# Patient Record
Sex: Female | Born: 1960
Health system: Southern US, Community
[De-identification: ages and names within clinical notes are randomized; demographics above are authoritative.]

## PROBLEM LIST (undated history)

## (undated) DIAGNOSIS — L8990 Pressure ulcer of unspecified site, unspecified stage: Secondary | ICD-10-CM

## (undated) DIAGNOSIS — E782 Mixed hyperlipidemia: Secondary | ICD-10-CM

## (undated) DIAGNOSIS — E1142 Type 2 diabetes mellitus with diabetic polyneuropathy: Secondary | ICD-10-CM

## (undated) DIAGNOSIS — G35 Multiple sclerosis: Secondary | ICD-10-CM

## (undated) DIAGNOSIS — E119 Type 2 diabetes mellitus without complications: Secondary | ICD-10-CM

## (undated) DIAGNOSIS — D649 Anemia, unspecified: Secondary | ICD-10-CM

## (undated) DIAGNOSIS — C541 Malignant neoplasm of endometrium: Secondary | ICD-10-CM

## (undated) DIAGNOSIS — K219 Gastro-esophageal reflux disease without esophagitis: Secondary | ICD-10-CM

## (undated) DIAGNOSIS — G35D Multiple sclerosis, unspecified: Secondary | ICD-10-CM

## (undated) DIAGNOSIS — R12 Heartburn: Secondary | ICD-10-CM

## (undated) DIAGNOSIS — I10 Essential (primary) hypertension: Secondary | ICD-10-CM

## (undated) DIAGNOSIS — D219 Benign neoplasm of connective and other soft tissue, unspecified: Secondary | ICD-10-CM

## (undated) DIAGNOSIS — M1990 Unspecified osteoarthritis, unspecified site: Secondary | ICD-10-CM

## (undated) HISTORY — DX: Multiple sclerosis: G35

## (undated) HISTORY — PX: TUBAL LIGATION: SHX77

## (undated) HISTORY — PX: TONSILLECTOMY: SUR1361

## (undated) HISTORY — DX: Type 2 diabetes mellitus without complications: E11.9

## (undated) HISTORY — DX: Essential (primary) hypertension: I10

## (undated) HISTORY — DX: Pressure ulcer of unspecified site, unspecified stage: L89.90

## (undated) HISTORY — DX: Unspecified osteoarthritis, unspecified site: M19.90

## (undated) HISTORY — DX: Multiple sclerosis, unspecified: G35.D

## (undated) HISTORY — PX: COLONOSCOPY: SHX174

---

## 2007-12-07 HISTORY — PX: SUPRAPUBIC CATHETER INSERTION: SUR719

## 2014-10-25 DIAGNOSIS — E119 Type 2 diabetes mellitus without complications: Secondary | ICD-10-CM | POA: Diagnosis not present

## 2014-10-25 DIAGNOSIS — L89322 Pressure ulcer of left buttock, stage 2: Secondary | ICD-10-CM | POA: Diagnosis not present

## 2014-10-25 DIAGNOSIS — I10 Essential (primary) hypertension: Secondary | ICD-10-CM | POA: Diagnosis not present

## 2014-10-25 DIAGNOSIS — Z435 Encounter for attention to cystostomy: Secondary | ICD-10-CM | POA: Diagnosis not present

## 2014-10-25 DIAGNOSIS — G35 Multiple sclerosis: Secondary | ICD-10-CM | POA: Diagnosis not present

## 2014-11-21 DIAGNOSIS — G35 Multiple sclerosis: Secondary | ICD-10-CM | POA: Diagnosis not present

## 2014-11-21 DIAGNOSIS — E119 Type 2 diabetes mellitus without complications: Secondary | ICD-10-CM | POA: Diagnosis not present

## 2014-11-21 DIAGNOSIS — Z435 Encounter for attention to cystostomy: Secondary | ICD-10-CM | POA: Diagnosis not present

## 2014-11-21 DIAGNOSIS — I10 Essential (primary) hypertension: Secondary | ICD-10-CM | POA: Diagnosis not present

## 2014-11-21 DIAGNOSIS — L89322 Pressure ulcer of left buttock, stage 2: Secondary | ICD-10-CM | POA: Diagnosis not present

## 2014-12-19 DIAGNOSIS — I10 Essential (primary) hypertension: Secondary | ICD-10-CM | POA: Diagnosis not present

## 2014-12-19 DIAGNOSIS — E119 Type 2 diabetes mellitus without complications: Secondary | ICD-10-CM | POA: Diagnosis not present

## 2014-12-19 DIAGNOSIS — L89322 Pressure ulcer of left buttock, stage 2: Secondary | ICD-10-CM | POA: Diagnosis not present

## 2014-12-19 DIAGNOSIS — G35 Multiple sclerosis: Secondary | ICD-10-CM | POA: Diagnosis not present

## 2014-12-19 DIAGNOSIS — Z435 Encounter for attention to cystostomy: Secondary | ICD-10-CM | POA: Diagnosis not present

## 2014-12-24 DIAGNOSIS — Z435 Encounter for attention to cystostomy: Secondary | ICD-10-CM | POA: Diagnosis not present

## 2014-12-24 DIAGNOSIS — L89322 Pressure ulcer of left buttock, stage 2: Secondary | ICD-10-CM | POA: Diagnosis not present

## 2014-12-24 DIAGNOSIS — Z993 Dependence on wheelchair: Secondary | ICD-10-CM | POA: Diagnosis not present

## 2014-12-24 DIAGNOSIS — Z6841 Body Mass Index (BMI) 40.0 and over, adult: Secondary | ICD-10-CM | POA: Diagnosis not present

## 2014-12-24 DIAGNOSIS — G35 Multiple sclerosis: Secondary | ICD-10-CM | POA: Diagnosis not present

## 2015-01-01 DIAGNOSIS — Z79899 Other long term (current) drug therapy: Secondary | ICD-10-CM | POA: Diagnosis not present

## 2015-01-01 DIAGNOSIS — Z5181 Encounter for therapeutic drug level monitoring: Secondary | ICD-10-CM | POA: Diagnosis not present

## 2015-01-01 DIAGNOSIS — G894 Chronic pain syndrome: Secondary | ICD-10-CM | POA: Diagnosis not present

## 2015-01-01 DIAGNOSIS — M4716 Other spondylosis with myelopathy, lumbar region: Secondary | ICD-10-CM | POA: Diagnosis not present

## 2015-01-08 DIAGNOSIS — K5900 Constipation, unspecified: Secondary | ICD-10-CM | POA: Diagnosis not present

## 2015-01-08 DIAGNOSIS — K219 Gastro-esophageal reflux disease without esophagitis: Secondary | ICD-10-CM | POA: Diagnosis not present

## 2015-01-08 DIAGNOSIS — K649 Unspecified hemorrhoids: Secondary | ICD-10-CM | POA: Diagnosis not present

## 2015-01-10 DIAGNOSIS — G35 Multiple sclerosis: Secondary | ICD-10-CM | POA: Diagnosis not present

## 2015-01-10 DIAGNOSIS — Z435 Encounter for attention to cystostomy: Secondary | ICD-10-CM | POA: Diagnosis not present

## 2015-01-10 DIAGNOSIS — E119 Type 2 diabetes mellitus without complications: Secondary | ICD-10-CM | POA: Diagnosis not present

## 2015-01-10 DIAGNOSIS — L89322 Pressure ulcer of left buttock, stage 2: Secondary | ICD-10-CM | POA: Diagnosis not present

## 2015-01-10 DIAGNOSIS — Z993 Dependence on wheelchair: Secondary | ICD-10-CM | POA: Diagnosis not present

## 2015-01-10 DIAGNOSIS — Z6841 Body Mass Index (BMI) 40.0 and over, adult: Secondary | ICD-10-CM | POA: Diagnosis not present

## 2015-01-13 DIAGNOSIS — E119 Type 2 diabetes mellitus without complications: Secondary | ICD-10-CM | POA: Diagnosis not present

## 2015-01-13 DIAGNOSIS — E785 Hyperlipidemia, unspecified: Secondary | ICD-10-CM | POA: Diagnosis not present

## 2015-01-13 DIAGNOSIS — I10 Essential (primary) hypertension: Secondary | ICD-10-CM | POA: Diagnosis not present

## 2015-01-16 DIAGNOSIS — Z6841 Body Mass Index (BMI) 40.0 and over, adult: Secondary | ICD-10-CM | POA: Diagnosis not present

## 2015-01-16 DIAGNOSIS — Z993 Dependence on wheelchair: Secondary | ICD-10-CM | POA: Diagnosis not present

## 2015-01-16 DIAGNOSIS — Z435 Encounter for attention to cystostomy: Secondary | ICD-10-CM | POA: Diagnosis not present

## 2015-01-16 DIAGNOSIS — G35 Multiple sclerosis: Secondary | ICD-10-CM | POA: Diagnosis not present

## 2015-01-16 DIAGNOSIS — L89322 Pressure ulcer of left buttock, stage 2: Secondary | ICD-10-CM | POA: Diagnosis not present

## 2015-01-27 DIAGNOSIS — E1129 Type 2 diabetes mellitus with other diabetic kidney complication: Secondary | ICD-10-CM | POA: Diagnosis not present

## 2015-01-27 DIAGNOSIS — E559 Vitamin D deficiency, unspecified: Secondary | ICD-10-CM | POA: Diagnosis not present

## 2015-01-27 DIAGNOSIS — I10 Essential (primary) hypertension: Secondary | ICD-10-CM | POA: Diagnosis not present

## 2015-01-27 DIAGNOSIS — E785 Hyperlipidemia, unspecified: Secondary | ICD-10-CM | POA: Diagnosis not present

## 2015-01-29 DIAGNOSIS — Z8669 Personal history of other diseases of the nervous system and sense organs: Secondary | ICD-10-CM | POA: Diagnosis not present

## 2015-01-29 DIAGNOSIS — I10 Essential (primary) hypertension: Secondary | ICD-10-CM | POA: Diagnosis not present

## 2015-01-29 DIAGNOSIS — E119 Type 2 diabetes mellitus without complications: Secondary | ICD-10-CM | POA: Diagnosis not present

## 2015-01-29 DIAGNOSIS — E876 Hypokalemia: Secondary | ICD-10-CM | POA: Diagnosis not present

## 2015-02-13 DIAGNOSIS — L89322 Pressure ulcer of left buttock, stage 2: Secondary | ICD-10-CM | POA: Diagnosis not present

## 2015-02-13 DIAGNOSIS — Z435 Encounter for attention to cystostomy: Secondary | ICD-10-CM | POA: Diagnosis not present

## 2015-02-13 DIAGNOSIS — Z6841 Body Mass Index (BMI) 40.0 and over, adult: Secondary | ICD-10-CM | POA: Diagnosis not present

## 2015-02-13 DIAGNOSIS — G35 Multiple sclerosis: Secondary | ICD-10-CM | POA: Diagnosis not present

## 2015-02-13 DIAGNOSIS — Z993 Dependence on wheelchair: Secondary | ICD-10-CM | POA: Diagnosis not present

## 2015-02-21 DIAGNOSIS — M791 Myalgia: Secondary | ICD-10-CM | POA: Diagnosis not present

## 2015-02-21 DIAGNOSIS — Z79899 Other long term (current) drug therapy: Secondary | ICD-10-CM | POA: Diagnosis not present

## 2015-02-21 DIAGNOSIS — G894 Chronic pain syndrome: Secondary | ICD-10-CM | POA: Diagnosis not present

## 2015-02-21 DIAGNOSIS — M4716 Other spondylosis with myelopathy, lumbar region: Secondary | ICD-10-CM | POA: Diagnosis not present

## 2015-02-22 DIAGNOSIS — Z993 Dependence on wheelchair: Secondary | ICD-10-CM | POA: Diagnosis not present

## 2015-02-22 DIAGNOSIS — Z6841 Body Mass Index (BMI) 40.0 and over, adult: Secondary | ICD-10-CM | POA: Diagnosis not present

## 2015-02-22 DIAGNOSIS — Z435 Encounter for attention to cystostomy: Secondary | ICD-10-CM | POA: Diagnosis not present

## 2015-02-22 DIAGNOSIS — G35 Multiple sclerosis: Secondary | ICD-10-CM | POA: Diagnosis not present

## 2015-02-22 DIAGNOSIS — L89322 Pressure ulcer of left buttock, stage 2: Secondary | ICD-10-CM | POA: Diagnosis not present

## 2015-02-26 DIAGNOSIS — E876 Hypokalemia: Secondary | ICD-10-CM | POA: Diagnosis not present

## 2015-03-17 DIAGNOSIS — Z993 Dependence on wheelchair: Secondary | ICD-10-CM | POA: Diagnosis not present

## 2015-03-17 DIAGNOSIS — G35 Multiple sclerosis: Secondary | ICD-10-CM | POA: Diagnosis not present

## 2015-03-17 DIAGNOSIS — L89322 Pressure ulcer of left buttock, stage 2: Secondary | ICD-10-CM | POA: Diagnosis not present

## 2015-03-17 DIAGNOSIS — Z435 Encounter for attention to cystostomy: Secondary | ICD-10-CM | POA: Diagnosis not present

## 2015-03-17 DIAGNOSIS — Z6841 Body Mass Index (BMI) 40.0 and over, adult: Secondary | ICD-10-CM | POA: Diagnosis not present

## 2015-03-19 DIAGNOSIS — Z79899 Other long term (current) drug therapy: Secondary | ICD-10-CM | POA: Diagnosis not present

## 2015-03-19 DIAGNOSIS — M791 Myalgia: Secondary | ICD-10-CM | POA: Diagnosis not present

## 2015-03-19 DIAGNOSIS — G894 Chronic pain syndrome: Secondary | ICD-10-CM | POA: Diagnosis not present

## 2015-03-19 DIAGNOSIS — M4716 Other spondylosis with myelopathy, lumbar region: Secondary | ICD-10-CM | POA: Diagnosis not present

## 2015-04-02 DIAGNOSIS — N319 Neuromuscular dysfunction of bladder, unspecified: Secondary | ICD-10-CM | POA: Diagnosis not present

## 2015-04-02 DIAGNOSIS — N3000 Acute cystitis without hematuria: Secondary | ICD-10-CM | POA: Diagnosis not present

## 2015-04-21 DIAGNOSIS — Z993 Dependence on wheelchair: Secondary | ICD-10-CM | POA: Diagnosis not present

## 2015-04-21 DIAGNOSIS — Z6841 Body Mass Index (BMI) 40.0 and over, adult: Secondary | ICD-10-CM | POA: Diagnosis not present

## 2015-04-21 DIAGNOSIS — Z435 Encounter for attention to cystostomy: Secondary | ICD-10-CM | POA: Diagnosis not present

## 2015-04-21 DIAGNOSIS — G35 Multiple sclerosis: Secondary | ICD-10-CM | POA: Diagnosis not present

## 2015-04-21 DIAGNOSIS — L89322 Pressure ulcer of left buttock, stage 2: Secondary | ICD-10-CM | POA: Diagnosis not present

## 2015-04-23 DIAGNOSIS — G35 Multiple sclerosis: Secondary | ICD-10-CM | POA: Diagnosis not present

## 2015-04-23 DIAGNOSIS — Z435 Encounter for attention to cystostomy: Secondary | ICD-10-CM | POA: Diagnosis not present

## 2015-04-23 DIAGNOSIS — Z6841 Body Mass Index (BMI) 40.0 and over, adult: Secondary | ICD-10-CM | POA: Diagnosis not present

## 2015-04-23 DIAGNOSIS — I10 Essential (primary) hypertension: Secondary | ICD-10-CM | POA: Diagnosis not present

## 2015-04-23 DIAGNOSIS — Z993 Dependence on wheelchair: Secondary | ICD-10-CM | POA: Diagnosis not present

## 2015-05-14 DIAGNOSIS — M791 Myalgia: Secondary | ICD-10-CM | POA: Diagnosis not present

## 2015-05-14 DIAGNOSIS — Z79899 Other long term (current) drug therapy: Secondary | ICD-10-CM | POA: Diagnosis not present

## 2015-05-14 DIAGNOSIS — G894 Chronic pain syndrome: Secondary | ICD-10-CM | POA: Diagnosis not present

## 2015-05-14 DIAGNOSIS — M4716 Other spondylosis with myelopathy, lumbar region: Secondary | ICD-10-CM | POA: Diagnosis not present

## 2015-05-16 DIAGNOSIS — R262 Difficulty in walking, not elsewhere classified: Secondary | ICD-10-CM | POA: Diagnosis not present

## 2015-05-16 DIAGNOSIS — I10 Essential (primary) hypertension: Secondary | ICD-10-CM | POA: Diagnosis not present

## 2015-05-16 DIAGNOSIS — E119 Type 2 diabetes mellitus without complications: Secondary | ICD-10-CM | POA: Diagnosis not present

## 2015-05-16 DIAGNOSIS — G35 Multiple sclerosis: Secondary | ICD-10-CM | POA: Diagnosis not present

## 2015-05-22 DIAGNOSIS — G35 Multiple sclerosis: Secondary | ICD-10-CM | POA: Diagnosis not present

## 2015-05-22 DIAGNOSIS — I10 Essential (primary) hypertension: Secondary | ICD-10-CM | POA: Diagnosis not present

## 2015-05-22 DIAGNOSIS — Z6841 Body Mass Index (BMI) 40.0 and over, adult: Secondary | ICD-10-CM | POA: Diagnosis not present

## 2015-05-22 DIAGNOSIS — Z993 Dependence on wheelchair: Secondary | ICD-10-CM | POA: Diagnosis not present

## 2015-05-22 DIAGNOSIS — Z435 Encounter for attention to cystostomy: Secondary | ICD-10-CM | POA: Diagnosis not present

## 2015-06-06 DIAGNOSIS — E119 Type 2 diabetes mellitus without complications: Secondary | ICD-10-CM | POA: Diagnosis not present

## 2015-06-06 DIAGNOSIS — I10 Essential (primary) hypertension: Secondary | ICD-10-CM | POA: Diagnosis not present

## 2015-06-06 DIAGNOSIS — E780 Pure hypercholesterolemia: Secondary | ICD-10-CM | POA: Diagnosis not present

## 2015-06-11 DIAGNOSIS — E780 Pure hypercholesterolemia: Secondary | ICD-10-CM | POA: Diagnosis not present

## 2015-06-11 DIAGNOSIS — I10 Essential (primary) hypertension: Secondary | ICD-10-CM | POA: Diagnosis not present

## 2015-06-11 DIAGNOSIS — E119 Type 2 diabetes mellitus without complications: Secondary | ICD-10-CM | POA: Diagnosis not present

## 2015-06-19 DIAGNOSIS — Z6841 Body Mass Index (BMI) 40.0 and over, adult: Secondary | ICD-10-CM | POA: Diagnosis not present

## 2015-06-19 DIAGNOSIS — Z993 Dependence on wheelchair: Secondary | ICD-10-CM | POA: Diagnosis not present

## 2015-06-19 DIAGNOSIS — G35 Multiple sclerosis: Secondary | ICD-10-CM | POA: Diagnosis not present

## 2015-06-19 DIAGNOSIS — I10 Essential (primary) hypertension: Secondary | ICD-10-CM | POA: Diagnosis not present

## 2015-06-19 DIAGNOSIS — Z435 Encounter for attention to cystostomy: Secondary | ICD-10-CM | POA: Diagnosis not present

## 2015-06-22 DIAGNOSIS — I10 Essential (primary) hypertension: Secondary | ICD-10-CM | POA: Diagnosis not present

## 2015-06-22 DIAGNOSIS — Z993 Dependence on wheelchair: Secondary | ICD-10-CM | POA: Diagnosis not present

## 2015-06-22 DIAGNOSIS — Z6841 Body Mass Index (BMI) 40.0 and over, adult: Secondary | ICD-10-CM | POA: Diagnosis not present

## 2015-06-22 DIAGNOSIS — Z435 Encounter for attention to cystostomy: Secondary | ICD-10-CM | POA: Diagnosis not present

## 2015-06-22 DIAGNOSIS — G35 Multiple sclerosis: Secondary | ICD-10-CM | POA: Diagnosis not present

## 2015-07-09 DIAGNOSIS — O2243 Hemorrhoids in pregnancy, third trimester: Secondary | ICD-10-CM | POA: Diagnosis not present

## 2015-07-09 DIAGNOSIS — K6289 Other specified diseases of anus and rectum: Secondary | ICD-10-CM | POA: Diagnosis not present

## 2015-07-09 DIAGNOSIS — K5900 Constipation, unspecified: Secondary | ICD-10-CM | POA: Diagnosis not present

## 2015-07-17 DIAGNOSIS — Z6841 Body Mass Index (BMI) 40.0 and over, adult: Secondary | ICD-10-CM | POA: Diagnosis not present

## 2015-07-17 DIAGNOSIS — I10 Essential (primary) hypertension: Secondary | ICD-10-CM | POA: Diagnosis not present

## 2015-07-17 DIAGNOSIS — Z435 Encounter for attention to cystostomy: Secondary | ICD-10-CM | POA: Diagnosis not present

## 2015-07-17 DIAGNOSIS — Z993 Dependence on wheelchair: Secondary | ICD-10-CM | POA: Diagnosis not present

## 2015-07-17 DIAGNOSIS — G35 Multiple sclerosis: Secondary | ICD-10-CM | POA: Diagnosis not present

## 2015-07-23 DIAGNOSIS — G894 Chronic pain syndrome: Secondary | ICD-10-CM | POA: Diagnosis not present

## 2015-07-23 DIAGNOSIS — Z79899 Other long term (current) drug therapy: Secondary | ICD-10-CM | POA: Diagnosis not present

## 2015-07-23 DIAGNOSIS — E119 Type 2 diabetes mellitus without complications: Secondary | ICD-10-CM | POA: Diagnosis not present

## 2015-07-23 DIAGNOSIS — E1140 Type 2 diabetes mellitus with diabetic neuropathy, unspecified: Secondary | ICD-10-CM | POA: Diagnosis not present

## 2015-07-23 DIAGNOSIS — M791 Myalgia: Secondary | ICD-10-CM | POA: Diagnosis not present

## 2015-07-23 DIAGNOSIS — M4716 Other spondylosis with myelopathy, lumbar region: Secondary | ICD-10-CM | POA: Diagnosis not present

## 2015-07-25 DIAGNOSIS — D508 Other iron deficiency anemias: Secondary | ICD-10-CM | POA: Diagnosis not present

## 2015-07-25 DIAGNOSIS — R262 Difficulty in walking, not elsewhere classified: Secondary | ICD-10-CM | POA: Diagnosis not present

## 2015-07-25 DIAGNOSIS — G35 Multiple sclerosis: Secondary | ICD-10-CM | POA: Diagnosis not present

## 2015-07-25 DIAGNOSIS — I10 Essential (primary) hypertension: Secondary | ICD-10-CM | POA: Diagnosis not present

## 2015-07-25 DIAGNOSIS — R5383 Other fatigue: Secondary | ICD-10-CM | POA: Diagnosis not present

## 2015-07-25 DIAGNOSIS — K589 Irritable bowel syndrome without diarrhea: Secondary | ICD-10-CM | POA: Diagnosis not present

## 2015-07-25 DIAGNOSIS — E119 Type 2 diabetes mellitus without complications: Secondary | ICD-10-CM | POA: Diagnosis not present

## 2015-07-30 DIAGNOSIS — M62838 Other muscle spasm: Secondary | ICD-10-CM | POA: Diagnosis not present

## 2015-07-30 DIAGNOSIS — M5137 Other intervertebral disc degeneration, lumbosacral region: Secondary | ICD-10-CM | POA: Diagnosis not present

## 2015-07-30 DIAGNOSIS — E785 Hyperlipidemia, unspecified: Secondary | ICD-10-CM | POA: Diagnosis not present

## 2015-07-30 DIAGNOSIS — I10 Essential (primary) hypertension: Secondary | ICD-10-CM | POA: Diagnosis not present

## 2015-07-30 DIAGNOSIS — G8389 Other specified paralytic syndromes: Secondary | ICD-10-CM | POA: Diagnosis not present

## 2015-07-30 DIAGNOSIS — E539 Vitamin B deficiency, unspecified: Secondary | ICD-10-CM | POA: Diagnosis not present

## 2015-07-30 DIAGNOSIS — E1140 Type 2 diabetes mellitus with diabetic neuropathy, unspecified: Secondary | ICD-10-CM | POA: Diagnosis not present

## 2015-07-30 DIAGNOSIS — E559 Vitamin D deficiency, unspecified: Secondary | ICD-10-CM | POA: Diagnosis not present

## 2015-08-04 DIAGNOSIS — M216X1 Other acquired deformities of right foot: Secondary | ICD-10-CM | POA: Diagnosis not present

## 2015-08-04 DIAGNOSIS — E1151 Type 2 diabetes mellitus with diabetic peripheral angiopathy without gangrene: Secondary | ICD-10-CM | POA: Diagnosis not present

## 2015-08-04 DIAGNOSIS — M216X2 Other acquired deformities of left foot: Secondary | ICD-10-CM | POA: Diagnosis not present

## 2015-08-13 DIAGNOSIS — L290 Pruritus ani: Secondary | ICD-10-CM | POA: Diagnosis not present

## 2015-08-19 DIAGNOSIS — Z993 Dependence on wheelchair: Secondary | ICD-10-CM | POA: Diagnosis not present

## 2015-08-19 DIAGNOSIS — Z6841 Body Mass Index (BMI) 40.0 and over, adult: Secondary | ICD-10-CM | POA: Diagnosis not present

## 2015-08-19 DIAGNOSIS — Z435 Encounter for attention to cystostomy: Secondary | ICD-10-CM | POA: Diagnosis not present

## 2015-08-19 DIAGNOSIS — G35 Multiple sclerosis: Secondary | ICD-10-CM | POA: Diagnosis not present

## 2015-08-19 DIAGNOSIS — I10 Essential (primary) hypertension: Secondary | ICD-10-CM | POA: Diagnosis not present

## 2015-08-21 DIAGNOSIS — Z435 Encounter for attention to cystostomy: Secondary | ICD-10-CM | POA: Diagnosis not present

## 2015-08-21 DIAGNOSIS — Z6841 Body Mass Index (BMI) 40.0 and over, adult: Secondary | ICD-10-CM | POA: Diagnosis not present

## 2015-08-21 DIAGNOSIS — E119 Type 2 diabetes mellitus without complications: Secondary | ICD-10-CM | POA: Diagnosis not present

## 2015-08-21 DIAGNOSIS — L89322 Pressure ulcer of left buttock, stage 2: Secondary | ICD-10-CM | POA: Diagnosis not present

## 2015-08-21 DIAGNOSIS — G35 Multiple sclerosis: Secondary | ICD-10-CM | POA: Diagnosis not present

## 2015-09-17 DIAGNOSIS — Z79899 Other long term (current) drug therapy: Secondary | ICD-10-CM | POA: Diagnosis not present

## 2015-09-17 DIAGNOSIS — M4716 Other spondylosis with myelopathy, lumbar region: Secondary | ICD-10-CM | POA: Diagnosis not present

## 2015-09-17 DIAGNOSIS — G894 Chronic pain syndrome: Secondary | ICD-10-CM | POA: Diagnosis not present

## 2015-09-17 DIAGNOSIS — M791 Myalgia: Secondary | ICD-10-CM | POA: Diagnosis not present

## 2015-09-18 DIAGNOSIS — Z6841 Body Mass Index (BMI) 40.0 and over, adult: Secondary | ICD-10-CM | POA: Diagnosis not present

## 2015-09-18 DIAGNOSIS — Z435 Encounter for attention to cystostomy: Secondary | ICD-10-CM | POA: Diagnosis not present

## 2015-09-18 DIAGNOSIS — G35 Multiple sclerosis: Secondary | ICD-10-CM | POA: Diagnosis not present

## 2015-09-18 DIAGNOSIS — E119 Type 2 diabetes mellitus without complications: Secondary | ICD-10-CM | POA: Diagnosis not present

## 2015-09-18 DIAGNOSIS — L89322 Pressure ulcer of left buttock, stage 2: Secondary | ICD-10-CM | POA: Diagnosis not present

## 2015-09-21 DIAGNOSIS — E119 Type 2 diabetes mellitus without complications: Secondary | ICD-10-CM | POA: Diagnosis not present

## 2015-09-21 DIAGNOSIS — G35 Multiple sclerosis: Secondary | ICD-10-CM | POA: Diagnosis not present

## 2015-09-21 DIAGNOSIS — Z6841 Body Mass Index (BMI) 40.0 and over, adult: Secondary | ICD-10-CM | POA: Diagnosis not present

## 2015-09-21 DIAGNOSIS — Z435 Encounter for attention to cystostomy: Secondary | ICD-10-CM | POA: Diagnosis not present

## 2015-09-21 DIAGNOSIS — N390 Urinary tract infection, site not specified: Secondary | ICD-10-CM | POA: Diagnosis not present

## 2015-09-21 DIAGNOSIS — L89322 Pressure ulcer of left buttock, stage 2: Secondary | ICD-10-CM | POA: Diagnosis not present

## 2015-10-14 DIAGNOSIS — E119 Type 2 diabetes mellitus without complications: Secondary | ICD-10-CM | POA: Diagnosis not present

## 2015-10-14 DIAGNOSIS — H2513 Age-related nuclear cataract, bilateral: Secondary | ICD-10-CM | POA: Diagnosis not present

## 2015-10-16 DIAGNOSIS — E119 Type 2 diabetes mellitus without complications: Secondary | ICD-10-CM | POA: Diagnosis not present

## 2015-10-16 DIAGNOSIS — G35 Multiple sclerosis: Secondary | ICD-10-CM | POA: Diagnosis not present

## 2015-10-16 DIAGNOSIS — Z435 Encounter for attention to cystostomy: Secondary | ICD-10-CM | POA: Diagnosis not present

## 2015-10-16 DIAGNOSIS — L89322 Pressure ulcer of left buttock, stage 2: Secondary | ICD-10-CM | POA: Diagnosis not present

## 2015-10-16 DIAGNOSIS — Z6841 Body Mass Index (BMI) 40.0 and over, adult: Secondary | ICD-10-CM | POA: Diagnosis not present

## 2015-10-20 DIAGNOSIS — Z435 Encounter for attention to cystostomy: Secondary | ICD-10-CM | POA: Diagnosis not present

## 2015-10-20 DIAGNOSIS — E119 Type 2 diabetes mellitus without complications: Secondary | ICD-10-CM | POA: Diagnosis not present

## 2015-10-20 DIAGNOSIS — L89322 Pressure ulcer of left buttock, stage 2: Secondary | ICD-10-CM | POA: Diagnosis not present

## 2015-10-20 DIAGNOSIS — Z6841 Body Mass Index (BMI) 40.0 and over, adult: Secondary | ICD-10-CM | POA: Diagnosis not present

## 2015-10-20 DIAGNOSIS — I10 Essential (primary) hypertension: Secondary | ICD-10-CM | POA: Diagnosis not present

## 2015-10-22 DIAGNOSIS — Z1231 Encounter for screening mammogram for malignant neoplasm of breast: Secondary | ICD-10-CM | POA: Diagnosis not present

## 2015-10-31 DIAGNOSIS — Z79899 Other long term (current) drug therapy: Secondary | ICD-10-CM | POA: Diagnosis not present

## 2015-10-31 DIAGNOSIS — I10 Essential (primary) hypertension: Secondary | ICD-10-CM | POA: Diagnosis not present

## 2015-10-31 DIAGNOSIS — J00 Acute nasopharyngitis [common cold]: Secondary | ICD-10-CM | POA: Diagnosis not present

## 2015-10-31 DIAGNOSIS — R030 Elevated blood-pressure reading, without diagnosis of hypertension: Secondary | ICD-10-CM | POA: Diagnosis not present

## 2015-11-03 DIAGNOSIS — E1140 Type 2 diabetes mellitus with diabetic neuropathy, unspecified: Secondary | ICD-10-CM | POA: Diagnosis not present

## 2015-11-10 DIAGNOSIS — E785 Hyperlipidemia, unspecified: Secondary | ICD-10-CM | POA: Diagnosis not present

## 2015-11-10 DIAGNOSIS — E1140 Type 2 diabetes mellitus with diabetic neuropathy, unspecified: Secondary | ICD-10-CM | POA: Diagnosis not present

## 2015-11-10 DIAGNOSIS — I10 Essential (primary) hypertension: Secondary | ICD-10-CM | POA: Diagnosis not present

## 2015-11-10 DIAGNOSIS — E559 Vitamin D deficiency, unspecified: Secondary | ICD-10-CM | POA: Diagnosis not present

## 2015-11-14 DIAGNOSIS — Z79899 Other long term (current) drug therapy: Secondary | ICD-10-CM | POA: Diagnosis not present

## 2015-11-14 DIAGNOSIS — M791 Myalgia: Secondary | ICD-10-CM | POA: Diagnosis not present

## 2015-11-14 DIAGNOSIS — G894 Chronic pain syndrome: Secondary | ICD-10-CM | POA: Diagnosis not present

## 2015-11-14 DIAGNOSIS — M4716 Other spondylosis with myelopathy, lumbar region: Secondary | ICD-10-CM | POA: Diagnosis not present

## 2015-11-17 DIAGNOSIS — E119 Type 2 diabetes mellitus without complications: Secondary | ICD-10-CM | POA: Diagnosis not present

## 2015-11-17 DIAGNOSIS — L89322 Pressure ulcer of left buttock, stage 2: Secondary | ICD-10-CM | POA: Diagnosis not present

## 2015-11-17 DIAGNOSIS — Z6841 Body Mass Index (BMI) 40.0 and over, adult: Secondary | ICD-10-CM | POA: Diagnosis not present

## 2015-11-17 DIAGNOSIS — Z435 Encounter for attention to cystostomy: Secondary | ICD-10-CM | POA: Diagnosis not present

## 2015-11-17 DIAGNOSIS — I10 Essential (primary) hypertension: Secondary | ICD-10-CM | POA: Diagnosis not present

## 2015-11-18 DIAGNOSIS — G8220 Paraplegia, unspecified: Secondary | ICD-10-CM | POA: Diagnosis not present

## 2015-11-18 DIAGNOSIS — Z4689 Encounter for fitting and adjustment of other specified devices: Secondary | ICD-10-CM | POA: Diagnosis not present

## 2015-12-17 DIAGNOSIS — I739 Peripheral vascular disease, unspecified: Secondary | ICD-10-CM | POA: Diagnosis not present

## 2015-12-18 DIAGNOSIS — I10 Essential (primary) hypertension: Secondary | ICD-10-CM | POA: Diagnosis not present

## 2015-12-18 DIAGNOSIS — Z435 Encounter for attention to cystostomy: Secondary | ICD-10-CM | POA: Diagnosis not present

## 2015-12-18 DIAGNOSIS — E119 Type 2 diabetes mellitus without complications: Secondary | ICD-10-CM | POA: Diagnosis not present

## 2015-12-18 DIAGNOSIS — L89322 Pressure ulcer of left buttock, stage 2: Secondary | ICD-10-CM | POA: Diagnosis not present

## 2015-12-18 DIAGNOSIS — Z6841 Body Mass Index (BMI) 40.0 and over, adult: Secondary | ICD-10-CM | POA: Diagnosis not present

## 2015-12-19 DIAGNOSIS — Z6841 Body Mass Index (BMI) 40.0 and over, adult: Secondary | ICD-10-CM | POA: Diagnosis not present

## 2015-12-19 DIAGNOSIS — I10 Essential (primary) hypertension: Secondary | ICD-10-CM | POA: Diagnosis not present

## 2015-12-19 DIAGNOSIS — L89322 Pressure ulcer of left buttock, stage 2: Secondary | ICD-10-CM | POA: Diagnosis not present

## 2015-12-19 DIAGNOSIS — Z435 Encounter for attention to cystostomy: Secondary | ICD-10-CM | POA: Diagnosis not present

## 2015-12-19 DIAGNOSIS — E119 Type 2 diabetes mellitus without complications: Secondary | ICD-10-CM | POA: Diagnosis not present

## 2016-01-07 DIAGNOSIS — Z79899 Other long term (current) drug therapy: Secondary | ICD-10-CM | POA: Diagnosis not present

## 2016-01-07 DIAGNOSIS — G894 Chronic pain syndrome: Secondary | ICD-10-CM | POA: Diagnosis not present

## 2016-01-07 DIAGNOSIS — M4716 Other spondylosis with myelopathy, lumbar region: Secondary | ICD-10-CM | POA: Diagnosis not present

## 2016-01-07 DIAGNOSIS — M791 Myalgia: Secondary | ICD-10-CM | POA: Diagnosis not present

## 2016-01-14 DIAGNOSIS — K649 Unspecified hemorrhoids: Secondary | ICD-10-CM | POA: Diagnosis not present

## 2016-01-14 DIAGNOSIS — E1140 Type 2 diabetes mellitus with diabetic neuropathy, unspecified: Secondary | ICD-10-CM | POA: Diagnosis not present

## 2016-01-14 DIAGNOSIS — G35 Multiple sclerosis: Secondary | ICD-10-CM | POA: Diagnosis not present

## 2016-01-14 DIAGNOSIS — K5900 Constipation, unspecified: Secondary | ICD-10-CM | POA: Diagnosis not present

## 2016-01-20 DIAGNOSIS — R300 Dysuria: Secondary | ICD-10-CM | POA: Diagnosis not present

## 2016-01-20 DIAGNOSIS — Z6841 Body Mass Index (BMI) 40.0 and over, adult: Secondary | ICD-10-CM | POA: Diagnosis not present

## 2016-01-20 DIAGNOSIS — Z435 Encounter for attention to cystostomy: Secondary | ICD-10-CM | POA: Diagnosis not present

## 2016-01-20 DIAGNOSIS — N390 Urinary tract infection, site not specified: Secondary | ICD-10-CM | POA: Diagnosis not present

## 2016-01-20 DIAGNOSIS — E119 Type 2 diabetes mellitus without complications: Secondary | ICD-10-CM | POA: Diagnosis not present

## 2016-01-20 DIAGNOSIS — L89322 Pressure ulcer of left buttock, stage 2: Secondary | ICD-10-CM | POA: Diagnosis not present

## 2016-01-20 DIAGNOSIS — I10 Essential (primary) hypertension: Secondary | ICD-10-CM | POA: Diagnosis not present

## 2016-01-28 DIAGNOSIS — E1140 Type 2 diabetes mellitus with diabetic neuropathy, unspecified: Secondary | ICD-10-CM | POA: Diagnosis not present

## 2016-02-04 DIAGNOSIS — M62838 Other muscle spasm: Secondary | ICD-10-CM | POA: Diagnosis not present

## 2016-02-04 DIAGNOSIS — G8389 Other specified paralytic syndromes: Secondary | ICD-10-CM | POA: Diagnosis not present

## 2016-02-04 DIAGNOSIS — E1149 Type 2 diabetes mellitus with other diabetic neurological complication: Secondary | ICD-10-CM | POA: Diagnosis not present

## 2016-02-04 DIAGNOSIS — G35 Multiple sclerosis: Secondary | ICD-10-CM | POA: Diagnosis not present

## 2016-02-13 DIAGNOSIS — E119 Type 2 diabetes mellitus without complications: Secondary | ICD-10-CM | POA: Diagnosis not present

## 2016-02-13 DIAGNOSIS — L89322 Pressure ulcer of left buttock, stage 2: Secondary | ICD-10-CM | POA: Diagnosis not present

## 2016-02-13 DIAGNOSIS — I10 Essential (primary) hypertension: Secondary | ICD-10-CM | POA: Diagnosis not present

## 2016-02-13 DIAGNOSIS — Z435 Encounter for attention to cystostomy: Secondary | ICD-10-CM | POA: Diagnosis not present

## 2016-02-13 DIAGNOSIS — Z6841 Body Mass Index (BMI) 40.0 and over, adult: Secondary | ICD-10-CM | POA: Diagnosis not present

## 2016-02-16 DIAGNOSIS — E559 Vitamin D deficiency, unspecified: Secondary | ICD-10-CM | POA: Diagnosis not present

## 2016-02-16 DIAGNOSIS — E1140 Type 2 diabetes mellitus with diabetic neuropathy, unspecified: Secondary | ICD-10-CM | POA: Diagnosis not present

## 2016-02-16 DIAGNOSIS — E785 Hyperlipidemia, unspecified: Secondary | ICD-10-CM | POA: Diagnosis not present

## 2016-02-16 DIAGNOSIS — I10 Essential (primary) hypertension: Secondary | ICD-10-CM | POA: Diagnosis not present

## 2016-02-17 DIAGNOSIS — Z6841 Body Mass Index (BMI) 40.0 and over, adult: Secondary | ICD-10-CM | POA: Diagnosis not present

## 2016-02-17 DIAGNOSIS — I10 Essential (primary) hypertension: Secondary | ICD-10-CM | POA: Diagnosis not present

## 2016-02-17 DIAGNOSIS — Z435 Encounter for attention to cystostomy: Secondary | ICD-10-CM | POA: Diagnosis not present

## 2016-02-17 DIAGNOSIS — L89321 Pressure ulcer of left buttock, stage 1: Secondary | ICD-10-CM | POA: Diagnosis not present

## 2016-02-17 DIAGNOSIS — E119 Type 2 diabetes mellitus without complications: Secondary | ICD-10-CM | POA: Diagnosis not present

## 2016-03-03 DIAGNOSIS — I10 Essential (primary) hypertension: Secondary | ICD-10-CM | POA: Diagnosis not present

## 2016-03-03 DIAGNOSIS — G35 Multiple sclerosis: Secondary | ICD-10-CM | POA: Diagnosis not present

## 2016-03-03 DIAGNOSIS — E119 Type 2 diabetes mellitus without complications: Secondary | ICD-10-CM | POA: Diagnosis not present

## 2016-03-03 DIAGNOSIS — R300 Dysuria: Secondary | ICD-10-CM | POA: Diagnosis not present

## 2016-03-03 DIAGNOSIS — D649 Anemia, unspecified: Secondary | ICD-10-CM | POA: Diagnosis not present

## 2016-03-04 DIAGNOSIS — N390 Urinary tract infection, site not specified: Secondary | ICD-10-CM | POA: Diagnosis not present

## 2016-03-15 DIAGNOSIS — L89321 Pressure ulcer of left buttock, stage 1: Secondary | ICD-10-CM | POA: Diagnosis not present

## 2016-03-15 DIAGNOSIS — I10 Essential (primary) hypertension: Secondary | ICD-10-CM | POA: Diagnosis not present

## 2016-03-15 DIAGNOSIS — Z6841 Body Mass Index (BMI) 40.0 and over, adult: Secondary | ICD-10-CM | POA: Diagnosis not present

## 2016-03-15 DIAGNOSIS — G35 Multiple sclerosis: Secondary | ICD-10-CM | POA: Diagnosis not present

## 2016-03-15 DIAGNOSIS — Z435 Encounter for attention to cystostomy: Secondary | ICD-10-CM | POA: Diagnosis not present

## 2016-03-15 DIAGNOSIS — E119 Type 2 diabetes mellitus without complications: Secondary | ICD-10-CM | POA: Diagnosis not present

## 2016-03-18 DIAGNOSIS — I10 Essential (primary) hypertension: Secondary | ICD-10-CM | POA: Diagnosis not present

## 2016-03-18 DIAGNOSIS — E876 Hypokalemia: Secondary | ICD-10-CM | POA: Diagnosis not present

## 2016-03-18 DIAGNOSIS — E7800 Pure hypercholesterolemia, unspecified: Secondary | ICD-10-CM | POA: Diagnosis not present

## 2016-03-18 DIAGNOSIS — J302 Other seasonal allergic rhinitis: Secondary | ICD-10-CM | POA: Diagnosis not present

## 2016-03-18 DIAGNOSIS — G35 Multiple sclerosis: Secondary | ICD-10-CM | POA: Diagnosis not present

## 2016-03-18 DIAGNOSIS — E119 Type 2 diabetes mellitus without complications: Secondary | ICD-10-CM | POA: Diagnosis not present

## 2016-03-24 DIAGNOSIS — G894 Chronic pain syndrome: Secondary | ICD-10-CM | POA: Diagnosis not present

## 2016-03-24 DIAGNOSIS — M4716 Other spondylosis with myelopathy, lumbar region: Secondary | ICD-10-CM | POA: Diagnosis not present

## 2016-03-24 DIAGNOSIS — M791 Myalgia: Secondary | ICD-10-CM | POA: Diagnosis not present

## 2016-03-24 DIAGNOSIS — Z79899 Other long term (current) drug therapy: Secondary | ICD-10-CM | POA: Diagnosis not present

## 2016-03-31 DIAGNOSIS — I739 Peripheral vascular disease, unspecified: Secondary | ICD-10-CM | POA: Diagnosis not present

## 2016-04-16 DIAGNOSIS — E119 Type 2 diabetes mellitus without complications: Secondary | ICD-10-CM | POA: Diagnosis not present

## 2016-04-16 DIAGNOSIS — I10 Essential (primary) hypertension: Secondary | ICD-10-CM | POA: Diagnosis not present

## 2016-04-16 DIAGNOSIS — Z435 Encounter for attention to cystostomy: Secondary | ICD-10-CM | POA: Diagnosis not present

## 2016-04-16 DIAGNOSIS — Z6841 Body Mass Index (BMI) 40.0 and over, adult: Secondary | ICD-10-CM | POA: Diagnosis not present

## 2016-04-16 DIAGNOSIS — L89321 Pressure ulcer of left buttock, stage 1: Secondary | ICD-10-CM | POA: Diagnosis not present

## 2016-04-17 DIAGNOSIS — I10 Essential (primary) hypertension: Secondary | ICD-10-CM | POA: Diagnosis not present

## 2016-04-17 DIAGNOSIS — E119 Type 2 diabetes mellitus without complications: Secondary | ICD-10-CM | POA: Diagnosis not present

## 2016-04-17 DIAGNOSIS — Z435 Encounter for attention to cystostomy: Secondary | ICD-10-CM | POA: Diagnosis not present

## 2016-04-17 DIAGNOSIS — L89321 Pressure ulcer of left buttock, stage 1: Secondary | ICD-10-CM | POA: Diagnosis not present

## 2016-04-17 DIAGNOSIS — Z6841 Body Mass Index (BMI) 40.0 and over, adult: Secondary | ICD-10-CM | POA: Diagnosis not present

## 2016-04-20 DIAGNOSIS — N319 Neuromuscular dysfunction of bladder, unspecified: Secondary | ICD-10-CM | POA: Diagnosis not present

## 2016-04-20 DIAGNOSIS — N3941 Urge incontinence: Secondary | ICD-10-CM | POA: Diagnosis not present

## 2016-04-20 DIAGNOSIS — N390 Urinary tract infection, site not specified: Secondary | ICD-10-CM | POA: Diagnosis not present

## 2016-04-23 DIAGNOSIS — I10 Essential (primary) hypertension: Secondary | ICD-10-CM | POA: Diagnosis not present

## 2016-04-23 DIAGNOSIS — J3089 Other allergic rhinitis: Secondary | ICD-10-CM | POA: Diagnosis not present

## 2016-04-23 DIAGNOSIS — E876 Hypokalemia: Secondary | ICD-10-CM | POA: Diagnosis not present

## 2016-04-23 DIAGNOSIS — G35 Multiple sclerosis: Secondary | ICD-10-CM | POA: Diagnosis not present

## 2016-05-07 DIAGNOSIS — Z435 Encounter for attention to cystostomy: Secondary | ICD-10-CM | POA: Diagnosis not present

## 2016-05-07 DIAGNOSIS — E119 Type 2 diabetes mellitus without complications: Secondary | ICD-10-CM | POA: Diagnosis not present

## 2016-05-07 DIAGNOSIS — L89321 Pressure ulcer of left buttock, stage 1: Secondary | ICD-10-CM | POA: Diagnosis not present

## 2016-05-07 DIAGNOSIS — Z6841 Body Mass Index (BMI) 40.0 and over, adult: Secondary | ICD-10-CM | POA: Diagnosis not present

## 2016-05-07 DIAGNOSIS — I10 Essential (primary) hypertension: Secondary | ICD-10-CM | POA: Diagnosis not present

## 2016-05-18 DIAGNOSIS — E119 Type 2 diabetes mellitus without complications: Secondary | ICD-10-CM | POA: Diagnosis not present

## 2016-05-18 DIAGNOSIS — E1140 Type 2 diabetes mellitus with diabetic neuropathy, unspecified: Secondary | ICD-10-CM | POA: Diagnosis not present

## 2016-05-18 DIAGNOSIS — I10 Essential (primary) hypertension: Secondary | ICD-10-CM | POA: Diagnosis not present

## 2016-05-18 DIAGNOSIS — Z6841 Body Mass Index (BMI) 40.0 and over, adult: Secondary | ICD-10-CM | POA: Diagnosis not present

## 2016-05-18 DIAGNOSIS — Z435 Encounter for attention to cystostomy: Secondary | ICD-10-CM | POA: Diagnosis not present

## 2016-05-18 DIAGNOSIS — L89321 Pressure ulcer of left buttock, stage 1: Secondary | ICD-10-CM | POA: Diagnosis not present

## 2016-05-19 DIAGNOSIS — M791 Myalgia: Secondary | ICD-10-CM | POA: Diagnosis not present

## 2016-05-19 DIAGNOSIS — E1140 Type 2 diabetes mellitus with diabetic neuropathy, unspecified: Secondary | ICD-10-CM | POA: Diagnosis not present

## 2016-05-19 DIAGNOSIS — E559 Vitamin D deficiency, unspecified: Secondary | ICD-10-CM | POA: Diagnosis not present

## 2016-05-19 DIAGNOSIS — M4716 Other spondylosis with myelopathy, lumbar region: Secondary | ICD-10-CM | POA: Diagnosis not present

## 2016-05-19 DIAGNOSIS — G894 Chronic pain syndrome: Secondary | ICD-10-CM | POA: Diagnosis not present

## 2016-05-19 DIAGNOSIS — I10 Essential (primary) hypertension: Secondary | ICD-10-CM | POA: Diagnosis not present

## 2016-05-19 DIAGNOSIS — E785 Hyperlipidemia, unspecified: Secondary | ICD-10-CM | POA: Diagnosis not present

## 2016-05-19 DIAGNOSIS — Z79899 Other long term (current) drug therapy: Secondary | ICD-10-CM | POA: Diagnosis not present

## 2016-05-31 DIAGNOSIS — N390 Urinary tract infection, site not specified: Secondary | ICD-10-CM | POA: Diagnosis not present

## 2016-05-31 DIAGNOSIS — R300 Dysuria: Secondary | ICD-10-CM | POA: Diagnosis not present

## 2016-06-02 DIAGNOSIS — K649 Unspecified hemorrhoids: Secondary | ICD-10-CM | POA: Diagnosis not present

## 2016-06-02 DIAGNOSIS — K589 Irritable bowel syndrome without diarrhea: Secondary | ICD-10-CM | POA: Diagnosis not present

## 2016-06-02 DIAGNOSIS — K5900 Constipation, unspecified: Secondary | ICD-10-CM | POA: Diagnosis not present

## 2016-06-15 DIAGNOSIS — L89321 Pressure ulcer of left buttock, stage 1: Secondary | ICD-10-CM | POA: Diagnosis not present

## 2016-06-15 DIAGNOSIS — Z435 Encounter for attention to cystostomy: Secondary | ICD-10-CM | POA: Diagnosis not present

## 2016-06-15 DIAGNOSIS — I10 Essential (primary) hypertension: Secondary | ICD-10-CM | POA: Diagnosis not present

## 2016-06-15 DIAGNOSIS — Z6841 Body Mass Index (BMI) 40.0 and over, adult: Secondary | ICD-10-CM | POA: Diagnosis not present

## 2016-06-15 DIAGNOSIS — E119 Type 2 diabetes mellitus without complications: Secondary | ICD-10-CM | POA: Diagnosis not present

## 2016-06-16 DIAGNOSIS — Z6841 Body Mass Index (BMI) 40.0 and over, adult: Secondary | ICD-10-CM | POA: Diagnosis not present

## 2016-06-16 DIAGNOSIS — Z435 Encounter for attention to cystostomy: Secondary | ICD-10-CM | POA: Diagnosis not present

## 2016-06-16 DIAGNOSIS — E119 Type 2 diabetes mellitus without complications: Secondary | ICD-10-CM | POA: Diagnosis not present

## 2016-06-16 DIAGNOSIS — L89322 Pressure ulcer of left buttock, stage 2: Secondary | ICD-10-CM | POA: Diagnosis not present

## 2016-06-16 DIAGNOSIS — N898 Other specified noninflammatory disorders of vagina: Secondary | ICD-10-CM | POA: Diagnosis not present

## 2016-06-16 DIAGNOSIS — I10 Essential (primary) hypertension: Secondary | ICD-10-CM | POA: Diagnosis not present

## 2016-06-17 DIAGNOSIS — I10 Essential (primary) hypertension: Secondary | ICD-10-CM | POA: Diagnosis not present

## 2016-06-17 DIAGNOSIS — R300 Dysuria: Secondary | ICD-10-CM | POA: Diagnosis not present

## 2016-06-17 DIAGNOSIS — L89322 Pressure ulcer of left buttock, stage 2: Secondary | ICD-10-CM | POA: Diagnosis not present

## 2016-06-17 DIAGNOSIS — E119 Type 2 diabetes mellitus without complications: Secondary | ICD-10-CM | POA: Diagnosis not present

## 2016-06-17 DIAGNOSIS — N390 Urinary tract infection, site not specified: Secondary | ICD-10-CM | POA: Diagnosis not present

## 2016-06-17 DIAGNOSIS — Z435 Encounter for attention to cystostomy: Secondary | ICD-10-CM | POA: Diagnosis not present

## 2016-06-17 DIAGNOSIS — Z6841 Body Mass Index (BMI) 40.0 and over, adult: Secondary | ICD-10-CM | POA: Diagnosis not present

## 2016-07-05 DIAGNOSIS — R102 Pelvic and perineal pain: Secondary | ICD-10-CM | POA: Diagnosis not present

## 2016-07-05 DIAGNOSIS — N3941 Urge incontinence: Secondary | ICD-10-CM | POA: Diagnosis not present

## 2016-07-05 DIAGNOSIS — N319 Neuromuscular dysfunction of bladder, unspecified: Secondary | ICD-10-CM | POA: Diagnosis not present

## 2016-07-05 DIAGNOSIS — N390 Urinary tract infection, site not specified: Secondary | ICD-10-CM | POA: Diagnosis not present

## 2016-07-15 DIAGNOSIS — Z6841 Body Mass Index (BMI) 40.0 and over, adult: Secondary | ICD-10-CM | POA: Diagnosis not present

## 2016-07-15 DIAGNOSIS — I10 Essential (primary) hypertension: Secondary | ICD-10-CM | POA: Diagnosis not present

## 2016-07-15 DIAGNOSIS — L89322 Pressure ulcer of left buttock, stage 2: Secondary | ICD-10-CM | POA: Diagnosis not present

## 2016-07-15 DIAGNOSIS — E119 Type 2 diabetes mellitus without complications: Secondary | ICD-10-CM | POA: Diagnosis not present

## 2016-07-15 DIAGNOSIS — Z435 Encounter for attention to cystostomy: Secondary | ICD-10-CM | POA: Diagnosis not present

## 2016-07-16 DIAGNOSIS — M791 Myalgia: Secondary | ICD-10-CM | POA: Diagnosis not present

## 2016-07-16 DIAGNOSIS — G894 Chronic pain syndrome: Secondary | ICD-10-CM | POA: Diagnosis not present

## 2016-07-16 DIAGNOSIS — Z79899 Other long term (current) drug therapy: Secondary | ICD-10-CM | POA: Diagnosis not present

## 2016-07-16 DIAGNOSIS — M4716 Other spondylosis with myelopathy, lumbar region: Secondary | ICD-10-CM | POA: Diagnosis not present

## 2016-07-20 DIAGNOSIS — I10 Essential (primary) hypertension: Secondary | ICD-10-CM | POA: Diagnosis not present

## 2016-07-20 DIAGNOSIS — E7800 Pure hypercholesterolemia, unspecified: Secondary | ICD-10-CM | POA: Diagnosis not present

## 2016-07-20 DIAGNOSIS — Z435 Encounter for attention to cystostomy: Secondary | ICD-10-CM | POA: Diagnosis not present

## 2016-07-20 DIAGNOSIS — E119 Type 2 diabetes mellitus without complications: Secondary | ICD-10-CM | POA: Diagnosis not present

## 2016-07-20 DIAGNOSIS — L89322 Pressure ulcer of left buttock, stage 2: Secondary | ICD-10-CM | POA: Diagnosis not present

## 2016-07-20 DIAGNOSIS — Z6841 Body Mass Index (BMI) 40.0 and over, adult: Secondary | ICD-10-CM | POA: Diagnosis not present

## 2016-07-21 DIAGNOSIS — D259 Leiomyoma of uterus, unspecified: Secondary | ICD-10-CM | POA: Diagnosis not present

## 2016-07-21 DIAGNOSIS — N852 Hypertrophy of uterus: Secondary | ICD-10-CM | POA: Diagnosis not present

## 2016-07-21 DIAGNOSIS — N319 Neuromuscular dysfunction of bladder, unspecified: Secondary | ICD-10-CM | POA: Diagnosis not present

## 2016-07-21 DIAGNOSIS — N390 Urinary tract infection, site not specified: Secondary | ICD-10-CM | POA: Diagnosis not present

## 2016-07-21 DIAGNOSIS — R311 Benign essential microscopic hematuria: Secondary | ICD-10-CM | POA: Diagnosis not present

## 2016-07-26 DIAGNOSIS — E119 Type 2 diabetes mellitus without complications: Secondary | ICD-10-CM | POA: Diagnosis not present

## 2016-07-26 DIAGNOSIS — G35 Multiple sclerosis: Secondary | ICD-10-CM | POA: Diagnosis not present

## 2016-07-26 DIAGNOSIS — I10 Essential (primary) hypertension: Secondary | ICD-10-CM | POA: Diagnosis not present

## 2016-07-26 DIAGNOSIS — D649 Anemia, unspecified: Secondary | ICD-10-CM | POA: Diagnosis not present

## 2016-07-26 DIAGNOSIS — K219 Gastro-esophageal reflux disease without esophagitis: Secondary | ICD-10-CM | POA: Diagnosis not present

## 2016-07-30 DIAGNOSIS — R300 Dysuria: Secondary | ICD-10-CM | POA: Diagnosis not present

## 2016-07-30 DIAGNOSIS — Z435 Encounter for attention to cystostomy: Secondary | ICD-10-CM | POA: Diagnosis not present

## 2016-07-30 DIAGNOSIS — Z6841 Body Mass Index (BMI) 40.0 and over, adult: Secondary | ICD-10-CM | POA: Diagnosis not present

## 2016-07-30 DIAGNOSIS — L89322 Pressure ulcer of left buttock, stage 2: Secondary | ICD-10-CM | POA: Diagnosis not present

## 2016-07-30 DIAGNOSIS — E119 Type 2 diabetes mellitus without complications: Secondary | ICD-10-CM | POA: Diagnosis not present

## 2016-07-30 DIAGNOSIS — N390 Urinary tract infection, site not specified: Secondary | ICD-10-CM | POA: Diagnosis not present

## 2016-07-30 DIAGNOSIS — I10 Essential (primary) hypertension: Secondary | ICD-10-CM | POA: Diagnosis not present

## 2016-08-04 DIAGNOSIS — N319 Neuromuscular dysfunction of bladder, unspecified: Secondary | ICD-10-CM | POA: Diagnosis not present

## 2016-08-04 DIAGNOSIS — N390 Urinary tract infection, site not specified: Secondary | ICD-10-CM | POA: Diagnosis not present

## 2016-08-04 DIAGNOSIS — I739 Peripheral vascular disease, unspecified: Secondary | ICD-10-CM | POA: Diagnosis not present

## 2016-08-12 DIAGNOSIS — I10 Essential (primary) hypertension: Secondary | ICD-10-CM | POA: Diagnosis not present

## 2016-08-12 DIAGNOSIS — L89322 Pressure ulcer of left buttock, stage 2: Secondary | ICD-10-CM | POA: Diagnosis not present

## 2016-08-12 DIAGNOSIS — E119 Type 2 diabetes mellitus without complications: Secondary | ICD-10-CM | POA: Diagnosis not present

## 2016-08-12 DIAGNOSIS — Z6841 Body Mass Index (BMI) 40.0 and over, adult: Secondary | ICD-10-CM | POA: Diagnosis not present

## 2016-08-12 DIAGNOSIS — Z435 Encounter for attention to cystostomy: Secondary | ICD-10-CM | POA: Diagnosis not present

## 2016-08-15 DIAGNOSIS — E119 Type 2 diabetes mellitus without complications: Secondary | ICD-10-CM | POA: Diagnosis not present

## 2016-08-15 DIAGNOSIS — Z6841 Body Mass Index (BMI) 40.0 and over, adult: Secondary | ICD-10-CM | POA: Diagnosis not present

## 2016-08-15 DIAGNOSIS — Z435 Encounter for attention to cystostomy: Secondary | ICD-10-CM | POA: Diagnosis not present

## 2016-08-15 DIAGNOSIS — L89312 Pressure ulcer of right buttock, stage 2: Secondary | ICD-10-CM | POA: Diagnosis not present

## 2016-08-15 DIAGNOSIS — I10 Essential (primary) hypertension: Secondary | ICD-10-CM | POA: Diagnosis not present

## 2016-08-20 DIAGNOSIS — Z01419 Encounter for gynecological examination (general) (routine) without abnormal findings: Secondary | ICD-10-CM | POA: Diagnosis not present

## 2016-08-20 DIAGNOSIS — Z124 Encounter for screening for malignant neoplasm of cervix: Secondary | ICD-10-CM | POA: Diagnosis not present

## 2016-08-25 DIAGNOSIS — D509 Iron deficiency anemia, unspecified: Secondary | ICD-10-CM | POA: Diagnosis not present

## 2016-08-25 DIAGNOSIS — K5900 Constipation, unspecified: Secondary | ICD-10-CM | POA: Diagnosis not present

## 2016-08-25 DIAGNOSIS — R110 Nausea: Secondary | ICD-10-CM | POA: Diagnosis not present

## 2016-08-25 DIAGNOSIS — K219 Gastro-esophageal reflux disease without esophagitis: Secondary | ICD-10-CM | POA: Diagnosis not present

## 2016-08-27 DIAGNOSIS — E119 Type 2 diabetes mellitus without complications: Secondary | ICD-10-CM | POA: Diagnosis not present

## 2016-08-27 DIAGNOSIS — Z6841 Body Mass Index (BMI) 40.0 and over, adult: Secondary | ICD-10-CM | POA: Diagnosis not present

## 2016-08-27 DIAGNOSIS — I10 Essential (primary) hypertension: Secondary | ICD-10-CM | POA: Diagnosis not present

## 2016-08-27 DIAGNOSIS — Z435 Encounter for attention to cystostomy: Secondary | ICD-10-CM | POA: Diagnosis not present

## 2016-08-27 DIAGNOSIS — L89312 Pressure ulcer of right buttock, stage 2: Secondary | ICD-10-CM | POA: Diagnosis not present

## 2016-09-09 DIAGNOSIS — Z6841 Body Mass Index (BMI) 40.0 and over, adult: Secondary | ICD-10-CM | POA: Diagnosis not present

## 2016-09-09 DIAGNOSIS — L89312 Pressure ulcer of right buttock, stage 2: Secondary | ICD-10-CM | POA: Diagnosis not present

## 2016-09-09 DIAGNOSIS — E119 Type 2 diabetes mellitus without complications: Secondary | ICD-10-CM | POA: Diagnosis not present

## 2016-09-09 DIAGNOSIS — Z435 Encounter for attention to cystostomy: Secondary | ICD-10-CM | POA: Diagnosis not present

## 2016-09-09 DIAGNOSIS — I10 Essential (primary) hypertension: Secondary | ICD-10-CM | POA: Diagnosis not present

## 2016-09-15 DIAGNOSIS — M791 Myalgia: Secondary | ICD-10-CM | POA: Diagnosis not present

## 2016-09-15 DIAGNOSIS — G894 Chronic pain syndrome: Secondary | ICD-10-CM | POA: Diagnosis not present

## 2016-09-15 DIAGNOSIS — M4716 Other spondylosis with myelopathy, lumbar region: Secondary | ICD-10-CM | POA: Diagnosis not present

## 2016-09-15 DIAGNOSIS — Z79899 Other long term (current) drug therapy: Secondary | ICD-10-CM | POA: Diagnosis not present

## 2016-09-16 DIAGNOSIS — E1140 Type 2 diabetes mellitus with diabetic neuropathy, unspecified: Secondary | ICD-10-CM | POA: Diagnosis not present

## 2016-09-16 DIAGNOSIS — Z435 Encounter for attention to cystostomy: Secondary | ICD-10-CM | POA: Diagnosis not present

## 2016-09-16 DIAGNOSIS — I10 Essential (primary) hypertension: Secondary | ICD-10-CM | POA: Diagnosis not present

## 2016-09-16 DIAGNOSIS — Z6841 Body Mass Index (BMI) 40.0 and over, adult: Secondary | ICD-10-CM | POA: Diagnosis not present

## 2016-09-16 DIAGNOSIS — E119 Type 2 diabetes mellitus without complications: Secondary | ICD-10-CM | POA: Diagnosis not present

## 2016-09-16 DIAGNOSIS — L89312 Pressure ulcer of right buttock, stage 2: Secondary | ICD-10-CM | POA: Diagnosis not present

## 2016-09-22 DIAGNOSIS — E785 Hyperlipidemia, unspecified: Secondary | ICD-10-CM | POA: Diagnosis not present

## 2016-09-22 DIAGNOSIS — E559 Vitamin D deficiency, unspecified: Secondary | ICD-10-CM | POA: Diagnosis not present

## 2016-09-22 DIAGNOSIS — E1140 Type 2 diabetes mellitus with diabetic neuropathy, unspecified: Secondary | ICD-10-CM | POA: Diagnosis not present

## 2016-09-22 DIAGNOSIS — I10 Essential (primary) hypertension: Secondary | ICD-10-CM | POA: Diagnosis not present

## 2016-10-20 ENCOUNTER — Ambulatory Visit (INDEPENDENT_AMBULATORY_CARE_PROVIDER_SITE_OTHER): Payer: Medicare Other | Admitting: Family Medicine

## 2016-10-20 ENCOUNTER — Encounter: Payer: Self-pay | Admitting: Family Medicine

## 2016-10-20 VITALS — BP 126/82 | HR 95 | Temp 98.4°F | Resp 18

## 2016-10-20 DIAGNOSIS — Z9289 Personal history of other medical treatment: Secondary | ICD-10-CM | POA: Diagnosis not present

## 2016-10-20 DIAGNOSIS — Z978 Presence of other specified devices: Secondary | ICD-10-CM

## 2016-10-20 DIAGNOSIS — M150 Primary generalized (osteo)arthritis: Secondary | ICD-10-CM

## 2016-10-20 DIAGNOSIS — I10 Essential (primary) hypertension: Secondary | ICD-10-CM

## 2016-10-20 DIAGNOSIS — M159 Polyosteoarthritis, unspecified: Secondary | ICD-10-CM

## 2016-10-20 DIAGNOSIS — Z79891 Long term (current) use of opiate analgesic: Secondary | ICD-10-CM | POA: Diagnosis not present

## 2016-10-20 DIAGNOSIS — Z960 Presence of urogenital implants: Secondary | ICD-10-CM

## 2016-10-20 DIAGNOSIS — M8949 Other hypertrophic osteoarthropathy, multiple sites: Secondary | ICD-10-CM

## 2016-10-20 DIAGNOSIS — G35 Multiple sclerosis: Secondary | ICD-10-CM

## 2016-10-20 DIAGNOSIS — K5901 Slow transit constipation: Secondary | ICD-10-CM

## 2016-10-20 DIAGNOSIS — R8271 Bacteriuria: Secondary | ICD-10-CM

## 2016-10-20 DIAGNOSIS — E118 Type 2 diabetes mellitus with unspecified complications: Secondary | ICD-10-CM | POA: Diagnosis not present

## 2016-10-20 LAB — POCT URINALYSIS DIP (MANUAL ENTRY)
BILIRUBIN UA: NEGATIVE
GLUCOSE UA: NEGATIVE
Ketones, POC UA: NEGATIVE
Nitrite, UA: POSITIVE — AB
Protein Ur, POC: 30 — AB
SPEC GRAV UA: 1.015
Urobilinogen, UA: 0.2
pH, UA: 5.5

## 2016-10-20 MED ORDER — CIPROFLOXACIN HCL 500 MG PO TABS: 500.0000 mg | ORAL_TABLET | Freq: Two times a day (BID) | ORAL | 0 refills | Status: DC

## 2016-10-20 NOTE — Patient Instructions (Addendum)
IF you received an x-ray today, you will receive an invoice from [facility]. Please contact [facility] at [phone] with questions or concerns regarding your invoice.   IF you received labwork today, you will receive an invoice from [employer]. Please contact [facility] at [phone] with questions or concerns regarding your invoice.   Our billing staff will not be able to assist you with questions regarding bills from these companies.  You will be contacted with the lab results as soon as they are available. The fastest way to get your results is to activate your My Chart account. Instructions are located on the last page of this paperwork. If you have not heard from [place] regarding the results in 2 weeks, please contact this office.     Osteoarthritis Osteoarthritis is a type of arthritis that affects tissue that covers the ends of bones in joints (cartilage). Cartilage acts as a cushion between the bones and helps them move smoothly. Osteoarthritis results when cartilage in the joints gets worn down. Osteoarthritis is sometimes called "wear and tear" arthritis. Osteoarthritis is the most common form of arthritis. It often occurs in older people. It is a condition that gets worse over time (a progressive condition). Joints that are most often affected by this condition are in:  Fingers.  Toes.  Hips.  Knees.  Spine, including neck and lower back. What are the causes? This condition is caused by age-related wearing down of cartilage that covers the ends of bones. What increases the risk? The following factors may make you more likely to develop this condition:  Older age.  Being overweight or obese.  Overuse of joints, such as in athletes.  Past injury of a joint.  Past surgery on a joint.  Family history of osteoarthritis. What are the signs or symptoms? The main symptoms of this condition are pain, swelling, and  stiffness in the joint. The joint may lose its shape over time. Small pieces of bone or cartilage may break off and float inside of the joint, which may cause more pain and damage to the joint. Small deposits of bone (osteophytes) may grow on the edges of the joint. Other symptoms may include:  A grating or scraping feeling inside the joint when you move it.  Popping or creaking sounds when you move. Symptoms may affect one or more joints. Osteoarthritis in a major joint, such as your knee or hip, can make it painful to walk or exercise. If you have osteoarthritis in your hands, you might not be able to grip items, twist your hand, or control small movements of your hands and fingers (fine motor skills). How is this diagnosed? This condition may be diagnosed based on:  Your medical history.  A physical exam.  Your symptoms.  X-rays of the affected joint(s).  Blood tests to rule out other types of arthritis. How is this treated? There is no cure for this condition, but treatment can help to control pain and improve joint function. Treatment plans may include:  A prescribed exercise program that allows for rest and joint relief. You may work with a physical therapist.  A weight control plan.  Pain relief techniques, such as:  Applying heat and cold to the joint.  Electric pulses delivered to nerve endings under the skin (transcutaneous electrical nerve stimulation, or TENS).  Massage.  Certain nutritional supplements.  NSAIDs or prescription medicines to help relieve pain.  Medicine to help relieve pain and inflammation (corticosteroids). This  can be given by mouth (orally) or as an injection.  Assistive devices, such as a brace, wrap, splint, specialized glove, or cane.  Surgery, such as:  An osteotomy. This is done to reposition the bones and relieve pain or to remove loose pieces of bone and cartilage.  Joint replacement surgery. You may need this surgery if you have  very bad (advanced) osteoarthritis. Follow these instructions at home: Activity  Rest your affected joints as directed by your health care provider.  Do not drive or use heavy machinery while taking prescription pain medicine.  Exercise as directed. Your health care provider or physical therapist may recommend specific types of exercise, such as:  Strengthening exercises. These are done to strengthen the muscles that support joints that are affected by arthritis. They can be performed with weights or with exercise bands to add resistance.  Aerobic activities. These are exercises, such as brisk walking or water aerobics, that get your heart pumping.  Range-of-motion activities. These keep your joints easy to move.  Balance and agility exercises. Managing pain, stiffness, and swelling  If directed, apply heat to the affected area as often as told by your health care provider. Use the heat source that your health care provider recommends, such as a moist heat pack or a heating pad.  If you have a removable assistive device, remove it as told by your health care provider.  Place a towel between your skin and the heat source. If your health care provider tells you to keep the assistive device on while you apply heat, place a towel between the assistive device and the heat source.  Leave the heat on for 20-30 minutes.  Remove the heat if your skin turns bright red. This is especially important if you are unable to feel pain, heat, or cold. You may have a greater risk of getting burned.  If directed, put ice on the affected joint:  If you have a removable assistive device, remove it as told by your health care provider.  Put ice in a plastic bag.  Place a towel between your skin and the bag. If your health care provider tells you to keep the assistive device on during icing, place a towel between the assistive device and the bag.  Leave the ice on for 20 minutes, 2-3 times a  day. General instructions  Take over-the-counter and prescription medicines only as told by your health care provider.  Maintain a healthy weight. Follow instructions from your health care provider for weight control. These may include dietary restrictions.  Do not use any products that contain nicotine or tobacco, such as cigarettes and e-cigarettes. These can delay bone healing. If you need help quitting, ask your health care provider.  Use assistive devices as directed by your health care provider.  Keep all follow-up visits as told by your health care provider. This is important. Where to find more information:  [employer] of Arthritis and Musculoskeletal and Skin Diseases: www.niams.SouthExposed.es  [employer] on Aging: http://kim-miller.com/  American College of Rheumatology: www.rheumatology.org Contact a health care provider if:  Your skin turns red.  You develop a rash.  You have pain that gets worse.  You have a fever along with joint or muscle aches. Get help right away if:  You lose a lot of weight.  You suddenly lose your appetite.  You have night sweats. Summary  Osteoarthritis is a type of arthritis that affects tissue covering the ends of bones in joints (cartilage).  This  condition is caused by age-related wearing down of cartilage that covers the ends of bones.  The main symptom of this condition is pain, swelling, and stiffness in the joint.  There is no cure for this condition, but treatment can help to control pain and improve joint function. This information is not intended to replace advice given to you by your health care provider. Make sure you discuss any questions you have with your health care provider. Document Released: 11/22/2005 Document Revised: 07/26/2016 Document Reviewed: 07/26/2016 Elsevier Interactive Patient Education  2017 [employer].

## 2016-10-20 NOTE — Progress Notes (Signed)
Chief Complaint  Patient presents with  . CATH CHANGE    HAVENT CHNAGED SINCE OCTOBER    HPI   Pt is here to establish care She moved from [place], [place] She was being cared for by [facility]  She has a history of Osteoarthritis, Type 2 diabetes, essential hypertension, and Multiple Sclerosis diagnosed in 2006 and mobility.   Multiple Sclerosis MS diagnosed in 2006 she has a bladder catheter since 2007 due to mobility issues  She reports that she has been confined to a wheel chair since 2008. She takes an injection for her MS every other day  She was under care of Neurology. She reports that her last flare is in 2007. She reports that she has a catheter that 20/30.  Indwelling Catheter She reports that she had home health nurse would change her catheter every 3 weeks She reports that does have occasional UTIs Last change was October 8.  It was supposed to be changed a week ago.  Essential Hypertension For hypertension she takes HCTZ  She reports that her blood pressures have been well controlled on monotherapy She sticks to a DASH diet She denies chest pains palpitations or shortness of breath  Diabetes type 2 She has been diagnosed in 2007 She takes glipizide and metformin She denies current steroid use for her MS She reports that her blood glucose has been great Her fasting glucose today was 115 She denies recent hypoglycemic episodes and reports that maybe once a month Her last a1c was 6.1    Past Medical History:  Diagnosis Date  . Arthritis   . Diabetes mellitus without complication ([place])   . Hypertension   . MS (multiple sclerosis) (H[facility])     Current Outpatient Prescriptions  Medication Sig Dispense Refill  . hydrochlorothiazide (HYDRODIURIL) 25 MG tablet Take 25 mg by mouth daily.    . Interferon Beta-1b (BETASERON) 0.3 MG KIT injection Inject 0.3 mg into the skin every other day.     . ciprofloxacin (CIPRO) 500 MG tablet Take 1  tablet (500 mg total) by mouth 2 (two) times daily. 14 tablet 0   No current facility-administered medications for this visit.     Allergies: No Known Allergies  History reviewed. No pertinent surgical history.  Social History   Social History  . Marital status: Single    Spouse name: N/A  . Number of children: N/A  . Years of education: N/A   Social History Main Topics  . Smoking status: Never Smoker  . Smokeless tobacco: Never Used  . Alcohol use No  . Drug use: No  . Sexual activity: Not Asked   Other Topics Concern  . None   Social History Narrative  . None    ROS See hpi  Objective: Vitals:   10/20/16 1424  BP: 126/82  Pulse: 95  Resp: 18  Temp: 98.4 F (36.9 C)  TempSrc: Oral  SpO2: 97%   There is no height or weight on file to calculate BMI.  Physical Exam  Constitutional: She appears well-developed and well-nourished.  Morbidly obese  HENT:  Head: Normocephalic and atraumatic.  Nose: Nose normal.  Mouth/Throat: Oropharynx is clear and moist. No oropharyngeal exudate.  Eyes: Conjunctivae and EOM are normal. Left eye exhibits no discharge.  Neck: Normal range of motion. Neck supple.  Cardiovascular: Normal rate, regular rhythm and normal heart sounds.   No murmur heard. Pulmonary/Chest: Effort normal and breath sounds normal. No respiratory distress.  Abdominal: She exhibits no  distension. There is no tenderness.  Musculoskeletal:  Wheel chair bound  Neurological: She is alert.  Skin: Skin is warm. No rash noted.     Assessment and Plan Casia was seen today for cath change.  Diagnoses and all orders for this visit:  Multiple sclerosis [facility])- sent referral for continuity of care Patient will call with the name of the rest of her medications -     Ambulatory referral to Neurology -     Ambulatory referral to Urology -     Ambulatory referral to Gastroenterology  Morbid obesity [facility])- estimated based on patient appearance. Unable to  get height and weight.  Type 2 diabetes mellitus with complication, without long-term current use of insulin ([place])- since DM is well controlled and on oral meds will cancel Endocrinology referral unless patient has complication needed speciality evaluation -     Cancel: Ambulatory referral to Endocrinology  Essential hypertension- bp in good range, continue HCTZ  Primary osteoarthritis involving multiple joints  Chronic indwelling Foley catheter -     POCT urinalysis dipstick -     Ambulatory referral to Urology -     Ambulatory referral to [facility]  Chronically on opiate therapy- sent referral for continuity of care -     Ambulatory referral to Pain Clinic  Slow transit constipation- sent referral for continuity of care -     Ambulatory referral to Gastroenterology  Bacteriuria- discussed that indwelling catheterizes can be colonize so sent cipro empirically and will check culture -     Urine culture  Other orders -     ciprofloxacin (CIPRO) 500 MG tablet; Take 1 tablet (500 mg total) by mouth 2 (two) times daily.  A total of 45 minutes were spent face-to-face with the patient during this encounter and over half of that time was spent on counseling and coordination of care.    [place]

## 2016-10-21 ENCOUNTER — Telehealth: Payer: Self-pay

## 2016-10-21 NOTE — Telephone Encounter (Signed)
Melissa from [facility] care states that they cannot provide home health aid services to this patient due to an unsafe home situation. She would be happy to discuss with Dr Kaleen Mask. Her direct call back number is [phone].

## 2016-10-22 ENCOUNTER — Telehealth: Payer: Self-pay | Admitting: Family Medicine

## 2016-10-22 NOTE — Telephone Encounter (Signed)
I spoke with Rosalyns daughter and they were transferred up front for an appointment asap. She will bring her medications as well.

## 2016-10-22 NOTE — Telephone Encounter (Signed)
Spoke with Melissa from [facility] at [phone].  She reports that the patient is confined to a bed and cannot transfer to a chair and if there is an emergency she would not be able to answer the door. They cannot admit her to home health because it is not safe for her to be alone at home.   Attempted to reach the patient at 9:30am on 10/22/16 and there was no answer.  Will have staff call the patient to come in to clinic with all her medication and her supplies. If she continues to have this indwelling catheter that is not changed she may need to go to the ER to have this changed.   Please call the patient and let her know that I need to see her and her daughter in the office to discuss her referrals and the catheter.  She should bring all her home medications and her catheter.

## 2016-10-22 NOTE — Telephone Encounter (Signed)
Thank you :)

## 2016-10-23 ENCOUNTER — Ambulatory Visit (INDEPENDENT_AMBULATORY_CARE_PROVIDER_SITE_OTHER): Payer: Medicare Other | Admitting: Family Medicine

## 2016-10-23 VITALS — BP 130/80 | HR 92 | Temp 98.4°F | Resp 17

## 2016-10-23 DIAGNOSIS — Z9289 Personal history of other medical treatment: Secondary | ICD-10-CM | POA: Diagnosis not present

## 2016-10-23 DIAGNOSIS — Z466 Encounter for fitting and adjustment of urinary device: Secondary | ICD-10-CM | POA: Diagnosis not present

## 2016-10-23 DIAGNOSIS — I10 Essential (primary) hypertension: Secondary | ICD-10-CM

## 2016-10-23 DIAGNOSIS — G35 Multiple sclerosis: Secondary | ICD-10-CM

## 2016-10-23 DIAGNOSIS — R5381 Other malaise: Secondary | ICD-10-CM

## 2016-10-23 DIAGNOSIS — Z7189 Other specified counseling: Secondary | ICD-10-CM

## 2016-10-23 DIAGNOSIS — Z978 Presence of other specified devices: Secondary | ICD-10-CM

## 2016-10-23 DIAGNOSIS — Z960 Presence of urogenital implants: Principal | ICD-10-CM

## 2016-10-23 LAB — URINE CULTURE

## 2016-10-23 NOTE — Progress Notes (Signed)
Chief Complaint  Patient presents with  . follow up    catheter change     HPI This is a 55 y.o. F with MS and morbid obesity who is home bound and wheel chair bound.  Home health referral was placed and [facility] was not able to admit the patient to their service because of home safety. The patient is bed bound all day and cannot safely transfer to her motorized wheelchair. The patient lives with her daughter who has to work and is not able to be there in the day time.  Her last visit to rehab was 9 years ago She gets very little exercise and just lays in bed all day.  She is also here to get her indwelling catheter changed.  She brought her supplies to have her suprapubic bladder catheter changed. With her mobid obesity she cannot transfer to a bed but her chair reclines.  She has been on cipro for empiric treatment for a UTI since her last visit 10/20/16 Her cultures were negative  She reports that she has been taking her metformin '1000mg'$  daily and brought her medication in today.  Record request was sent to her last doctor. Her a1c was checked in late October 2017. She denies hyper or hypoglycemia.    Pt brought her Catheter supply for changing her suprapubic catheter and   Past Medical History:  Diagnosis Date  . Arthritis   . Diabetes mellitus without complication ([place])   . Hypertension   . MS (multiple sclerosis) ([place])     Current Outpatient Prescriptions  Medication Sig Dispense Refill  . atorvastatin (LIPITOR) 20 MG tablet Take 20 mg by mouth daily.    . baclofen (LIORESAL) 20 MG tablet Take 20 mg by mouth 4 (four) times daily.    . Cholecalciferol (D3-1000) 1000 units capsule Take 1,000 Units by mouth daily.    . ciprofloxacin (CIPRO) 500 MG tablet Take 1 tablet (500 mg total) by mouth 2 (two) times daily. 14 tablet 0  . cyanocobalamin 500 MCG tablet Take 500 mcg by mouth daily.    . furosemide (LASIX) 20 MG tablet Take 20 mg by mouth daily.    Marland Kitchen glipiZIDE  (GLUCOTROL) 5 MG tablet Take 5 mg by mouth daily before lunch.    . hydrochlorothiazide (HYDRODIURIL) 25 MG tablet Take 25 mg by mouth daily.    . Interferon Beta-1b (BETASERON) 0.3 MG KIT injection Inject 0.3 mg into the skin every other day.     . metFORMIN (GLUCOPHAGE) 1000 MG tablet Take 1,000 mg by mouth 2 (two) times daily with a meal.    . oxyCODONE (OXYCONTIN) 20 mg 12 hr tablet Take 20 mg by mouth 3 (three) times daily as needed.    . potassium chloride (K-DUR,KLOR-CON) 10 MEQ tablet Take 30 mEq by mouth 2 (two) times daily.    . FORTAMET 1000 MG 24 hr tablet Take 1,000 mg by mouth 2 (two) times daily.  0  . oxyCODONE-acetaminophen (PERCOCET) 10-325 MG tablet Take 1 tablet by mouth every 8 (eight) hours as needed. for pain  0   No current facility-administered medications for this visit.     Allergies: No Known Allergies  No past surgical history on file.  Social History   Social History  . Marital status: Single    Spouse name: N/A  . Number of children: N/A  . Years of education: N/A   Social History Main Topics  . Smoking status: Never Smoker  . Smokeless tobacco:  Never Used  . Alcohol use No  . Drug use: No  . Sexual activity: Not Asked   Other Topics Concern  . None   Social History Narrative  . None    ROS  Objective: Vitals:   10/23/16 1222  BP: 130/80  Pulse: 92  Resp: 17  Temp: 98.4 F (36.9 C)  TempSrc: Oral  SpO2: 98%    Physical Exam  Constitutional: She is oriented to person, place, and time. She appears well-developed and well-nourished.  Morbidly obese  HENT:  Head: Normocephalic and atraumatic.  Cardiovascular: Normal rate, regular rhythm and normal heart sounds.   No murmur heard. Pulmonary/Chest: Effort normal and breath sounds normal. No respiratory distress. She has no wheezes.  Abdominal: Soft.  Lower abdomen inspected and cleaned with betadine.  The previous foley was removed and a clean foley inserted using sterile  procedure.   Neurological: She is alert and oriented to person, place, and time.  Skin: Skin is warm. Capillary refill takes less than 2 seconds. No rash noted. No erythema.  Psychiatric: She has a normal mood and affect. Her behavior is normal. Judgment and thought content normal.    Assessment and Plan Davi was seen today for follow up.  Diagnoses and all orders for this visit:  Chronic indwelling Foley catheter Catheter (urine) change required Foley catheter changed Complicated change due to patient's body habitus (morbid obesity) and limitations on mobility (changed while reclining in a wheel chair)    Essential hypertension- bp in good range, cpm  Physical deconditioning Morbid obesity ([place]) Encounter for home safety review for injury prevention- discussed options for placement Family to call insurance to discuss benefits Will see if [facility] will accept a social work consultation to help with outpatient benefits Would benefit from subacute rehab   Multiple sclerosis [facility])- referral for neurology placed       Serayah Yazdani A Nolon Rod

## 2016-10-23 NOTE — Patient Instructions (Addendum)
  Please call your insurance and ask them how your benefits are for -  Subacute rehab for mobility issues and decondition If there is a F.L.2 FORM please make an appointment and I can complete this  Home health has social work services and I will try to order a Education officer, museum to help.   IF you received an x-ray today, you will receive an invoice from [facility]. Please contact [facility] at [phone] with questions or concerns regarding your invoice.   IF you received labwork today, you will receive an invoice from [employer]. Please contact [facility] at [phone] with questions or concerns regarding your invoice.   Our billing staff will not be able to assist you with questions regarding bills from these companies.  You will be contacted with the lab results as soon as they are available. The fastest way to get your results is to activate your My Chart account. Instructions are located on the last page of this paperwork. If you have not heard from [place] regarding the results in 2 weeks, please contact this office.

## 2016-10-25 ENCOUNTER — Ambulatory Visit: Payer: Medicare Other

## 2016-11-17 ENCOUNTER — Ambulatory Visit (INDEPENDENT_AMBULATORY_CARE_PROVIDER_SITE_OTHER): Payer: Medicare Other | Admitting: Family Medicine

## 2016-11-17 ENCOUNTER — Encounter: Payer: Self-pay | Admitting: Family Medicine

## 2016-11-17 VITALS — BP 124/88 | HR 99 | Temp 98.6°F | Resp 18

## 2016-11-17 DIAGNOSIS — Z9289 Personal history of other medical treatment: Secondary | ICD-10-CM | POA: Diagnosis not present

## 2016-11-17 DIAGNOSIS — Z466 Encounter for fitting and adjustment of urinary device: Secondary | ICD-10-CM | POA: Diagnosis not present

## 2016-11-17 DIAGNOSIS — R5381 Other malaise: Secondary | ICD-10-CM | POA: Diagnosis not present

## 2016-11-17 DIAGNOSIS — I10 Essential (primary) hypertension: Secondary | ICD-10-CM | POA: Diagnosis not present

## 2016-11-17 DIAGNOSIS — Z993 Dependence on wheelchair: Secondary | ICD-10-CM

## 2016-11-17 DIAGNOSIS — Z79891 Long term (current) use of opiate analgesic: Secondary | ICD-10-CM | POA: Insufficient documentation

## 2016-11-17 DIAGNOSIS — Z960 Presence of urogenital implants: Secondary | ICD-10-CM

## 2016-11-17 DIAGNOSIS — K5901 Slow transit constipation: Secondary | ICD-10-CM | POA: Insufficient documentation

## 2016-11-17 DIAGNOSIS — Z978 Presence of other specified devices: Secondary | ICD-10-CM | POA: Insufficient documentation

## 2016-11-17 LAB — POCT URINALYSIS DIP (MANUAL ENTRY)
BILIRUBIN UA: NEGATIVE
Bilirubin, UA: NEGATIVE
Glucose, UA: NEGATIVE
Nitrite, UA: POSITIVE — AB
PH UA: 5.5
PROTEIN UA: NEGATIVE
SPEC GRAV UA: 1.01
UROBILINOGEN UA: 0.2

## 2016-11-17 MED ORDER — DISPOSABLE GLOVES MISC: 11 refills | Status: DC

## 2016-11-17 MED ORDER — FOLEY CATHETER 2-WAY MISC: 1 refills | Status: DC

## 2016-11-17 MED ORDER — DOVER UNIVERSAL FOLEY TRAY KIT: PACK | 1 refills | Status: DC

## 2016-11-17 MED ORDER — GRX HYDROGEL GAUZE 4X4 EX PADS: MEDICATED_PAD | CUTANEOUS | 11 refills | Status: DC

## 2016-11-17 MED ORDER — DUODERM HYDROACTIVE EX PSTE: PASTE | CUTANEOUS | 11 refills | Status: DC

## 2016-11-17 MED ORDER — SYRINGE (DISPOSABLE) 30 ML MISC: 0 refills | Status: DC

## 2016-11-17 MED ORDER — NEW IMAGE SKIN/FLANGE/TAPE MISC: 1.0000 "application " | Freq: Every day | 11 refills | Status: DC | PRN

## 2016-11-17 NOTE — Progress Notes (Signed)
Chief Complaint  Patient presents with  . catheter    pt would like to have a catheter placed    HPI  In-dwelling Suprapubic Catheter Pt is here for her catheter change She reports that her urine looks clear She has her supplies and [facility] home health has started coming to the house but do not change catheters She states that she would like to check to see if there is any uti.   Essential Hypertension She reports that her blood pressures are good at home. She denies chest pains, palpitations or shortness of breath BP Readings from Last 3 Encounters:  11/18/16 106/60  11/17/16 124/88  10/23/16 130/80   She states that she will need refills soon and plans to come in for a blood pressure office visit.  She is compliant with her medications and denies side effects  Morbid obesity Pt is not weight regularly but home health was able to help weigh pt at home. She stays in bed most of the day and has noticed that her limbs are weaker She is completely dependent on her wheel chair Wt Readings from Last 3 Encounters:  11/18/16 220 lb (99.8 kg)   Multiple Sclerosis She reports that she will be getting established with Neurology Her MS seems stable She wanted to establish care  Past Medical History:  Diagnosis Date  . Arthritis   . Decubitus ulcers   . Diabetes mellitus without complication ([place])   . Hypertension   . MS (multiple sclerosis) ([place])     Current Outpatient Prescriptions  Medication Sig Dispense Refill  . atorvastatin (LIPITOR) 20 MG tablet Take 20 mg by mouth daily.    . baclofen (LIORESAL) 20 MG tablet Take 20 mg by mouth 4 (four) times daily.    . Cholecalciferol (D3-1000) 1000 units capsule Take 1,000 Units by mouth daily.    . cyanocobalamin 500 MCG tablet Take 500 mcg by mouth daily.    . FORTAMET 1000 MG 24 hr tablet Take 1,000 mg by mouth 2 (two) times daily.  0  . furosemide (LASIX) 20 MG tablet Take 20 mg by mouth daily.    Marland Kitchen glipiZIDE (GLUCOTROL) 5 MG  tablet Take 5 mg by mouth daily before lunch.    . hydrochlorothiazide (HYDRODIURIL) 25 MG tablet Take 25 mg by mouth daily.    . Interferon Beta-1b (BETASERON) 0.3 MG KIT injection Inject 0.3 mg into the skin every other day.     . metFORMIN (GLUCOPHAGE) 1000 MG tablet Take 1,000 mg by mouth 2 (two) times daily with a meal.    . oxyCODONE (OXYCONTIN) 20 mg 12 hr tablet Take 20 mg by mouth 3 (three) times daily as needed.    Marland Kitchen oxyCODONE-acetaminophen (PERCOCET) 10-325 MG tablet Take 1 tablet by mouth every 8 (eight) hours as needed. for pain  0  . potassium chloride (K-DUR,KLOR-CON) 10 MEQ tablet Take 30 mEq by mouth 2 (two) times daily.    . Catheters (DOVER UNIVERSAL FOLEY TRAY) KIT Use foley tray kit to change catheter every 3 weeks or as needed 15 each 1  . Catheters (FOLEY CATHETER 2-WAY) MISC Use to aspirate urine suprapubic 15 each 1  . Disposable Gloves MISC Use disposable gloves to change catheter as needed 200 each 11  . Hydroactive Dressings (DUODERM HYDROACTIVE) PSTE Apply to pressure wounds as needed 60 g 11  . lamoTRIgine (LAMICTAL) 100 MG tablet Take 1/2 pill po x 5 days, then 1/2 pill po bid x 5 days, then 1/2 pill  po tid x 5 days then 1 pill po bid 60 tablet 11  . Ostomy Supplies (NEW IMAGE SKIN/FLANGE/TAPE) MISC 1 application by Does not apply route daily as needed. 30 each 11  . Syringe, Disposable, (B-D SYRINGE LUER-LOK 30CC) 30 ML MISC Use to change catheter 15 each 0  . Wound Dressings (GRX HYDROGEL GAUZE 4X4) PADS Use gauze to clean wound daily as needed 200 each 11   No current facility-administered medications for this visit.     Allergies: No Known Allergies  Past Surgical History:  Procedure Laterality Date  . SUPRAPUBIC CATHETER INSERTION  2009    Social History   Social History  . Marital status: Single    Spouse name: N/A  . Number of children: N/A  . Years of education: N/A   Social History Main Topics  . Smoking status: Never Smoker  . Smokeless  tobacco: Never Used  . Alcohol use No  . Drug use: No  . Sexual activity: Not Asked   Other Topics Concern  . None   Social History Narrative  . None    Review of Systems  Constitutional: Negative for chills, fever and weight loss.  Respiratory: Negative for cough, shortness of breath and wheezing.   Cardiovascular: Negative for chest pain, palpitations and orthopnea.  Gastrointestinal: Negative for abdominal pain, nausea and vomiting.  Skin: Negative for itching and rash.    Objective: Vitals:   11/17/16 1544  BP: 124/88  Pulse: 99  Resp: 18  Temp: 98.6 F (37 C)  TempSrc: Oral  SpO2: 99%    Physical Exam  Constitutional: She is oriented to person, place, and time.  alert and oriented female, morbidly obese, wheelchair bound   HENT:  Head: Normocephalic and atraumatic.  Eyes: Conjunctivae and EOM are normal.  Neck: Normal range of motion.  Cardiovascular: Normal rate, regular rhythm and normal heart sounds.   No murmur heard. Pulmonary/Chest: Effort normal and breath sounds normal. No respiratory distress. She has no wheezes.  Abdominal: Soft. Bowel sounds are normal.  Indwell catheter Procedure Note Pt gave verbal consent. Area cleaned and prepped. Sample of urine sent for analysis.  Removed existing catheter and put new catheter.  Neurological: She is alert and oriented to person, place, and time.  Skin: Skin is warm. No erythema.    General:   Assessment and Plan Kathlyne was seen today for catheter.  Diagnoses and all orders for this visit:  Catheter (urine) change required -     Urine Microscopic  Chronic indwelling Foley catheter- pt with morbid obesity who has MS and requires indwelling catheter Changed catheter in office today -     POCT urinalysis dipstick -     Cancel: POCT Microscopic Urinalysis ([facility])  Essential hypertension- blood pressure well controlled Continue current meds Next visit will check lytes and renal function  Physical  deconditioning Morbid obesity ([place]) Wheelchair bound -   Changed catheter Discussed ways to improve strength Other orders -     Disposable Gloves MISC; Use disposable gloves to change catheter as needed -     Wound Dressings (GRX HYDROGEL GAUZE 4X4) PADS; Use gauze to clean wound daily as needed -     Ostomy Supplies (NEW IMAGE SKIN/FLANGE/TAPE) MISC; 1 application by Does not apply route daily as needed. -     Hydroactive Dressings (DUODERM HYDROACTIVE) PSTE; Apply to pressure wounds as needed -     Catheters (FOLEY CATHETER 2-WAY) MISC; Use to aspirate urine suprapubic -  Catheters (DOVER UNIVERSAL FOLEY TRAY) KIT; Use foley tray kit to change catheter every 3 weeks or as needed -     Syringe, Disposable, (B-D SYRINGE LUER-LOK 30CC) 30 ML MISC; Use to change catheter  A total of 40 minutes were spent face-to-face with the patient during this encounter and over half of that time was spent on counseling and coordination of care.    [name]

## 2016-11-17 NOTE — Patient Instructions (Signed)
     IF you received an x-ray today, you will receive an invoice from [facility]. Please contact [facility] at [phone] with questions or concerns regarding your invoice.   IF you received labwork today, you will receive an invoice from [facility]. Please contact [facility] at [phone] with questions or concerns regarding your invoice.   Our billing staff will not be able to assist you with questions regarding bills from these companies.  You will be contacted with the lab results as soon as they are available. The fastest way to get your results is to activate your My Chart account. Instructions are located on the last page of this paperwork. If you have not heard from us regarding the results in 2 weeks, please contact this office.      

## 2016-11-18 ENCOUNTER — Ambulatory Visit (INDEPENDENT_AMBULATORY_CARE_PROVIDER_SITE_OTHER): Payer: Medicare Other | Admitting: Neurology

## 2016-11-18 ENCOUNTER — Encounter: Payer: Self-pay | Admitting: Neurology

## 2016-11-18 VITALS — BP 106/60 | HR 92 | Resp 20 | Ht 67.0 in | Wt 220.0 lb

## 2016-11-18 DIAGNOSIS — Z9289 Personal history of other medical treatment: Secondary | ICD-10-CM

## 2016-11-18 DIAGNOSIS — R208 Other disturbances of skin sensation: Secondary | ICD-10-CM

## 2016-11-18 DIAGNOSIS — G35 Multiple sclerosis: Secondary | ICD-10-CM

## 2016-11-18 DIAGNOSIS — E1142 Type 2 diabetes mellitus with diabetic polyneuropathy: Secondary | ICD-10-CM | POA: Insufficient documentation

## 2016-11-18 DIAGNOSIS — Z978 Presence of other specified devices: Secondary | ICD-10-CM

## 2016-11-18 DIAGNOSIS — Z960 Presence of urogenital implants: Secondary | ICD-10-CM

## 2016-11-18 DIAGNOSIS — G8220 Paraplegia, unspecified: Secondary | ICD-10-CM | POA: Diagnosis not present

## 2016-11-18 DIAGNOSIS — E1042 Type 1 diabetes mellitus with diabetic polyneuropathy: Secondary | ICD-10-CM | POA: Diagnosis not present

## 2016-11-18 DIAGNOSIS — R29898 Other symptoms and signs involving the musculoskeletal system: Secondary | ICD-10-CM | POA: Insufficient documentation

## 2016-11-18 LAB — URINALYSIS, MICROSCOPIC ONLY: CASTS: NONE SEEN /LPF

## 2016-11-18 MED ORDER — LAMOTRIGINE 100 MG PO TABS: ORAL_TABLET | ORAL | 11 refills | Status: DC

## 2016-11-18 NOTE — Progress Notes (Signed)
[facility]  PATIENT: [name] DOB: July 31, 1961  REFERRING DOCTOR OR PCP:  Delia Chimes SOURCE: patient, records from Dr. Nolon Rod  _________________________________   HISTORICAL  CHIEF COMPLAINT:  Chief Complaint  Patient presents with  . Multiple Sclerosis    [name] is here to transfer care of MS to Dr. Felecia Shelling.  She recently relocated to [place] from [place], [place].    Dx. in 2006.  Sts. presenting  sx. were right sided weakness, tingling in both  hands.  She was living in [place], [place] at the time.  Sts. she initially seen by Dr. Myrlene Broker, and was started on Betaseron, which she remains on and tolerates well. Sts. she no longer walks at all, is in a power w/c today. Sts. she has decubitus ulcers on her buttocks, is in the process of establishing with  . Gait Disturbance    a wound care physician. Sts. she has burning, aching pain in feet.  She has a suprapubic catheter//fim    HISTORY OF PRESENT ILLNESS:  I had the pleasure of seeing your patient, [name], at [facility] for neurologic consultation regarding her multiple sclerosis.  She is a 55 year old woman who was diagnosed with MS in 2006 after she presented with tingling in her hands and right greater than left leg weakness.    She saw Dr. Luberta Robertson in [facility]. She was placed on Copaxone. Unfortunately,  she had a major exacerbation later that year and lost most of the use of her legs.  She was then placed on Betaseron and remains on Betaseron. She does not think she has had any exacerbations while she is on it. She tolerates it well and does not have any skin reactions.    Since 2007, she has been predominantly wheelchair bound strength has progressively worsened and she went from using a walker for short distances to being completely wheelchair bound.  She moved to [place] 2 months ago.   Her last MRI was greater than 10 years ago.    Gait/strength/sensation: She is  currently in a wheelchair. She is unable to use a walker. She is able to help slightly with transfers. Earlier this year, she was able to pivot and help more with transfers.   The right leg is weaker than the left.   She does not note any weakness in the arms. She has spasticity in her legs but this does not bother her too much.  She takes baclofen 20 mg 4 times a day. Spasms are most likely to occur when she is laying down but they do not prevent her from falling asleep.     She has a burning aching pain in her feet.   Of note, she also has diabetes.  Bladder/bowel: She has urinary retention and has had a suprapubic catheter since 2009. She denies any significant problems with her bowels.  Vision: She has some difficulty with the left eye and sees an ophthalmologist on a regular basis. However, this is not felt to be related to multiple sclerosis.   Fatigue/sleep: She does not note much fatigue. She sleeps well at night.  Mood/cognition: She denies any depression or anxiety she does not have any problems with memory or other cognitive skills.  REVIEW OF SYSTEMS: Constitutional: No fevers, chills, sweats, or change in appetite Eyes: Reduced left vision.  No eye pain Ear, nose and throat: No hearing loss, ear pain, nasal congestion, sore throat Cardiovascular: No chest pain, palpitations Respiratory: No shortness of breath  at rest or with exertion.   No wheezes GastrointestinaI: No nausea, vomiting, diarrhea, abdominal pain, fecal incontinence Genitourinary: She has a suprapubic catheter.. Musculoskeletal: No neck pain, back pain Integumentary: No rash, pruritus, skin lesions Neurological: as above Psychiatric: Denies depression at this time.  No anxiety Endocrine: has NIDDM Hematologic/Lymphatic: No anemia, purpura, petechiae. Allergic/Immunologic: No itchy/runny eyes, nasal congestion, recent allergic reactions, rashes  ALLERGIES: No Known Allergies  HOME MEDICATIONS:  Current  Outpatient Prescriptions:  .  atorvastatin (LIPITOR) 20 MG tablet, Take 20 mg by mouth daily., Disp: , Rfl:  .  baclofen (LIORESAL) 20 MG tablet, Take 20 mg by mouth 4 (four) times daily., Disp: , Rfl:  .  Catheters (DOVER UNIVERSAL FOLEY TRAY) KIT, Use foley tray kit to change catheter every 3 weeks or as needed, Disp: 15 each, Rfl: 1 .  Catheters (FOLEY CATHETER 2-WAY) MISC, Use to aspirate urine suprapubic, Disp: 15 each, Rfl: 1 .  Cholecalciferol (D3-1000) 1000 units capsule, Take 1,000 Units by mouth daily., Disp: , Rfl:  .  cyanocobalamin 500 MCG tablet, Take 500 mcg by mouth daily., Disp: , Rfl:  .  Disposable Gloves MISC, Use disposable gloves to change catheter as needed, Disp: 200 each, Rfl: 11 .  FORTAMET 1000 MG 24 hr tablet, Take 1,000 mg by mouth 2 (two) times daily., Disp: , Rfl: 0 .  furosemide (LASIX) 20 MG tablet, Take 20 mg by mouth daily., Disp: , Rfl:  .  glipiZIDE (GLUCOTROL) 5 MG tablet, Take 5 mg by mouth daily before lunch., Disp: , Rfl:  .  Hydroactive Dressings (DUODERM HYDROACTIVE) PSTE, Apply to pressure wounds as needed, Disp: 60 g, Rfl: 11 .  hydrochlorothiazide (HYDRODIURIL) 25 MG tablet, Take 25 mg by mouth daily., Disp: , Rfl:  .  Interferon Beta-1b (BETASERON) 0.3 MG KIT injection, Inject 0.3 mg into the skin every other day. , Disp: , Rfl:  .  metFORMIN (GLUCOPHAGE) 1000 MG tablet, Take 1,000 mg by mouth 2 (two) times daily with a meal., Disp: , Rfl:  .  Ostomy Supplies (NEW IMAGE SKIN/FLANGE/TAPE) MISC, 1 application by Does not apply route daily as needed., Disp: 30 each, Rfl: 11 .  oxyCODONE (OXYCONTIN) 20 mg 12 hr tablet, Take 20 mg by mouth 3 (three) times daily as needed., Disp: , Rfl:  .  oxyCODONE-acetaminophen (PERCOCET) 10-325 MG tablet, Take 1 tablet by mouth every 8 (eight) hours as needed. for pain, Disp: , Rfl: 0 .  potassium chloride (K-DUR,KLOR-CON) 10 MEQ tablet, Take 30 mEq by mouth 2 (two) times daily., Disp: , Rfl:  .  Syringe, Disposable,  (B-D SYRINGE LUER-LOK 30CC) 30 ML MISC, Use to change catheter, Disp: 15 each, Rfl: 0 .  Wound Dressings (GRX HYDROGEL GAUZE 4X4) PADS, Use gauze to clean wound daily as needed, Disp: 200 each, Rfl: 11 .  lamoTRIgine (LAMICTAL) 100 MG tablet, Take 1/2 pill po x 5 days, then 1/2 pill po bid x 5 days, then 1/2 pill po tid x 5 days then 1 pill po bid, Disp: 60 tablet, Rfl: 11  PAST MEDICAL HISTORY: Past Medical History:  Diagnosis Date  . Arthritis   . Decubitus ulcers   . Diabetes mellitus without complication ([place])   . Hypertension   . MS (multiple sclerosis) ([place])     PAST SURGICAL HISTORY: Past Surgical History:  Procedure Laterality Date  . SUPRAPUBIC CATHETER INSERTION  2009    FAMILY HISTORY: Family History  Problem Relation Age of Onset  . Cancer Mother   .  Hypertension Mother   . Heart disease Father   . Diabetes Father   . Hypertension Sister   . Hypertension Brother     SOCIAL HISTORY:  Social History   Social History  . Marital status: Single    Spouse name: N/A  . Number of children: N/A  . Years of education: N/A   Occupational History  . Not on file.   Social History Main Topics  . Smoking status: Never Smoker  . Smokeless tobacco: Never Used  . Alcohol use No  . Drug use: No  . Sexual activity: Not on file   Other Topics Concern  . Not on file   Social History Narrative  . No narrative on file     PHYSICAL EXAM  Vitals:   11/18/16 1315  BP: 106/60  Pulse: 92  Resp: 20  Weight: 220 lb (99.8 kg)  Height: _0  (1.702 m)    Body mass index is 34.46 kg/m.   General: The patient is well-developed and well-nourished and in no acute distress in a wheelchair  Neck: The neck is supple, no carotid bruits are noted.  The neck is nontender.  Cardiovascular: The heart has a regular rate and rhythm with a normal S1 and S2. There were no murmurs, gallops or rubs. Lungs are clear to auscultation.  Skin: Arms/legs without rash.   She has  mild pedal edema.  Musculoskeletal:  Back is nontender  Neurologic Exam  Mental status: The patient is alert and oriented x 3 at the time of the examination. The patient has apparent normal recent and remote memory, with an apparently normal attention span and concentration ability.   Speech is normal.  Cranial nerves: Extraocular movements are full. Pupils are equal, round, and reactive to light and accomodation.  Visual fields are full.  Facial symmetry is present. There is good facial sensation to soft touch bilaterally.Facial strength is normal.  Trapezius and sternocleidomastoid strength is normal. No dysarthria is noted.  The tongue is midline, and the patient has symmetric elevation of the soft palate. No obvious hearing deficits are noted.  Motor:  Muscle bulk is normal.   Tone is increase din both legs, right > left. Strength is  5 / 5 in the arms but only 0-1/5 in right leg and 1-30mnus/5 in the left leg  Sensory: Sensory testing is intact to pinprick, soft touch and vibration sensation in both arms. He has mildly reduced vibration sensation at the ankles and severe loss of vibration sensation in the toes. She has reduced sensation to touch and temperature in the toes and distal feet..  Coordination: Cerebellar testing reveals good finger-nose-finger .  RAM in hands is normal.   Gait and station: She is wheelchair bound and cannot stand   Reflexes: Deep tendon reflexes are symmetric and normal in arms, increased at knees with spread and 1-2 at the nkles.   No clonus..   Plantar responses are extensor on the right.    DIAGNOSTIC DATA (LABS, IMAGING, TESTING) - I reviewed patient records, labs, notes, testing and imaging myself where available.      ASSESSMENT AND PLAN  Multiple sclerosis ([place]  Diplegia of both lower extremities (HCC)  Diabetic polyneuropathy associated with type 1 diabetes mellitus (HCC)  Dysesthesia  Chronic indwelling Foley catheter   In summary,  Mrs. GKilmartinis a 55year old woman with MS who has diplegia with severe leg weakness and some spasticity. Additionally, she has painful dysesthesias in her feet. The weakness is most  likely due to an MS plaque in the thoracic spine as her arms are strong.   Because her painful burning dysesthesia is just in the feet, this could be due either the MS or to polyneuropathy. Diabetic polyneuropathy is probably more likely as there is not significant numbness above the ankles. Regardless, we may be able to help the pain. She did not get a benefit from gabapentin in the past so I will have her start lamotrigine and titrated up to 100 mg by mouth twice a day. This dose can be adjusted up further based on her response.     She will continue on the Betaseron for her MS and appeared to have had any recent exacerbations. She would prefer not to have an MRI of the brain and spine at this time I would consider one in the brain and spine if she has more significant changes in strength to determine if she is having subclinical progression that will make [place] want to change disease modifying therapies and also to rule out other sources of myelopathy.  She will return to see me in 6 months but call sooner if she notes any new or worsening neurologic symptoms.  Thank you for asking me to see Mrs. Zigmund Daniel for neurologic consultation. Please let me know if I can be of further assistance with her or other patients in the future.   Raynie Steinhaus A. Felecia Shelling, MD, PhD 56/94/3700, 5:25 PM Certified in Neurology, Clinical Neurophysiology, Sleep Medicine, Pain Medicine and Neuroimaging  [facility] [address] [place], [place] [address] [phone]

## 2016-11-18 NOTE — Patient Instructions (Signed)
The pharmacy has the prescription for lamotrigine 100 mg tablets. For 5 days, just take one half pill a day. For the next 5 days, take one half pill twice a day. For the next 5 days, take one half pill 3 times a day Then start taking one pill twice a day from this point on.    In the future, we may increase the dose further.  If you get a rash, need to stop the medication and not take it again. 

## 2016-11-22 ENCOUNTER — Telehealth: Payer: Self-pay | Admitting: Family Medicine

## 2016-11-22 MED ORDER — CIPROFLOXACIN HCL 500 MG PO TABS: 500.0000 mg | ORAL_TABLET | Freq: Two times a day (BID) | ORAL | 0 refills | Status: AC

## 2016-11-22 NOTE — Telephone Encounter (Signed)
Discussed UTI.  Started cipro for indwelling catheter UTI Hold glipizide while taking antibiotic.

## 2016-11-23 ENCOUNTER — Telehealth: Payer: Self-pay

## 2016-11-23 DIAGNOSIS — Z993 Dependence on wheelchair: Secondary | ICD-10-CM

## 2016-11-23 DIAGNOSIS — Z978 Presence of other specified devices: Secondary | ICD-10-CM

## 2016-11-23 DIAGNOSIS — Z960 Presence of urogenital implants: Secondary | ICD-10-CM

## 2016-11-23 DIAGNOSIS — G35 Multiple sclerosis: Secondary | ICD-10-CM

## 2016-11-23 NOTE — Telephone Encounter (Signed)
PATIENT SAW DR. Nolon Rod ON Wednesday AND DR. Nolon Rod TOLD HER TO CALL IF SHE COULD HELP HER WITH GETTING SOME MEDICAL SUPPLIES. SHE WOULD LIKE TO SPEAK WITH DR. Nolon Rod PLEASE. [facility]ST PHONE [phone] (CELL)  [place]

## 2016-11-24 DIAGNOSIS — E782 Mixed hyperlipidemia: Secondary | ICD-10-CM | POA: Insufficient documentation

## 2016-11-24 DIAGNOSIS — E1142 Type 2 diabetes mellitus with diabetic polyneuropathy: Secondary | ICD-10-CM | POA: Diagnosis not present

## 2016-11-24 DIAGNOSIS — E119 Type 2 diabetes mellitus without complications: Secondary | ICD-10-CM | POA: Insufficient documentation

## 2016-11-25 NOTE — Telephone Encounter (Signed)
[facility]eds [facility]  Wound care supplies "dr stalling knows what I need"

## 2016-11-26 ENCOUNTER — Telehealth: Payer: Self-pay

## 2016-11-26 MED ORDER — SYRINGE (DISPOSABLE) 30 ML MISC: 0 refills | Status: DC

## 2016-11-26 MED ORDER — DISPOSABLE GLOVES MISC: 11 refills | Status: DC

## 2016-11-26 MED ORDER — DUODERM HYDROACTIVE EX PSTE: PASTE | CUTANEOUS | 11 refills | Status: DC

## 2016-11-26 MED ORDER — DOVER UNIVERSAL FOLEY TRAY KIT: PACK | 1 refills | Status: DC

## 2016-11-26 MED ORDER — FOLEY CATHETER 2-WAY MISC: 1 refills | Status: DC

## 2016-11-26 MED ORDER — GRX HYDROGEL GAUZE 4X4 EX PADS: MEDICATED_PAD | CUTANEOUS | 11 refills | Status: DC

## 2016-11-26 MED ORDER — NEW IMAGE SKIN/FLANGE/TAPE MISC: 1.0000 "application " | Freq: Every day | 11 refills | Status: DC | PRN

## 2016-11-26 NOTE — Telephone Encounter (Signed)
I have placed all the medical supply orders in the chart as a fax order.  I have also placed the referral for home health.  If you could let [facility] know about the medical supplies that are ordered.  They may have a fax number that we can send it to.    Please let the patient know that I have put in the home health order and medical supplies

## 2016-11-26 NOTE — Telephone Encounter (Signed)
[name] with [facility] needs more information regarding her new home situation  [facility] number [phone]

## 2016-11-26 NOTE — Telephone Encounter (Signed)
Referral being send to [facility] this morning.

## 2016-11-26 NOTE — Telephone Encounter (Signed)
Melissa has called back to let Dr. Nolon Rod know pt can not be admitted to [facility] due to a unsafe home situation. Melissa's contact [phone]

## 2016-12-01 ENCOUNTER — Telehealth: Payer: Self-pay

## 2016-12-01 NOTE — Telephone Encounter (Signed)
Pt is callling back to inform dr Gwyneth Revels that the number she wanted earlier today was Cloyd Stagers at [phone]

## 2016-12-01 NOTE — Telephone Encounter (Signed)
[facility] called to let Nolon Rod know that they cannot order wound care and catheter care supplies for this patient unless she has [facility] health insurance as well.  Any further questions their phone number is [phone]

## 2016-12-01 NOTE — Telephone Encounter (Signed)
Spoke with patient at 8:27am regarding her current home health situation. [facility] is currently going to the house but needs medical supplies.  In order to get medical supplies they are in need of foley catheter medical supplies.   Contacted Melissa of [facility] at 12/01/16 8:30am and left voicemail stating that the patient already has home health but needed medical supplies and her social worker suggested [facility].

## 2016-12-02 NOTE — Telephone Encounter (Signed)
Left voicemail at 6:47pm on 12/02/16 notifying patient that [facility] will not supply medical supplies since she is not enrolled as a patient.  Pt should contact her insurance and discuss which medical supply company accepts her insurance. She should discuss this with her Education officer, museum.

## 2016-12-02 NOTE — Telephone Encounter (Signed)
See note

## 2016-12-07 ENCOUNTER — Telehealth: Payer: Self-pay

## 2016-12-10 ENCOUNTER — Telehealth: Payer: Self-pay

## 2016-12-10 DIAGNOSIS — Z978 Presence of other specified devices: Secondary | ICD-10-CM

## 2016-12-10 DIAGNOSIS — G35 Multiple sclerosis: Secondary | ICD-10-CM

## 2016-12-10 DIAGNOSIS — L89159 Pressure ulcer of sacral region, unspecified stage: Secondary | ICD-10-CM

## 2016-12-10 DIAGNOSIS — Z960 Presence of urogenital implants: Secondary | ICD-10-CM

## 2016-12-10 NOTE — Telephone Encounter (Signed)
Called [name] and Dr. Nolon Rod spoke with her.

## 2016-12-10 NOTE — Telephone Encounter (Signed)
Claiborne Billings gilmore is calling to talk to dr Nolon Rod about this patient      Best number [phone]

## 2016-12-10 NOTE — Telephone Encounter (Signed)
Spoke to [employer] who states that the pt is enrolled in CAP program and gets an aid at home.  She has an aid and is not at home alone.  She has decubitus ulcers and needs wound care.  She also needs medical supplies.   New order placed.

## 2016-12-14 ENCOUNTER — Telehealth: Payer: Self-pay

## 2016-12-14 NOTE — Telephone Encounter (Signed)
fyi

## 2016-12-14 NOTE — Telephone Encounter (Signed)
PATIENT STATES SHE WANTS DR. Nolon Rod TO KNOW THAT SHE HAS AN APPOINTMENT TO SEE HER ON Wednesday (12-15-16) AT 1:20 pm AND THAT SHE WILL NEED HER CATHETER SUPPLIES. BEST PHONE [phone] (CELL)  [place]

## 2016-12-15 ENCOUNTER — Encounter: Payer: Self-pay | Admitting: Family Medicine

## 2016-12-15 ENCOUNTER — Ambulatory Visit (INDEPENDENT_AMBULATORY_CARE_PROVIDER_SITE_OTHER): Payer: Medicare Other | Admitting: Family Medicine

## 2016-12-15 VITALS — BP 125/72 | HR 86 | Temp 98.5°F | Resp 16 | Ht 67.0 in

## 2016-12-15 DIAGNOSIS — I10 Essential (primary) hypertension: Secondary | ICD-10-CM | POA: Diagnosis not present

## 2016-12-15 DIAGNOSIS — Z9289 Personal history of other medical treatment: Secondary | ICD-10-CM

## 2016-12-15 DIAGNOSIS — R8271 Bacteriuria: Secondary | ICD-10-CM

## 2016-12-15 DIAGNOSIS — Z960 Presence of urogenital implants: Secondary | ICD-10-CM

## 2016-12-15 DIAGNOSIS — E1142 Type 2 diabetes mellitus with diabetic polyneuropathy: Secondary | ICD-10-CM | POA: Diagnosis not present

## 2016-12-15 DIAGNOSIS — Z978 Presence of other specified devices: Secondary | ICD-10-CM

## 2016-12-15 DIAGNOSIS — L89159 Pressure ulcer of sacral region, unspecified stage: Secondary | ICD-10-CM | POA: Diagnosis not present

## 2016-12-15 DIAGNOSIS — R5381 Other malaise: Secondary | ICD-10-CM

## 2016-12-15 DIAGNOSIS — E785 Hyperlipidemia, unspecified: Secondary | ICD-10-CM

## 2016-12-15 DIAGNOSIS — G35 Multiple sclerosis: Secondary | ICD-10-CM

## 2016-12-15 MED ORDER — NEW IMAGE SKIN/FLANGE/TAPE MISC: 1.0000 "application " | Freq: Every day | 11 refills | Status: DC | PRN

## 2016-12-15 MED ORDER — DUODERM CGF DRESSING EX MISC: 1.0000 | Freq: Every day | CUTANEOUS | 11 refills | Status: DC | PRN

## 2016-12-15 MED ORDER — DUODERM HYDROACTIVE EX PSTE: PASTE | CUTANEOUS | 11 refills | Status: DC

## 2016-12-15 MED ORDER — TENA FLEX 16 PLUS MISC: 11 refills | Status: DC

## 2016-12-15 MED ORDER — GRX HYDROGEL GAUZE 4X4 EX PADS: MEDICATED_PAD | CUTANEOUS | 11 refills | Status: DC

## 2016-12-15 MED ORDER — DISPOSABLE GLOVES MISC: 11 refills | Status: DC

## 2016-12-15 MED ORDER — SYRINGE (DISPOSABLE) 30 ML MISC: 0 refills | Status: DC

## 2016-12-15 MED ORDER — FOLEY CATHETER 2-WAY MISC: 1 refills | Status: DC

## 2016-12-15 MED ORDER — TENA FLEX 16 PLUS MISC
11 refills | Status: DC
Start: 2016-12-15 — End: 2017-01-05

## 2016-12-15 MED ORDER — DOVER UNIVERSAL FOLEY TRAY KIT: PACK | 1 refills | Status: DC

## 2016-12-15 NOTE — Telephone Encounter (Signed)
Left message letting the patient's sw know that a referral was [facility].  Unable to get her meds at [facility].

## 2016-12-15 NOTE — Progress Notes (Signed)
Chief Complaint  Patient presents with  . Follow-up    catheter change, labs    HPI   Chronic Indwelling Foley Catheter This is a complicated patient who is currently being set up with Home Health for medical supplies. Until that time she presents every 3-4 weeks for indwelling suprapubic foley catheter changes and screening for UTI.  Diabetes Mellitus: Patient presents for follow up of diabetes.   Patient also has MS and is mostly bedbound during the days.  She has an indwelling foley catheter. She denies nausea, vomiting, hypoglycemia, hyperglycemia. She has chronic tingling of her extremities.  Current meds include Metformin '1000mg'$  bid Glipizide '5mg'$  daily No results found for: HGBA1C Self-reported last a1c was 6.05 September 2016  Hyperlipidemia: Patient presents with hyperlipidemia.   Her last labs were done by her previous provider and she reports that they were in range.  chest pain, dyspnea, exertional chest pressure/discomfort, fatigue and lower extremity edema. There is a family history of hyperlipidemia. There is not a family history of early ischemia heart disease. She takes atorvastatin '20mg'$  daily.  Decubitus Ulcers She reports that she has had ulcers in the sacrum for 10 years.  She has never required surgical debridement in the past.  She has required outpatient wound management in the past.  She reports that the ulcers are currently cleaned by soap and water and that her home CNA said that the skin is still pink.  She is applying duoderm topically. She denies fevers or chills.   Hypertension: Patient here for follow-up of elevated blood pressure. She is not exercising and is adherent to low salt diet.  Blood pressure is well controlled at home. Cardiac symptoms none. Patient denies chest pain, claudication, dyspnea, exertional chest pressure/discomfort and fatigue.  Cardiovascular risk factors: diabetes mellitus, obesity (BMI >= 30 kg/m2) and sedentary lifestyle. Use of  agents associated with hypertension: none. History of target organ damage: none.  She is taking furosemide '20mg'$  daily with hctz '25mg'$  daily.    Past Medical History:  Diagnosis Date  . Arthritis   . Decubitus ulcers   . Diabetes mellitus without complication ([place])   . Hypertension   . MS (multiple sclerosis) ([place])     Current Outpatient Prescriptions  Medication Sig Dispense Refill  . atorvastatin (LIPITOR) 20 MG tablet Take 20 mg by mouth daily.    . baclofen (LIORESAL) 20 MG tablet Take 20 mg by mouth 4 (four) times daily.    . Catheters (DOVER UNIVERSAL FOLEY TRAY) KIT Use foley tray kit to change catheter every 3 weeks or as needed 15 each 1  . Catheters (FOLEY CATHETER 2-WAY) MISC Use to aspirate urine suprapubic 15 each 1  . Cholecalciferol (D3-1000) 1000 units capsule Take 1,000 Units by mouth daily.    . Control Gel Formula Dressing (DUODERM CGF DRESSING) MISC Apply 1 each topically daily as needed. 5 each 11  . cyanocobalamin 500 MCG tablet Take 500 mcg by mouth daily.    . Disposable Gloves MISC Use disposable gloves to change catheter as needed 200 each 11  . FORTAMET 1000 MG 24 hr tablet Take 1,000 mg by mouth 2 (two) times daily.  0  . furosemide (LASIX) 20 MG tablet Take 20 mg by mouth daily.    Marland Kitchen glipiZIDE (GLUCOTROL) 5 MG tablet Take 5 mg by mouth daily before lunch.    . Hydroactive Dressings (DUODERM HYDROACTIVE) PSTE Apply to pressure wounds as needed 60 g 11  . hydrochlorothiazide (HYDRODIURIL) 25 MG tablet Take  25 mg by mouth daily.    . Incontinence Supply Disposable (TENA FLEX 16 PLUS) MISC Use daily as needed 30 each 11  . Interferon Beta-1b (BETASERON) 0.3 MG KIT injection Inject 0.3 mg into the skin every other day.     . lamoTRIgine (LAMICTAL) 100 MG tablet Take 1/2 pill po x 5 days, then 1/2 pill po bid x 5 days, then 1/2 pill po tid x 5 days then 1 pill po bid 60 tablet 11  . metFORMIN (GLUCOPHAGE) 1000 MG tablet Take 1,000 mg by mouth 2 (two) times daily  with a meal.    . Ostomy Supplies (NEW IMAGE SKIN/FLANGE/TAPE) MISC 1 application by Does not apply route daily as needed. 30 each 11  . oxyCODONE (OXYCONTIN) 20 mg 12 hr tablet Take 20 mg by mouth 3 (three) times daily as needed.    Marland Kitchen oxyCODONE-acetaminophen (PERCOCET) 10-325 MG tablet Take 1 tablet by mouth every 8 (eight) hours as needed. for pain  0  . potassium chloride (K-DUR,KLOR-CON) 10 MEQ tablet Take 30 mEq by mouth 2 (two) times daily.    . Syringe, Disposable, (B-D SYRINGE LUER-LOK 30CC) 30 ML MISC Use to change catheter 15 each 0  . Wound Dressings (GRX HYDROGEL GAUZE 4X4) PADS Use gauze to clean wound daily as needed 200 each 11   No current facility-administered medications for this visit.     Allergies: No Known Allergies  Past Surgical History:  Procedure Laterality Date  . SUPRAPUBIC CATHETER INSERTION  2009    Social History   Social History  . Marital status: Single    Spouse name: N/A  . Number of children: N/A  . Years of education: N/A   Social History Main Topics  . Smoking status: Never Smoker  . Smokeless tobacco: Never Used  . Alcohol use No  . Drug use: No  . Sexual activity: Not Asked   Other Topics Concern  . None   Social History Narrative  . None    Review of Systems  Constitutional: Negative for chills and fever.  HENT: Negative for hearing loss and tinnitus.   Eyes: Negative for blurred vision and double vision.  Respiratory: Negative for cough, sputum production, shortness of breath and wheezing.   Cardiovascular: Negative for chest pain and palpitations.  Gastrointestinal: Negative for abdominal pain, nausea and vomiting.  Genitourinary: Negative for flank pain and hematuria.  Skin: Negative for itching and rash.  Neurological: Negative for dizziness and headaches.  Psychiatric/Behavioral: Negative for depression. The patient is not nervous/anxious.     Objective: Vitals:   12/15/16 1340  BP: 125/72  Pulse: 86  Resp: 16    Temp: 98.5 F (36.9 C)  TempSrc: Oral  SpO2: 97%  Height: '5\' 7"'$  (1.702 m)    Physical Exam  Constitutional: She is oriented to person, place, and time. She appears well-developed and well-nourished.  HENT:  Head: Normocephalic and atraumatic.  Right Ear: External ear normal.  Left Ear: External ear normal.  Eyes: Conjunctivae and EOM are normal.  Neck: Normal range of motion. Neck supple.  Cardiovascular: Normal rate, regular rhythm and normal heart sounds.   No murmur heard. Pulmonary/Chest: Effort normal and breath sounds normal. No respiratory distress. She has no wheezes.  Abdominal: Soft. Bowel sounds are normal.  Verbal consent given to change suprapubic foley catheter.  Suprapubic foley catheter removed after cleaning with betadine. 14 fr catheter placed. Urine flow noted immediately. Pt tolerated foley catheter change well.    Neurological: She  is oriented to person, place, and time.  Skin: Skin is warm. Capillary refill takes less than 2 seconds.  Psychiatric: She has a normal mood and affect. Her behavior is normal. Judgment and thought content normal.    Assessment and Plan [name] was seen today for follow-up.  Diagnoses and all orders for this visit:  Essential hypertension- bp well controlled on 2 meds -     Microalbumin, urine -     Lipid panel -     Comprehensive metabolic panel  Diabetic polyneuropathy associated with type 2 diabetes mellitus ([place])- fasting sugars in range at home Well controlled  -     Microalbumin, urine -     Lipid panel -     Comprehensive metabolic panel -     Hemoglobin A1c  Multiple sclerosis ([place])- continue Neuro recs -     CBC with Differential/Platelet -     Comprehensive metabolic panel  Chronic indwelling Foley catheter- catheter changed Pt tolerated well -     CBC with Differential/Platelet -     Comprehensive metabolic panel -     Urine culture  Physical deconditioning -     CBC with Differential/Platelet -      Comprehensive metabolic panel -     TSH  Dyslipidemia- will assess and refill med -     Lipid panel -     Comprehensive metabolic panel  Morbid obesity ([place])- unchanged Morbid obesity made it examination difficult as pt needs a hoist to get out of chair  Decubitus ulcer of sacral region, unspecified ulcer stage- advised pt to try to get supplies from retail pharmacy. Advised her to have her CNA take photo of her ulcers at home and provide at next visit If cannot get wound care at home will refer to wound care OP  Chronic bacteriuria- collected urine sample  -     Urine culture  Other orders -     Discontinue: Control Gel Formula Dressing (DUODERM CGF DRESSING) MISC; Apply 1 each topically daily as needed. -     Discontinue: Incontinence Supply Disposable (TENA FLEX 16 PLUS) MISC; Use daily as needed -     Discontinue: Control Gel Formula Dressing (DUODERM CGF DRESSING) MISC; Apply 1 each topically daily as needed. -     Wound Dressings (GRX HYDROGEL GAUZE 4X4) PADS; Use gauze to clean wound daily as needed -     Syringe, Disposable, (B-D SYRINGE LUER-LOK 30CC) 30 ML MISC; Use to change catheter -     Ostomy Supplies (NEW IMAGE SKIN/FLANGE/TAPE) MISC; 1 application by Does not apply route daily as needed. -     Discontinue: Incontinence Supply Disposable (TENA FLEX 16 PLUS) MISC; Use daily as needed -     Hydroactive Dressings (DUODERM HYDROACTIVE) PSTE; Apply to pressure wounds as needed -     Disposable Gloves MISC; Use disposable gloves to change catheter as needed -     Discontinue: Control Gel Formula Dressing (DUODERM CGF DRESSING) MISC; Apply 1 each topically daily as needed. -     Catheters (DOVER UNIVERSAL FOLEY TRAY) KIT; Use foley tray kit to change catheter every 3 weeks or as needed -     Catheters (FOLEY CATHETER 2-WAY) MISC; Use to aspirate urine suprapubic -     Incontinence Supply Disposable (TENA FLEX 16 PLUS) MISC; Use daily as needed -     Control Gel Formula Dressing  (DUODERM CGF DRESSING) MISC; Apply 1 each topically daily as needed.     [name]  A [name]

## 2016-12-15 NOTE — Telephone Encounter (Signed)
Pt would like Dr. Nolon Rod to know she can not get this filled at [facility]. She would like to know what she should do? Please advise at

## 2016-12-15 NOTE — Telephone Encounter (Signed)
Please let the patient know that I have contacted her social worker to see if she can help.  [facility] does not accept insurance so the only option now would be cash for the most needed items for her wounds.    Please check on her referral to [facility] before you call her please.  Thank you.

## 2016-12-16 DIAGNOSIS — E119 Type 2 diabetes mellitus without complications: Secondary | ICD-10-CM | POA: Diagnosis not present

## 2016-12-16 DIAGNOSIS — G35 Multiple sclerosis: Secondary | ICD-10-CM | POA: Diagnosis not present

## 2016-12-16 DIAGNOSIS — I10 Essential (primary) hypertension: Secondary | ICD-10-CM | POA: Diagnosis not present

## 2016-12-16 DIAGNOSIS — L89893 Pressure ulcer of other site, stage 3: Secondary | ICD-10-CM | POA: Diagnosis not present

## 2016-12-16 DIAGNOSIS — Z435 Encounter for attention to cystostomy: Secondary | ICD-10-CM | POA: Diagnosis not present

## 2016-12-16 DIAGNOSIS — M1990 Unspecified osteoarthritis, unspecified site: Secondary | ICD-10-CM | POA: Diagnosis not present

## 2016-12-16 LAB — COMPREHENSIVE METABOLIC PANEL
A/G RATIO: 1.4 (ref 1.2–2.2)
ALBUMIN: 4 g/dL (ref 3.5–5.5)
ALK PHOS: 60 IU/L (ref 39–117)
ALT: 12 IU/L (ref 0–32)
AST: 12 IU/L (ref 0–40)
BUN / CREAT RATIO: 16 (ref 9–23)
BUN: 13 mg/dL (ref 6–24)
Bilirubin Total: <0.2 mg/dL (ref 0.0–1.2)
CO2: 25 mmol/L (ref 18–29)
CREATININE: 0.83 mg/dL (ref 0.57–1.00)
Calcium: 9.1 mg/dL (ref 8.7–10.2)
Chloride: 95 mmol/L — ABNORMAL LOW (ref 96–106)
GFR calc Af Amer: 91 mL/min/{1.73_m2} (ref >59)
GFR calc non Af Amer: 79 mL/min/{1.73_m2} (ref >59)
GLOBULIN, TOTAL: 2.8 g/dL (ref 1.5–4.5)
Glucose: 122 mg/dL — ABNORMAL HIGH (ref 65–99)
Potassium: 4.2 mmol/L (ref 3.5–5.2)
SODIUM: 139 mmol/L (ref 134–144)
Total Protein: 6.8 g/dL (ref 6.0–8.5)

## 2016-12-16 LAB — LIPID PANEL
CHOLESTEROL TOTAL: 130 mg/dL (ref 100–199)
Chol/HDL Ratio: 2.4 ratio units (ref 0.0–4.4)
HDL: 54 mg/dL (ref >39)
LDL Calculated: 57 mg/dL (ref 0–99)
TRIGLYCERIDES: 95 mg/dL (ref 0–149)
VLDL CHOLESTEROL CAL: 19 mg/dL (ref 5–40)

## 2016-12-16 LAB — CBC WITH DIFFERENTIAL/PLATELET
BASOS ABS: 0 10*3/uL (ref 0.0–0.2)
Basos: 0 %
EOS (ABSOLUTE): 0.1 10*3/uL (ref 0.0–0.4)
Eos: 2 %
Hematocrit: 32.7 % — ABNORMAL LOW (ref 34.0–46.6)
Hemoglobin: 10.1 g/dL — ABNORMAL LOW (ref 11.1–15.9)
IMMATURE GRANS (ABS): 0 10*3/uL (ref 0.0–0.1)
IMMATURE GRANULOCYTES: 0 %
LYMPHS: 39 %
Lymphocytes Absolute: 2.1 10*3/uL (ref 0.7–3.1)
MCH: 25.1 pg — AB (ref 26.6–33.0)
MCHC: 30.9 g/dL — ABNORMAL LOW (ref 31.5–35.7)
MCV: 81 fL (ref 79–97)
Monocytes Absolute: 0.5 10*3/uL (ref 0.1–0.9)
Monocytes: 9 %
Neutrophils Absolute: 2.6 10*3/uL (ref 1.4–7.0)
Neutrophils: 50 %
PLATELETS: 436 10*3/uL — AB (ref 150–379)
RBC: 4.03 x10E6/uL (ref 3.77–5.28)
RDW: 15 % (ref 12.3–15.4)
WBC: 5.3 10*3/uL (ref 3.4–10.8)

## 2016-12-16 LAB — TSH: TSH: 2.64 u[IU]/mL (ref 0.450–4.500)

## 2016-12-16 LAB — MICROALBUMIN, URINE: MICROALBUM., U, RANDOM: 78.8 ug/mL

## 2016-12-16 LAB — HEMOGLOBIN A1C
Est. average glucose Bld gHb Est-mCnc: 137 mg/dL
Hgb A1c MFr Bld: 6.4 % — ABNORMAL HIGH (ref 4.8–5.6)

## 2016-12-16 NOTE — Telephone Encounter (Signed)
I called [facility] -- they have a nurse scheduled to go see the patient today for eval and wound care.

## 2016-12-16 NOTE — Telephone Encounter (Signed)
Nurse from [facility] called and said that they need an approval for a nurse to do home visits   need orders for a nurse to go to pt home 2 days a week for 1 week, 3 times a week for 8 weeks and 5 visits if needed they also need supplies for wound  hydrogel gauze, foam for dressing and a RX for a cushion for pt wheelchair  They also need approval for PT evaluation and approval for a social worker evaluation  Please call [facility] at [phone]

## 2016-12-17 ENCOUNTER — Encounter: Payer: Self-pay | Admitting: Family Medicine

## 2016-12-17 DIAGNOSIS — G35 Multiple sclerosis: Secondary | ICD-10-CM | POA: Diagnosis not present

## 2016-12-17 DIAGNOSIS — Z435 Encounter for attention to cystostomy: Secondary | ICD-10-CM | POA: Diagnosis not present

## 2016-12-17 DIAGNOSIS — L89893 Pressure ulcer of other site, stage 3: Secondary | ICD-10-CM | POA: Diagnosis not present

## 2016-12-17 DIAGNOSIS — E119 Type 2 diabetes mellitus without complications: Secondary | ICD-10-CM | POA: Diagnosis not present

## 2016-12-17 DIAGNOSIS — M1990 Unspecified osteoarthritis, unspecified site: Secondary | ICD-10-CM | POA: Diagnosis not present

## 2016-12-17 DIAGNOSIS — I10 Essential (primary) hypertension: Secondary | ICD-10-CM | POA: Diagnosis not present

## 2016-12-17 LAB — URINE CULTURE: ORGANISM ID, BACTERIA: NO GROWTH

## 2016-12-17 NOTE — Telephone Encounter (Signed)
Gave verbal order to [facility]- for wound care, Physical therapy, catheter change and dressings as well as supplies.

## 2016-12-19 DIAGNOSIS — Z435 Encounter for attention to cystostomy: Secondary | ICD-10-CM | POA: Diagnosis not present

## 2016-12-19 DIAGNOSIS — L89893 Pressure ulcer of other site, stage 3: Secondary | ICD-10-CM | POA: Diagnosis not present

## 2016-12-19 DIAGNOSIS — G35 Multiple sclerosis: Secondary | ICD-10-CM | POA: Diagnosis not present

## 2016-12-19 DIAGNOSIS — M1990 Unspecified osteoarthritis, unspecified site: Secondary | ICD-10-CM | POA: Diagnosis not present

## 2016-12-19 DIAGNOSIS — I10 Essential (primary) hypertension: Secondary | ICD-10-CM | POA: Diagnosis not present

## 2016-12-19 DIAGNOSIS — E119 Type 2 diabetes mellitus without complications: Secondary | ICD-10-CM | POA: Diagnosis not present

## 2016-12-20 DIAGNOSIS — M1990 Unspecified osteoarthritis, unspecified site: Secondary | ICD-10-CM | POA: Diagnosis not present

## 2016-12-20 DIAGNOSIS — Z435 Encounter for attention to cystostomy: Secondary | ICD-10-CM | POA: Diagnosis not present

## 2016-12-20 DIAGNOSIS — E119 Type 2 diabetes mellitus without complications: Secondary | ICD-10-CM | POA: Diagnosis not present

## 2016-12-20 DIAGNOSIS — G35 Multiple sclerosis: Secondary | ICD-10-CM | POA: Diagnosis not present

## 2016-12-20 DIAGNOSIS — I10 Essential (primary) hypertension: Secondary | ICD-10-CM | POA: Diagnosis not present

## 2016-12-20 DIAGNOSIS — L89893 Pressure ulcer of other site, stage 3: Secondary | ICD-10-CM | POA: Diagnosis not present

## 2016-12-21 DIAGNOSIS — I10 Essential (primary) hypertension: Secondary | ICD-10-CM | POA: Diagnosis not present

## 2016-12-21 DIAGNOSIS — E119 Type 2 diabetes mellitus without complications: Secondary | ICD-10-CM | POA: Diagnosis not present

## 2016-12-21 DIAGNOSIS — G35 Multiple sclerosis: Secondary | ICD-10-CM | POA: Diagnosis not present

## 2016-12-21 DIAGNOSIS — L89893 Pressure ulcer of other site, stage 3: Secondary | ICD-10-CM | POA: Diagnosis not present

## 2016-12-21 DIAGNOSIS — M1990 Unspecified osteoarthritis, unspecified site: Secondary | ICD-10-CM | POA: Diagnosis not present

## 2016-12-21 DIAGNOSIS — Z435 Encounter for attention to cystostomy: Secondary | ICD-10-CM | POA: Diagnosis not present

## 2016-12-23 ENCOUNTER — Telehealth: Payer: Self-pay | Admitting: Family Medicine

## 2016-12-23 DIAGNOSIS — Z435 Encounter for attention to cystostomy: Secondary | ICD-10-CM | POA: Diagnosis not present

## 2016-12-23 DIAGNOSIS — E119 Type 2 diabetes mellitus without complications: Secondary | ICD-10-CM | POA: Diagnosis not present

## 2016-12-23 DIAGNOSIS — I10 Essential (primary) hypertension: Secondary | ICD-10-CM | POA: Diagnosis not present

## 2016-12-23 DIAGNOSIS — M1990 Unspecified osteoarthritis, unspecified site: Secondary | ICD-10-CM | POA: Diagnosis not present

## 2016-12-23 DIAGNOSIS — G35 Multiple sclerosis: Secondary | ICD-10-CM | POA: Diagnosis not present

## 2016-12-23 DIAGNOSIS — E1140 Type 2 diabetes mellitus with diabetic neuropathy, unspecified: Secondary | ICD-10-CM

## 2016-12-23 DIAGNOSIS — L89893 Pressure ulcer of other site, stage 3: Secondary | ICD-10-CM | POA: Diagnosis not present

## 2016-12-23 NOTE — Telephone Encounter (Signed)
Spoke with Tiffany regarding suprapubic catheter.  Tiffany changed out catheter on 1/15 because patient was leaking around the 14 catheter that Stallings placed in office on 12/16/15; placed a 20/30 catheter with good return and clear.  Doing well on 1/16 and 1/17 and doing well. Today pt called concerned that suprapubic catheter in vagina; pt told office catheter not draining.  Per Tiffany, no supplies at the house; no one at home knows how to perform flushes.  Care orders state 14/10 was placed on 12/16/16 which was the first day of care.  RN presented to home today; replaced catheter today; replaced 20/30 catheter with another 20/30 catheter. Upon review of Dr. Nolon Rod notes on 10/20/16, pt reports chronic use of 20/30 catheter.  Stallings placed 14/10 catheter on 12/15/16.  Provided verbal order to support placement of 20/30 catheter last week and today.  To Dr. Nolon Rod for review and any necessary next steps/future actions.

## 2016-12-24 ENCOUNTER — Emergency Department (HOSPITAL_COMMUNITY): Payer: Medicare Other

## 2016-12-24 ENCOUNTER — Encounter (HOSPITAL_COMMUNITY): Payer: Self-pay | Admitting: Emergency Medicine

## 2016-12-24 ENCOUNTER — Emergency Department (HOSPITAL_COMMUNITY)
Admission: EM | Admit: 2016-12-24 | Discharge: 2016-12-24 | Disposition: A | Discharge status: Home / Self Care | Payer: Medicare Other | Attending: Emergency Medicine | Admitting: Emergency Medicine

## 2016-12-24 DIAGNOSIS — E119 Type 2 diabetes mellitus without complications: Secondary | ICD-10-CM | POA: Diagnosis not present

## 2016-12-24 DIAGNOSIS — D259 Leiomyoma of uterus, unspecified: Secondary | ICD-10-CM | POA: Diagnosis not present

## 2016-12-24 DIAGNOSIS — N390 Urinary tract infection, site not specified: Secondary | ICD-10-CM | POA: Diagnosis not present

## 2016-12-24 DIAGNOSIS — T83510A Infection and inflammatory reaction due to cystostomy catheter, initial encounter: Secondary | ICD-10-CM | POA: Diagnosis not present

## 2016-12-24 DIAGNOSIS — T83091A Other mechanical complication of indwelling urethral catheter, initial encounter: Secondary | ICD-10-CM | POA: Diagnosis not present

## 2016-12-24 DIAGNOSIS — R339 Retention of urine, unspecified: Secondary | ICD-10-CM | POA: Diagnosis present

## 2016-12-24 DIAGNOSIS — T83090A Other mechanical complication of cystostomy catheter, initial encounter: Secondary | ICD-10-CM | POA: Diagnosis not present

## 2016-12-24 DIAGNOSIS — I10 Essential (primary) hypertension: Secondary | ICD-10-CM | POA: Insufficient documentation

## 2016-12-24 DIAGNOSIS — Z7984 Long term (current) use of oral hypoglycemic drugs: Secondary | ICD-10-CM | POA: Insufficient documentation

## 2016-12-24 DIAGNOSIS — T83010A Breakdown (mechanical) of cystostomy catheter, initial encounter: Secondary | ICD-10-CM

## 2016-12-24 DIAGNOSIS — T849XXA Unspecified complication of internal orthopedic prosthetic device, implant and graft, initial encounter: Secondary | ICD-10-CM | POA: Diagnosis not present

## 2016-12-24 DIAGNOSIS — T83498A Other mechanical complication of other prosthetic devices, implants and grafts of genital tract, initial encounter: Secondary | ICD-10-CM | POA: Diagnosis not present

## 2016-12-24 DIAGNOSIS — Z452 Encounter for adjustment and management of vascular access device: Secondary | ICD-10-CM | POA: Diagnosis not present

## 2016-12-24 DIAGNOSIS — Y69 Unspecified misadventure during surgical and medical care: Secondary | ICD-10-CM | POA: Insufficient documentation

## 2016-12-24 DIAGNOSIS — R531 Weakness: Secondary | ICD-10-CM | POA: Diagnosis not present

## 2016-12-24 LAB — URINALYSIS, ROUTINE W REFLEX MICROSCOPIC
Bilirubin Urine: NEGATIVE
Glucose, UA: NEGATIVE mg/dL
Ketones, ur: NEGATIVE mg/dL
Nitrite: NEGATIVE
Protein, ur: 100 mg/dL — AB
Specific Gravity, Urine: 1.023 (ref 1.005–1.030)
pH: 5 (ref 5.0–8.0)

## 2016-12-24 MED ORDER — IOPAMIDOL (ISOVUE-300) INJECTION 61%
INTRAVENOUS | Status: AC
  Administered 2016-12-24: 45 mL
  Filled 2016-12-24: qty 30

## 2016-12-24 MED ORDER — CEPHALEXIN 500 MG PO CAPS: 500.0000 mg | ORAL_CAPSULE | Freq: Three times a day (TID) | ORAL | 0 refills | Status: DC

## 2016-12-24 MED ORDER — IOPAMIDOL (ISOVUE-300) INJECTION 61%
INTRAVENOUS | Status: AC
  Filled 2016-12-24: qty 30

## 2016-12-24 MED ORDER — CIPROFLOXACIN HCL 500 MG PO TABS
500.0000 mg | ORAL_TABLET | Freq: Once | ORAL | Status: AC
  Administered 2016-12-24: 500 mg via ORAL
  Filled 2016-12-24: qty 1

## 2016-12-24 MED ORDER — IOPAMIDOL (ISOVUE-300) INJECTION 61%
30.0000 mL | Freq: Once | INTRAVENOUS | Status: AC | PRN
  Administered 2016-12-24: 30 mL

## 2016-12-24 NOTE — ED Notes (Signed)
[facility] arrived for transport 

## 2016-12-24 NOTE — ED Notes (Signed)
Bed: [id number] Expected date:  Expected time:  Means of arrival:  Comments: Catheter issues

## 2016-12-24 NOTE — Telephone Encounter (Signed)
Spoke to the patient regarding foley catheter change.  The foley was too small (used what was available in the office while awaiting supplies from home health) and advanced into the urethra. It was successfully changed to 20/30 and she was give antibiotics.    Please call [facility] and leave a message for Lattie Haw to follow up on home health orders  Lattie Haw [phone]  Please let her know to verify that her catheter should be 20/30 catheter size.    Thank you.

## 2016-12-24 NOTE — ED Triage Notes (Addendum)
Pt comes in to ED w/ misplaced suprapubic catheter. Pt cath was placed tuesday by home health nurse and pt reports urine is leaking around the catheter. Pt hx of MS and type 2 diabetes. No other c/o. Denies symptoms of UTI.

## 2016-12-24 NOTE — ED Provider Notes (Addendum)
[name] Provider Note   CSN: [id number] Arrival date & time: 12/24/16  9629  By signing my name below, I, [name], attest that this documentation has been prepared under the direction and in the presence of Alexandria Porter, MD. Electronically signed, [name], ED Scribe. 12/24/16. 12:44 AM.  Time seen 12:42 AM   History   Chief Complaint Chief Complaint  Patient presents with  . Urinary Retention    Pt catheter misplaced.     HPI HPI Comments: [name] is a 56 y.o. female, with Hx of MS, Arthritis, DM, HTN, who presents to the Emergency Department for a problem with her suprapubic catheter that started on Jan 17. Pt had a suprapubic cath replaced on Jan 16 and was working fine and states that she started to notice leaking urine yesterday. Pt believes that they may have placed the catheter incorrectly. She thinks the catheter is in her vagina.  She is followed by Dr. Bridget Brooks for this. She is nonambulatory. Her daughters come and care for her every day. No abdominal pain.  The history is provided by the patient. No language interpreter was used.   PCP Alexandria Moron, MD   Past Medical History:  Diagnosis Date  . Arthritis   . Decubitus ulcers   . Diabetes mellitus without complication ([place])   . Hypertension   . MS (multiple sclerosis) [facility])     Patient Active Problem List   Diagnosis Date Noted  . Multiple sclerosis ([place]) 11/18/2016  . Leg weakness, bilateral 11/18/2016  . Dysesthesia 11/18/2016  . Diplegia of both lower extremities ([place]) 11/18/2016  . Diabetes, polyneuropathy ([place]) 11/18/2016  . Chronic indwelling Foley catheter 11/17/2016  . Essential hypertension 11/17/2016  . Slow transit constipation 11/17/2016  . Chronically on opiate therapy 11/17/2016  . Physical deconditioning 11/17/2016    Past Surgical History:  Procedure Laterality Date  . SUPRAPUBIC CATHETER INSERTION  2009    OB History    No data available      Home  Medications    Prior to Admission medications   Medication Sig Start Date End Date Taking? Authorizing Provider  atorvastatin (LIPITOR) 20 MG tablet Take 20 mg by mouth daily.   Yes Historical Provider, MD  baclofen (LIORESAL) 20 MG tablet Take 20 mg by mouth 4 (four) times daily.   Yes Historical Provider, MD  Cholecalciferol (D3-1000) 1000 units capsule Take 1,000 Units by mouth daily.   Yes Historical Provider, MD  cyanocobalamin 500 MCG tablet Take 500 mcg by mouth daily.   Yes Historical Provider, MD  FORTAMET 1000 MG 24 hr tablet Take 1,000 mg by mouth 2 (two) times daily. 10/13/16  Yes Historical Provider, MD  furosemide (LASIX) 20 MG tablet Take 20 mg by mouth daily.   Yes Historical Provider, MD  glipiZIDE (GLUCOTROL) 5 MG tablet Take 5 mg by mouth daily before lunch.   Yes Historical Provider, MD  hydrochlorothiazide (HYDRODIURIL) 25 MG tablet Take 25 mg by mouth daily.   Yes Historical Provider, MD  Interferon Beta-1b (BETASERON) 0.3 MG KIT injection Inject 0.3 mg into the skin every other day.    Yes Historical Provider, MD  metFORMIN (GLUCOPHAGE) 1000 MG tablet Take 1,000 mg by mouth 2 (two) times daily with a meal.   Yes Historical Provider, MD  oxyCODONE (OXYCONTIN) 20 mg 12 hr tablet Take 20 mg by mouth 3 (three) times daily as needed (pain).    Yes Historical Provider, MD  oxyCODONE-acetaminophen (PERCOCET) 10-325 MG tablet Take  1 tablet by mouth every 8 (eight) hours as needed for pain.  10/13/16  Yes Historical Provider, MD  potassium chloride (K-DUR,KLOR-CON) 10 MEQ tablet Take 30 mEq by mouth 2 (two) times daily.   Yes Historical Provider, MD  Catheters (DOVER UNIVERSAL FOLEY TRAY) KIT Use foley tray kit to change catheter every 3 weeks or as needed 12/15/16   Alexandria Moron, MD  Catheters (FOLEY CATHETER 2-WAY) MISC Use to aspirate urine suprapubic 12/15/16   Alexandria Moron, MD  cephALEXin (KEFLEX) 500 MG capsule Take 1 capsule (500 mg total) by mouth 3 (three) times daily.  12/24/16   Alexandria Porter, MD  Control Gel Formula Dressing (DUODERM CGF DRESSING) MISC Apply 1 each topically daily as needed. 12/15/16   Alexandria Moron, MD  Disposable Gloves MISC Use disposable gloves to change catheter as needed 12/15/16   Alexandria Moron, MD  Hydroactive Dressings (DUODERM HYDROACTIVE) PSTE Apply to pressure wounds as needed 12/15/16 12/15/17  Alexandria Moron, MD  Incontinence Supply Disposable (TENA FLEX 16 PLUS) MISC Use daily as needed 12/15/16   Alexandria Moron, MD  lamoTRIgine (LAMICTAL) 100 MG tablet Take 1/2 pill po x 5 days, then 1/2 pill po bid x 5 days, then 1/2 pill po tid x 5 days then 1 pill po bid Patient not taking: Reported on 12/24/2016 11/18/16   Alexandria Bottom, MD  Ostomy Supplies (NEW IMAGE SKIN/FLANGE/TAPE) MISC 1 application by Does not apply route daily as needed. 12/15/16   Alexandria Moron, MD  Syringe, Disposable, (B-D SYRINGE LUER-LOK 30CC) 30 ML MISC Use to change catheter 12/15/16   Alexandria Moron, MD  Wound Dressings (GRX HYDROGEL GAUZE 4X4) PADS Use gauze to clean wound daily as needed 12/15/16   Alexandria Moron, MD    Family History Family History  Problem Relation Age of Onset  . Cancer Mother   . Hypertension Mother   . Heart disease Father   . Diabetes Father   . Hypertension Sister   . Hypertension Brother     Social History Social History  Substance Use Topics  . Smoking status: Never Smoker  . Smokeless tobacco: Never Used  . Alcohol use No  lives at home Lives alone   Allergies   Patient has no known allergies.   Review of Systems Review of Systems  Gastrointestinal: Negative for abdominal pain.  Genitourinary: Negative for decreased urine volume and dysuria.  All other systems reviewed and are negative.    Physical Exam Updated Vital Signs BP 124/67 (BP Location: Right Arm)   Pulse 79   Resp 20   Ht '5\' 7"'$  (1.702 m)   Wt 220 lb (99.8 kg)   SpO2 95%   BMI 34.46 kg/m   Vital signs normal    Physical Exam    Constitutional: She is oriented to person, place, and time. She appears well-developed and well-nourished.  Non-toxic appearance. She does not appear ill. No distress.  HENT:  Head: Normocephalic and atraumatic.  Right Ear: External ear normal.  Left Ear: External ear normal.  Nose: Nose normal.  Mouth/Throat: Mucous membranes are normal.  Eyes: Conjunctivae and EOM are normal. Pupils are equal, round, and reactive to light.  Neck: Normal range of motion and full passive range of motion without pain. Neck supple.  Cardiovascular: Normal heart sounds.  Exam reveals no gallop and no friction rub.   No murmur heard. Pulmonary/Chest: Effort normal. No respiratory distress. She has no rhonchi. She exhibits no  crepitus.  Abdominal: Soft. Normal appearance. She exhibits no distension.  Genitourinary:  Genitourinary Comments: Suprapubic catheter in place.   Musculoskeletal: She exhibits no edema or tenderness.  Neurological: She is alert and oriented to person, place, and time. She has normal strength. No cranial nerve deficit.  Skin: Skin is warm, dry and intact. No rash noted. No erythema. No pallor.  Psychiatric: She has a normal mood and affect. Her speech is normal and behavior is normal. Her mood appears not anxious.  Nursing note and vitals reviewed.    ED Treatments / Results  Labs (all labs ordered are listed, but only abnormal results are displayed)  Results for orders placed or performed during the hospital encounter of 12/24/16  Urinalysis, Routine w reflex microscopic  Result Value Ref Range   Color, Urine YELLOW YELLOW   APPearance TURBID (A) CLEAR   Specific Gravity, Urine 1.023 1.005 - 1.030   pH 5.0 5.0 - 8.0   Glucose, UA NEGATIVE NEGATIVE mg/dL   Hgb urine dipstick MODERATE (A) NEGATIVE   Bilirubin Urine NEGATIVE NEGATIVE   Ketones, ur NEGATIVE NEGATIVE mg/dL   Protein, ur 320 (A) NEGATIVE mg/dL   Nitrite NEGATIVE NEGATIVE   Leukocytes, UA LARGE (A) NEGATIVE    RBC / HPF TOO NUMEROUS TO COUNT 0 - 5 RBC/hpf   WBC, UA TOO NUMEROUS TO COUNT 0 - 5 WBC/hpf   Bacteria, UA MANY (A) NONE SEEN   Squamous Epithelial / LPF TOO NUMEROUS TO COUNT (A) NONE SEEN   WBC Clumps PRESENT    Mucous PRESENT    Laboratory interpretation all normal except prob UTI    UA and U Culture obtained, urine grossly cloudy    EKG  EKG Interpretation None       Radiology Dg Pelvis 1-2 Views  Result Date: 12/24/2016 CLINICAL DATA:  Suprapubic catheter dysfunction. EXAM: PELVIS - 1-2 VIEW COMPARISON:  CT pelvis performed earlier this day post suprapubic catheter contrast installation. FINDINGS: Three AP views of the pelvis obtained. Technologist notes state the referring clinician repositioned of the catheter between these. Calcified fibroids are seen. Initial radiograph demonstrates suprapubic catheter tubing with contrast in the vagina and perineum. Radiograph labeled supine #2 demonstrates similar contrast in the vagina and perineum. Radiograph labeled supine #3 also demonstrates similar contrast in the vagina and perineum. Contrast does not opacify the bladder on any view. IMPRESSION: Contrast instilled through suprapubic tube opacifies the tubing, vagina and perineum. Tip of the catheter may be within the urethra with vaginal reflux versus fistula. Electronically Signed   By: Rubye Oaks M.D.   On: 12/24/2016 04:22   Ct Pelvis Wo Contrast  Result Date: 12/24/2016 CLINICAL DATA:  Urinary retention EXAM: CT PELVIS WITHOUT CONTRAST TECHNIQUE: Multidetector CT imaging of the pelvis was performed following the standard protocol without intravenous contrast. Technologist reports that 30 mL of Isovue-300 was injected into the suprapubic catheter to evaluate position COMPARISON:  None. FINDINGS: Urinary Tract: Suprapubic catheter is visualized. The urinary bladder is empty. The balloon is in the bladder trigone/upper urethra and the tip of the catheter is in the urethra. The  contrast set was injected through the catheter is extravasated outside the patient between the labial folds. Bowel:  No dilated bowel.  Appendix nor Vascular/Lymphatic: No aneurysmal dilatation. No enlarged pelvic nodes Reproductive: Enlarged uterus with multiple calcified masses/ fibroids. Other:  No free fluid Musculoskeletal: Degenerative changes. No acute or suspicious bone lesions IMPRESSION: 1. Suprapubic catheter balloon is in the bladder trigone/upper urethra. The  tip of the catheter is within the urethra, near the external orifice. The contrast that was injected is primarily external to the patient in the labial folds. 2. Enlarged fibroid uterus Electronically Signed   By: Donavan Foil M.D.   On: 12/24/2016 02:37    Procedures Procedures (including critical care time)  Medications Ordered in ED Medications  iopamidol (ISOVUE-300) 61 % injection (not administered)  iopamidol (ISOVUE-300) 61 % injection (not administered)  ciprofloxacin (CIPRO) tablet 500 mg (not administered)  iopamidol (ISOVUE-300) 61 % injection 30 mL (30 mLs Other Contrast Given 12/24/16 0206)  iopamidol (ISOVUE-300) 61 % injection (45 mLs  Contrast Given 12/24/16 0357)     Initial Impression / Assessment and Plan / ED Course  I have reviewed the triage vital signs and the nursing notes.  Pertinent labs & imaging results that were available during my care of the patient were reviewed by me and considered in my medical decision making (see chart for details).  DIAGNOSTIC STUDIES: Oxygen Saturation is 97% on RA, normal by my interpretation.  COORDINATION OF CARE: 12:47 AM-Discussed treatment plan with pt at bedside and pt agreed to plan. Will replace catheter.  01:30 AM I went to reposition her suprapubic catheter. I deflated the balloon and advanced it and she said it was in her vagina and when I looked at her groin I could see the catheter tip in her groin. At this point CT was done to try to decide if patient  has a fistula, if the catheter is tracking outside the bladder.   PT has a # 20 foley with a 30 cc balloon. We only have a 5 cc balloon. Pt states she has always had the larger balloon.    3:12 AM-I went to reposition her reposition suprapubic catheter again. The balloon was let down and actually was visible at the opening, it was advanced slightly and balloon filled again. Xray ordered with contrast to verify position.   3:51 AM- Readjusted her catheter in Radiology after contrast spilled into the vaginal opening again.   06:00 AM waiting for last xray to be read after last readjustment. Pt states her groin is now staying dry, has small amount of urine in tube.   07:05 AM Dr Junious Silk, Urology, states patient probably has a very small bladder which makes keeping the catheter in the bladder difficult. Recommends only using 10cc in the balloon and she can be seen in the office if she has further problems.   I removed 15 cc from the balloon with immediate release of cloudy urine. Pt's groin remains dry.   Pt had a urine culture done on Jan 12 that showed no growth. PT was given cipro in the ED however she was discharged on keflex due to possible hypoglycemia with her diabetes medication.   Final Clinical Impressions(s) / ED Diagnoses   Final diagnoses:  Suprapubic catheter dysfunction, initial encounter ([place])  Urinary tract infection associated with cystostomy catheter, initial encounter ([place])     New Prescriptions New Prescriptions   CEPHALEXIN (KEFLEX) 500 MG CAPSULE    Take 1 capsule (500 mg total) by mouth 3 (three) times daily.   Plan discharge  Alexandria Porter, MD, FACEP   I personally performed the services described in this documentation, which was scribed in my presence. The recorded information has been reviewed and considered.  Alexandria Porter, MD, Barbette Or, MD 12/24/16 [place], MD 12/24/16 [phone]

## 2016-12-24 NOTE — ED Notes (Signed)
[facility] contacted for transport back to home.

## 2016-12-24 NOTE — Discharge Instructions (Signed)
Take the antibiotics until gone. You will get called if your urine culture shows you have an infection that requires a different antibiotic. If you have further catheter problems, call [facility].  Return to the ED if you get a fever, vomiting, abdominal pain.

## 2016-12-25 DIAGNOSIS — E119 Type 2 diabetes mellitus without complications: Secondary | ICD-10-CM | POA: Diagnosis not present

## 2016-12-25 DIAGNOSIS — Z435 Encounter for attention to cystostomy: Secondary | ICD-10-CM | POA: Diagnosis not present

## 2016-12-25 DIAGNOSIS — M1990 Unspecified osteoarthritis, unspecified site: Secondary | ICD-10-CM | POA: Diagnosis not present

## 2016-12-25 DIAGNOSIS — L89893 Pressure ulcer of other site, stage 3: Secondary | ICD-10-CM | POA: Diagnosis not present

## 2016-12-25 DIAGNOSIS — G35 Multiple sclerosis: Secondary | ICD-10-CM | POA: Diagnosis not present

## 2016-12-25 DIAGNOSIS — I10 Essential (primary) hypertension: Secondary | ICD-10-CM | POA: Diagnosis not present

## 2016-12-25 LAB — URINE CULTURE

## 2016-12-27 ENCOUNTER — Telehealth: Payer: Self-pay

## 2016-12-27 NOTE — Telephone Encounter (Signed)
Patient is calling to request a refill of her percocet and oxycontin.  Patient normally sees [facility].  Please advise  [phone]

## 2016-12-27 NOTE — Telephone Encounter (Signed)
Lisa advised

## 2016-12-29 ENCOUNTER — Telehealth: Payer: Self-pay

## 2016-12-29 DIAGNOSIS — L89893 Pressure ulcer of other site, stage 3: Secondary | ICD-10-CM | POA: Diagnosis not present

## 2016-12-29 DIAGNOSIS — I10 Essential (primary) hypertension: Secondary | ICD-10-CM | POA: Diagnosis not present

## 2016-12-29 DIAGNOSIS — E119 Type 2 diabetes mellitus without complications: Secondary | ICD-10-CM | POA: Diagnosis not present

## 2016-12-29 DIAGNOSIS — M1990 Unspecified osteoarthritis, unspecified site: Secondary | ICD-10-CM | POA: Diagnosis not present

## 2016-12-29 DIAGNOSIS — Z435 Encounter for attention to cystostomy: Secondary | ICD-10-CM | POA: Diagnosis not present

## 2016-12-29 DIAGNOSIS — G35 Multiple sclerosis: Secondary | ICD-10-CM | POA: Diagnosis not present

## 2016-12-29 MED ORDER — OXYCODONE-ACETAMINOPHEN 10-325 MG PO TABS: 1.0000 | ORAL_TABLET | Freq: Three times a day (TID) | ORAL | 0 refills | Status: DC | PRN

## 2016-12-29 MED ORDER — OXYCODONE HCL ER 20 MG PO T12A: 20.0000 mg | EXTENDED_RELEASE_TABLET | Freq: Three times a day (TID) | ORAL | 0 refills | Status: DC | PRN

## 2016-12-29 MED ORDER — CIPROFLOXACIN HCL 500 MG PO TABS: 500.0000 mg | ORAL_TABLET | Freq: Two times a day (BID) | ORAL | 0 refills | Status: AC

## 2016-12-29 NOTE — Telephone Encounter (Signed)
Cherise from [facility] is calling to get verbal orders for PT for this patient. She can be reached at [phone] or [phone].

## 2016-12-29 NOTE — Telephone Encounter (Signed)
Sent in Ciprofloxacin 500mg  twice a day for 3 days. Let the patient know that she should stop the Glipizide while taking the antibiotic because the antibiotic and Glipizide can lead to dangerously low sugars.

## 2016-12-29 NOTE — Telephone Encounter (Signed)
[facility] reviewed and there is no aberrant patterns.  Prescriptions printed at 104  Please check on her Pain Medicine Referral to see what the status is?

## 2016-12-29 NOTE — Telephone Encounter (Signed)
Left voicemail for Cherise and left verbal orders.

## 2016-12-29 NOTE — Telephone Encounter (Signed)
Kellie the home care nurse given new prescription instructions and advised- not to take Glipizide and antibiotic for 3 days.  Verbalized understanding

## 2016-12-29 NOTE — Telephone Encounter (Signed)
Please advise 

## 2016-12-29 NOTE — Telephone Encounter (Signed)
[place] DR. STALLINGS. PATIENT WAS SEEN AT [facility] FOR PROBLEMS WITH HER CATHETER. THEY PRESCRIBED HER TO HAVE CEPHALEXIN 500 mg WHICH IS GIVING HER SIDE EFFECTS OF DIARRHEA AND UPSET STOMACH. SHE WOULD LIKE TO KNOW IF DR. STALLINGS WILL CHANGE IT TO CIPRO? BEST PHONE [phone] ([place]) PHARMACY CHOICE IS [place].  [place]

## 2016-12-30 DIAGNOSIS — L89893 Pressure ulcer of other site, stage 3: Secondary | ICD-10-CM | POA: Diagnosis not present

## 2016-12-30 DIAGNOSIS — G35 Multiple sclerosis: Secondary | ICD-10-CM | POA: Diagnosis not present

## 2016-12-30 DIAGNOSIS — M1990 Unspecified osteoarthritis, unspecified site: Secondary | ICD-10-CM | POA: Diagnosis not present

## 2016-12-30 DIAGNOSIS — Z435 Encounter for attention to cystostomy: Secondary | ICD-10-CM | POA: Diagnosis not present

## 2016-12-30 DIAGNOSIS — E119 Type 2 diabetes mellitus without complications: Secondary | ICD-10-CM | POA: Diagnosis not present

## 2016-12-30 DIAGNOSIS — I10 Essential (primary) hypertension: Secondary | ICD-10-CM | POA: Diagnosis not present

## 2016-12-31 DIAGNOSIS — I10 Essential (primary) hypertension: Secondary | ICD-10-CM | POA: Diagnosis not present

## 2016-12-31 DIAGNOSIS — L89893 Pressure ulcer of other site, stage 3: Secondary | ICD-10-CM | POA: Diagnosis not present

## 2016-12-31 DIAGNOSIS — E119 Type 2 diabetes mellitus without complications: Secondary | ICD-10-CM | POA: Diagnosis not present

## 2016-12-31 DIAGNOSIS — Z435 Encounter for attention to cystostomy: Secondary | ICD-10-CM | POA: Diagnosis not present

## 2016-12-31 DIAGNOSIS — G35 Multiple sclerosis: Secondary | ICD-10-CM | POA: Diagnosis not present

## 2016-12-31 DIAGNOSIS — M1990 Unspecified osteoarthritis, unspecified site: Secondary | ICD-10-CM | POA: Diagnosis not present

## 2017-01-04 ENCOUNTER — Telehealth: Payer: Self-pay | Admitting: Internal Medicine

## 2017-01-04 ENCOUNTER — Telehealth: Payer: Self-pay

## 2017-01-04 DIAGNOSIS — Z435 Encounter for attention to cystostomy: Secondary | ICD-10-CM | POA: Diagnosis not present

## 2017-01-04 DIAGNOSIS — L89159 Pressure ulcer of sacral region, unspecified stage: Secondary | ICD-10-CM

## 2017-01-04 DIAGNOSIS — I10 Essential (primary) hypertension: Secondary | ICD-10-CM | POA: Diagnosis not present

## 2017-01-04 DIAGNOSIS — E119 Type 2 diabetes mellitus without complications: Secondary | ICD-10-CM | POA: Diagnosis not present

## 2017-01-04 DIAGNOSIS — G35 Multiple sclerosis: Secondary | ICD-10-CM | POA: Diagnosis not present

## 2017-01-04 DIAGNOSIS — L89893 Pressure ulcer of other site, stage 3: Secondary | ICD-10-CM | POA: Diagnosis not present

## 2017-01-04 DIAGNOSIS — M1990 Unspecified osteoarthritis, unspecified site: Secondary | ICD-10-CM | POA: Diagnosis not present

## 2017-01-04 NOTE — Telephone Encounter (Signed)
Records will be placed on 10/21/16 AM DOD Dr.Perry's desk for review.

## 2017-01-04 NOTE — Telephone Encounter (Signed)
[facility] nurse is calling to ask if Nolon Rod could speak with patient during her appointment tomorrow about getting a boot for her ankle since she has restricted ankle flexion.  She states that this would make transferring the patient easier.  Please advise  [phone]

## 2017-01-05 ENCOUNTER — Ambulatory Visit (INDEPENDENT_AMBULATORY_CARE_PROVIDER_SITE_OTHER): Payer: Medicare Other | Admitting: Family Medicine

## 2017-01-05 ENCOUNTER — Encounter: Payer: Self-pay | Admitting: Family Medicine

## 2017-01-05 VITALS — BP 90/60 | HR 86 | Temp 98.3°F | Resp 16 | Ht 67.0 in

## 2017-01-05 DIAGNOSIS — L89159 Pressure ulcer of sacral region, unspecified stage: Secondary | ICD-10-CM

## 2017-01-05 DIAGNOSIS — I10 Essential (primary) hypertension: Secondary | ICD-10-CM | POA: Diagnosis not present

## 2017-01-05 DIAGNOSIS — Z960 Presence of urogenital implants: Secondary | ICD-10-CM | POA: Diagnosis not present

## 2017-01-05 DIAGNOSIS — R5381 Other malaise: Secondary | ICD-10-CM

## 2017-01-05 DIAGNOSIS — Z9289 Personal history of other medical treatment: Secondary | ICD-10-CM | POA: Diagnosis not present

## 2017-01-05 DIAGNOSIS — Z978 Presence of other specified devices: Secondary | ICD-10-CM

## 2017-01-05 MED ORDER — DUODERM CGF DRESSING EX MISC: 1.0000 | Freq: Every day | CUTANEOUS | 11 refills | Status: DC | PRN

## 2017-01-05 MED ORDER — NEW IMAGE SKIN/FLANGE/TAPE MISC: 1.0000 "application " | Freq: Every day | 11 refills | Status: DC | PRN

## 2017-01-05 MED ORDER — DISPOSABLE GLOVES MISC: 11 refills | Status: DC

## 2017-01-05 MED ORDER — GRX HYDROGEL GAUZE 4X4 EX PADS: MEDICATED_PAD | CUTANEOUS | 11 refills | Status: DC

## 2017-01-05 MED ORDER — TENA PROTECT UNDERWEAR PLS/XL MISC: 11 refills | Status: DC

## 2017-01-05 MED ORDER — DUODERM HYDROACTIVE EX PSTE: PASTE | CUTANEOUS | 11 refills | Status: AC

## 2017-01-05 MED ORDER — TENA FLEX 16 PLUS MISC: 11 refills | Status: DC

## 2017-01-05 MED ORDER — "PAPER TAPE 1""X10YD TAPE": MEDICATED_TAPE | 6 refills | Status: DC

## 2017-01-05 NOTE — Patient Instructions (Signed)
     IF you received an x-ray today, you will receive an invoice from [facility]. Please contact [facility] at [phone] with questions or concerns regarding your invoice.   IF you received labwork today, you will receive an invoice from [facility]. Please contact [facility] at [phone] with questions or concerns regarding your invoice.   Our billing staff will not be able to assist you with questions regarding bills from these companies.  You will be contacted with the lab results as soon as they are available. The fastest way to get your results is to activate your My Chart account. Instructions are located on the last page of this paperwork. If you have not heard from us regarding the results in 2 weeks, please contact this office.     

## 2017-01-05 NOTE — Progress Notes (Signed)
Chief Complaint  Patient presents with  . Follow-up    catheter change    HPI   Pt with poor ankle flexion per PT notes She has appt with Podiatry in one week The lack of ankle flexion makes transfering from chair to bed and back very difficult  Indwelling foley catheter Pt reports that she no longer feels confident about home health changing her cateher. She states that her urine has been cloudy so she wanted to check her urine today.  She denies fevers or chills  Hypertension Pt has not been check her bp daily She reports that her readings with home health have been low 100s She takes hctz daily She recevied a letter with results but did not understand what was meant by take hctz every other day She also has LE edema so the hctz helps that BP Readings from Last 3 Encounters:  01/05/17 90/60  12/24/16 130/78  12/15/16 125/72     Past Medical History:  Diagnosis Date  . Arthritis   . Decubitus ulcers   . Diabetes mellitus without complication ([place])   . Hypertension   . MS (multiple sclerosis) ([place])     Current Outpatient Prescriptions  Medication Sig Dispense Refill  . Adhesive Tape (PAPER TAPE 1"X10YD) TAPE Apply to dressings as needed 4 each 6  . atorvastatin (LIPITOR) 20 MG tablet Take 20 mg by mouth daily.    . baclofen (LIORESAL) 20 MG tablet Take 20 mg by mouth 4 (four) times daily.    . Catheters (DOVER UNIVERSAL FOLEY TRAY) KIT Use foley tray kit to change catheter every 3 weeks or as needed 15 each 1  . Catheters (FOLEY CATHETER 2-WAY) MISC Use to aspirate urine suprapubic 15 each 1  . cephALEXin (KEFLEX) 500 MG capsule Take 1 capsule (500 mg total) by mouth 3 (three) times daily. 30 capsule 0  . Cholecalciferol (D3-1000) 1000 units capsule Take 1,000 Units by mouth daily.    . Control Gel Formula Dressing (DUODERM CGF DRESSING) MISC Apply 1 each topically daily as needed. 5 each 11  . cyanocobalamin 500 MCG tablet Take 500 mcg by mouth daily.    .  Disposable Gloves MISC Use disposable gloves to change catheter as needed 200 each 11  . FORTAMET 1000 MG 24 hr tablet Take 1,000 mg by mouth 2 (two) times daily.  0  . furosemide (LASIX) 20 MG tablet Take 20 mg by mouth daily.    [place] Kitchen glipiZIDE (GLUCOTROL) 5 MG tablet Take 5 mg by mouth daily before lunch.    . Hydroactive Dressings (DUODERM HYDROACTIVE) PSTE Apply to pressure wounds as needed 60 g 11  . hydrochlorothiazide (HYDRODIURIL) 25 MG tablet Take 25 mg by mouth daily.    . Incontinence Supply Disposable (TENA FLEX 16 PLUS) MISC Use daily as needed 30 each 11  . Incontinence Supply Disposable (TENA PROTECT UNDERWEAR PLS/XLG) MISC Use for incontinence. 30 each 11  . Interferon Beta-1b (BETASERON) 0.3 MG KIT injection Inject 0.3 mg into the skin every other day.     . lamoTRIgine (LAMICTAL) 100 MG tablet Take 1/2 pill po x 5 days, then 1/2 pill po bid x 5 days, then 1/2 pill po tid x 5 days then 1 pill po bid (Patient not taking: Reported on 12/24/2016) 60 tablet 11  . metFORMIN (GLUCOPHAGE) 1000 MG tablet Take 1,000 mg by mouth 2 (two) times daily with a meal.    . Ostomy Supplies (NEW IMAGE SKIN/FLANGE/TAPE) MISC 1 application by Does not  apply route daily as needed. 30 each 11  . oxyCODONE (OXYCONTIN) 20 mg 12 hr tablet Take 1 tablet (20 mg total) by mouth 3 (three) times daily as needed (pain). 90 tablet 0  . oxyCODONE-acetaminophen (PERCOCET) 10-325 MG tablet Take 1 tablet by mouth every 8 (eight) hours as needed for pain. 90 tablet 0  . potassium chloride (K-DUR,KLOR-CON) 10 MEQ tablet Take 30 mEq by mouth 2 (two) times daily.    . Syringe, Disposable, (B-D SYRINGE LUER-LOK 30CC) 30 ML MISC Use to change catheter 15 each 0  . Wound Dressings (GRX HYDROGEL GAUZE 4X4) PADS Use gauze to clean wound daily as needed 200 each 11   No current facility-administered medications for this visit.     Allergies: No Known Allergies  Past Surgical History:  Procedure Laterality Date  . SUPRAPUBIC  CATHETER INSERTION  2009    Social History   Social History  . Marital status: Single    Spouse name: N/A  . Number of children: N/A  . Years of education: N/A   Social History Main Topics  . Smoking status: Never Smoker  . Smokeless tobacco: Never Used  . Alcohol use No  . Drug use: No  . Sexual activity: Not Asked   Other Topics Concern  . None   Social History Narrative  . None    Review of Systems  Constitutional: Negative for chills and fever.  Cardiovascular: Negative for chest pain and palpitations.  Gastrointestinal: Negative for abdominal pain, nausea and vomiting.  Genitourinary: Negative for flank pain and hematuria.  Neurological: Negative for dizziness and headaches.    Objective: Vitals:   01/05/17 1337  BP: 90/60  Pulse: 86  Resp: 16  Temp: 98.3 F (36.8 C)  TempSrc: Oral  SpO2: 99%  Height: _0  (1.702 m)    Physical Exam  Constitutional: She is oriented to person, place, and time. She appears well-developed and well-nourished.  Morbidly Obese  pt wheelchair bound  HENT:  Head: Normocephalic and atraumatic.  Eyes: Conjunctivae and EOM are normal.  Cardiovascular: Normal rate, regular rhythm and normal heart sounds.   No murmur heard. Pulmonary/Chest: Effort normal and breath sounds normal. No respiratory distress. She has no wheezes.  Abdominal: Soft. Bowel sounds are normal. There is no tenderness. There is no guarding.  Suprapubic catheter removed and replaced with a 52F catheter with good flow. Only 15cc of saline was used to inflate the bulb.  Neurological: She is alert and oriented to person, place, and time.  Psychiatric: She has a normal mood and affect. Her behavior is normal. Judgment and thought content normal.    Assessment and Plan [name] was seen today for follow-up.  Diagnoses and all orders for this visit:  Hypertension- advised pt to take HCTZ every other day   Presence of indwelling Foley catheter- urine cloudy,  sent for microscopic analysis -     Urine Microscopic  Physical deconditioning- discuss ankle boot with Podiatry  Decubitus ulcer of sacral region, unspecified ulcer stage- ordered dressing changes, pt getting wound care at home. Her social worker [name] will order supplies from the county for her  Other orders -     Control Gel Formula Dressing (DUODERM CGF DRESSING) [place]; Apply 1 each topically daily as needed. -     Hydroactive Dressings (DUODERM HYDROACTIVE) PSTE; Apply to pressure wounds as needed -     Incontinence Supply Disposable (TENA FLEX 16 PLUS) MISC; Use daily as needed -  Wound Dressings (GRX HYDROGEL GAUZE 4X4) PADS; Use gauze to clean wound daily as needed -     Ostomy Supplies (NEW IMAGE SKIN/FLANGE/TAPE) MISC; 1 application by Does not apply route daily as needed. -     Disposable Gloves MISC; Use disposable gloves to change catheter as needed -     Incontinence Supply Disposable (TENA PROTECT UNDERWEAR PLS/XLG) MISC; Use for incontinence. -     Adhesive Tape (PAPER TAPE 1"X10YD) TAPE; Apply to dressings as needed  A total of 30 minutes were spent face-to-face with the patient during this encounter and over half of that time was spent on counseling and coordination of care.    [place]

## 2017-01-06 ENCOUNTER — Other Ambulatory Visit: Payer: Self-pay | Admitting: Family Medicine

## 2017-01-06 LAB — URINALYSIS, MICROSCOPIC ONLY: Casts: NONE SEEN /lpf

## 2017-01-06 MED ORDER — FLUCONAZOLE 150 MG PO TABS: 150.0000 mg | ORAL_TABLET | Freq: Once | ORAL | 0 refills | Status: AC

## 2017-01-07 DIAGNOSIS — M1990 Unspecified osteoarthritis, unspecified site: Secondary | ICD-10-CM | POA: Diagnosis not present

## 2017-01-07 DIAGNOSIS — E119 Type 2 diabetes mellitus without complications: Secondary | ICD-10-CM | POA: Diagnosis not present

## 2017-01-07 DIAGNOSIS — L89893 Pressure ulcer of other site, stage 3: Secondary | ICD-10-CM | POA: Diagnosis not present

## 2017-01-07 DIAGNOSIS — Z435 Encounter for attention to cystostomy: Secondary | ICD-10-CM | POA: Diagnosis not present

## 2017-01-07 DIAGNOSIS — G35 Multiple sclerosis: Secondary | ICD-10-CM | POA: Diagnosis not present

## 2017-01-07 DIAGNOSIS — I10 Essential (primary) hypertension: Secondary | ICD-10-CM | POA: Diagnosis not present

## 2017-01-09 DIAGNOSIS — E119 Type 2 diabetes mellitus without complications: Secondary | ICD-10-CM | POA: Diagnosis not present

## 2017-01-09 DIAGNOSIS — G35 Multiple sclerosis: Secondary | ICD-10-CM | POA: Diagnosis not present

## 2017-01-09 DIAGNOSIS — L89893 Pressure ulcer of other site, stage 3: Secondary | ICD-10-CM | POA: Diagnosis not present

## 2017-01-09 DIAGNOSIS — M1990 Unspecified osteoarthritis, unspecified site: Secondary | ICD-10-CM | POA: Diagnosis not present

## 2017-01-09 DIAGNOSIS — Z435 Encounter for attention to cystostomy: Secondary | ICD-10-CM | POA: Diagnosis not present

## 2017-01-09 DIAGNOSIS — I10 Essential (primary) hypertension: Secondary | ICD-10-CM | POA: Diagnosis not present

## 2017-01-11 DIAGNOSIS — M1990 Unspecified osteoarthritis, unspecified site: Secondary | ICD-10-CM | POA: Diagnosis not present

## 2017-01-11 DIAGNOSIS — I10 Essential (primary) hypertension: Secondary | ICD-10-CM | POA: Diagnosis not present

## 2017-01-11 DIAGNOSIS — E119 Type 2 diabetes mellitus without complications: Secondary | ICD-10-CM | POA: Diagnosis not present

## 2017-01-11 DIAGNOSIS — L89893 Pressure ulcer of other site, stage 3: Secondary | ICD-10-CM | POA: Diagnosis not present

## 2017-01-11 DIAGNOSIS — G35 Multiple sclerosis: Secondary | ICD-10-CM | POA: Diagnosis not present

## 2017-01-11 DIAGNOSIS — Z435 Encounter for attention to cystostomy: Secondary | ICD-10-CM | POA: Diagnosis not present

## 2017-01-12 ENCOUNTER — Ambulatory Visit (INDEPENDENT_AMBULATORY_CARE_PROVIDER_SITE_OTHER): Payer: Medicare Other | Admitting: Podiatry

## 2017-01-12 ENCOUNTER — Encounter: Payer: Self-pay | Admitting: Podiatry

## 2017-01-12 DIAGNOSIS — L851 Acquired keratosis [keratoderma] palmaris et plantaris: Secondary | ICD-10-CM | POA: Diagnosis not present

## 2017-01-12 DIAGNOSIS — L84 Corns and callosities: Secondary | ICD-10-CM

## 2017-01-12 DIAGNOSIS — Z435 Encounter for attention to cystostomy: Secondary | ICD-10-CM | POA: Diagnosis not present

## 2017-01-12 DIAGNOSIS — G35 Multiple sclerosis: Secondary | ICD-10-CM | POA: Diagnosis not present

## 2017-01-12 DIAGNOSIS — M79672 Pain in left foot: Secondary | ICD-10-CM

## 2017-01-12 DIAGNOSIS — E119 Type 2 diabetes mellitus without complications: Secondary | ICD-10-CM | POA: Diagnosis not present

## 2017-01-12 DIAGNOSIS — M79671 Pain in right foot: Secondary | ICD-10-CM

## 2017-01-12 DIAGNOSIS — I10 Essential (primary) hypertension: Secondary | ICD-10-CM | POA: Diagnosis not present

## 2017-01-12 DIAGNOSIS — L89893 Pressure ulcer of other site, stage 3: Secondary | ICD-10-CM | POA: Diagnosis not present

## 2017-01-12 DIAGNOSIS — M1990 Unspecified osteoarthritis, unspecified site: Secondary | ICD-10-CM | POA: Diagnosis not present

## 2017-01-13 DIAGNOSIS — G35 Multiple sclerosis: Secondary | ICD-10-CM | POA: Diagnosis not present

## 2017-01-13 DIAGNOSIS — I10 Essential (primary) hypertension: Secondary | ICD-10-CM | POA: Diagnosis not present

## 2017-01-13 DIAGNOSIS — Z435 Encounter for attention to cystostomy: Secondary | ICD-10-CM | POA: Diagnosis not present

## 2017-01-13 DIAGNOSIS — L89893 Pressure ulcer of other site, stage 3: Secondary | ICD-10-CM | POA: Diagnosis not present

## 2017-01-13 DIAGNOSIS — E119 Type 2 diabetes mellitus without complications: Secondary | ICD-10-CM | POA: Diagnosis not present

## 2017-01-13 DIAGNOSIS — M1990 Unspecified osteoarthritis, unspecified site: Secondary | ICD-10-CM | POA: Diagnosis not present

## 2017-01-13 MED ORDER — NONFORMULARY OR COMPOUNDED ITEM
2 refills | Status: DC
Start: 2017-01-13 — End: 2017-11-23

## 2017-01-13 NOTE — Progress Notes (Signed)
other

## 2017-01-13 NOTE — Telephone Encounter (Deleted)
Dr Henrene Pastor notes he can not accomodate patient.  Will place on Dr Hilarie Fredrickson who is DOD today 01-13-17.

## 2017-01-14 DIAGNOSIS — M1990 Unspecified osteoarthritis, unspecified site: Secondary | ICD-10-CM | POA: Diagnosis not present

## 2017-01-14 DIAGNOSIS — M47816 Spondylosis without myelopathy or radiculopathy, lumbar region: Secondary | ICD-10-CM | POA: Diagnosis not present

## 2017-01-14 DIAGNOSIS — M5033 Other cervical disc degeneration, cervicothoracic region: Secondary | ICD-10-CM | POA: Diagnosis not present

## 2017-01-14 DIAGNOSIS — E119 Type 2 diabetes mellitus without complications: Secondary | ICD-10-CM | POA: Diagnosis not present

## 2017-01-14 DIAGNOSIS — G35 Multiple sclerosis: Secondary | ICD-10-CM | POA: Diagnosis not present

## 2017-01-14 DIAGNOSIS — G894 Chronic pain syndrome: Secondary | ICD-10-CM | POA: Diagnosis not present

## 2017-01-14 DIAGNOSIS — M5137 Other intervertebral disc degeneration, lumbosacral region: Secondary | ICD-10-CM | POA: Diagnosis not present

## 2017-01-14 DIAGNOSIS — M545 Low back pain: Secondary | ICD-10-CM | POA: Diagnosis not present

## 2017-01-14 DIAGNOSIS — L89893 Pressure ulcer of other site, stage 3: Secondary | ICD-10-CM | POA: Diagnosis not present

## 2017-01-14 DIAGNOSIS — I10 Essential (primary) hypertension: Secondary | ICD-10-CM | POA: Diagnosis not present

## 2017-01-14 DIAGNOSIS — Z435 Encounter for attention to cystostomy: Secondary | ICD-10-CM | POA: Diagnosis not present

## 2017-01-14 DIAGNOSIS — Z79891 Long term (current) use of opiate analgesic: Secondary | ICD-10-CM | POA: Diagnosis not present

## 2017-01-14 DIAGNOSIS — M542 Cervicalgia: Secondary | ICD-10-CM | POA: Diagnosis not present

## 2017-01-14 DIAGNOSIS — M5136 Other intervertebral disc degeneration, lumbar region: Secondary | ICD-10-CM | POA: Diagnosis not present

## 2017-01-16 DIAGNOSIS — Z435 Encounter for attention to cystostomy: Secondary | ICD-10-CM | POA: Diagnosis not present

## 2017-01-16 DIAGNOSIS — E119 Type 2 diabetes mellitus without complications: Secondary | ICD-10-CM | POA: Diagnosis not present

## 2017-01-16 DIAGNOSIS — I10 Essential (primary) hypertension: Secondary | ICD-10-CM | POA: Diagnosis not present

## 2017-01-16 DIAGNOSIS — L89893 Pressure ulcer of other site, stage 3: Secondary | ICD-10-CM | POA: Diagnosis not present

## 2017-01-16 DIAGNOSIS — G35 Multiple sclerosis: Secondary | ICD-10-CM | POA: Diagnosis not present

## 2017-01-16 DIAGNOSIS — M1990 Unspecified osteoarthritis, unspecified site: Secondary | ICD-10-CM | POA: Diagnosis not present

## 2017-01-19 DIAGNOSIS — Z435 Encounter for attention to cystostomy: Secondary | ICD-10-CM | POA: Diagnosis not present

## 2017-01-19 DIAGNOSIS — L89893 Pressure ulcer of other site, stage 3: Secondary | ICD-10-CM | POA: Diagnosis not present

## 2017-01-19 DIAGNOSIS — I10 Essential (primary) hypertension: Secondary | ICD-10-CM | POA: Diagnosis not present

## 2017-01-19 DIAGNOSIS — E119 Type 2 diabetes mellitus without complications: Secondary | ICD-10-CM | POA: Diagnosis not present

## 2017-01-19 DIAGNOSIS — G35 Multiple sclerosis: Secondary | ICD-10-CM | POA: Diagnosis not present

## 2017-01-19 DIAGNOSIS — M1990 Unspecified osteoarthritis, unspecified site: Secondary | ICD-10-CM | POA: Diagnosis not present

## 2017-01-19 NOTE — Telephone Encounter (Signed)
Please place order if appropriate or may I give verbal

## 2017-01-19 NOTE — Telephone Encounter (Signed)
Nurse from [facility] calling to ask Dr. Nolon Rod if pt can have a referral to the [facility]. She would also like an order put in for pt to have a daily change of dressing before she goes to wound center. Please advise at [phone]. Vida Roller has a confidential Vmbox.

## 2017-01-20 NOTE — Telephone Encounter (Signed)
Please give verbal order for dressing changes and to add on wound care I placed the referral for the wound center

## 2017-01-21 DIAGNOSIS — Z435 Encounter for attention to cystostomy: Secondary | ICD-10-CM | POA: Diagnosis not present

## 2017-01-21 DIAGNOSIS — E119 Type 2 diabetes mellitus without complications: Secondary | ICD-10-CM | POA: Diagnosis not present

## 2017-01-21 DIAGNOSIS — I10 Essential (primary) hypertension: Secondary | ICD-10-CM | POA: Diagnosis not present

## 2017-01-21 DIAGNOSIS — L89893 Pressure ulcer of other site, stage 3: Secondary | ICD-10-CM | POA: Diagnosis not present

## 2017-01-21 DIAGNOSIS — G35 Multiple sclerosis: Secondary | ICD-10-CM | POA: Diagnosis not present

## 2017-01-21 DIAGNOSIS — M1990 Unspecified osteoarthritis, unspecified site: Secondary | ICD-10-CM | POA: Diagnosis not present

## 2017-01-22 NOTE — Telephone Encounter (Signed)
[name] advised please f/u on referral

## 2017-01-25 DIAGNOSIS — L89893 Pressure ulcer of other site, stage 3: Secondary | ICD-10-CM | POA: Diagnosis not present

## 2017-01-25 DIAGNOSIS — M1990 Unspecified osteoarthritis, unspecified site: Secondary | ICD-10-CM | POA: Diagnosis not present

## 2017-01-25 DIAGNOSIS — E119 Type 2 diabetes mellitus without complications: Secondary | ICD-10-CM | POA: Diagnosis not present

## 2017-01-25 DIAGNOSIS — I10 Essential (primary) hypertension: Secondary | ICD-10-CM | POA: Diagnosis not present

## 2017-01-25 DIAGNOSIS — Z435 Encounter for attention to cystostomy: Secondary | ICD-10-CM | POA: Diagnosis not present

## 2017-01-25 DIAGNOSIS — G35 Multiple sclerosis: Secondary | ICD-10-CM | POA: Diagnosis not present

## 2017-01-25 NOTE — Telephone Encounter (Signed)
Referral was sent to [facility] 2/16 and is being reviewed for scheduling.

## 2017-01-26 ENCOUNTER — Encounter: Payer: Self-pay | Admitting: Family Medicine

## 2017-01-26 ENCOUNTER — Telehealth: Payer: Self-pay | Admitting: Family Medicine

## 2017-01-26 ENCOUNTER — Ambulatory Visit (INDEPENDENT_AMBULATORY_CARE_PROVIDER_SITE_OTHER): Payer: Medicare Other | Admitting: Family Medicine

## 2017-01-26 VITALS — BP 125/69 | HR 90 | Temp 98.8°F | Resp 18

## 2017-01-26 DIAGNOSIS — I10 Essential (primary) hypertension: Secondary | ICD-10-CM

## 2017-01-26 DIAGNOSIS — R300 Dysuria: Secondary | ICD-10-CM

## 2017-01-26 DIAGNOSIS — L89153 Pressure ulcer of sacral region, stage 3: Secondary | ICD-10-CM | POA: Diagnosis not present

## 2017-01-26 DIAGNOSIS — Z79891 Long term (current) use of opiate analgesic: Secondary | ICD-10-CM | POA: Diagnosis not present

## 2017-01-26 DIAGNOSIS — M79672 Pain in left foot: Secondary | ICD-10-CM | POA: Diagnosis not present

## 2017-01-26 DIAGNOSIS — M79605 Pain in left leg: Secondary | ICD-10-CM | POA: Diagnosis not present

## 2017-01-26 DIAGNOSIS — R5381 Other malaise: Secondary | ICD-10-CM

## 2017-01-26 DIAGNOSIS — Z9289 Personal history of other medical treatment: Secondary | ICD-10-CM

## 2017-01-26 DIAGNOSIS — Z960 Presence of urogenital implants: Secondary | ICD-10-CM

## 2017-01-26 DIAGNOSIS — G894 Chronic pain syndrome: Secondary | ICD-10-CM | POA: Diagnosis not present

## 2017-01-26 DIAGNOSIS — Z978 Presence of other specified devices: Secondary | ICD-10-CM

## 2017-01-26 DIAGNOSIS — M79671 Pain in right foot: Secondary | ICD-10-CM | POA: Diagnosis not present

## 2017-01-26 DIAGNOSIS — M79604 Pain in right leg: Secondary | ICD-10-CM | POA: Diagnosis not present

## 2017-01-26 LAB — POCT URINALYSIS DIP (MANUAL ENTRY)
Bilirubin, UA: NEGATIVE
GLUCOSE UA: NEGATIVE
Ketones, POC UA: NEGATIVE
NITRITE UA: NEGATIVE
PROTEIN UA: NEGATIVE
Spec Grav, UA: 1.01
UROBILINOGEN UA: 0.2
pH, UA: 5

## 2017-01-26 NOTE — Patient Instructions (Addendum)
IF you received an x-ray today, you will receive an invoice from [facility]. Please contact [facility] at [phone] with questions or concerns regarding your invoice.   IF you received labwork today, you will receive an invoice from [facility]. Please contact [facility] at [phone] with questions or concerns regarding your invoice.   Our billing staff will not be able to assist you with questions regarding bills from these companies.  You will be contacted with the lab results as soon as they are available. The fastest way to get your results is to activate your My Chart account. Instructions are located on the last page of this paperwork. If you have not heard from [place] regarding the results in 2 weeks, please contact this office.      Preventing Pressure Injuries Introduction WHAT IS A PRESSURE INJURY? A pressure injury, previously called a bedsore or a pressure ulcer, is an injury to the skin and underlying tissue caused by pressure. A pressure injury can happen when your skin presses against a surface, such as a mattress or wheelchair seat, for too long. The pressure on the blood vessels causes reduced blood flow to your skin. This can eventually cause the skin tissue to die and break down into a wound. Pressure injuries usually develop:  Over bony parts of the body, such as the tailbone, shoulders, elbows, hips, and heels.  Under medical devices, such as respiratory equipment, stockings, tubes, and splints. They can cause pain, muscle damage, and infection. HOW DO PRESSURE INJURIES HAPPEN? Pressure injuries are caused by a lack of blood supply to an area of skin. These injuries begin as a reddened area on the skin and can become an open sore. They can result from intense pressure over a short period of time or from less pressure over a long period of time. Pressure injuries can vary in severity. This condition is more likely to develop in people who:  Are in  the hospital or an extended care facility.  Are bedridden or in a wheelchair.  Have an injury or disease that keeps them from:  Moving normally.  Feeling pain or pressure.  Communicating if they feel pain or pressure.  Have a condition that:  Makes them sleepy or less alert.  Causes poor blood flow.  Need to wear a medical device.  Have poor control of their bladder or bowel functions (incontinence).  Have poor nutrition (malnutrition).  Have had this condition before.  Are of certain ethnicities. People of African American and Latino or Hispanic descent are at higher risk compared to other ethnic groups. HOW CAN I PREVENT PRESSURE INJURIES? Skin Care  Keep your skin clean and dry. Gently pat your skin dry.  Do not rub or massage boney areas of your skin.  Moisturize dry skin.  Use gentle cleansers and skin protectants routinely if you are incontinent.  Check your skin every day for any changes in color and for any new blisters or sores. Make sure to check under and around any medical devices and between skin folds. Have a caregiver do this for you if you are not able. Reducing and Redistributing Pressure  Do not lie or sit in one position for a long time. Move or change position every two hours, or as told by your health care provider.  Use pillows or cushions to redistribute pressure. Ask your health care provider to recommend cushions or pads for you.  Use medical devices that to not rub your skin. Tell your  health care provider if one of your medical devices is causing pain or irritation. Medicines  Take over-the-counter and prescription medicines only as told by your health care provider.  If you were prescribed an antibiotic medicine, take it or apply it as told by your health care provider. Do not stop taking or using the antibiotic even if your condition improves. General Instructions  Be as active as you can every day. Ask your health care provider to  suggest safe exercises or activities.  Work with your health care provider to manage any chronic health conditions.  Eat a healthy diet that includes lots of protein. Ask your health care provider for diet advice.  Drink enough fluid to keep your urine clear or pale yellow.  Do not abuse drugs or alcohol.  Do not smoke.  Keep all follow-up visits as told by your health care provider. This is important. WHAT STEPS WILL BE TAKEN TO PREVENT PRESSURE INJURIES IF I AM IN THE HOSPITAL? Your health care providers:  Will inspect your skin at least daily. Skin under or around medical devices should be checked at least twice a day while you are in the hospital.  May recommend that you use certain types of bedding to help prevent them. These may include a pad, mattress, or chair cushion that is filled with gel, air, water, or foam.  Will evaluate your nutrition and consult a diet specialist (dietician), if needed.  Will inspect and change any wound dressings regularly.  May help you move into different positions every few hours.  Will adjust any medical devices and braces as needed to limit pressure on your skin.  Will keep your skin clean and dry.  May use gentle cleansers and skin protectants, if you are incontinent.  Will moisturize any dry skin. Make sure that you let your health care provider know if you feel or see any changes in your skin. This information is not intended to replace advice given to you by your health care provider. Make sure you discuss any questions you have with your health care provider. Document Released: 12/30/2004 Document Revised: 04/29/2016 Document Reviewed: 08/28/2015  2017 Elsevier

## 2017-01-26 NOTE — Progress Notes (Signed)
Chief Complaint  Patient presents with  . Follow-up  . Catherter    HPI   Indwelling foley catheter Pt reports that her foley has been draining. She states that her urine has been cloudy so she wanted to check her urine today.  She denies fevers or chills She reports some burning at the urethra She denies any flank pain or back pain  Decubitus Ulcers Pt was referred to wound care for decubitus ulcers She has supplies and her home health has been helping with wound changes No purulent drainage She has an appointment at the wound clinic March 9.  She reports that her home nurse told her that her wound has gone from stage I to stage III.  Hypertension BP Readings from Last 3 Encounters:  01/26/17 125/69  01/05/17 90/60  12/24/16 130/78   Pt reports that she has been taking hctz every other day She reports that she has no side effects of her hctz She does not have increased lower extremity edema   Past Medical History:  Diagnosis Date  . Arthritis   . Decubitus ulcers   . Diabetes mellitus without complication ([place])   . Hypertension   . MS (multiple sclerosis) ([place])     Current Outpatient Prescriptions  Medication Sig Dispense Refill  . Adhesive Tape (PAPER TAPE 1"X10YD) TAPE Apply to dressings as needed 4 each 6  . atorvastatin (LIPITOR) 20 MG tablet Take 20 mg by mouth daily.    . baclofen (LIORESAL) 20 MG tablet Take 20 mg by mouth 4 (four) times daily.    . Catheters (DOVER UNIVERSAL FOLEY TRAY) KIT Use foley tray kit to change catheter every 3 weeks or as needed 15 each 1  . Catheters (FOLEY CATHETER 2-WAY) MISC Use to aspirate urine suprapubic 15 each 1  . cephALEXin (KEFLEX) 500 MG capsule Take 1 capsule (500 mg total) by mouth 3 (three) times daily. 30 capsule 0  . Cholecalciferol (D3-1000) 1000 units capsule Take 1,000 Units by mouth daily.    . Control Gel Formula Dressing (DUODERM CGF DRESSING) MISC Apply 1 each topically daily as needed. 5 each 11  .  cyanocobalamin 500 MCG tablet Take 500 mcg by mouth daily.    . Disposable Gloves MISC Use disposable gloves to change catheter as needed 200 each 11  . FORTAMET 1000 MG 24 hr tablet Take 1,000 mg by mouth 2 (two) times daily.  0  . furosemide (LASIX) 20 MG tablet Take 20 mg by mouth daily.    Marland Kitchen glipiZIDE (GLUCOTROL) 5 MG tablet Take 5 mg by mouth daily before lunch.    . Hydroactive Dressings (DUODERM HYDROACTIVE) PSTE Apply to pressure wounds as needed 60 g 11  . hydrochlorothiazide (HYDRODIURIL) 25 MG tablet Take 25 mg by mouth daily.    . Incontinence Supply Disposable (TENA FLEX 16 PLUS) MISC Use daily as needed 30 each 11  . Incontinence Supply Disposable (TENA PROTECT UNDERWEAR PLS/XLG) MISC Use for incontinence. 30 each 11  . Interferon Beta-1b (BETASERON) 0.3 MG KIT injection Inject 0.3 mg into the skin every other day.     . lamoTRIgine (LAMICTAL) 100 MG tablet Take 1/2 pill po x 5 days, then 1/2 pill po bid x 5 days, then 1/2 pill po tid x 5 days then 1 pill po bid 60 tablet 11  . metFORMIN (GLUCOPHAGE) 1000 MG tablet Take 1,000 mg by mouth 2 (two) times daily with a meal.    . NONFORMULARY OR COMPOUNDED ITEM [facility]:  Peripheral Neuropathy Cream - Bupivacaine 1%, Doxepin 3%, Gabapentin 6%, Pentoxifylline 3%, Topiramate 1%, apply 1-2 grams to affected area 3-4 times daily. 120 each 2  . Ostomy Supplies (NEW IMAGE SKIN/FLANGE/TAPE) MISC 1 application by Does not apply route daily as needed. 30 each 11  . oxyCODONE (OXYCONTIN) 20 mg 12 hr tablet Take 1 tablet (20 mg total) by mouth 3 (three) times daily as needed (pain). 90 tablet 0  . oxyCODONE-acetaminophen (PERCOCET) 10-325 MG tablet Take 1 tablet by mouth every 8 (eight) hours as needed for pain. 90 tablet 0  . potassium chloride (K-DUR,KLOR-CON) 10 MEQ tablet Take 30 mEq by mouth 2 (two) times daily.    . Syringe, Disposable, (B-D SYRINGE LUER-LOK 30CC) 30 ML MISC Use to change catheter 15 each 0  . Wound Dressings (GRX  HYDROGEL GAUZE 4X4) PADS Use gauze to clean wound daily as needed 200 each 11   No current facility-administered medications for this visit.     Allergies: No Known Allergies  Past Surgical History:  Procedure Laterality Date  . SUPRAPUBIC CATHETER INSERTION  2009    Social History   Social History  . Marital status: Single    Spouse name: N/A  . Number of children: N/A  . Years of education: N/A   Social History Main Topics  . Smoking status: Never Smoker  . Smokeless tobacco: Never Used  . Alcohol use No  . Drug use: No  . Sexual activity: Not Asked   Other Topics Concern  . None   Social History Narrative  . None    ROS  Objective: Vitals:   01/26/17 1346  BP: 125/69  Pulse: 90  Resp: 18  Temp: 98.8 F (37.1 C)  TempSrc: Oral  SpO2: 96%    Physical Exam  Constitutional: She is oriented to person, place, and time. She appears well-developed and well-nourished.  Morbidly obese  HENT:  Head: Normocephalic and atraumatic.  Eyes: Conjunctivae and EOM are normal.  Cardiovascular: Normal rate, regular rhythm and normal heart sounds.   No murmur heard. Pulmonary/Chest: Effort normal and breath sounds normal. No respiratory distress. She has no wheezes.  Abdominal: Soft. She exhibits no distension. There is no tenderness.  Suprapubic catheter change, tunnel clean, urine sent for culture  Neurological: She is alert and oriented to person, place, and time.  Psychiatric: She has a normal mood and affect. Her behavior is normal. Judgment and thought content normal.      14:23 3wk ago 26moago 274mogo   14:23   Color, UA yellow yellow    yellow    Clarity, UA clear clear    clear    Glucose, UA negative negative   NEGATIVER  negative    Bilirubin, UA negative negative    negative    Ketones, POC UA negative negative    negative    Spec Grav, UA  1.010    1.010    Blood, UA negative large   >30R    trace-intact     pH, UA  5.0    5.5    Protein Ur, POC  negative negative    negative    Urobilinogen, UA  0.2    0.2    Nitrite, UA Negative Negative    Positive     Leukocytes, UA Negative small (1+)         Assessment and Plan RoTaniquaas seen today for follow-up and catherter.  Diagnoses and all orders for this visit:  Decubitus  ulcer of sacral region, stage 3 ([place])- follow up with wound clinic  Physical deconditioning- continue with wound care and home health  Chronic indwelling Foley catheter- changed indwelling catheter today Pt tolerated well 10cc used to inflate bulb Urine was sent for lab AFTER catheter change took place  -     POCT urinalysis dipstick -     Urine Microscopic -     Urine culture  Dysuria- pt has burning at urethra thus will send for culture as pt is high risk for UTI -     Urine culture  Essential hypertension - bp improved with hctz every other day, no more hypotension    Danaysia Rader A Nolon Rod

## 2017-01-26 NOTE — Telephone Encounter (Signed)
Left message that UA did not show UTI Sent urine for culture.

## 2017-01-26 NOTE — Progress Notes (Signed)
Patient ID: [name], female   DOB: [birth year], 56 y.o.   MRN: [id number]   Subjective: Patient presents to the office today for chief complaint of painful callus lesions of the feet. Patient states that the pain is ongoing and is affecting their ability to ambulate without pain. Patient presents today for further treatment and evaluation.   Patient is wheelchair-bound secondary to multiple sclerosis.   Objective:  Physical Exam General: Alert and oriented x3 in no acute distress  Dermatology: Hyperkeratotic lesion present on the weightbearing surface of the left foot. Pain on palpation with a central nucleated core noted.  Skin is warm, dry and supple bilateral lower extremities. Negative for open lesions or macerations.  Vascular: Palpable pedal pulses bilaterally. No edema or erythema noted. Capillary refill within normal limits.  Neurological: Epicritic and protective threshold grossly intact bilaterally.   Musculoskeletal Exam: Pain on palpation at the keratotic lesion noted. Range of motion within normal limits bilateral. Muscle strength 5/5 in all groups bilateral.  Assessment: #1 diabetes mellitus with neuropathy #2symptomatic callus plantar aspect left foot  #3 multiple sclerosis   Plan of Care:  #1 Patient evaluated #2 Excisional debridement of  keratoic lesion using a chisel blade was performed without incident.  #3 Treated area(s) with Salinocaine and dressed with light dressing. #4 prescription for peripheral neuropathy pain cream dispensed through [place] #5 Patient is to return to the clinic PRN.   Edrick Kins, DPM [facility]  Dr. Edrick Kins, [address]                                        [place], [place] [address]                Office [phone]  Fax [phone]

## 2017-01-27 DIAGNOSIS — I10 Essential (primary) hypertension: Secondary | ICD-10-CM | POA: Diagnosis not present

## 2017-01-27 DIAGNOSIS — G35 Multiple sclerosis: Secondary | ICD-10-CM | POA: Diagnosis not present

## 2017-01-27 DIAGNOSIS — M1990 Unspecified osteoarthritis, unspecified site: Secondary | ICD-10-CM | POA: Diagnosis not present

## 2017-01-27 DIAGNOSIS — E119 Type 2 diabetes mellitus without complications: Secondary | ICD-10-CM | POA: Diagnosis not present

## 2017-01-27 DIAGNOSIS — Z435 Encounter for attention to cystostomy: Secondary | ICD-10-CM | POA: Diagnosis not present

## 2017-01-27 DIAGNOSIS — L89893 Pressure ulcer of other site, stage 3: Secondary | ICD-10-CM | POA: Diagnosis not present

## 2017-01-27 LAB — URINALYSIS, MICROSCOPIC ONLY: RBC, UA: >30 /hpf — AB (ref 0–?)

## 2017-01-28 ENCOUNTER — Telehealth: Payer: Self-pay

## 2017-01-28 DIAGNOSIS — E119 Type 2 diabetes mellitus without complications: Secondary | ICD-10-CM | POA: Diagnosis not present

## 2017-01-28 DIAGNOSIS — I10 Essential (primary) hypertension: Secondary | ICD-10-CM | POA: Diagnosis not present

## 2017-01-28 DIAGNOSIS — L89893 Pressure ulcer of other site, stage 3: Secondary | ICD-10-CM | POA: Diagnosis not present

## 2017-01-28 DIAGNOSIS — M1990 Unspecified osteoarthritis, unspecified site: Secondary | ICD-10-CM | POA: Diagnosis not present

## 2017-01-28 DIAGNOSIS — G35 Multiple sclerosis: Secondary | ICD-10-CM | POA: Diagnosis not present

## 2017-01-28 DIAGNOSIS — Z435 Encounter for attention to cystostomy: Secondary | ICD-10-CM | POA: Diagnosis not present

## 2017-01-28 LAB — URINE CULTURE

## 2017-01-28 MED ORDER — CIPROFLOXACIN HCL 500 MG PO TABS: 500.0000 mg | ORAL_TABLET | Freq: Two times a day (BID) | ORAL | 0 refills | Status: AC

## 2017-01-28 NOTE — Telephone Encounter (Signed)
Patient is asking Nolon Rod to call her back about her lab results.  Call back number [phone]

## 2017-01-28 NOTE — Telephone Encounter (Signed)
See abnormal results 

## 2017-01-28 NOTE — Telephone Encounter (Signed)
Spoke to [employer], patient's daughter, that pt has klebsiella uti. Sent in cipro bid for 7 days. If her urine remains cloudy after next week to come back. Susceptible to cipro Verbalized understanding

## 2017-01-30 DIAGNOSIS — M1990 Unspecified osteoarthritis, unspecified site: Secondary | ICD-10-CM | POA: Diagnosis not present

## 2017-01-30 DIAGNOSIS — E119 Type 2 diabetes mellitus without complications: Secondary | ICD-10-CM | POA: Diagnosis not present

## 2017-01-30 DIAGNOSIS — Z435 Encounter for attention to cystostomy: Secondary | ICD-10-CM | POA: Diagnosis not present

## 2017-01-30 DIAGNOSIS — L89893 Pressure ulcer of other site, stage 3: Secondary | ICD-10-CM | POA: Diagnosis not present

## 2017-01-30 DIAGNOSIS — I10 Essential (primary) hypertension: Secondary | ICD-10-CM | POA: Diagnosis not present

## 2017-01-30 DIAGNOSIS — G35 Multiple sclerosis: Secondary | ICD-10-CM | POA: Diagnosis not present

## 2017-02-02 DIAGNOSIS — Z435 Encounter for attention to cystostomy: Secondary | ICD-10-CM | POA: Diagnosis not present

## 2017-02-02 DIAGNOSIS — N318 Other neuromuscular dysfunction of bladder: Secondary | ICD-10-CM | POA: Diagnosis not present

## 2017-02-02 DIAGNOSIS — G35 Multiple sclerosis: Secondary | ICD-10-CM | POA: Diagnosis not present

## 2017-02-02 DIAGNOSIS — M79672 Pain in left foot: Secondary | ICD-10-CM | POA: Diagnosis not present

## 2017-02-02 DIAGNOSIS — L89893 Pressure ulcer of other site, stage 3: Secondary | ICD-10-CM | POA: Diagnosis not present

## 2017-02-02 DIAGNOSIS — E119 Type 2 diabetes mellitus without complications: Secondary | ICD-10-CM | POA: Diagnosis not present

## 2017-02-02 DIAGNOSIS — M1990 Unspecified osteoarthritis, unspecified site: Secondary | ICD-10-CM | POA: Diagnosis not present

## 2017-02-02 DIAGNOSIS — I10 Essential (primary) hypertension: Secondary | ICD-10-CM | POA: Diagnosis not present

## 2017-02-02 DIAGNOSIS — M79605 Pain in left leg: Secondary | ICD-10-CM | POA: Diagnosis not present

## 2017-02-02 DIAGNOSIS — G894 Chronic pain syndrome: Secondary | ICD-10-CM | POA: Diagnosis not present

## 2017-02-02 DIAGNOSIS — M79604 Pain in right leg: Secondary | ICD-10-CM | POA: Diagnosis not present

## 2017-02-02 DIAGNOSIS — M79671 Pain in right foot: Secondary | ICD-10-CM | POA: Diagnosis not present

## 2017-02-02 DIAGNOSIS — Z79891 Long term (current) use of opiate analgesic: Secondary | ICD-10-CM | POA: Diagnosis not present

## 2017-02-04 DIAGNOSIS — M1990 Unspecified osteoarthritis, unspecified site: Secondary | ICD-10-CM | POA: Diagnosis not present

## 2017-02-04 DIAGNOSIS — G35 Multiple sclerosis: Secondary | ICD-10-CM | POA: Diagnosis not present

## 2017-02-04 DIAGNOSIS — Z435 Encounter for attention to cystostomy: Secondary | ICD-10-CM | POA: Diagnosis not present

## 2017-02-04 DIAGNOSIS — E119 Type 2 diabetes mellitus without complications: Secondary | ICD-10-CM | POA: Diagnosis not present

## 2017-02-04 DIAGNOSIS — L89893 Pressure ulcer of other site, stage 3: Secondary | ICD-10-CM | POA: Diagnosis not present

## 2017-02-04 DIAGNOSIS — I10 Essential (primary) hypertension: Secondary | ICD-10-CM | POA: Diagnosis not present

## 2017-02-07 DIAGNOSIS — L89893 Pressure ulcer of other site, stage 3: Secondary | ICD-10-CM | POA: Diagnosis not present

## 2017-02-07 DIAGNOSIS — E119 Type 2 diabetes mellitus without complications: Secondary | ICD-10-CM | POA: Diagnosis not present

## 2017-02-07 DIAGNOSIS — Z435 Encounter for attention to cystostomy: Secondary | ICD-10-CM | POA: Diagnosis not present

## 2017-02-07 DIAGNOSIS — G35 Multiple sclerosis: Secondary | ICD-10-CM | POA: Diagnosis not present

## 2017-02-07 DIAGNOSIS — I10 Essential (primary) hypertension: Secondary | ICD-10-CM | POA: Diagnosis not present

## 2017-02-07 DIAGNOSIS — M1990 Unspecified osteoarthritis, unspecified site: Secondary | ICD-10-CM | POA: Diagnosis not present

## 2017-02-09 DIAGNOSIS — G35 Multiple sclerosis: Secondary | ICD-10-CM | POA: Diagnosis not present

## 2017-02-09 DIAGNOSIS — M1990 Unspecified osteoarthritis, unspecified site: Secondary | ICD-10-CM | POA: Diagnosis not present

## 2017-02-09 DIAGNOSIS — L89893 Pressure ulcer of other site, stage 3: Secondary | ICD-10-CM | POA: Diagnosis not present

## 2017-02-09 DIAGNOSIS — I10 Essential (primary) hypertension: Secondary | ICD-10-CM | POA: Diagnosis not present

## 2017-02-09 DIAGNOSIS — E119 Type 2 diabetes mellitus without complications: Secondary | ICD-10-CM | POA: Diagnosis not present

## 2017-02-09 DIAGNOSIS — Z435 Encounter for attention to cystostomy: Secondary | ICD-10-CM | POA: Diagnosis not present

## 2017-02-10 ENCOUNTER — Telehealth: Payer: Self-pay | Admitting: Family Medicine

## 2017-02-10 ENCOUNTER — Other Ambulatory Visit: Payer: Self-pay | Admitting: Family Medicine

## 2017-02-10 NOTE — Telephone Encounter (Signed)
Line busy

## 2017-02-10 NOTE — Telephone Encounter (Signed)
THIS CALL IS FROM [facility] FOR [facility]. STALLINGS: PATIENT IS REQUESTING A REFILL ON BACLOFEN 20 MG. LAST REFILLED ON 01/31/2017. PHARMACY PHONE [phone]   [facility]

## 2017-02-10 NOTE — Telephone Encounter (Signed)
Pt home health nurse is calling needing verbal orders   Best number for nurse is [phone]

## 2017-02-11 ENCOUNTER — Encounter (HOSPITAL_BASED_OUTPATIENT_CLINIC_OR_DEPARTMENT_OTHER): Payer: Medicare Other | Attending: Internal Medicine

## 2017-02-11 DIAGNOSIS — E1140 Type 2 diabetes mellitus with diabetic neuropathy, unspecified: Secondary | ICD-10-CM | POA: Diagnosis not present

## 2017-02-11 DIAGNOSIS — L89313 Pressure ulcer of right buttock, stage 3: Secondary | ICD-10-CM | POA: Diagnosis not present

## 2017-02-11 DIAGNOSIS — G35 Multiple sclerosis: Secondary | ICD-10-CM | POA: Diagnosis not present

## 2017-02-11 DIAGNOSIS — I10 Essential (primary) hypertension: Secondary | ICD-10-CM | POA: Diagnosis not present

## 2017-02-11 DIAGNOSIS — Z435 Encounter for attention to cystostomy: Secondary | ICD-10-CM | POA: Diagnosis not present

## 2017-02-11 DIAGNOSIS — M1990 Unspecified osteoarthritis, unspecified site: Secondary | ICD-10-CM | POA: Diagnosis not present

## 2017-02-11 DIAGNOSIS — E119 Type 2 diabetes mellitus without complications: Secondary | ICD-10-CM | POA: Diagnosis not present

## 2017-02-11 DIAGNOSIS — L89893 Pressure ulcer of other site, stage 3: Secondary | ICD-10-CM | POA: Diagnosis not present

## 2017-02-11 NOTE — Telephone Encounter (Signed)
Line busy

## 2017-02-14 DIAGNOSIS — E119 Type 2 diabetes mellitus without complications: Secondary | ICD-10-CM | POA: Diagnosis not present

## 2017-02-14 DIAGNOSIS — M1990 Unspecified osteoarthritis, unspecified site: Secondary | ICD-10-CM | POA: Diagnosis not present

## 2017-02-14 DIAGNOSIS — L89893 Pressure ulcer of other site, stage 3: Secondary | ICD-10-CM | POA: Diagnosis not present

## 2017-02-14 DIAGNOSIS — G35 Multiple sclerosis: Secondary | ICD-10-CM | POA: Diagnosis not present

## 2017-02-14 DIAGNOSIS — Z435 Encounter for attention to cystostomy: Secondary | ICD-10-CM | POA: Diagnosis not present

## 2017-02-14 DIAGNOSIS — I10 Essential (primary) hypertension: Secondary | ICD-10-CM | POA: Diagnosis not present

## 2017-02-16 ENCOUNTER — Ambulatory Visit (INDEPENDENT_AMBULATORY_CARE_PROVIDER_SITE_OTHER): Payer: Medicare Other | Admitting: Family Medicine

## 2017-02-16 ENCOUNTER — Encounter: Payer: Self-pay | Admitting: Family Medicine

## 2017-02-16 ENCOUNTER — Other Ambulatory Visit: Payer: Self-pay | Admitting: Family Medicine

## 2017-02-16 ENCOUNTER — Other Ambulatory Visit: Payer: Self-pay

## 2017-02-16 ENCOUNTER — Ambulatory Visit: Payer: Medicare Other | Admitting: Family Medicine

## 2017-02-16 VITALS — BP 136/79 | HR 90 | Temp 98.5°F | Resp 18

## 2017-02-16 DIAGNOSIS — E119 Type 2 diabetes mellitus without complications: Secondary | ICD-10-CM | POA: Diagnosis not present

## 2017-02-16 DIAGNOSIS — Z9289 Personal history of other medical treatment: Secondary | ICD-10-CM

## 2017-02-16 DIAGNOSIS — N390 Urinary tract infection, site not specified: Secondary | ICD-10-CM | POA: Diagnosis not present

## 2017-02-16 DIAGNOSIS — Z978 Presence of other specified devices: Secondary | ICD-10-CM

## 2017-02-16 DIAGNOSIS — G35 Multiple sclerosis: Secondary | ICD-10-CM | POA: Diagnosis not present

## 2017-02-16 DIAGNOSIS — Z960 Presence of urogenital implants: Principal | ICD-10-CM

## 2017-02-16 DIAGNOSIS — I10 Essential (primary) hypertension: Secondary | ICD-10-CM

## 2017-02-16 DIAGNOSIS — H524 Presbyopia: Secondary | ICD-10-CM | POA: Diagnosis not present

## 2017-02-16 DIAGNOSIS — Z435 Encounter for attention to cystostomy: Secondary | ICD-10-CM | POA: Diagnosis not present

## 2017-02-16 DIAGNOSIS — H52203 Unspecified astigmatism, bilateral: Secondary | ICD-10-CM | POA: Diagnosis not present

## 2017-02-16 DIAGNOSIS — H3589 Other specified retinal disorders: Secondary | ICD-10-CM | POA: Diagnosis not present

## 2017-02-16 DIAGNOSIS — M1990 Unspecified osteoarthritis, unspecified site: Secondary | ICD-10-CM | POA: Diagnosis not present

## 2017-02-16 DIAGNOSIS — Z0289 Encounter for other administrative examinations: Secondary | ICD-10-CM

## 2017-02-16 DIAGNOSIS — L89893 Pressure ulcer of other site, stage 3: Secondary | ICD-10-CM | POA: Diagnosis not present

## 2017-02-16 DIAGNOSIS — E1142 Type 2 diabetes mellitus with diabetic polyneuropathy: Secondary | ICD-10-CM | POA: Diagnosis not present

## 2017-02-16 MED ORDER — BACLOFEN 20 MG PO TABS: 20.0000 mg | ORAL_TABLET | Freq: Four times a day (QID) | ORAL | 2 refills | Status: DC

## 2017-02-16 NOTE — Telephone Encounter (Signed)
Noted  

## 2017-02-16 NOTE — Telephone Encounter (Signed)
Pt has been d/c from pt 02/11/17  Faxed order for equipment needed

## 2017-02-16 NOTE — Progress Notes (Signed)
Chief Complaint  Patient presents with  . Other    catherter change  . other    has paperwork to be signed    HPI   Essential Hypertension Blood pressures improved with diuretic change No dizziness or fatigue No chest pains or palpitations  BP Readings from Last 3 Encounters:  02/16/17 136/79  01/26/17 125/69  01/05/17 90/60    Decubitus Ulcers Pt has been referred to wound care  She reports that her wound is doing very well She has a few more appointments coming up with wound clinic   Past Medical History:  Diagnosis Date  . Arthritis   . Decubitus ulcers   . Diabetes mellitus without complication ([place])   . Hypertension   . MS (multiple sclerosis) ([place])     Current Outpatient Prescriptions  Medication Sig Dispense Refill  . Adhesive Tape (PAPER TAPE 1"X10YD) TAPE Apply to dressings as needed 4 each 6  . atorvastatin (LIPITOR) 20 MG tablet Take 20 mg by mouth daily.    . Catheters (DOVER UNIVERSAL FOLEY TRAY) KIT Use foley tray kit to change catheter every 3 weeks or as needed 15 each 1  . Catheters (FOLEY CATHETER 2-WAY) MISC Use to aspirate urine suprapubic 15 each 1  . cephALEXin (KEFLEX) 500 MG capsule Take 1 capsule (500 mg total) by mouth 3 (three) times daily. 30 capsule 0  . Cholecalciferol (D3-1000) 1000 units capsule Take 1,000 Units by mouth daily.    . Control Gel Formula Dressing (DUODERM CGF DRESSING) MISC Apply 1 each topically daily as needed. 5 each 11  . cyanocobalamin 500 MCG tablet Take 500 mcg by mouth daily.    . Disposable Gloves MISC Use disposable gloves to change catheter as needed 200 each 11  . Exenatide ER 2 MG/0.85ML AUIJ Inject 2 mg into the skin.    . FORTAMET 1000 MG 24 hr tablet Take 1,000 mg by mouth 2 (two) times daily.  0  . furosemide (LASIX) 20 MG tablet Take 20 mg by mouth daily.    Marland Kitchen glipiZIDE (GLUCOTROL) 5 MG tablet Take 5 mg by mouth daily before lunch.    . Hydroactive Dressings (DUODERM HYDROACTIVE) PSTE Apply to  pressure wounds as needed 60 g 11  . hydrochlorothiazide (HYDRODIURIL) 25 MG tablet Take 25 mg by mouth daily.    . Incontinence Supply Disposable (TENA FLEX 16 PLUS) MISC Use daily as needed 30 each 11  . Incontinence Supply Disposable (TENA PROTECT UNDERWEAR PLS/XLG) MISC Use for incontinence. 30 each 11  . Interferon Beta-1b (BETASERON) 0.3 MG KIT injection Inject 0.3 mg into the skin every other day.     . lamoTRIgine (LAMICTAL) 100 MG tablet Take 1/2 pill po x 5 days, then 1/2 pill po bid x 5 days, then 1/2 pill po tid x 5 days then 1 pill po bid 60 tablet 11  . metFORMIN (GLUCOPHAGE) 1000 MG tablet Take 1,000 mg by mouth 2 (two) times daily with a meal.    . MOVANTIK 25 MG TABS tablet Take 25 mg by mouth every morning.  4  . NONFORMULARY OR COMPOUNDED ITEM [facility]:  Peripheral Neuropathy Cream - Bupivacaine 1%, Doxepin 3%, Gabapentin 6%, Pentoxifylline 3%, Topiramate 1%, apply 1-2 grams to affected area 3-4 times daily. 120 each 2  . nystatin ointment (MYCOSTATIN) apply to affected area(s) by topical route TWICE DAILY  2  . Ostomy Supplies (NEW IMAGE SKIN/FLANGE/TAPE) MISC 1 application by Does not apply route daily as needed. 30 each 11  .  oxyCODONE (OXYCONTIN) 20 mg 12 hr tablet Take 1 tablet (20 mg total) by mouth 3 (three) times daily as needed (pain). 90 tablet 0  . oxyCODONE-acetaminophen (PERCOCET) 10-325 MG tablet Take 1 tablet by mouth every 8 (eight) hours as needed for pain. 90 tablet 0  . potassium chloride (K-DUR,KLOR-CON) 10 MEQ tablet Take 30 mEq by mouth 2 (two) times daily.    . Syringe, Disposable, (B-D SYRINGE LUER-LOK 30CC) 30 ML MISC Use to change catheter 15 each 0  . Wound Dressings (GRX HYDROGEL GAUZE 4X4) PADS Use gauze to clean wound daily as needed 200 each 11  . baclofen (LIORESAL) 20 MG tablet Take 1 tablet (20 mg total) by mouth 4 (four) times daily. 120 each 2   No current facility-administered medications for this visit.     Allergies: No Known  Allergies  Past Surgical History:  Procedure Laterality Date  . SUPRAPUBIC CATHETER INSERTION  2009    Social History   Social History  . Marital status: Single    Spouse name: N/A  . Number of children: N/A  . Years of education: N/A   Social History Main Topics  . Smoking status: Never Smoker  . Smokeless tobacco: Never Used  . Alcohol use No  . Drug use: No  . Sexual activity: Not Asked   Other Topics Concern  . None   Social History Narrative  . None    Review of Systems  Constitutional: Negative for chills, fever and weight loss.  Cardiovascular: Negative for chest pain and palpitations.  Gastrointestinal: Negative for abdominal pain, nausea and vomiting.  Genitourinary: Negative for flank pain and hematuria.  Neurological: Negative for dizziness, tingling and headaches.    Objective: Vitals:   02/16/17 1609  BP: 136/79  Pulse: 90  Resp: 18  Temp: 98.5 F (36.9 C)  TempSrc: Oral  SpO2: 98%    Physical Exam Constitutional: She is oriented to person, place, and time. She appears well-developed and well-nourished.  Morbidly obese  HENT:  Head: Normocephalic and atraumatic.  Eyes: Conjunctivae and EOM are normal.  Cardiovascular: Normal rate, regular rhythm and normal heart sounds.   No murmur heard. Pulmonary/Chest: Effort normal and breath sounds normal. No respiratory distress. She has no wheezes.  Abdominal: Soft. She exhibits no distension. There is no tenderness.  Suprapubic catheter change, tunnel clean, urine sent for culture  Neurological: She is alert and oriented to person, place, and time.  Psychiatric: She has a normal mood and affect. Her behavior is normal. Judgment and thought content normal.    Assessment and Plan [name] was seen today for other and other.  Diagnoses and all orders for this visit:  Chronic indwelling Foley catheter- catheter changed in the office -     Urine culture  Essential hypertension- bp in good  range  Recurrent UTI - will send for culture -     Urine culture  Pain management contract agreement- completed pain contract today and sent for scanning Discussed that she should follow up with [facility] for her next refill and that she should make them aware that we need a copy of her office visit and drug screen She should anticipate random drug screens and annual check ins with pain clinic Due to cost the drug screen here was cancelled Will clarify with lab what would be covered for insurance -     Drug Screen, Urine   Sheila Gervasi A Nolon Rod

## 2017-02-16 NOTE — Telephone Encounter (Signed)
lmtcb

## 2017-02-16 NOTE — Patient Instructions (Addendum)
IF you received an x-ray today, you will receive an invoice from [facility]. Please contact [facility] at [phone] with questions or concerns regarding your invoice.   IF you received labwork today, you will receive an invoice from [place]. Please contact [facility] at [phone] with questions or concerns regarding your invoice.   Our billing staff will not be able to assist you with questions regarding bills from these companies.  You will be contacted with the lab results as soon as they are available. The fastest way to get your results is to activate your My Chart account. Instructions are located on the last page of this paperwork. If you have not heard from [place] regarding the results in 2 weeks, please contact this office.      Opioid Overdose Opioids are substances that relieve pain by binding to pain receptors in your brain and spinal cord. Opioids include illegal drugs, such as heroin, as well as prescription pain medicines.An opioid overdose happens when you take too much of an opioid substance. This can happen with any type of opioid, including:  Heroin.  Morphine.  Codeine.  Methadone.  Oxycodone.  Hydrocodone.  Fentanyl.  Hydromorphone.  Buprenorphine. The effects of an overdose can be mild, dangerous, or even deadly. Opioid overdose is a medical emergency. What are the causes? This condition may be caused by:  Taking too much of an opioid by accident.  Taking too much of an opioid on purpose.  An error made by a health care provider who prescribes a medicine.  An error made by the pharmacist who fills the prescription order.  Using more than one substance that contains opioids at the same time.  Mixing an opioid with a substance that affects your heart, breathing, or blood pressure. These include alcohol, tranquilizers, sleeping pills, illegal drugs, and some over-the-counter medicines. What increases the risk? This  condition is more likely in:  Children. They may be attracted to colorful pills. Because of a child's small size, even a small amount of a drug can be dangerous.  Elderly people. They may be taking many different drugs. Elderly people may have difficulty reading labels or remembering when they last took their medicine.  People who take an opioid on a long-term basis.  People who use:  Illegal drugs.  Other substances, including alcohol, while using an opioid.  People who have:  A history of drug or alcohol abuse.  Certain mental health conditions.  People who take opioids that are not prescribed for them. What are the signs or symptoms? Symptoms of this condition depend on the type of opioid and the amount that was taken. Common symptoms include:  Sleepiness or difficulty waking from sleep.  Confusion.  Slurred speech.  Slowed breathing and a slow pulse.  Nausea and vomiting.  Abnormally small pupils. Signs and symptoms that require emergency treatment include:  Cold, clammy, and pale skin.  Blue lips and fingernails.  Vomiting.  Gurgling sounds in the throat.  A pulse that is very slow or difficult to detect.  Breathing that is very slow, noisy, or difficult to detect.  Limp body.  Inability to respond to speech or be awakened from sleep (stupor). How is this diagnosed? This condition is diagnosed based on your symptoms. It is important to tell your health care provider:  All of the opioidsthat you took.  When you took the opioids.  Whether you were drinking alcohol or using other substances. Your health care provider will do a  physical exam. This exam may include:  Checking and monitoring your heart rate and rhythm, your breathing rate and depth, your temperature, and your blood pressure (vital signs).  Checking for abnormally small pupils.  Measuring oxygen levels in your blood. You may also have blood tests or urine tests. How is this  treated? Supporting your vital signs and your breathing is the first step in treating an opioid overdose. Treatment may also include:  Giving fluids and minerals (electrolytes) through an IV tube.  Inserting a breathing tube (endotracheal tube) in your airway to help you breathe.  Giving oxygen.  Passing a tube through your nose and into your stomach (NG tube, or nasogastric tube) to wash out your stomach.  Giving medicines that:  Increase your blood pressure.  Absorb any opioid that is in your digestive system.  Reverse the effects of the opioid (naloxone).  Ongoing counseling and mental health support if you intentionally overdosed or used an illegal drug. Follow these instructions at home:  Take over-the-counter and prescription medicines only as told by your health care provider. Always ask your health care provider about possible side effects and interactions of any new medicine that you start taking.  Keep a list of all of the medicines that you take, including over-the-counter medicines. Bring this list with you to all of your medical visits.  Drink enough fluid to keep your urine clear or pale yellow.  Keep all follow-up visits as told by your health care provider. This is important. How is this prevented?  Get help if you are struggling with:  Alcohol or drug use.  Depression or another mental health problem.  Keep the phone number of your local poison control center near your phone or on your cell phone.  Store all medicines in safety containers that are out of the reach of children.  Read the drug inserts that come with your medicines.  Do not drink alcohol when taking opioids.  Do not use illegal drugs.  Do not take opioid medicines that are not prescribed for you. Contact a health care provider if:  Your symptoms return.  You develop new symptoms or side effects when you are taking medicines. Get help right away if:  You think that you or someone  else may have taken too much of an opioid. The hotline of the [facility] is 410-416-6735.  You or someone else is having symptoms of an opioid overdose.  You have serious thoughts about hurting yourself or others.  You have:  Chest pain.  Difficulty breathing.  A loss of consciousness. Opioid overdose is an emergency. Do not wait to see if the symptoms will go away. Get medical help right away. Call your local emergency services (911 in the [place] Do not drive yourself to the hospital. This information is not intended to replace advice given to you by your health care provider. Make sure you discuss any questions you have with your health care provider. Document Released: 12/30/2004 Document Revised: 04/29/2016 Document Reviewed: 05/08/2015 Elsevier Interactive Patient Education  2017 [employer].

## 2017-02-17 ENCOUNTER — Telehealth: Payer: Self-pay | Admitting: Family Medicine

## 2017-02-17 NOTE — Telephone Encounter (Signed)
Pt would like for Dr Bridget Hartshorn to give her a call back wouldn't give me any information on why she just said medical concern I advised her Dr Bridget Hartshorn would not be here until Satrurday

## 2017-02-20 DIAGNOSIS — M1990 Unspecified osteoarthritis, unspecified site: Secondary | ICD-10-CM | POA: Diagnosis not present

## 2017-02-20 DIAGNOSIS — I10 Essential (primary) hypertension: Secondary | ICD-10-CM | POA: Diagnosis not present

## 2017-02-20 DIAGNOSIS — E119 Type 2 diabetes mellitus without complications: Secondary | ICD-10-CM | POA: Diagnosis not present

## 2017-02-20 DIAGNOSIS — Z435 Encounter for attention to cystostomy: Secondary | ICD-10-CM | POA: Diagnosis not present

## 2017-02-20 DIAGNOSIS — L89893 Pressure ulcer of other site, stage 3: Secondary | ICD-10-CM | POA: Diagnosis not present

## 2017-02-20 DIAGNOSIS — G35 Multiple sclerosis: Secondary | ICD-10-CM | POA: Diagnosis not present

## 2017-02-20 LAB — URINE CULTURE

## 2017-02-21 ENCOUNTER — Other Ambulatory Visit: Payer: Self-pay | Admitting: Family Medicine

## 2017-02-21 MED ORDER — SULFAMETHOXAZOLE-TRIMETHOPRIM 800-160 MG PO TABS: 1.0000 | ORAL_TABLET | Freq: Two times a day (BID) | ORAL | 0 refills | Status: AC

## 2017-02-21 NOTE — Telephone Encounter (Signed)
Pt with another UTI Will treat with Bactrim DS for 5 days No record of who her urologist is advised pt to take her labs to Urology to discuss if she should be on uti prophylaxis  Pt asked about a vocational form Not seen so far Will look in box

## 2017-02-23 ENCOUNTER — Telehealth: Payer: Self-pay | Admitting: Family Medicine

## 2017-02-23 ENCOUNTER — Other Ambulatory Visit: Payer: Self-pay | Admitting: Family Medicine

## 2017-02-23 DIAGNOSIS — I10 Essential (primary) hypertension: Secondary | ICD-10-CM | POA: Diagnosis not present

## 2017-02-23 DIAGNOSIS — Z435 Encounter for attention to cystostomy: Secondary | ICD-10-CM | POA: Diagnosis not present

## 2017-02-23 DIAGNOSIS — G35 Multiple sclerosis: Secondary | ICD-10-CM | POA: Diagnosis not present

## 2017-02-23 DIAGNOSIS — M25562 Pain in left knee: Secondary | ICD-10-CM | POA: Diagnosis not present

## 2017-02-23 DIAGNOSIS — L89893 Pressure ulcer of other site, stage 3: Secondary | ICD-10-CM | POA: Diagnosis not present

## 2017-02-23 DIAGNOSIS — M79605 Pain in left leg: Secondary | ICD-10-CM | POA: Diagnosis not present

## 2017-02-23 DIAGNOSIS — M79671 Pain in right foot: Secondary | ICD-10-CM | POA: Diagnosis not present

## 2017-02-23 DIAGNOSIS — M79672 Pain in left foot: Secondary | ICD-10-CM | POA: Diagnosis not present

## 2017-02-23 DIAGNOSIS — M79604 Pain in right leg: Secondary | ICD-10-CM | POA: Diagnosis not present

## 2017-02-23 DIAGNOSIS — E119 Type 2 diabetes mellitus without complications: Secondary | ICD-10-CM | POA: Diagnosis not present

## 2017-02-23 DIAGNOSIS — M1990 Unspecified osteoarthritis, unspecified site: Secondary | ICD-10-CM | POA: Diagnosis not present

## 2017-02-23 DIAGNOSIS — M25561 Pain in right knee: Secondary | ICD-10-CM | POA: Diagnosis not present

## 2017-02-23 DIAGNOSIS — Z79891 Long term (current) use of opiate analgesic: Secondary | ICD-10-CM | POA: Diagnosis not present

## 2017-02-23 DIAGNOSIS — G894 Chronic pain syndrome: Secondary | ICD-10-CM | POA: Diagnosis not present

## 2017-02-23 LAB — DRUG SCREEN, URINE

## 2017-02-23 NOTE — Telephone Encounter (Signed)
Kenn File from [place] [employer] [facility] states that they have faxed a RX over for Dr Bridget Hartshorn to sign off on for pt to get her supplies please call [facility] at [phone]

## 2017-02-23 NOTE — Telephone Encounter (Signed)
Pt didn't answer phone call I was calling pt to let her know that the appointment that she had with Dr Bridget Hartshorn has been cancelled and to reschedule

## 2017-02-23 NOTE — Telephone Encounter (Signed)
Please let the patient know that I saw a form from social work updating her meds and asking for a complete list of diagnosis.  This was faxed today. I also signed and faxed form for physical therapy.  Did not find a form for vocational services.

## 2017-02-24 ENCOUNTER — Telehealth: Payer: Self-pay | Admitting: Family Medicine

## 2017-02-24 NOTE — Telephone Encounter (Signed)
Pt states that all she is calling for is to leave a different number where she can be reached at [phone]

## 2017-02-25 DIAGNOSIS — E1140 Type 2 diabetes mellitus with diabetic neuropathy, unspecified: Secondary | ICD-10-CM | POA: Diagnosis not present

## 2017-02-25 DIAGNOSIS — I10 Essential (primary) hypertension: Secondary | ICD-10-CM | POA: Diagnosis not present

## 2017-02-25 DIAGNOSIS — L89313 Pressure ulcer of right buttock, stage 3: Secondary | ICD-10-CM | POA: Diagnosis not present

## 2017-02-25 DIAGNOSIS — G35 Multiple sclerosis: Secondary | ICD-10-CM | POA: Diagnosis not present

## 2017-02-25 NOTE — Telephone Encounter (Signed)
l/m with md note

## 2017-02-25 NOTE — Telephone Encounter (Signed)
Pt states that all she is calling for is to leave a different number where she can be reached at [phone]

## 2017-02-27 DIAGNOSIS — L89893 Pressure ulcer of other site, stage 3: Secondary | ICD-10-CM | POA: Diagnosis not present

## 2017-02-27 DIAGNOSIS — M1990 Unspecified osteoarthritis, unspecified site: Secondary | ICD-10-CM | POA: Diagnosis not present

## 2017-02-27 DIAGNOSIS — E119 Type 2 diabetes mellitus without complications: Secondary | ICD-10-CM | POA: Diagnosis not present

## 2017-02-27 DIAGNOSIS — I10 Essential (primary) hypertension: Secondary | ICD-10-CM | POA: Diagnosis not present

## 2017-02-27 DIAGNOSIS — G35 Multiple sclerosis: Secondary | ICD-10-CM | POA: Diagnosis not present

## 2017-02-27 DIAGNOSIS — Z435 Encounter for attention to cystostomy: Secondary | ICD-10-CM | POA: Diagnosis not present

## 2017-03-01 ENCOUNTER — Telehealth: Payer: Self-pay | Admitting: Family Medicine

## 2017-03-01 NOTE — Telephone Encounter (Signed)
Pt is calling to let [place] know that the medicine that she put her on for a UTI is making her sugar low please respond

## 2017-03-02 DIAGNOSIS — I10 Essential (primary) hypertension: Secondary | ICD-10-CM | POA: Diagnosis not present

## 2017-03-02 DIAGNOSIS — M1990 Unspecified osteoarthritis, unspecified site: Secondary | ICD-10-CM | POA: Diagnosis not present

## 2017-03-02 DIAGNOSIS — G35 Multiple sclerosis: Secondary | ICD-10-CM | POA: Diagnosis not present

## 2017-03-02 DIAGNOSIS — E119 Type 2 diabetes mellitus without complications: Secondary | ICD-10-CM | POA: Diagnosis not present

## 2017-03-02 DIAGNOSIS — L89893 Pressure ulcer of other site, stage 3: Secondary | ICD-10-CM | POA: Diagnosis not present

## 2017-03-02 DIAGNOSIS — Z435 Encounter for attention to cystostomy: Secondary | ICD-10-CM | POA: Diagnosis not present

## 2017-03-03 ENCOUNTER — Telehealth: Payer: Self-pay | Admitting: Family Medicine

## 2017-03-03 NOTE — Telephone Encounter (Addendum)
PATIENT STATES HER SOCIAL WORKER FROM [place] (KELLY GILMORE) HAS FAXED OVER A PRESCRIPTION FOR A MEDICAL SUPPLY THAT SHE NEEDS TWICE TO DR. Nolon Rod AND SHE HAS NOT GOTTEN A RESPONSE BACK YET. SHE SAID IT JUST NEEDS TO BE SIGNED AND FAXED BACK. BEST PHONE [phone] (PATIENT'S CELL) [place]

## 2017-03-03 NOTE — Telephone Encounter (Signed)
Pt has stopped taking uti meds due to sugars being in the 80s Felt shaky and nervous and bad.  Wants to go on cipro if able.because still having uti sxs

## 2017-03-04 ENCOUNTER — Ambulatory Visit (INDEPENDENT_AMBULATORY_CARE_PROVIDER_SITE_OTHER): Payer: Medicare Other | Admitting: Family Medicine

## 2017-03-04 VITALS — BP 155/93 | HR 85 | Temp 98.0°F | Resp 17

## 2017-03-04 DIAGNOSIS — Z9289 Personal history of other medical treatment: Secondary | ICD-10-CM | POA: Diagnosis not present

## 2017-03-04 DIAGNOSIS — Z978 Presence of other specified devices: Secondary | ICD-10-CM

## 2017-03-04 DIAGNOSIS — E162 Hypoglycemia, unspecified: Secondary | ICD-10-CM | POA: Diagnosis not present

## 2017-03-04 DIAGNOSIS — N390 Urinary tract infection, site not specified: Secondary | ICD-10-CM

## 2017-03-04 DIAGNOSIS — L89893 Pressure ulcer of other site, stage 3: Secondary | ICD-10-CM | POA: Diagnosis not present

## 2017-03-04 DIAGNOSIS — I10 Essential (primary) hypertension: Secondary | ICD-10-CM | POA: Diagnosis not present

## 2017-03-04 DIAGNOSIS — M1990 Unspecified osteoarthritis, unspecified site: Secondary | ICD-10-CM | POA: Diagnosis not present

## 2017-03-04 DIAGNOSIS — Z435 Encounter for attention to cystostomy: Secondary | ICD-10-CM | POA: Diagnosis not present

## 2017-03-04 DIAGNOSIS — E119 Type 2 diabetes mellitus without complications: Secondary | ICD-10-CM | POA: Diagnosis not present

## 2017-03-04 DIAGNOSIS — Z960 Presence of urogenital implants: Secondary | ICD-10-CM

## 2017-03-04 DIAGNOSIS — G35 Multiple sclerosis: Secondary | ICD-10-CM | POA: Diagnosis not present

## 2017-03-04 LAB — POCT CBC
Granulocyte percent: 53.8 %G (ref 37–80)
HCT, POC: 31.4 % — AB (ref 37.7–47.9)
Hemoglobin: 10.2 g/dL — AB (ref 12.2–16.2)
LYMPH, POC: 3.5 — AB (ref 0.6–3.4)
MCH, POC: 25.8 pg — AB (ref 27–31.2)
MCHC: 32.6 g/dL (ref 31.8–35.4)
MCV: 79.1 fL — AB (ref 80–97)
MID (cbc): 0.3 (ref 0–0.9)
MPV: 7.2 fL (ref 0–99.8)
POC GRANULOCYTE: 4.5 (ref 2–6.9)
POC LYMPH %: 42 % (ref 10–50)
POC MID %: 4.2 %M (ref 0–12)
Platelet Count, POC: 440 10*3/uL — AB (ref 142–424)
RBC: 3.97 M/uL — AB (ref 4.04–5.48)
RDW, POC: 17.2 %
WBC: 8.3 10*3/uL (ref 4.6–10.2)

## 2017-03-04 LAB — POCT URINALYSIS DIP (MANUAL ENTRY)
BILIRUBIN UA: NEGATIVE
Glucose, UA: NEGATIVE
Ketones, POC UA: NEGATIVE
NITRITE UA: NEGATIVE
PROTEIN UA: NEGATIVE
Spec Grav, UA: 1.02 (ref 1.030–1.035)
UROBILINOGEN UA: 0.2 (ref ?–2.0)
pH, UA: 5 (ref 5.0–8.0)

## 2017-03-04 LAB — POC MICROSCOPIC URINALYSIS (UMFC)
Mucus: ABSENT
Yeast: POSITIVE

## 2017-03-04 LAB — GLUCOSE, POCT (MANUAL RESULT ENTRY): POC Glucose: 118 mg/dl — AB (ref 70–99)

## 2017-03-04 NOTE — Progress Notes (Signed)
Chief Complaint  Patient presents with  . Follow-up    Was advised to follow up for blood work    HPI   Pt reports that she stopped the Keflex because of having episode of hypoglycemia when taking Keflex Saturday and Sunday 5 days ago. She states that she thought that her sugars would continue to go lower and wanted to know if she could try a different antibiotic She reports that besides the hypoglycemia No fevers or chills of chills  She states that she still has a burning sensation in her bladder Her next appointment with Urology is September 2018 She states that she was to get a bladder scope at that appointment   Past Medical History:  Diagnosis Date  . Arthritis   . Decubitus ulcers   . Diabetes mellitus without complication (HCC)   . Hypertension   . MS (multiple sclerosis) (HCC)     Current Outpatient Prescriptions  Medication Sig Dispense Refill  . Adhesive Tape (PAPER TAPE 1"X10YD) TAPE Apply to dressings as needed 4 each 6  . atorvastatin (LIPITOR) 20 MG tablet Take 20 mg by mouth daily.    . baclofen (LIORESAL) 20 MG tablet Take 1 tablet (20 mg total) by mouth 4 (four) times daily. 120 each 2  . Catheters (DOVER UNIVERSAL FOLEY TRAY) KIT Use foley tray kit to change catheter every 3 weeks or as needed 15 each 1  . Catheters (FOLEY CATHETER 2-WAY) MISC Use to aspirate urine suprapubic 15 each 1  . Cholecalciferol (D3-1000) 1000 units capsule Take 1,000 Units by mouth daily.    . Control Gel Formula Dressing (DUODERM CGF DRESSING) MISC Apply 1 each topically daily as needed. 5 each 11  . cyanocobalamin 500 MCG tablet Take 500 mcg by mouth daily.    . Disposable Gloves MISC Use disposable gloves to change catheter as needed 200 each 11  . Exenatide ER 2 MG/0.85ML AUIJ Inject 2 mg into the skin.    . FORTAMET 1000 MG 24 hr tablet Take 1,000 mg by mouth 2 (two) times daily.  0  . furosemide (LASIX) 20 MG tablet Take 20 mg by mouth daily.    . glipiZIDE (GLUCOTROL) 5 MG  tablet Take 5 mg by mouth daily before lunch.    . Hydroactive Dressings (DUODERM HYDROACTIVE) PSTE Apply to pressure wounds as needed 60 g 11  . hydrochlorothiazide (HYDRODIURIL) 25 MG tablet Take 25 mg by mouth daily.    . Incontinence Supply Disposable (TENA FLEX 16 PLUS) MISC Use daily as needed 30 each 11  . Incontinence Supply Disposable (TENA PROTECT UNDERWEAR PLS/XLG) MISC Use for incontinence. 30 each 11  . Interferon Beta-1b (BETASERON) 0.3 MG KIT injection Inject 0.3 mg into the skin every other day.     . lamoTRIgine (LAMICTAL) 100 MG tablet Take 1/2 pill po x 5 days, then 1/2 pill po bid x 5 days, then 1/2 pill po tid x 5 days then 1 pill po bid 60 tablet 11  . metFORMIN (GLUCOPHAGE) 1000 MG tablet Take 1,000 mg by mouth 2 (two) times daily with a meal.    . MOVANTIK 25 MG TABS tablet Take 25 mg by mouth every morning.  4  . NONFORMULARY OR COMPOUNDED ITEM [facility]:  Peripheral Neuropathy Cream - Bupivacaine 1%, Doxepin 3%, Gabapentin 6%, Pentoxifylline 3%, Topiramate 1%, apply 1-2 grams to affected area 3-4 times daily. 120 each 2  . nystatin ointment (MYCOSTATIN) apply to affected area(s) by topical route TWICE DAILY  2  .   Ostomy Supplies (NEW IMAGE SKIN/FLANGE/TAPE) MISC 1 application by Does not apply route daily as needed. 30 each 11  . oxyCODONE (OXYCONTIN) 20 mg 12 hr tablet Take 1 tablet (20 mg total) by mouth 3 (three) times daily as needed (pain). 90 tablet 0  . oxyCODONE-acetaminophen (PERCOCET) 10-325 MG tablet Take 1 tablet by mouth every 8 (eight) hours as needed for pain. 90 tablet 0  . potassium chloride (K-DUR,KLOR-CON) 10 MEQ tablet Take by mouth.    . Syringe, Disposable, (B-D SYRINGE LUER-LOK 30CC) 30 ML MISC Use to change catheter 15 each 0  . Wound Dressings (GRX HYDROGEL GAUZE 4X4) PADS Use gauze to clean wound daily as needed 200 each 11  . cephALEXin (KEFLEX) 500 MG capsule Take 1 capsule (500 mg total) by mouth 3 (three) times daily. (Patient not  taking: Reported on 03/04/2017) 30 capsule 0   No current facility-administered medications for this visit.     Allergies: No Known Allergies  Past Surgical History:  Procedure Laterality Date  . SUPRAPUBIC CATHETER INSERTION  2009    Social History   Social History  . Marital status: Single    Spouse name: N/A  . Number of children: N/A  . Years of education: N/A   Social History Main Topics  . Smoking status: Never Smoker  . Smokeless tobacco: Never Used  . Alcohol use No  . Drug use: No  . Sexual activity: Not Asked   Other Topics Concern  . None   Social History Narrative  . None    ROS See hpi  Objective: Vitals:   03/04/17 1743  BP: (!) 155/93  Pulse: 85  Resp: 17  Temp: 98 F (36.7 C)  TempSrc: Oral  SpO2: 100%    Physical Exam  Constitutional: She is oriented to person, place, and time. She appears well-developed and well-nourished.  Obese patient in wheelchair  HENT:  Head: Normocephalic and atraumatic.  Eyes: Conjunctivae and EOM are normal.  Cardiovascular: Normal rate, regular rhythm and normal heart sounds.   Pulmonary/Chest: Effort normal and breath sounds normal. No respiratory distress. She has no wheezes.  Neurological: She is alert and oriented to person, place, and time.  Psychiatric: She has a normal mood and affect. Her behavior is normal. Judgment and thought content normal.    POC glucose 118 Lab Results  Component Value Date   WBC 8.3 03/04/2017   HGB 10.2 (A) 03/04/2017   HCT 31.4 (A) 03/04/2017   MCV 79.1 (A) 03/04/2017   PLT 436 (H) 12/15/2016   UA with small blood and small LE  Assessment and Plan Miria was seen today for follow-up.  Diagnoses and all orders for this visit:  Hypoglycemia- discussed that she should hold her glipizide if her blood glucose gets below 90  She should hold glipizide until after completing abx She should also monitor her symptoms when she gets hypoglycemic and check a blood pressure  as well since she has a CNA that comes to the house -     POCT urinalysis dipstick -     POCT CBC -     POCT Microscopic Urinalysis ([facility]) -     POCT glucose (manual entry)  Recurrent UTI- will recheck labs No evidence of urosepsis Hypoglycemia resolved Reviewed urine culture and advised pt to resume the last 3 days of keflex Discussed the resistance of her bacteria to cipro -     POCT urinalysis dipstick -     POCT CBC -  POCT Microscopic Urinalysis ([facility]) -     POCT glucose (manual entry) -     Urine culture  Chronic indwelling Foley catheter- advised to follow up with Urology to discuss if there is a role for prophylactic abx     Analisia Kingsford A Nolon Rod

## 2017-03-04 NOTE — Telephone Encounter (Signed)
In your box I believe

## 2017-03-04 NOTE — Telephone Encounter (Signed)
Let her know to stop her diabetes medications She should stop the keflex but the last culture the bacteria was resistant to Cipro Discussed that she is having the patient come in today as she has MS and hypoglycemia might be an early sign of sepsis. She will come in today.

## 2017-03-04 NOTE — Patient Instructions (Addendum)
Continue Keflex but stop the glipizide while taking keflex Restart the glipizide once your are finished with the antibiotic    IF you received an x-ray today, you will receive an invoice from [facility]. Please contact [facility] at [phone] with questions or concerns regarding your invoice.   IF you received labwork today, you will receive an invoice from [facility]. Please contact [facility] at [phone] with questions or concerns regarding your invoice.   Our billing staff will not be able to assist you with questions regarding bills from these companies.  You will be contacted with the lab results as soon as they are available. The fastest way to get your results is to activate your My Chart account. Instructions are located on the last page of this paperwork. If you have not heard from [place] regarding the results in 2 weeks, please contact this office.

## 2017-03-06 DIAGNOSIS — M1990 Unspecified osteoarthritis, unspecified site: Secondary | ICD-10-CM | POA: Diagnosis not present

## 2017-03-06 DIAGNOSIS — Z435 Encounter for attention to cystostomy: Secondary | ICD-10-CM | POA: Diagnosis not present

## 2017-03-06 DIAGNOSIS — G35 Multiple sclerosis: Secondary | ICD-10-CM | POA: Diagnosis not present

## 2017-03-06 DIAGNOSIS — L89893 Pressure ulcer of other site, stage 3: Secondary | ICD-10-CM | POA: Diagnosis not present

## 2017-03-06 DIAGNOSIS — I10 Essential (primary) hypertension: Secondary | ICD-10-CM | POA: Diagnosis not present

## 2017-03-06 DIAGNOSIS — E119 Type 2 diabetes mellitus without complications: Secondary | ICD-10-CM | POA: Diagnosis not present

## 2017-03-09 ENCOUNTER — Ambulatory Visit: Payer: Medicare Other | Admitting: Family Medicine

## 2017-03-09 DIAGNOSIS — G35 Multiple sclerosis: Secondary | ICD-10-CM | POA: Diagnosis not present

## 2017-03-09 DIAGNOSIS — M1990 Unspecified osteoarthritis, unspecified site: Secondary | ICD-10-CM | POA: Diagnosis not present

## 2017-03-09 DIAGNOSIS — E119 Type 2 diabetes mellitus without complications: Secondary | ICD-10-CM | POA: Diagnosis not present

## 2017-03-09 DIAGNOSIS — L89893 Pressure ulcer of other site, stage 3: Secondary | ICD-10-CM | POA: Diagnosis not present

## 2017-03-09 DIAGNOSIS — Z435 Encounter for attention to cystostomy: Secondary | ICD-10-CM | POA: Diagnosis not present

## 2017-03-09 DIAGNOSIS — I10 Essential (primary) hypertension: Secondary | ICD-10-CM | POA: Diagnosis not present

## 2017-03-11 ENCOUNTER — Encounter (HOSPITAL_BASED_OUTPATIENT_CLINIC_OR_DEPARTMENT_OTHER): Payer: Medicare Other | Attending: Internal Medicine

## 2017-03-11 DIAGNOSIS — G35 Multiple sclerosis: Secondary | ICD-10-CM | POA: Insufficient documentation

## 2017-03-11 DIAGNOSIS — L89312 Pressure ulcer of right buttock, stage 2: Secondary | ICD-10-CM | POA: Diagnosis not present

## 2017-03-11 DIAGNOSIS — E1140 Type 2 diabetes mellitus with diabetic neuropathy, unspecified: Secondary | ICD-10-CM | POA: Insufficient documentation

## 2017-03-11 DIAGNOSIS — I10 Essential (primary) hypertension: Secondary | ICD-10-CM | POA: Insufficient documentation

## 2017-03-11 DIAGNOSIS — L89313 Pressure ulcer of right buttock, stage 3: Secondary | ICD-10-CM | POA: Diagnosis not present

## 2017-03-12 LAB — URINE CULTURE

## 2017-03-13 DIAGNOSIS — I10 Essential (primary) hypertension: Secondary | ICD-10-CM | POA: Diagnosis not present

## 2017-03-13 DIAGNOSIS — E119 Type 2 diabetes mellitus without complications: Secondary | ICD-10-CM | POA: Diagnosis not present

## 2017-03-13 DIAGNOSIS — G35 Multiple sclerosis: Secondary | ICD-10-CM | POA: Diagnosis not present

## 2017-03-13 DIAGNOSIS — L89893 Pressure ulcer of other site, stage 3: Secondary | ICD-10-CM | POA: Diagnosis not present

## 2017-03-13 DIAGNOSIS — M1990 Unspecified osteoarthritis, unspecified site: Secondary | ICD-10-CM | POA: Diagnosis not present

## 2017-03-13 DIAGNOSIS — Z435 Encounter for attention to cystostomy: Secondary | ICD-10-CM | POA: Diagnosis not present

## 2017-03-16 DIAGNOSIS — G35 Multiple sclerosis: Secondary | ICD-10-CM | POA: Diagnosis not present

## 2017-03-16 DIAGNOSIS — Z435 Encounter for attention to cystostomy: Secondary | ICD-10-CM | POA: Diagnosis not present

## 2017-03-16 DIAGNOSIS — M1990 Unspecified osteoarthritis, unspecified site: Secondary | ICD-10-CM | POA: Diagnosis not present

## 2017-03-16 DIAGNOSIS — E119 Type 2 diabetes mellitus without complications: Secondary | ICD-10-CM | POA: Diagnosis not present

## 2017-03-16 DIAGNOSIS — L89893 Pressure ulcer of other site, stage 3: Secondary | ICD-10-CM | POA: Diagnosis not present

## 2017-03-16 DIAGNOSIS — I10 Essential (primary) hypertension: Secondary | ICD-10-CM | POA: Diagnosis not present

## 2017-03-18 DIAGNOSIS — Z435 Encounter for attention to cystostomy: Secondary | ICD-10-CM | POA: Diagnosis not present

## 2017-03-18 DIAGNOSIS — G35 Multiple sclerosis: Secondary | ICD-10-CM | POA: Diagnosis not present

## 2017-03-18 DIAGNOSIS — L89893 Pressure ulcer of other site, stage 3: Secondary | ICD-10-CM | POA: Diagnosis not present

## 2017-03-18 DIAGNOSIS — E119 Type 2 diabetes mellitus without complications: Secondary | ICD-10-CM | POA: Diagnosis not present

## 2017-03-18 DIAGNOSIS — I10 Essential (primary) hypertension: Secondary | ICD-10-CM | POA: Diagnosis not present

## 2017-03-18 DIAGNOSIS — M1990 Unspecified osteoarthritis, unspecified site: Secondary | ICD-10-CM | POA: Diagnosis not present

## 2017-03-20 DIAGNOSIS — Z435 Encounter for attention to cystostomy: Secondary | ICD-10-CM | POA: Diagnosis not present

## 2017-03-20 DIAGNOSIS — E119 Type 2 diabetes mellitus without complications: Secondary | ICD-10-CM | POA: Diagnosis not present

## 2017-03-20 DIAGNOSIS — G35 Multiple sclerosis: Secondary | ICD-10-CM | POA: Diagnosis not present

## 2017-03-20 DIAGNOSIS — M1990 Unspecified osteoarthritis, unspecified site: Secondary | ICD-10-CM | POA: Diagnosis not present

## 2017-03-20 DIAGNOSIS — I10 Essential (primary) hypertension: Secondary | ICD-10-CM | POA: Diagnosis not present

## 2017-03-20 DIAGNOSIS — L89893 Pressure ulcer of other site, stage 3: Secondary | ICD-10-CM | POA: Diagnosis not present

## 2017-03-22 NOTE — Progress Notes (Signed)
Chief Complaint  Patient presents with  . catheter change    patient is here for routine catheter change    HPI  Diabetes Pt reports that she no longer has hypoglycemia. She is now on back on glipizide which she held while on antibiotics Her fasting glucose are 100-130  Decubitus ulcers She goes to wound clinic every 2 weeks and gets home health as well She states that she is healing nicely and has not pain  Recurrent UTI/Indwelling catheter Patient reports that her urine is very cloudy and occasionally has blood in the urine She reports feeling pressure in her bladder and bilateral flank pain She reports that she called urology but has not received a call back   Past Medical History:  Diagnosis Date  . Arthritis   . Decubitus ulcers   . Diabetes mellitus without complication ([place])   . Hypertension   . MS (multiple sclerosis) ([place])     Current Outpatient Prescriptions  Medication Sig Dispense Refill  . Adhesive Tape (PAPER TAPE 1"X10YD) TAPE Apply to dressings as needed 4 each 6  . atorvastatin (LIPITOR) 20 MG tablet Take 20 mg by mouth daily.    . baclofen (LIORESAL) 20 MG tablet Take 1 tablet (20 mg total) by mouth 4 (four) times daily. 120 each 2  . Catheters (DOVER UNIVERSAL FOLEY TRAY) KIT Use foley tray kit to change catheter every 3 weeks or as needed 15 each 1  . Catheters (FOLEY CATHETER 2-WAY) MISC Use to aspirate urine suprapubic 15 each 1  . Cholecalciferol (D3-1000) 1000 units capsule Take 1,000 Units by mouth daily.    . Control Gel Formula Dressing (DUODERM CGF DRESSING) MISC Apply 1 each topically daily as needed. 5 each 11  . cyanocobalamin 500 MCG tablet Take 500 mcg by mouth daily.    . Disposable Gloves MISC Use disposable gloves to change catheter as needed 200 each 11  . Exenatide ER 2 MG/0.85ML AUIJ Inject 2 mg into the skin.    . FORTAMET 1000 MG 24 hr tablet Take 1,000 mg by mouth 2 (two) times daily.  0  . furosemide (LASIX) 20 MG tablet Take  20 mg by mouth daily.    Marland Kitchen glipiZIDE (GLUCOTROL) 5 MG tablet Take 5 mg by mouth daily before lunch.    . hydrochlorothiazide (HYDRODIURIL) 25 MG tablet Take 25 mg by mouth daily.    . Incontinence Supply Disposable (TENA FLEX 16 PLUS) MISC Use daily as needed 30 each 11  . Incontinence Supply Disposable (TENA PROTECT UNDERWEAR PLS/XLG) MISC Use for incontinence. 30 each 11  . Interferon Beta-1b (BETASERON) 0.3 MG KIT injection Inject 0.3 mg into the skin every other day.     . metFORMIN (GLUCOPHAGE) 1000 MG tablet Take 1,000 mg by mouth 2 (two) times daily with a meal.    . MOVANTIK 25 MG TABS tablet Take 25 mg by mouth every morning.  4  . NONFORMULARY OR COMPOUNDED ITEM [facility]:  Peripheral Neuropathy Cream - Bupivacaine 1%, Doxepin 3%, Gabapentin 6%, Pentoxifylline 3%, Topiramate 1%, apply 1-2 grams to affected area 3-4 times daily. 120 each 2  . nystatin ointment (MYCOSTATIN) apply to affected area(s) by topical route TWICE DAILY  2  . Ostomy Supplies (NEW IMAGE SKIN/FLANGE/TAPE) MISC 1 application by Does not apply route daily as needed. 30 each 11  . oxyCODONE (OXYCONTIN) 20 mg 12 hr tablet Take 1 tablet (20 mg total) by mouth 3 (three) times daily as needed (pain). 90 tablet 0  .  oxyCODONE-acetaminophen (PERCOCET) 10-325 MG tablet Take 1 tablet by mouth every 8 (eight) hours as needed for pain. 90 tablet 0  . potassium chloride (K-DUR,KLOR-CON) 10 MEQ tablet Take by mouth.    . Syringe, Disposable, (B-D SYRINGE LUER-LOK 30CC) 30 ML MISC Use to change catheter 15 each 0  . Wound Dressings (GRX HYDROGEL GAUZE 4X4) PADS Use gauze to clean wound daily as needed 200 each 11  . ciprofloxacin (CIPRO) 500 MG tablet Take 1 tablet (500 mg total) by mouth 2 (two) times daily. Stop glipizide while taking Cipro 14 tablet 0  . Hydroactive Dressings (DUODERM HYDROACTIVE) PSTE Apply to pressure wounds as needed (Patient not taking: Reported on 03/23/2017) 60 g 11  . lamoTRIgine (LAMICTAL) 100 MG  tablet Take 1/2 pill po x 5 days, then 1/2 pill po bid x 5 days, then 1/2 pill po tid x 5 days then 1 pill po bid (Patient not taking: Reported on 03/23/2017) 60 tablet 11   No current facility-administered medications for this visit.     Allergies: No Known Allergies  Past Surgical History:  Procedure Laterality Date  . SUPRAPUBIC CATHETER INSERTION  2009    Social History   Social History  . Marital status: Single    Spouse name: N/A  . Number of children: N/A  . Years of education: N/A   Social History Main Topics  . Smoking status: Never Smoker  . Smokeless tobacco: Never Used  . Alcohol use No  . Drug use: No  . Sexual activity: Not Asked   Other Topics Concern  . None   Social History Narrative  . None    Review of Systems  Respiratory: Negative for cough, shortness of breath and wheezing.   Cardiovascular: Negative for chest pain and palpitations.  Gastrointestinal: Negative for abdominal pain, nausea and vomiting.  Genitourinary: Positive for flank pain.  Skin: Negative for itching and rash.    Objective: Vitals:   03/23/17 1518  BP: 135/80  Pulse: 91  Resp: 18  Temp: 98.1 F (36.7 C)  TempSrc: Oral  SpO2: 98%    Physical Exam  Constitutional: She is oriented to person, place, and time. She appears well-developed and well-nourished.  HENT:  Head: Normocephalic and atraumatic.  Cardiovascular: Normal rate, regular rhythm and normal heart sounds.   No murmur heard. Pulmonary/Chest: Effort normal and breath sounds normal. No respiratory distress. She has no wheezes.  Abdominal:  Suprapubic catheter changed Foley catheter replaced Cloudy dark urine   Neurological: She is alert and oriented to person, place, and time.  Skin: Skin is warm. Capillary refill takes less than 2 seconds.  Psychiatric: She has a normal mood and affect. Her behavior is normal. Judgment and thought content normal.    Urine cultures 03/04/17- Klebsiella P Susceptible to  Cipro 02/16/17- Acinetobacter Baumannii Cipro resistant  Bactrim Susceptible 01/26/2017 Klebsiella P Susceptible Cipro   Assessment and Plan [name] was seen today for catheter change.  Diagnoses and all orders for this visit:  Recurrent UTI -     Urine culture  Indwelling Foley catheter present- foley catheter changed today Attempted to get a fresh catch up there was very little urine return and pt could not wait to leave sample -     Urine culture  Controlled type 2 diabetes mellitus with diabetic neuropathy, without long-term current use of insulin ([place])- diabetes well controlled To treat UTI will use Cipro since the common culprit of the UTI is typically Klebsiella P which is susceptible to  Cipro Advise pt to stop glipizide while taking Cipro as it increases the risk of hypoglycemia  Decubitus ulcer of sacral region, stage 3 ([place])- improving with intervention from [facility]  Other orders -     ciprofloxacin (CIPRO) 500 MG tablet; Take 1 tablet (500 mg total) by mouth 2 (two) times daily. Stop glipizide while taking Cipro    03/30/17 for appt with NP Brett Canales

## 2017-03-23 ENCOUNTER — Ambulatory Visit (INDEPENDENT_AMBULATORY_CARE_PROVIDER_SITE_OTHER): Payer: Medicare Other | Admitting: Family Medicine

## 2017-03-23 ENCOUNTER — Encounter: Payer: Self-pay | Admitting: Family Medicine

## 2017-03-23 ENCOUNTER — Telehealth: Payer: Self-pay | Admitting: *Deleted

## 2017-03-23 VITALS — BP 135/80 | HR 91 | Temp 98.1°F | Resp 18

## 2017-03-23 DIAGNOSIS — E1140 Type 2 diabetes mellitus with diabetic neuropathy, unspecified: Secondary | ICD-10-CM | POA: Diagnosis not present

## 2017-03-23 DIAGNOSIS — Z960 Presence of urogenital implants: Secondary | ICD-10-CM

## 2017-03-23 DIAGNOSIS — Z978 Presence of other specified devices: Secondary | ICD-10-CM

## 2017-03-23 DIAGNOSIS — N390 Urinary tract infection, site not specified: Secondary | ICD-10-CM

## 2017-03-23 DIAGNOSIS — L89153 Pressure ulcer of sacral region, stage 3: Secondary | ICD-10-CM | POA: Diagnosis not present

## 2017-03-23 MED ORDER — CIPROFLOXACIN HCL 500 MG PO TABS: 500.0000 mg | ORAL_TABLET | Freq: Two times a day (BID) | ORAL | 0 refills | Status: AC

## 2017-03-23 NOTE — Patient Instructions (Signed)
     IF you received an x-ray today, you will receive an invoice from [facility]. Please contact [facility] at [phone] with questions or concerns regarding your invoice.   IF you received labwork today, you will receive an invoice from [facility]. Please contact [facility] at [phone] with questions or concerns regarding your invoice.   Our billing staff will not be able to assist you with questions regarding bills from these companies.  You will be contacted with the lab results as soon as they are available. The fastest way to get your results is to activate your My Chart account. Instructions are located on the last page of this paperwork. If you have not heard from us regarding the results in 2 weeks, please contact this office.     

## 2017-03-23 NOTE — Telephone Encounter (Signed)
Faxed letter to Dr Junious Silk, patient has an appointment on 03/30/2017 with NP Krysia Gess. Per Dr Nolon Rod; confirmation page received at 6:20 PM.

## 2017-03-24 DIAGNOSIS — N390 Urinary tract infection, site not specified: Secondary | ICD-10-CM | POA: Diagnosis not present

## 2017-03-24 DIAGNOSIS — Z960 Presence of urogenital implants: Secondary | ICD-10-CM | POA: Diagnosis not present

## 2017-03-25 DIAGNOSIS — E119 Type 2 diabetes mellitus without complications: Secondary | ICD-10-CM | POA: Diagnosis not present

## 2017-03-25 DIAGNOSIS — Z435 Encounter for attention to cystostomy: Secondary | ICD-10-CM | POA: Diagnosis not present

## 2017-03-25 DIAGNOSIS — I10 Essential (primary) hypertension: Secondary | ICD-10-CM | POA: Diagnosis not present

## 2017-03-25 DIAGNOSIS — G35 Multiple sclerosis: Secondary | ICD-10-CM | POA: Diagnosis not present

## 2017-03-25 DIAGNOSIS — L89893 Pressure ulcer of other site, stage 3: Secondary | ICD-10-CM | POA: Diagnosis not present

## 2017-03-25 DIAGNOSIS — M1990 Unspecified osteoarthritis, unspecified site: Secondary | ICD-10-CM | POA: Diagnosis not present

## 2017-03-26 LAB — URINE CULTURE

## 2017-03-27 ENCOUNTER — Other Ambulatory Visit: Payer: Self-pay | Admitting: Family Medicine

## 2017-03-27 DIAGNOSIS — Z435 Encounter for attention to cystostomy: Secondary | ICD-10-CM | POA: Diagnosis not present

## 2017-03-27 DIAGNOSIS — M1990 Unspecified osteoarthritis, unspecified site: Secondary | ICD-10-CM | POA: Diagnosis not present

## 2017-03-27 DIAGNOSIS — I10 Essential (primary) hypertension: Secondary | ICD-10-CM | POA: Diagnosis not present

## 2017-03-27 DIAGNOSIS — G35 Multiple sclerosis: Secondary | ICD-10-CM | POA: Diagnosis not present

## 2017-03-27 DIAGNOSIS — E119 Type 2 diabetes mellitus without complications: Secondary | ICD-10-CM | POA: Diagnosis not present

## 2017-03-27 DIAGNOSIS — L89893 Pressure ulcer of other site, stage 3: Secondary | ICD-10-CM | POA: Diagnosis not present

## 2017-03-27 MED ORDER — AMOXICILLIN-POT CLAVULANATE 875-125 MG PO TABS: 1.0000 | ORAL_TABLET | Freq: Two times a day (BID) | ORAL | 0 refills | Status: DC

## 2017-03-27 NOTE — Progress Notes (Signed)
Changed antibiotic based on urine culture Pt will be notified

## 2017-03-30 DIAGNOSIS — Z435 Encounter for attention to cystostomy: Secondary | ICD-10-CM | POA: Diagnosis not present

## 2017-03-30 DIAGNOSIS — G35 Multiple sclerosis: Secondary | ICD-10-CM | POA: Diagnosis not present

## 2017-03-30 DIAGNOSIS — M1990 Unspecified osteoarthritis, unspecified site: Secondary | ICD-10-CM | POA: Diagnosis not present

## 2017-03-30 DIAGNOSIS — E119 Type 2 diabetes mellitus without complications: Secondary | ICD-10-CM | POA: Diagnosis not present

## 2017-03-30 DIAGNOSIS — I10 Essential (primary) hypertension: Secondary | ICD-10-CM | POA: Diagnosis not present

## 2017-03-30 DIAGNOSIS — N3020 Other chronic cystitis without hematuria: Secondary | ICD-10-CM | POA: Diagnosis not present

## 2017-03-30 DIAGNOSIS — L89893 Pressure ulcer of other site, stage 3: Secondary | ICD-10-CM | POA: Diagnosis not present

## 2017-03-30 DIAGNOSIS — N318 Other neuromuscular dysfunction of bladder: Secondary | ICD-10-CM | POA: Diagnosis not present

## 2017-03-31 ENCOUNTER — Telehealth: Payer: Self-pay | Admitting: Physician Assistant

## 2017-03-31 NOTE — Telephone Encounter (Signed)
Spoke with patient and advised of the results and need for medication change. She was seen at urology yesterday.  Forwarded culture results as requested.

## 2017-04-01 DIAGNOSIS — I10 Essential (primary) hypertension: Secondary | ICD-10-CM | POA: Diagnosis not present

## 2017-04-01 DIAGNOSIS — E1140 Type 2 diabetes mellitus with diabetic neuropathy, unspecified: Secondary | ICD-10-CM | POA: Diagnosis not present

## 2017-04-01 DIAGNOSIS — G35 Multiple sclerosis: Secondary | ICD-10-CM | POA: Diagnosis not present

## 2017-04-01 DIAGNOSIS — L89313 Pressure ulcer of right buttock, stage 3: Secondary | ICD-10-CM | POA: Diagnosis not present

## 2017-04-01 DIAGNOSIS — L89312 Pressure ulcer of right buttock, stage 2: Secondary | ICD-10-CM | POA: Diagnosis not present

## 2017-04-03 DIAGNOSIS — Z435 Encounter for attention to cystostomy: Secondary | ICD-10-CM | POA: Diagnosis not present

## 2017-04-03 DIAGNOSIS — L89893 Pressure ulcer of other site, stage 3: Secondary | ICD-10-CM | POA: Diagnosis not present

## 2017-04-03 DIAGNOSIS — I10 Essential (primary) hypertension: Secondary | ICD-10-CM | POA: Diagnosis not present

## 2017-04-03 DIAGNOSIS — E119 Type 2 diabetes mellitus without complications: Secondary | ICD-10-CM | POA: Diagnosis not present

## 2017-04-03 DIAGNOSIS — G35 Multiple sclerosis: Secondary | ICD-10-CM | POA: Diagnosis not present

## 2017-04-03 DIAGNOSIS — M1990 Unspecified osteoarthritis, unspecified site: Secondary | ICD-10-CM | POA: Diagnosis not present

## 2017-04-04 DIAGNOSIS — L89893 Pressure ulcer of other site, stage 3: Secondary | ICD-10-CM | POA: Diagnosis not present

## 2017-04-04 DIAGNOSIS — M1990 Unspecified osteoarthritis, unspecified site: Secondary | ICD-10-CM | POA: Diagnosis not present

## 2017-04-04 DIAGNOSIS — E119 Type 2 diabetes mellitus without complications: Secondary | ICD-10-CM | POA: Diagnosis not present

## 2017-04-04 DIAGNOSIS — G35 Multiple sclerosis: Secondary | ICD-10-CM | POA: Diagnosis not present

## 2017-04-04 DIAGNOSIS — I10 Essential (primary) hypertension: Secondary | ICD-10-CM | POA: Diagnosis not present

## 2017-04-04 DIAGNOSIS — Z435 Encounter for attention to cystostomy: Secondary | ICD-10-CM | POA: Diagnosis not present

## 2017-04-06 DIAGNOSIS — Z435 Encounter for attention to cystostomy: Secondary | ICD-10-CM | POA: Diagnosis not present

## 2017-04-06 DIAGNOSIS — L89893 Pressure ulcer of other site, stage 3: Secondary | ICD-10-CM | POA: Diagnosis not present

## 2017-04-06 DIAGNOSIS — E119 Type 2 diabetes mellitus without complications: Secondary | ICD-10-CM | POA: Diagnosis not present

## 2017-04-06 DIAGNOSIS — G35 Multiple sclerosis: Secondary | ICD-10-CM | POA: Diagnosis not present

## 2017-04-06 DIAGNOSIS — I10 Essential (primary) hypertension: Secondary | ICD-10-CM | POA: Diagnosis not present

## 2017-04-06 DIAGNOSIS — M1990 Unspecified osteoarthritis, unspecified site: Secondary | ICD-10-CM | POA: Diagnosis not present

## 2017-04-08 DIAGNOSIS — Z435 Encounter for attention to cystostomy: Secondary | ICD-10-CM | POA: Diagnosis not present

## 2017-04-08 DIAGNOSIS — G35 Multiple sclerosis: Secondary | ICD-10-CM | POA: Diagnosis not present

## 2017-04-08 DIAGNOSIS — M1990 Unspecified osteoarthritis, unspecified site: Secondary | ICD-10-CM | POA: Diagnosis not present

## 2017-04-08 DIAGNOSIS — I10 Essential (primary) hypertension: Secondary | ICD-10-CM | POA: Diagnosis not present

## 2017-04-08 DIAGNOSIS — E119 Type 2 diabetes mellitus without complications: Secondary | ICD-10-CM | POA: Diagnosis not present

## 2017-04-08 DIAGNOSIS — L89893 Pressure ulcer of other site, stage 3: Secondary | ICD-10-CM | POA: Diagnosis not present

## 2017-04-13 ENCOUNTER — Ambulatory Visit: Payer: Medicare Other | Admitting: Podiatry

## 2017-04-13 ENCOUNTER — Ambulatory Visit (INDEPENDENT_AMBULATORY_CARE_PROVIDER_SITE_OTHER): Payer: Medicare Other | Admitting: Family Medicine

## 2017-04-13 ENCOUNTER — Encounter: Payer: Self-pay | Admitting: Family Medicine

## 2017-04-13 VITALS — BP 129/79 | HR 78 | Temp 97.7°F | Resp 16 | Ht 67.0 in

## 2017-04-13 DIAGNOSIS — Z960 Presence of urogenital implants: Secondary | ICD-10-CM

## 2017-04-13 DIAGNOSIS — Z978 Presence of other specified devices: Secondary | ICD-10-CM

## 2017-04-13 DIAGNOSIS — N390 Urinary tract infection, site not specified: Secondary | ICD-10-CM

## 2017-04-13 DIAGNOSIS — E1140 Type 2 diabetes mellitus with diabetic neuropathy, unspecified: Secondary | ICD-10-CM | POA: Diagnosis not present

## 2017-04-13 DIAGNOSIS — Z79891 Long term (current) use of opiate analgesic: Secondary | ICD-10-CM | POA: Diagnosis not present

## 2017-04-13 DIAGNOSIS — Z993 Dependence on wheelchair: Secondary | ICD-10-CM | POA: Diagnosis not present

## 2017-04-13 MED ORDER — OXYCODONE-ACETAMINOPHEN 10-325 MG PO TABS: 1.0000 | ORAL_TABLET | Freq: Three times a day (TID) | ORAL | 0 refills | Status: DC | PRN

## 2017-04-13 MED ORDER — METHENAMINE-SODIUM SALICYLATE 162-162.5 MG PO TABS: ORAL_TABLET | ORAL | Status: DC

## 2017-04-13 MED ORDER — NON FORMULARY: 0 refills | Status: DC

## 2017-04-13 MED ORDER — OXYCODONE HCL ER 20 MG PO T12A: 20.0000 mg | EXTENDED_RELEASE_TABLET | Freq: Three times a day (TID) | ORAL | 0 refills | Status: DC | PRN

## 2017-04-13 NOTE — Patient Instructions (Signed)
     IF you received an x-ray today, you will receive an invoice from [facility]. Please contact [facility] at [phone] with questions or concerns regarding your invoice.   IF you received labwork today, you will receive an invoice from [facility]. Please contact [facility] at [phone] with questions or concerns regarding your invoice.   Our billing staff will not be able to assist you with questions regarding bills from these companies.  You will be contacted with the lab results as soon as they are available. The fastest way to get your results is to activate your My Chart account. Instructions are located on the last page of this paperwork. If you have not heard from us regarding the results in 2 weeks, please contact this office.     

## 2017-04-13 NOTE — Progress Notes (Signed)
Chief Complaint  Patient presents with  . Follow-up    HPI  Pt reports that she followed up with Urology who advised not to do chronic antibiotics for UTI She states that she was advised to do 100% cranberry juice for UTI and AZO She was advised to take cranberry pills with vitamin C She was told that unless she has fevers and overwhelming weakness then she should consider getting her urine checked She currently has bladder spasms from MS She states that she has not had fevers  Pain Mgmt She reports that she needs her pain medications refilled She reports that she has no changes in constipation She is doing well with her current dose and has not confusion or dizziness.    Diabetes While on cipro she stopped glipizide and her sugars were more stable Reports that her sugars are very good witout hypoglycemia States that she has enough of her medications and testing supplies She is sticking to an ADA diet  Past Medical History:  Diagnosis Date  . Arthritis   . Decubitus ulcers   . Diabetes mellitus without complication ([place])   . Hypertension   . MS (multiple sclerosis) ([place])     Current Outpatient Prescriptions  Medication Sig Dispense Refill  . Adhesive Tape (PAPER TAPE 1"X10YD) TAPE Apply to dressings as needed 4 each 6  . amoxicillin-clavulanate (AUGMENTIN) 875-125 MG tablet Take 1 tablet by mouth 2 (two) times daily. 20 tablet 0  . atorvastatin (LIPITOR) 20 MG tablet Take 20 mg by mouth daily.    . baclofen (LIORESAL) 20 MG tablet Take 1 tablet (20 mg total) by mouth 4 (four) times daily. 120 each 2  . Catheters (DOVER UNIVERSAL FOLEY TRAY) KIT Use foley tray kit to change catheter every 3 weeks or as needed 15 each 1  . Catheters (FOLEY CATHETER 2-WAY) MISC Use to aspirate urine suprapubic 15 each 1  . Cholecalciferol (D3-1000) 1000 units capsule Take 1,000 Units by mouth daily.    . Control Gel Formula Dressing (DUODERM CGF DRESSING) MISC Apply 1 each topically daily  as needed. 5 each 11  . cyanocobalamin 500 MCG tablet Take 500 mcg by mouth daily.    . Disposable Gloves MISC Use disposable gloves to change catheter as needed 200 each 11  . Exenatide ER 2 MG/0.85ML AUIJ Inject 2 mg into the skin.    . FORTAMET 1000 MG 24 hr tablet Take 1,000 mg by mouth 2 (two) times daily.  0  . furosemide (LASIX) 20 MG tablet Take 20 mg by mouth daily.    Marland Kitchen glipiZIDE (GLUCOTROL) 5 MG tablet Take 5 mg by mouth daily before lunch.    . Hydroactive Dressings (DUODERM HYDROACTIVE) PSTE Apply to pressure wounds as needed 60 g 11  . hydrochlorothiazide (HYDRODIURIL) 25 MG tablet Take 25 mg by mouth daily.    . Incontinence Supply Disposable (TENA FLEX 16 PLUS) MISC Use daily as needed 30 each 11  . Incontinence Supply Disposable (TENA PROTECT UNDERWEAR PLS/XLG) MISC Use for incontinence. 30 each 11  . Interferon Beta-1b (BETASERON) 0.3 MG KIT injection Inject 0.3 mg into the skin every other day.     . lamoTRIgine (LAMICTAL) 100 MG tablet Take 1/2 pill po x 5 days, then 1/2 pill po bid x 5 days, then 1/2 pill po tid x 5 days then 1 pill po bid 60 tablet 11  . metFORMIN (GLUCOPHAGE) 1000 MG tablet Take 1,000 mg by mouth 2 (two) times daily with a meal.    .  MOVANTIK 25 MG TABS tablet Take 25 mg by mouth every morning.  4  . NONFORMULARY OR COMPOUNDED ITEM [facility]:  Peripheral Neuropathy Cream - Bupivacaine 1%, Doxepin 3%, Gabapentin 6%, Pentoxifylline 3%, Topiramate 1%, apply 1-2 grams to affected area 3-4 times daily. 120 each 2  . nystatin ointment (MYCOSTATIN) apply to affected area(s) by topical route TWICE DAILY  2  . Ostomy Supplies (NEW IMAGE SKIN/FLANGE/TAPE) MISC 1 application by Does not apply route daily as needed. 30 each 11  . oxyCODONE (OXYCONTIN) 20 mg 12 hr tablet Take 1 tablet (20 mg total) by mouth 3 (three) times daily as needed (pain). 90 tablet 0  . oxyCODONE-acetaminophen (PERCOCET) 10-325 MG tablet Take 1 tablet by mouth every 8 (eight) hours as  needed for pain. 90 tablet 0  . potassium chloride (K-DUR,KLOR-CON) 10 MEQ tablet Take by mouth.    . Syringe, Disposable, (B-D SYRINGE LUER-LOK 30CC) 30 ML MISC Use to change catheter 15 each 0  . Wound Dressings (GRX HYDROGEL GAUZE 4X4) PADS Use gauze to clean wound daily as needed 200 each 11  . Methenamine-Sodium Salicylate (AZO URINARY TRACT DEFENSE) 162-162.5 MG TABS Take as instructed    . NON FORMULARY Power wheelchair repairs  Diagnosis code z99.3 1 each 0   No current facility-administered medications for this visit.     Allergies: No Known Allergies  Past Surgical History:  Procedure Laterality Date  . SUPRAPUBIC CATHETER INSERTION  2009    Social History   Social History  . Marital status: Single    Spouse name: N/A  . Number of children: N/A  . Years of education: N/A   Social History Main Topics  . Smoking status: Never Smoker  . Smokeless tobacco: Never Used  . Alcohol use No  . Drug use: No  . Sexual activity: Not Asked   Other Topics Concern  . None   Social History Narrative  . None    Review of Systems  Constitutional: Negative for chills and fever.  Eyes: Negative for blurred vision and double vision.  Cardiovascular: Negative for chest pain and palpitations.  Gastrointestinal: Negative for abdominal pain, nausea and vomiting.  Genitourinary: Negative for flank pain and hematuria.  Neurological: Negative for dizziness, tremors and sensory change.    Objective: Vitals:   04/13/17 1340  BP: 129/79  Pulse: 78  Resp: 16  Temp: 97.7 F (36.5 C)  TempSrc: Oral  SpO2: 98%  Height: _0  (1.702 m)    Physical Exam  Constitutional: She is oriented to person, place, and time. She appears well-developed and well-nourished.  HENT:  Head: Normocephalic and atraumatic.  Eyes: Conjunctivae and EOM are normal.  Cardiovascular: Normal rate, regular rhythm and normal heart sounds.   No murmur heard. Pulmonary/Chest: Effort normal and breath  sounds normal. No respiratory distress. She has no wheezes.  Musculoskeletal: Normal range of motion. She exhibits no edema.  Neurological: She is alert and oriented to person, place, and time.  Skin: Skin is warm. Capillary refill takes less than 2 seconds.  Psychiatric: She has a normal mood and affect. Her behavior is normal. Judgment and thought content normal.   Catheter changed without complication Good urine return in new catheter   Assessment and Plan Ambrie was seen today for follow-up.  Diagnoses and all orders for this visit:  Recurrent UTI- advised pt to follow Urology recommendations and now that she is established with Urology she should get seen for catheter changes there so that they can  check her urine and discussed the best catheter for her -     Methenamine-Sodium Salicylate (AZO URINARY TRACT DEFENSE) 162-162.5 MG TABS; Take as instructed  Indwelling Foley catheter present-foley catheter changes -     Methenamine-Sodium Salicylate (AZO URINARY TRACT DEFENSE) 162-162.5 MG TABS; Take as instructed  Controlled type 2 diabetes mellitus with diabetic neuropathy, without long-term current use of insulin ([place])- sugars improved off antibiotics  Wheelchair bound- ordered repairs, scripts faxed to pharmacy  Other orders -     NON FORMULARY; Power wheelchair repairs  Diagnosis code z99.3 -     oxyCODONE (OXYCONTIN) 20 mg 12 hr tablet; Take 1 tablet (20 mg total) by mouth 3 (three) times daily as needed (pain). -     oxyCODONE-acetaminophen (PERCOCET) 10-325 MG tablet; Take 1 tablet by mouth every 8 (eight) hours as needed for pain.     [place]

## 2017-04-15 DIAGNOSIS — E119 Type 2 diabetes mellitus without complications: Secondary | ICD-10-CM | POA: Diagnosis not present

## 2017-04-15 DIAGNOSIS — M1990 Unspecified osteoarthritis, unspecified site: Secondary | ICD-10-CM | POA: Diagnosis not present

## 2017-04-15 DIAGNOSIS — Z435 Encounter for attention to cystostomy: Secondary | ICD-10-CM | POA: Diagnosis not present

## 2017-04-15 DIAGNOSIS — I10 Essential (primary) hypertension: Secondary | ICD-10-CM | POA: Diagnosis not present

## 2017-04-15 DIAGNOSIS — L89893 Pressure ulcer of other site, stage 3: Secondary | ICD-10-CM | POA: Diagnosis not present

## 2017-04-15 DIAGNOSIS — G35 Multiple sclerosis: Secondary | ICD-10-CM | POA: Diagnosis not present

## 2017-04-20 DIAGNOSIS — E119 Type 2 diabetes mellitus without complications: Secondary | ICD-10-CM | POA: Diagnosis not present

## 2017-04-20 DIAGNOSIS — I10 Essential (primary) hypertension: Secondary | ICD-10-CM | POA: Diagnosis not present

## 2017-04-20 DIAGNOSIS — M1990 Unspecified osteoarthritis, unspecified site: Secondary | ICD-10-CM | POA: Diagnosis not present

## 2017-04-20 DIAGNOSIS — L89893 Pressure ulcer of other site, stage 3: Secondary | ICD-10-CM | POA: Diagnosis not present

## 2017-04-20 DIAGNOSIS — Z435 Encounter for attention to cystostomy: Secondary | ICD-10-CM | POA: Diagnosis not present

## 2017-04-20 DIAGNOSIS — G35 Multiple sclerosis: Secondary | ICD-10-CM | POA: Diagnosis not present

## 2017-04-22 ENCOUNTER — Encounter (HOSPITAL_BASED_OUTPATIENT_CLINIC_OR_DEPARTMENT_OTHER): Payer: Medicare Other | Attending: Internal Medicine

## 2017-04-22 DIAGNOSIS — L89313 Pressure ulcer of right buttock, stage 3: Secondary | ICD-10-CM | POA: Diagnosis not present

## 2017-04-22 DIAGNOSIS — L89312 Pressure ulcer of right buttock, stage 2: Secondary | ICD-10-CM | POA: Diagnosis not present

## 2017-04-22 DIAGNOSIS — I10 Essential (primary) hypertension: Secondary | ICD-10-CM | POA: Diagnosis not present

## 2017-04-22 DIAGNOSIS — E1140 Type 2 diabetes mellitus with diabetic neuropathy, unspecified: Secondary | ICD-10-CM | POA: Insufficient documentation

## 2017-04-22 DIAGNOSIS — G35 Multiple sclerosis: Secondary | ICD-10-CM | POA: Diagnosis not present

## 2017-04-27 ENCOUNTER — Ambulatory Visit: Payer: Medicare Other | Admitting: Podiatry

## 2017-04-27 DIAGNOSIS — I10 Essential (primary) hypertension: Secondary | ICD-10-CM | POA: Diagnosis not present

## 2017-04-27 DIAGNOSIS — Z435 Encounter for attention to cystostomy: Secondary | ICD-10-CM | POA: Diagnosis not present

## 2017-04-27 DIAGNOSIS — G35 Multiple sclerosis: Secondary | ICD-10-CM | POA: Diagnosis not present

## 2017-04-27 DIAGNOSIS — E119 Type 2 diabetes mellitus without complications: Secondary | ICD-10-CM | POA: Diagnosis not present

## 2017-04-27 DIAGNOSIS — M1990 Unspecified osteoarthritis, unspecified site: Secondary | ICD-10-CM | POA: Diagnosis not present

## 2017-04-27 DIAGNOSIS — L89893 Pressure ulcer of other site, stage 3: Secondary | ICD-10-CM | POA: Diagnosis not present

## 2017-04-29 DIAGNOSIS — M1990 Unspecified osteoarthritis, unspecified site: Secondary | ICD-10-CM | POA: Diagnosis not present

## 2017-04-29 DIAGNOSIS — I10 Essential (primary) hypertension: Secondary | ICD-10-CM | POA: Diagnosis not present

## 2017-04-29 DIAGNOSIS — L89893 Pressure ulcer of other site, stage 3: Secondary | ICD-10-CM | POA: Diagnosis not present

## 2017-04-29 DIAGNOSIS — G35 Multiple sclerosis: Secondary | ICD-10-CM | POA: Diagnosis not present

## 2017-04-29 DIAGNOSIS — Z435 Encounter for attention to cystostomy: Secondary | ICD-10-CM | POA: Diagnosis not present

## 2017-04-29 DIAGNOSIS — E119 Type 2 diabetes mellitus without complications: Secondary | ICD-10-CM | POA: Diagnosis not present

## 2017-05-01 DIAGNOSIS — L89893 Pressure ulcer of other site, stage 3: Secondary | ICD-10-CM | POA: Diagnosis not present

## 2017-05-01 DIAGNOSIS — I10 Essential (primary) hypertension: Secondary | ICD-10-CM | POA: Diagnosis not present

## 2017-05-01 DIAGNOSIS — E119 Type 2 diabetes mellitus without complications: Secondary | ICD-10-CM | POA: Diagnosis not present

## 2017-05-01 DIAGNOSIS — G35 Multiple sclerosis: Secondary | ICD-10-CM | POA: Diagnosis not present

## 2017-05-01 DIAGNOSIS — Z435 Encounter for attention to cystostomy: Secondary | ICD-10-CM | POA: Diagnosis not present

## 2017-05-01 DIAGNOSIS — M1990 Unspecified osteoarthritis, unspecified site: Secondary | ICD-10-CM | POA: Diagnosis not present

## 2017-05-04 DIAGNOSIS — L89893 Pressure ulcer of other site, stage 3: Secondary | ICD-10-CM | POA: Diagnosis not present

## 2017-05-04 DIAGNOSIS — Z435 Encounter for attention to cystostomy: Secondary | ICD-10-CM | POA: Diagnosis not present

## 2017-05-04 DIAGNOSIS — M1990 Unspecified osteoarthritis, unspecified site: Secondary | ICD-10-CM | POA: Diagnosis not present

## 2017-05-04 DIAGNOSIS — I10 Essential (primary) hypertension: Secondary | ICD-10-CM | POA: Diagnosis not present

## 2017-05-04 DIAGNOSIS — G35 Multiple sclerosis: Secondary | ICD-10-CM | POA: Diagnosis not present

## 2017-05-04 DIAGNOSIS — E119 Type 2 diabetes mellitus without complications: Secondary | ICD-10-CM | POA: Diagnosis not present

## 2017-05-06 ENCOUNTER — Encounter (HOSPITAL_BASED_OUTPATIENT_CLINIC_OR_DEPARTMENT_OTHER): Payer: Medicare Other | Attending: Internal Medicine

## 2017-05-06 DIAGNOSIS — G35 Multiple sclerosis: Secondary | ICD-10-CM | POA: Insufficient documentation

## 2017-05-06 DIAGNOSIS — Z435 Encounter for attention to cystostomy: Secondary | ICD-10-CM | POA: Diagnosis not present

## 2017-05-06 DIAGNOSIS — L89313 Pressure ulcer of right buttock, stage 3: Secondary | ICD-10-CM | POA: Insufficient documentation

## 2017-05-06 DIAGNOSIS — M1990 Unspecified osteoarthritis, unspecified site: Secondary | ICD-10-CM | POA: Diagnosis not present

## 2017-05-06 DIAGNOSIS — L89893 Pressure ulcer of other site, stage 3: Secondary | ICD-10-CM | POA: Diagnosis not present

## 2017-05-06 DIAGNOSIS — E119 Type 2 diabetes mellitus without complications: Secondary | ICD-10-CM | POA: Diagnosis not present

## 2017-05-06 DIAGNOSIS — E1140 Type 2 diabetes mellitus with diabetic neuropathy, unspecified: Secondary | ICD-10-CM | POA: Insufficient documentation

## 2017-05-06 DIAGNOSIS — I10 Essential (primary) hypertension: Secondary | ICD-10-CM | POA: Diagnosis not present

## 2017-05-06 NOTE — Telephone Encounter (Signed)
done

## 2017-05-08 DIAGNOSIS — L89893 Pressure ulcer of other site, stage 3: Secondary | ICD-10-CM | POA: Diagnosis not present

## 2017-05-08 DIAGNOSIS — G35 Multiple sclerosis: Secondary | ICD-10-CM | POA: Diagnosis not present

## 2017-05-08 DIAGNOSIS — E119 Type 2 diabetes mellitus without complications: Secondary | ICD-10-CM | POA: Diagnosis not present

## 2017-05-08 DIAGNOSIS — Z435 Encounter for attention to cystostomy: Secondary | ICD-10-CM | POA: Diagnosis not present

## 2017-05-08 DIAGNOSIS — I10 Essential (primary) hypertension: Secondary | ICD-10-CM | POA: Diagnosis not present

## 2017-05-08 DIAGNOSIS — M1990 Unspecified osteoarthritis, unspecified site: Secondary | ICD-10-CM | POA: Diagnosis not present

## 2017-05-11 ENCOUNTER — Encounter: Payer: Self-pay | Admitting: Family Medicine

## 2017-05-11 ENCOUNTER — Ambulatory Visit (INDEPENDENT_AMBULATORY_CARE_PROVIDER_SITE_OTHER): Payer: Medicare Other | Admitting: Family Medicine

## 2017-05-11 VITALS — BP 146/76 | HR 82 | Temp 97.9°F | Resp 18 | Ht 67.0 in

## 2017-05-11 DIAGNOSIS — L89893 Pressure ulcer of other site, stage 3: Secondary | ICD-10-CM | POA: Diagnosis not present

## 2017-05-11 DIAGNOSIS — I10 Essential (primary) hypertension: Secondary | ICD-10-CM | POA: Diagnosis not present

## 2017-05-11 DIAGNOSIS — D508 Other iron deficiency anemias: Secondary | ICD-10-CM | POA: Diagnosis not present

## 2017-05-11 DIAGNOSIS — E1140 Type 2 diabetes mellitus with diabetic neuropathy, unspecified: Secondary | ICD-10-CM | POA: Diagnosis not present

## 2017-05-11 DIAGNOSIS — Z993 Dependence on wheelchair: Secondary | ICD-10-CM

## 2017-05-11 DIAGNOSIS — G35 Multiple sclerosis: Secondary | ICD-10-CM | POA: Diagnosis not present

## 2017-05-11 DIAGNOSIS — Z978 Presence of other specified devices: Secondary | ICD-10-CM

## 2017-05-11 DIAGNOSIS — M1990 Unspecified osteoarthritis, unspecified site: Secondary | ICD-10-CM | POA: Diagnosis not present

## 2017-05-11 DIAGNOSIS — Z960 Presence of urogenital implants: Secondary | ICD-10-CM

## 2017-05-11 DIAGNOSIS — Z435 Encounter for attention to cystostomy: Secondary | ICD-10-CM | POA: Diagnosis not present

## 2017-05-11 DIAGNOSIS — Z79891 Long term (current) use of opiate analgesic: Secondary | ICD-10-CM | POA: Diagnosis not present

## 2017-05-11 DIAGNOSIS — E119 Type 2 diabetes mellitus without complications: Secondary | ICD-10-CM | POA: Diagnosis not present

## 2017-05-11 MED ORDER — GLIPIZIDE 5 MG PO TABS: 5.0000 mg | ORAL_TABLET | Freq: Every day | ORAL | 3 refills | Status: DC

## 2017-05-11 MED ORDER — OXYCODONE-ACETAMINOPHEN 10-325 MG PO TABS: 1.0000 | ORAL_TABLET | Freq: Three times a day (TID) | ORAL | 0 refills | Status: DC | PRN

## 2017-05-11 NOTE — Progress Notes (Signed)
Chief Complaint  Patient presents with  . Anemia    to check on her anemia  . Catheter Change    her for catheter change  . Follow-up    HPI Indwelling foley catheter change Pt reports that she still gets bladder spasms She also feels some burning and her urine is cloudy She missed her appt with Urology for her catheter change  Hypertension: Patient here for follow-up of elevated blood pressure. She is not exercising and is adherent to low salt diet.  Blood pressure is well controlled at home. Cardiac symptoms none. Patient denies chest pain, chest pressure/discomfort and irregular heart beat.   BP Readings from Last 3 Encounters:  05/11/17 (!) 146/76  04/13/17 129/79  03/23/17 135/80   Diabetes Mellitus: Patient presents for follow up of diabetes. Symptoms: none. Symptoms have been well-controlled. Patient denies foot ulcerations, hyperglycemia, hypoglycemia , increase appetite and nausea.  Evaluation to date has been included: fasting blood sugar and hemoglobin A1C.  Home sugars: BGs consistently in an acceptable range.  She needs refill of her Glipizide  Past Medical History:  Diagnosis Date  . Arthritis   . Decubitus ulcers   . Diabetes mellitus without complication ([place])   . Hypertension   . MS (multiple sclerosis) ([place])     Current Outpatient Prescriptions  Medication Sig Dispense Refill  . Adhesive Tape (PAPER TAPE 1"X10YD) TAPE Apply to dressings as needed 4 each 6  . amoxicillin-clavulanate (AUGMENTIN) 875-125 MG tablet Take 1 tablet by mouth 2 (two) times daily. 20 tablet 0  . atorvastatin (LIPITOR) 20 MG tablet Take 20 mg by mouth daily.    . baclofen (LIORESAL) 20 MG tablet Take 1 tablet (20 mg total) by mouth 4 (four) times daily. 120 each 2  . Catheters (DOVER UNIVERSAL FOLEY TRAY) KIT Use foley tray kit to change catheter every 3 weeks or as needed 15 each 1  . Catheters (FOLEY CATHETER 2-WAY) MISC Use to aspirate urine suprapubic 15 each 1  . Cholecalciferol  (D3-1000) 1000 units capsule Take 1,000 Units by mouth daily.    . Control Gel Formula Dressing (DUODERM CGF DRESSING) MISC Apply 1 each topically daily as needed. 5 each 11  . cyanocobalamin 500 MCG tablet Take 500 mcg by mouth daily.    . Disposable Gloves MISC Use disposable gloves to change catheter as needed 200 each 11  . Exenatide ER 2 MG/0.85ML AUIJ Inject 2 mg into the skin.    . FORTAMET 1000 MG 24 hr tablet Take 1,000 mg by mouth 2 (two) times daily.  0  . furosemide (LASIX) 20 MG tablet Take 20 mg by mouth daily.    Marland Kitchen glipiZIDE (GLUCOTROL) 5 MG tablet Take 1 tablet (5 mg total) by mouth daily before lunch. 90 tablet 3  . Hydroactive Dressings (DUODERM HYDROACTIVE) PSTE Apply to pressure wounds as needed 60 g 11  . hydrochlorothiazide (HYDRODIURIL) 25 MG tablet Take 25 mg by mouth daily.    . Incontinence Supply Disposable (TENA FLEX 16 PLUS) MISC Use daily as needed 30 each 11  . Incontinence Supply Disposable (TENA PROTECT UNDERWEAR PLS/XLG) MISC Use for incontinence. 30 each 11  . Interferon Beta-1b (BETASERON) 0.3 MG KIT injection Inject 0.3 mg into the skin every other day.     . lamoTRIgine (LAMICTAL) 100 MG tablet Take 1/2 pill po x 5 days, then 1/2 pill po bid x 5 days, then 1/2 pill po tid x 5 days then 1 pill po bid 60 tablet 11  .  metFORMIN (GLUCOPHAGE) 1000 MG tablet Take 1,000 mg by mouth 2 (two) times daily with a meal.    . Methenamine-Sodium Salicylate (AZO URINARY TRACT DEFENSE) 162-162.5 MG TABS Take as instructed    . MOVANTIK 25 MG TABS tablet Take 25 mg by mouth every morning.  4  . NON FORMULARY Power wheelchair repairs  Diagnosis code z99.3 1 each 0  . NONFORMULARY OR COMPOUNDED ITEM [facility]:  Peripheral Neuropathy Cream - Bupivacaine 1%, Doxepin 3%, Gabapentin 6%, Pentoxifylline 3%, Topiramate 1%, apply 1-2 grams to affected area 3-4 times daily. 120 each 2  . nystatin ointment (MYCOSTATIN) apply to affected area(s) by topical route TWICE DAILY  2  .  Ostomy Supplies (NEW IMAGE SKIN/FLANGE/TAPE) MISC 1 application by Does not apply route daily as needed. 30 each 11  . oxyCODONE (OXYCONTIN) 20 mg 12 hr tablet Take 1 tablet (20 mg total) by mouth 3 (three) times daily as needed (pain). 90 tablet 0  . oxyCODONE-acetaminophen (PERCOCET) 10-325 MG tablet Take 1 tablet by mouth every 8 (eight) hours as needed for pain. 90 tablet 0  . potassium chloride (K-DUR,KLOR-CON) 10 MEQ tablet Take by mouth.    . Syringe, Disposable, (B-D SYRINGE LUER-LOK 30CC) 30 ML MISC Use to change catheter 15 each 0  . Wound Dressings (GRX HYDROGEL GAUZE 4X4) PADS Use gauze to clean wound daily as needed 200 each 11   No current facility-administered medications for this visit.     Allergies: No Known Allergies  Past Surgical History:  Procedure Laterality Date  . SUPRAPUBIC CATHETER INSERTION  2009    Social History   Social History  . Marital status: Single    Spouse name: N/A  . Number of children: N/A  . Years of education: N/A   Social History Main Topics  . Smoking status: Never Smoker  . Smokeless tobacco: Never Used  . Alcohol use No  . Drug use: No  . Sexual activity: Not Asked   Other Topics Concern  . None   Social History Narrative  . None    ROS See hpi  Objective: Vitals:   05/11/17 1402  BP: (!) 146/76  Pulse: 82  Resp: 18  Temp: 97.9 F (36.6 C)  TempSrc: Oral  SpO2: 97%  Height: 5\' 7"  (1.702 m)    Physical Exam  Constitutional: She appears well-developed and well-nourished.  HENT:  Head: Normocephalic and atraumatic.  Cardiovascular: Normal rate, regular rhythm and normal heart sounds.   Pulmonary/Chest: Effort normal and breath sounds normal. No respiratory distress. She has no wheezes.  Skin: Skin is warm. Capillary refill takes less than 2 seconds.  Psychiatric: She has a normal mood and affect. Her behavior is normal. Judgment and thought content normal.    Assessment and Plan [name] was seen today for  anemia, catheter change and follow-up.  Diagnoses and all orders for this visit:  Indwelling Foley catheter present- foley catheter changed  Controlled type 2 diabetes mellitus with diabetic neuropathy, without long-term current use of insulin ([facility])- will check a1c Refilled glipizide -     Hemoglobin A1c -     glipiZIDE (GLUCOTROL) 5 MG tablet; Take 1 tablet (5 mg total) by mouth daily before lunch.  Essential hypertension- bp in good range  Wheelchair bound- pt decubitus ulcers   Other iron deficiency anemia- will check iron -     CBC  Chronic Opiate -     oxyCODONE-acetaminophen (PERCOCET) 10-325 MG tablet; Take 1 tablet by mouth every 8 (eight)  hours as needed for pain.     [place]

## 2017-05-11 NOTE — Patient Instructions (Signed)
     IF you received an x-ray today, you will receive an invoice from [facility]. Please contact [facility] at [phone] with questions or concerns regarding your invoice.   IF you received labwork today, you will receive an invoice from [facility]. Please contact [facility] at [phone] with questions or concerns regarding your invoice.   Our billing staff will not be able to assist you with questions regarding bills from these companies.  You will be contacted with the lab results as soon as they are available. The fastest way to get your results is to activate your My Chart account. Instructions are located on the last page of this paperwork. If you have not heard from us regarding the results in 2 weeks, please contact this office.     

## 2017-05-12 LAB — CBC
HEMOGLOBIN: 10.4 g/dL — AB (ref 11.1–15.9)
Hematocrit: 32.8 % — ABNORMAL LOW (ref 34.0–46.6)
MCH: 25.8 pg — ABNORMAL LOW (ref 26.6–33.0)
MCHC: 31.7 g/dL (ref 31.5–35.7)
MCV: 81 fL (ref 79–97)
Platelets: 455 10*3/uL — ABNORMAL HIGH (ref 150–379)
RBC: 4.03 x10E6/uL (ref 3.77–5.28)
RDW: 15.4 % (ref 12.3–15.4)
WBC: 7.4 10*3/uL (ref 3.4–10.8)

## 2017-05-12 LAB — HEMOGLOBIN A1C
Est. average glucose Bld gHb Est-mCnc: 137 mg/dL
Hgb A1c MFr Bld: 6.4 % — ABNORMAL HIGH (ref 4.8–5.6)

## 2017-05-13 DIAGNOSIS — L89313 Pressure ulcer of right buttock, stage 3: Secondary | ICD-10-CM | POA: Diagnosis not present

## 2017-05-13 DIAGNOSIS — E1140 Type 2 diabetes mellitus with diabetic neuropathy, unspecified: Secondary | ICD-10-CM | POA: Diagnosis not present

## 2017-05-13 DIAGNOSIS — G35 Multiple sclerosis: Secondary | ICD-10-CM | POA: Diagnosis not present

## 2017-05-15 DIAGNOSIS — M1990 Unspecified osteoarthritis, unspecified site: Secondary | ICD-10-CM | POA: Diagnosis not present

## 2017-05-15 DIAGNOSIS — Z435 Encounter for attention to cystostomy: Secondary | ICD-10-CM | POA: Diagnosis not present

## 2017-05-15 DIAGNOSIS — G35 Multiple sclerosis: Secondary | ICD-10-CM | POA: Diagnosis not present

## 2017-05-15 DIAGNOSIS — E119 Type 2 diabetes mellitus without complications: Secondary | ICD-10-CM | POA: Diagnosis not present

## 2017-05-15 DIAGNOSIS — L89893 Pressure ulcer of other site, stage 3: Secondary | ICD-10-CM | POA: Diagnosis not present

## 2017-05-15 DIAGNOSIS — I10 Essential (primary) hypertension: Secondary | ICD-10-CM | POA: Diagnosis not present

## 2017-05-16 ENCOUNTER — Ambulatory Visit: Payer: Medicare Other | Admitting: Podiatry

## 2017-05-18 ENCOUNTER — Ambulatory Visit (INDEPENDENT_AMBULATORY_CARE_PROVIDER_SITE_OTHER): Payer: Medicare Other | Admitting: Neurology

## 2017-05-18 ENCOUNTER — Encounter: Payer: Self-pay | Admitting: Neurology

## 2017-05-18 ENCOUNTER — Encounter: Payer: Self-pay | Admitting: Family Medicine

## 2017-05-18 VITALS — BP 127/73 | HR 85 | Resp 18 | Ht 67.0 in | Wt 205.0 lb

## 2017-05-18 DIAGNOSIS — Z435 Encounter for attention to cystostomy: Secondary | ICD-10-CM | POA: Diagnosis not present

## 2017-05-18 DIAGNOSIS — I10 Essential (primary) hypertension: Secondary | ICD-10-CM | POA: Diagnosis not present

## 2017-05-18 DIAGNOSIS — M1990 Unspecified osteoarthritis, unspecified site: Secondary | ICD-10-CM | POA: Diagnosis not present

## 2017-05-18 DIAGNOSIS — G8220 Paraplegia, unspecified: Secondary | ICD-10-CM

## 2017-05-18 DIAGNOSIS — G35 Multiple sclerosis: Secondary | ICD-10-CM | POA: Diagnosis not present

## 2017-05-18 DIAGNOSIS — L89893 Pressure ulcer of other site, stage 3: Secondary | ICD-10-CM | POA: Diagnosis not present

## 2017-05-18 DIAGNOSIS — E1142 Type 2 diabetes mellitus with diabetic polyneuropathy: Secondary | ICD-10-CM | POA: Diagnosis not present

## 2017-05-18 DIAGNOSIS — R208 Other disturbances of skin sensation: Secondary | ICD-10-CM | POA: Diagnosis not present

## 2017-05-18 DIAGNOSIS — E119 Type 2 diabetes mellitus without complications: Secondary | ICD-10-CM | POA: Diagnosis not present

## 2017-05-18 NOTE — Progress Notes (Signed)
[facility]  PATIENT: [name] DOB: [birth year]  REFERRING DOCTOR OR PCP:  Delia Chimes SOURCE: patient, records from Dr. Nolon Rod  _________________________________   HISTORICAL  CHIEF COMPLAINT:  Chief Complaint  Patient presents with  . Multiple Sclerosis    Sts. she continues to tolerate Betaseron well.  She stopped Lamictal b/c she didn't feel it helped burning pain in feet.  Denies new or worsening sx/fim    HISTORY OF PRESENT ILLNESS:  [name] is a 56 year old woman who was diagnosed with MS in 2006.  MS:   She is on Betaseron. She had exacerbations while on Copaxone. She tolerates the Betaseron well and is not interested in switching to a different medication was more than 10 years ago. She is claustrophobic and does not wish to have another MRI if possible.  Gait/strength/sensation: She has been wheelchair bound for several years and was using a walker since 2007 shortly after her diagnosis. She helps him with transfers cannot transfer independently. Her arms remained strong but she has weakness in both legs with spasticity. Muscle spasticity is worse at night but can occur during the day as well.   She takes baclofen 20 mg 3 times a day. She denies any numbness or tingling in the arms. She has a mild burning sensation in her feet. Lamotrigine did not help the dysesthetic sensation.   Bladder/bowel: She has a suprapubic catheter since 2009 due to urinary retention. She denies any bowel dysfunction.  She gets frequent UTI's     She recently started taking cranberries and drinks lots of fluids to try to prevent them.    Vision:  She notes some difficulty with her left eye and has been told by her ophthalmologist that it is due to a combination of MS and diabetes.    Fatigue/sleep: She denies any significant problems with fatigue. She sleeps well at night. She gets some spasms when she lays down but once she falls asleep she stays asleep.    Mood/cognition:  She feels her mood is good. She denies any depression or anxiety. She denies any difficulty with her memory or other cognitive skills.   MS History:   In 2006, she presented with tingling in her hands and right greater than left leg weakness.    She saw Dr. Luberta Robertson in [facility]. She was placed on Copaxone. Unfortunately,  she had a major exacerbation later that year and lost most of the use of her legs.  She was then placed on Betaseron and remains on Betaseron. She does not think she has had any exacerbations while she is on it. She tolerates it well and does not have any skin reactions.    Since 2007, she has been predominantly wheelchair bound strength has progressively worsened and she went from using a walker for short distances to being completely wheelchair bound a few years ago.   Her last MRI was greater than 10 years ago.  REVIEW OF SYSTEMS: Constitutional: No fevers, chills, sweats, or change in appetite Eyes: Reduced left vision.  No eye pain Ear, nose and throat: No hearing loss, ear pain, nasal congestion, sore throat Cardiovascular: No chest pain, palpitations Respiratory: No shortness of breath at rest or with exertion.   No wheezes GastrointestinaI: No nausea, vomiting, diarrhea, abdominal pain, fecal incontinence Genitourinary: She has a suprapubic catheter.. Musculoskeletal: No neck pain, back pain Integumentary: No rash, pruritus, skin lesions Neurological: as above Psychiatric: Denies depression at this time.  No anxiety Endocrine: has NIDDM  Hematologic/Lymphatic: No anemia, purpura, petechiae. Allergic/Immunologic: No itchy/runny eyes, nasal congestion, recent allergic reactions, rashes  ALLERGIES: No Known Allergies  HOME MEDICATIONS:  Current Outpatient Prescriptions:  .  Adhesive Tape (PAPER TAPE 1"X10YD) TAPE, Apply to dressings as needed, Disp: 4 each, Rfl: 6 .  amoxicillin-clavulanate (AUGMENTIN) 875-125 MG tablet, Take 1 tablet by mouth  2 (two) times daily., Disp: 20 tablet, Rfl: 0 .  atorvastatin (LIPITOR) 20 MG tablet, Take 20 mg by mouth daily., Disp: , Rfl:  .  baclofen (LIORESAL) 20 MG tablet, Take 1 tablet (20 mg total) by mouth 4 (four) times daily., Disp: 120 each, Rfl: 2 .  Catheters (DOVER UNIVERSAL FOLEY TRAY) KIT, Use foley tray kit to change catheter every 3 weeks or as needed, Disp: 15 each, Rfl: 1 .  Catheters (FOLEY CATHETER 2-WAY) MISC, Use to aspirate urine suprapubic, Disp: 15 each, Rfl: 1 .  Cholecalciferol (D3-1000) 1000 units capsule, Take 1,000 Units by mouth daily., Disp: , Rfl:  .  Control Gel Formula Dressing (DUODERM CGF DRESSING) MISC, Apply 1 each topically daily as needed., Disp: 5 each, Rfl: 11 .  cyanocobalamin 500 MCG tablet, Take 500 mcg by mouth daily., Disp: , Rfl:  .  Disposable Gloves MISC, Use disposable gloves to change catheter as needed, Disp: 200 each, Rfl: 11 .  Exenatide ER 2 MG/0.85ML AUIJ, Inject 2 mg into the skin., Disp: , Rfl:  .  FORTAMET 1000 MG 24 hr tablet, Take 1,000 mg by mouth 2 (two) times daily., Disp: , Rfl: 0 .  furosemide (LASIX) 20 MG tablet, Take 20 mg by mouth daily., Disp: , Rfl:  .  glipiZIDE (GLUCOTROL) 5 MG tablet, Take 1 tablet (5 mg total) by mouth daily before lunch., Disp: 90 tablet, Rfl: 3 .  Hydroactive Dressings (DUODERM HYDROACTIVE) PSTE, Apply to pressure wounds as needed, Disp: 60 g, Rfl: 11 .  hydrochlorothiazide (HYDRODIURIL) 25 MG tablet, Take 25 mg by mouth daily., Disp: , Rfl:  .  Incontinence Supply Disposable (TENA FLEX 16 PLUS) MISC, Use daily as needed, Disp: 30 each, Rfl: 11 .  Incontinence Supply Disposable (TENA PROTECT UNDERWEAR PLS/XLG) MISC, Use for incontinence., Disp: 30 each, Rfl: 11 .  Interferon Beta-1b (BETASERON) 0.3 MG KIT injection, Inject 0.3 mg into the skin every other day. , Disp: , Rfl:  .  metFORMIN (GLUCOPHAGE) 1000 MG tablet, Take 1,000 mg by mouth 2 (two) times daily with a meal., Disp: , Rfl:  .  Methenamine-Sodium  Salicylate (AZO URINARY TRACT DEFENSE) 162-162.5 MG TABS, Take as instructed, Disp: , Rfl:  .  MOVANTIK 25 MG TABS tablet, Take 25 mg by mouth every morning., Disp: , Rfl: 4 .  NON FORMULARY, Power wheelchair repairs  Diagnosis code z99.3, Disp: 1 each, Rfl: 0 .  NONFORMULARY OR COMPOUNDED ITEM, [facility]:  Peripheral Neuropathy Cream - Bupivacaine 1%, Doxepin 3%, Gabapentin 6%, Pentoxifylline 3%, Topiramate 1%, apply 1-2 grams to affected area 3-4 times daily., Disp: 120 each, Rfl: 2 .  nystatin ointment (MYCOSTATIN), apply to affected area(s) by topical route TWICE DAILY, Disp: , Rfl: 2 .  Ostomy Supplies (NEW IMAGE SKIN/FLANGE/TAPE) MISC, 1 application by Does not apply route daily as needed., Disp: 30 each, Rfl: 11 .  oxyCODONE (OXYCONTIN) 20 mg 12 hr tablet, Take 1 tablet (20 mg total) by mouth 3 (three) times daily as needed (pain)., Disp: 90 tablet, Rfl: 0 .  oxyCODONE-acetaminophen (PERCOCET) 10-325 MG tablet, Take 1 tablet by mouth every 8 (eight) hours as needed for  pain., Disp: 90 tablet, Rfl: 0 .  potassium chloride (K-DUR,KLOR-CON) 10 MEQ tablet, Take by mouth., Disp: , Rfl:  .  Syringe, Disposable, (B-D SYRINGE LUER-LOK 30CC) 30 ML MISC, Use to change catheter, Disp: 15 each, Rfl: 0 .  Wound Dressings (GRX HYDROGEL GAUZE 4X4) PADS, Use gauze to clean wound daily as needed, Disp: 200 each, Rfl: 11 .  lamoTRIgine (LAMICTAL) 100 MG tablet, Take 1/2 pill po x 5 days, then 1/2 pill po bid x 5 days, then 1/2 pill po tid x 5 days then 1 pill po bid (Patient not taking: Reported on 05/18/2017), Disp: 60 tablet, Rfl: 11 .  lidocaine (XYLOCAINE) 2 % jelly, USE when need with dressings, Disp: , Rfl: 1  PAST MEDICAL HISTORY: Past Medical History:  Diagnosis Date  . Arthritis   . Decubitus ulcers   . Diabetes mellitus without complication ([place])   . Hypertension   . MS (multiple sclerosis) ([place])     PAST SURGICAL HISTORY: Past Surgical History:  Procedure Laterality Date  .  SUPRAPUBIC CATHETER INSERTION  2009    FAMILY HISTORY: Family History  Problem Relation Age of Onset  . Cancer Mother   . Hypertension Mother   . Heart disease Father   . Diabetes Father   . Hypertension Sister   . Hypertension Brother     SOCIAL HISTORY:  Social History   Social History  . Marital status: Single    Spouse name: N/A  . Number of children: N/A  . Years of education: N/A   Occupational History  . Not on file.   Social History Main Topics  . Smoking status: Never Smoker  . Smokeless tobacco: Never Used  . Alcohol use No  . Drug use: No  . Sexual activity: Not on file   Other Topics Concern  . Not on file   Social History Narrative  . No narrative on file     PHYSICAL EXAM  Vitals:   05/18/17 1434  BP: 127/73  Pulse: 85  Resp: 18  Weight: 205 lb (93 kg)  Height: _0  (1.702 m)    Body mass index is 32.11 kg/m.   General: The patient is well-developed and well-nourished and in no acute distress in a wheelchair  Neck:    The neck is nontender with a good range of motion.   Skin/Ext.: Arms/legs without rash.   She has mild pedal edema.  Neurologic Exam  Mental status: The patient is alert and oriented x 3 at the time of the examination. The patient has apparent normal recent and remote memory, with an apparently normal attention span and concentration ability.   Speech is normal.  Cranial nerves: Extraocular movements are full. Her pupils react equally to light and accommodation. Sensation is normal. Trapezius and sternocleidomastoid strength is normal. No dysarthria is noted.  The tongue is midline, and the patient has symmetric elevation of the soft palate. No obvious hearing deficits are noted.  Motor:  Muscle bulk is normal.   Tone is increased in both legs, slightly more on the right than the left. She has normal strength in both arms. However strength is only 0-1/5 in both legs, probably slightly worse on the right.   Sensory:  She reports normal sensation to touch and vibration in the arms. She has normal sensation in the proximal legs to touch and vibration. However, there is severe loss of vibration sensation in her toes and decrease temperature sensation in the lower legs.   Coordination:  Cerebellar testing reveals good finger-nose-finger .  RAM in hands is normal.   Gait and station: She is wheelchair bound and cannot stand   Reflexes: Deep tendon reflexes are normal in the arms. She has increased reflexes at the knees with spread. She has reduced reflexes at the ankles. There is no clonus.     DIAGNOSTIC DATA (LABS, IMAGING, TESTING) - I reviewed patient records, labs, notes, testing and imaging myself where available.      ASSESSMENT AND PLAN  Multiple sclerosis ([place])  Diplegia of both lower extremities ([facility])  Diabetic polyneuropathy associated with type 2 diabetes mellitus ([place])  Dysesthesia    1.   Continue Betaseron. We again discussed getting an MRI of the brain and spinal cord but she is claustrophobic and does not want to have another MRI done if at all possible.  2.   Increase baclofen to 20 mg by mouth every morning, 20 mg by mouth every afternoon and 40 mg by mouth daily at bedtime.  3.    Continue her other medications.  4.    Follow-up in 6 months but call sooner if she notes any new or worsening neurologic symptoms.   Richard A. Felecia Shelling, MD, PhD 9/45/0388, 8:28 PM Certified in Neurology, Clinical Neurophysiology, Sleep Medicine, Pain Medicine and Neuroimaging  [facility] [address] [place] [place] [place], [place] [address] [phone]

## 2017-05-20 DIAGNOSIS — E119 Type 2 diabetes mellitus without complications: Secondary | ICD-10-CM | POA: Diagnosis not present

## 2017-05-20 DIAGNOSIS — M1990 Unspecified osteoarthritis, unspecified site: Secondary | ICD-10-CM | POA: Diagnosis not present

## 2017-05-20 DIAGNOSIS — Z435 Encounter for attention to cystostomy: Secondary | ICD-10-CM | POA: Diagnosis not present

## 2017-05-20 DIAGNOSIS — G35 Multiple sclerosis: Secondary | ICD-10-CM | POA: Diagnosis not present

## 2017-05-20 DIAGNOSIS — L89893 Pressure ulcer of other site, stage 3: Secondary | ICD-10-CM | POA: Diagnosis not present

## 2017-05-20 DIAGNOSIS — I10 Essential (primary) hypertension: Secondary | ICD-10-CM | POA: Diagnosis not present

## 2017-05-25 DIAGNOSIS — E782 Mixed hyperlipidemia: Secondary | ICD-10-CM | POA: Diagnosis not present

## 2017-05-25 DIAGNOSIS — E1142 Type 2 diabetes mellitus with diabetic polyneuropathy: Secondary | ICD-10-CM | POA: Diagnosis not present

## 2017-05-25 DIAGNOSIS — L89893 Pressure ulcer of other site, stage 3: Secondary | ICD-10-CM | POA: Diagnosis not present

## 2017-05-25 DIAGNOSIS — G35 Multiple sclerosis: Secondary | ICD-10-CM | POA: Diagnosis not present

## 2017-05-25 DIAGNOSIS — Z435 Encounter for attention to cystostomy: Secondary | ICD-10-CM | POA: Diagnosis not present

## 2017-05-25 DIAGNOSIS — E119 Type 2 diabetes mellitus without complications: Secondary | ICD-10-CM | POA: Diagnosis not present

## 2017-05-25 DIAGNOSIS — M1990 Unspecified osteoarthritis, unspecified site: Secondary | ICD-10-CM | POA: Diagnosis not present

## 2017-05-25 DIAGNOSIS — I10 Essential (primary) hypertension: Secondary | ICD-10-CM | POA: Diagnosis not present

## 2017-05-27 DIAGNOSIS — L89313 Pressure ulcer of right buttock, stage 3: Secondary | ICD-10-CM | POA: Diagnosis not present

## 2017-05-27 DIAGNOSIS — E1140 Type 2 diabetes mellitus with diabetic neuropathy, unspecified: Secondary | ICD-10-CM | POA: Diagnosis not present

## 2017-05-27 DIAGNOSIS — G35 Multiple sclerosis: Secondary | ICD-10-CM | POA: Diagnosis not present

## 2017-05-29 DIAGNOSIS — I10 Essential (primary) hypertension: Secondary | ICD-10-CM | POA: Diagnosis not present

## 2017-05-29 DIAGNOSIS — L89893 Pressure ulcer of other site, stage 3: Secondary | ICD-10-CM | POA: Diagnosis not present

## 2017-05-29 DIAGNOSIS — M1990 Unspecified osteoarthritis, unspecified site: Secondary | ICD-10-CM | POA: Diagnosis not present

## 2017-05-29 DIAGNOSIS — G35 Multiple sclerosis: Secondary | ICD-10-CM | POA: Diagnosis not present

## 2017-05-29 DIAGNOSIS — E119 Type 2 diabetes mellitus without complications: Secondary | ICD-10-CM | POA: Diagnosis not present

## 2017-05-29 DIAGNOSIS — Z435 Encounter for attention to cystostomy: Secondary | ICD-10-CM | POA: Diagnosis not present

## 2017-06-01 ENCOUNTER — Ambulatory Visit (INDEPENDENT_AMBULATORY_CARE_PROVIDER_SITE_OTHER): Payer: Medicare Other | Admitting: Podiatry

## 2017-06-01 DIAGNOSIS — E0842 Diabetes mellitus due to underlying condition with diabetic polyneuropathy: Secondary | ICD-10-CM

## 2017-06-01 DIAGNOSIS — L84 Corns and callosities: Secondary | ICD-10-CM

## 2017-06-02 NOTE — Progress Notes (Signed)
Patient ID: [name], female   DOB: [birth year], 56 y.o.   MRN: [id number]   Subjective: Patient with past medical history of T2DM presents to the office today for follow-up evaluation of a pre-ulcerative callus to the left fifth MPJ. She states the area has become very tender to the touch. She denies any new complaints at this time. Pt is wheelchair bound.  Objective:  Physical Exam General: Alert and oriented x3 in no acute distress  Dermatology: Hyperkeratotic lesion present on the weightbearing surface of the left foot. Pain on palpation with a central nucleated core noted.  Skin is warm, dry and supple bilateral lower extremities. Negative for open lesions or macerations.  Vascular: Palpable pedal pulses bilaterally. No edema or erythema noted. Capillary refill within normal limits.  Neurological: Epicritic and protective threshold grossly intact bilaterally.   Musculoskeletal Exam: Pain on palpation at the keratotic lesion noted. Range of motion within normal limits bilateral. Muscle strength 5/5 in all groups bilateral.  Assessment: #1 diabetes mellitus with neuropathy #2 pre-ulcerative callous left sub fifth MPJ   Plan of Care:  #1 Patient evaluated. #2 authorization for diabetic shoes. Patient molded today.  #3 Excisional debridement of keratoic lesion using a chisel blade was performed without incident.  #4 Treated area(s) with Salinocaine and dressed with light dressing. #5 Return to clinic in 3 months or sooner to pick up DM shoes.   Edrick Kins, DPM [facility]  Dr. Edrick Kins, [place]                                        [place], [place] [address]                Office [phone]  Fax [phone]

## 2017-06-03 DIAGNOSIS — E119 Type 2 diabetes mellitus without complications: Secondary | ICD-10-CM | POA: Diagnosis not present

## 2017-06-03 DIAGNOSIS — G35 Multiple sclerosis: Secondary | ICD-10-CM | POA: Diagnosis not present

## 2017-06-03 DIAGNOSIS — I10 Essential (primary) hypertension: Secondary | ICD-10-CM | POA: Diagnosis not present

## 2017-06-03 DIAGNOSIS — Z435 Encounter for attention to cystostomy: Secondary | ICD-10-CM | POA: Diagnosis not present

## 2017-06-03 DIAGNOSIS — M1990 Unspecified osteoarthritis, unspecified site: Secondary | ICD-10-CM | POA: Diagnosis not present

## 2017-06-03 DIAGNOSIS — L89893 Pressure ulcer of other site, stage 3: Secondary | ICD-10-CM | POA: Diagnosis not present

## 2017-06-05 DIAGNOSIS — I10 Essential (primary) hypertension: Secondary | ICD-10-CM | POA: Diagnosis not present

## 2017-06-05 DIAGNOSIS — L89893 Pressure ulcer of other site, stage 3: Secondary | ICD-10-CM | POA: Diagnosis not present

## 2017-06-05 DIAGNOSIS — G35 Multiple sclerosis: Secondary | ICD-10-CM | POA: Diagnosis not present

## 2017-06-05 DIAGNOSIS — M1990 Unspecified osteoarthritis, unspecified site: Secondary | ICD-10-CM | POA: Diagnosis not present

## 2017-06-05 DIAGNOSIS — Z435 Encounter for attention to cystostomy: Secondary | ICD-10-CM | POA: Diagnosis not present

## 2017-06-05 DIAGNOSIS — E119 Type 2 diabetes mellitus without complications: Secondary | ICD-10-CM | POA: Diagnosis not present

## 2017-06-06 ENCOUNTER — Telehealth: Payer: Self-pay | Admitting: Family Medicine

## 2017-06-06 NOTE — Telephone Encounter (Signed)
Daughter came and dropped off some ppw for stallings to fill out.. I put it in [phone] in the intermail box.

## 2017-06-07 NOTE — Telephone Encounter (Signed)
Faxed Physician's verification and medical release form faxed over to [facility]: [id number], [address]  [place], [place] [address].  Confirmation received at 6:31 pm. dpg

## 2017-06-08 DIAGNOSIS — M1990 Unspecified osteoarthritis, unspecified site: Secondary | ICD-10-CM | POA: Diagnosis not present

## 2017-06-08 DIAGNOSIS — I10 Essential (primary) hypertension: Secondary | ICD-10-CM | POA: Diagnosis not present

## 2017-06-08 DIAGNOSIS — G35 Multiple sclerosis: Secondary | ICD-10-CM | POA: Diagnosis not present

## 2017-06-08 DIAGNOSIS — L89893 Pressure ulcer of other site, stage 3: Secondary | ICD-10-CM | POA: Diagnosis not present

## 2017-06-08 DIAGNOSIS — E119 Type 2 diabetes mellitus without complications: Secondary | ICD-10-CM | POA: Diagnosis not present

## 2017-06-08 DIAGNOSIS — Z435 Encounter for attention to cystostomy: Secondary | ICD-10-CM | POA: Diagnosis not present

## 2017-06-10 ENCOUNTER — Encounter (HOSPITAL_BASED_OUTPATIENT_CLINIC_OR_DEPARTMENT_OTHER): Payer: Medicare Other | Attending: Internal Medicine

## 2017-06-10 DIAGNOSIS — L89312 Pressure ulcer of right buttock, stage 2: Secondary | ICD-10-CM | POA: Insufficient documentation

## 2017-06-10 DIAGNOSIS — L89319 Pressure ulcer of right buttock, unspecified stage: Secondary | ICD-10-CM | POA: Diagnosis not present

## 2017-06-10 DIAGNOSIS — E1140 Type 2 diabetes mellitus with diabetic neuropathy, unspecified: Secondary | ICD-10-CM | POA: Insufficient documentation

## 2017-06-10 DIAGNOSIS — G35 Multiple sclerosis: Secondary | ICD-10-CM | POA: Diagnosis not present

## 2017-06-10 DIAGNOSIS — I10 Essential (primary) hypertension: Secondary | ICD-10-CM | POA: Insufficient documentation

## 2017-06-13 DIAGNOSIS — L89893 Pressure ulcer of other site, stage 3: Secondary | ICD-10-CM | POA: Diagnosis not present

## 2017-06-13 DIAGNOSIS — M1990 Unspecified osteoarthritis, unspecified site: Secondary | ICD-10-CM | POA: Diagnosis not present

## 2017-06-13 DIAGNOSIS — I10 Essential (primary) hypertension: Secondary | ICD-10-CM | POA: Diagnosis not present

## 2017-06-13 DIAGNOSIS — E119 Type 2 diabetes mellitus without complications: Secondary | ICD-10-CM | POA: Diagnosis not present

## 2017-06-13 DIAGNOSIS — G35 Multiple sclerosis: Secondary | ICD-10-CM | POA: Diagnosis not present

## 2017-06-13 DIAGNOSIS — Z435 Encounter for attention to cystostomy: Secondary | ICD-10-CM | POA: Diagnosis not present

## 2017-06-14 DIAGNOSIS — E119 Type 2 diabetes mellitus without complications: Secondary | ICD-10-CM | POA: Diagnosis not present

## 2017-06-14 DIAGNOSIS — G35 Multiple sclerosis: Secondary | ICD-10-CM | POA: Diagnosis not present

## 2017-06-14 DIAGNOSIS — I10 Essential (primary) hypertension: Secondary | ICD-10-CM | POA: Diagnosis not present

## 2017-06-14 DIAGNOSIS — N318 Other neuromuscular dysfunction of bladder: Secondary | ICD-10-CM | POA: Diagnosis not present

## 2017-06-14 DIAGNOSIS — Z435 Encounter for attention to cystostomy: Secondary | ICD-10-CM | POA: Diagnosis not present

## 2017-06-14 DIAGNOSIS — L89893 Pressure ulcer of other site, stage 3: Secondary | ICD-10-CM | POA: Diagnosis not present

## 2017-06-14 DIAGNOSIS — M1990 Unspecified osteoarthritis, unspecified site: Secondary | ICD-10-CM | POA: Diagnosis not present

## 2017-06-15 ENCOUNTER — Telehealth: Payer: Self-pay | Admitting: Family Medicine

## 2017-06-15 DIAGNOSIS — G35 Multiple sclerosis: Secondary | ICD-10-CM | POA: Diagnosis not present

## 2017-06-15 DIAGNOSIS — I10 Essential (primary) hypertension: Secondary | ICD-10-CM | POA: Diagnosis not present

## 2017-06-15 DIAGNOSIS — M1990 Unspecified osteoarthritis, unspecified site: Secondary | ICD-10-CM | POA: Diagnosis not present

## 2017-06-15 DIAGNOSIS — L89893 Pressure ulcer of other site, stage 3: Secondary | ICD-10-CM | POA: Diagnosis not present

## 2017-06-15 DIAGNOSIS — Z435 Encounter for attention to cystostomy: Secondary | ICD-10-CM | POA: Diagnosis not present

## 2017-06-15 DIAGNOSIS — E119 Type 2 diabetes mellitus without complications: Secondary | ICD-10-CM | POA: Diagnosis not present

## 2017-06-15 NOTE — Telephone Encounter (Signed)
Pt called in asking if she can have her refills on 2 meds oxycodine & oxycodone.   Please advise

## 2017-06-15 NOTE — Telephone Encounter (Signed)
Dr. Nolon Rod, the patient need refill on her Oxycodone (oxycontine) 20 mg. Last refill was on 5/9 #90 - no refill .  Also, she need refill on Percocet 10-325 mg. Last refill was on 6/6 #90, no refill. Thank you.

## 2017-06-17 DIAGNOSIS — Z435 Encounter for attention to cystostomy: Secondary | ICD-10-CM | POA: Diagnosis not present

## 2017-06-17 DIAGNOSIS — M1990 Unspecified osteoarthritis, unspecified site: Secondary | ICD-10-CM | POA: Diagnosis not present

## 2017-06-17 DIAGNOSIS — E119 Type 2 diabetes mellitus without complications: Secondary | ICD-10-CM | POA: Diagnosis not present

## 2017-06-17 DIAGNOSIS — I10 Essential (primary) hypertension: Secondary | ICD-10-CM | POA: Diagnosis not present

## 2017-06-17 DIAGNOSIS — G35 Multiple sclerosis: Secondary | ICD-10-CM | POA: Diagnosis not present

## 2017-06-17 DIAGNOSIS — L89893 Pressure ulcer of other site, stage 3: Secondary | ICD-10-CM | POA: Diagnosis not present

## 2017-06-20 DIAGNOSIS — G35 Multiple sclerosis: Secondary | ICD-10-CM | POA: Diagnosis not present

## 2017-06-20 DIAGNOSIS — Z435 Encounter for attention to cystostomy: Secondary | ICD-10-CM | POA: Diagnosis not present

## 2017-06-20 DIAGNOSIS — L89893 Pressure ulcer of other site, stage 3: Secondary | ICD-10-CM | POA: Diagnosis not present

## 2017-06-20 DIAGNOSIS — E119 Type 2 diabetes mellitus without complications: Secondary | ICD-10-CM | POA: Diagnosis not present

## 2017-06-20 DIAGNOSIS — I10 Essential (primary) hypertension: Secondary | ICD-10-CM | POA: Diagnosis not present

## 2017-06-20 DIAGNOSIS — M1990 Unspecified osteoarthritis, unspecified site: Secondary | ICD-10-CM | POA: Diagnosis not present

## 2017-06-22 DIAGNOSIS — M1990 Unspecified osteoarthritis, unspecified site: Secondary | ICD-10-CM | POA: Diagnosis not present

## 2017-06-22 DIAGNOSIS — I10 Essential (primary) hypertension: Secondary | ICD-10-CM | POA: Diagnosis not present

## 2017-06-22 DIAGNOSIS — E119 Type 2 diabetes mellitus without complications: Secondary | ICD-10-CM | POA: Diagnosis not present

## 2017-06-22 DIAGNOSIS — L89893 Pressure ulcer of other site, stage 3: Secondary | ICD-10-CM | POA: Diagnosis not present

## 2017-06-22 DIAGNOSIS — Z435 Encounter for attention to cystostomy: Secondary | ICD-10-CM | POA: Diagnosis not present

## 2017-06-22 DIAGNOSIS — G35 Multiple sclerosis: Secondary | ICD-10-CM | POA: Diagnosis not present

## 2017-06-22 MED ORDER — OXYCODONE-ACETAMINOPHEN 10-325 MG PO TABS: 1.0000 | ORAL_TABLET | Freq: Three times a day (TID) | ORAL | 0 refills | Status: DC | PRN

## 2017-06-22 MED ORDER — OXYCODONE HCL ER 20 MG PO T12A: 20.0000 mg | EXTENDED_RELEASE_TABLET | Freq: Three times a day (TID) | ORAL | 0 refills | Status: DC | PRN

## 2017-06-22 NOTE — Telephone Encounter (Signed)
Refill done. Please notify pt.

## 2017-06-24 DIAGNOSIS — L89312 Pressure ulcer of right buttock, stage 2: Secondary | ICD-10-CM | POA: Diagnosis not present

## 2017-06-24 DIAGNOSIS — I10 Essential (primary) hypertension: Secondary | ICD-10-CM | POA: Diagnosis not present

## 2017-06-24 DIAGNOSIS — G35 Multiple sclerosis: Secondary | ICD-10-CM | POA: Diagnosis not present

## 2017-06-24 DIAGNOSIS — E1140 Type 2 diabetes mellitus with diabetic neuropathy, unspecified: Secondary | ICD-10-CM | POA: Diagnosis not present

## 2017-06-26 DIAGNOSIS — E119 Type 2 diabetes mellitus without complications: Secondary | ICD-10-CM | POA: Diagnosis not present

## 2017-06-26 DIAGNOSIS — I10 Essential (primary) hypertension: Secondary | ICD-10-CM | POA: Diagnosis not present

## 2017-06-26 DIAGNOSIS — Z435 Encounter for attention to cystostomy: Secondary | ICD-10-CM | POA: Diagnosis not present

## 2017-06-26 DIAGNOSIS — L89893 Pressure ulcer of other site, stage 3: Secondary | ICD-10-CM | POA: Diagnosis not present

## 2017-06-26 DIAGNOSIS — G35 Multiple sclerosis: Secondary | ICD-10-CM | POA: Diagnosis not present

## 2017-06-26 DIAGNOSIS — M1990 Unspecified osteoarthritis, unspecified site: Secondary | ICD-10-CM | POA: Diagnosis not present

## 2017-06-29 DIAGNOSIS — Z435 Encounter for attention to cystostomy: Secondary | ICD-10-CM | POA: Diagnosis not present

## 2017-06-29 DIAGNOSIS — G35 Multiple sclerosis: Secondary | ICD-10-CM | POA: Diagnosis not present

## 2017-06-29 DIAGNOSIS — E119 Type 2 diabetes mellitus without complications: Secondary | ICD-10-CM | POA: Diagnosis not present

## 2017-06-29 DIAGNOSIS — L89893 Pressure ulcer of other site, stage 3: Secondary | ICD-10-CM | POA: Diagnosis not present

## 2017-06-29 DIAGNOSIS — I10 Essential (primary) hypertension: Secondary | ICD-10-CM | POA: Diagnosis not present

## 2017-06-29 DIAGNOSIS — M1990 Unspecified osteoarthritis, unspecified site: Secondary | ICD-10-CM | POA: Diagnosis not present

## 2017-07-01 DIAGNOSIS — Z435 Encounter for attention to cystostomy: Secondary | ICD-10-CM | POA: Diagnosis not present

## 2017-07-01 DIAGNOSIS — G35 Multiple sclerosis: Secondary | ICD-10-CM | POA: Diagnosis not present

## 2017-07-01 DIAGNOSIS — L89893 Pressure ulcer of other site, stage 3: Secondary | ICD-10-CM | POA: Diagnosis not present

## 2017-07-01 DIAGNOSIS — E119 Type 2 diabetes mellitus without complications: Secondary | ICD-10-CM | POA: Diagnosis not present

## 2017-07-01 DIAGNOSIS — M1990 Unspecified osteoarthritis, unspecified site: Secondary | ICD-10-CM | POA: Diagnosis not present

## 2017-07-01 DIAGNOSIS — I10 Essential (primary) hypertension: Secondary | ICD-10-CM | POA: Diagnosis not present

## 2017-07-04 ENCOUNTER — Telehealth: Payer: Self-pay | Admitting: Podiatry

## 2017-07-04 NOTE — Telephone Encounter (Signed)
Called pt to schedule an appt to pick up diabetic shoes and pt asked if she could call back to schedule it.

## 2017-07-06 ENCOUNTER — Other Ambulatory Visit: Payer: Self-pay | Admitting: Family Medicine

## 2017-07-06 DIAGNOSIS — I10 Essential (primary) hypertension: Secondary | ICD-10-CM | POA: Diagnosis not present

## 2017-07-06 DIAGNOSIS — Z435 Encounter for attention to cystostomy: Secondary | ICD-10-CM | POA: Diagnosis not present

## 2017-07-06 DIAGNOSIS — M1990 Unspecified osteoarthritis, unspecified site: Secondary | ICD-10-CM | POA: Diagnosis not present

## 2017-07-06 DIAGNOSIS — G35 Multiple sclerosis: Secondary | ICD-10-CM | POA: Diagnosis not present

## 2017-07-06 DIAGNOSIS — L89893 Pressure ulcer of other site, stage 3: Secondary | ICD-10-CM | POA: Diagnosis not present

## 2017-07-06 DIAGNOSIS — E119 Type 2 diabetes mellitus without complications: Secondary | ICD-10-CM | POA: Diagnosis not present

## 2017-07-07 NOTE — Telephone Encounter (Signed)
Please advise on refill of Baclofen

## 2017-07-08 ENCOUNTER — Encounter (HOSPITAL_BASED_OUTPATIENT_CLINIC_OR_DEPARTMENT_OTHER): Payer: Medicare Other | Attending: Internal Medicine

## 2017-07-08 DIAGNOSIS — E1140 Type 2 diabetes mellitus with diabetic neuropathy, unspecified: Secondary | ICD-10-CM | POA: Diagnosis not present

## 2017-07-08 DIAGNOSIS — T25221A Burn of second degree of right foot, initial encounter: Secondary | ICD-10-CM | POA: Insufficient documentation

## 2017-07-08 DIAGNOSIS — G35 Multiple sclerosis: Secondary | ICD-10-CM | POA: Insufficient documentation

## 2017-07-08 DIAGNOSIS — X101XXA Contact with hot food, initial encounter: Secondary | ICD-10-CM | POA: Insufficient documentation

## 2017-07-08 DIAGNOSIS — I10 Essential (primary) hypertension: Secondary | ICD-10-CM | POA: Diagnosis not present

## 2017-07-08 DIAGNOSIS — L89313 Pressure ulcer of right buttock, stage 3: Secondary | ICD-10-CM | POA: Insufficient documentation

## 2017-07-12 DIAGNOSIS — N318 Other neuromuscular dysfunction of bladder: Secondary | ICD-10-CM | POA: Diagnosis not present

## 2017-07-13 DIAGNOSIS — G35 Multiple sclerosis: Secondary | ICD-10-CM | POA: Diagnosis not present

## 2017-07-13 DIAGNOSIS — M1990 Unspecified osteoarthritis, unspecified site: Secondary | ICD-10-CM | POA: Diagnosis not present

## 2017-07-13 DIAGNOSIS — E119 Type 2 diabetes mellitus without complications: Secondary | ICD-10-CM | POA: Diagnosis not present

## 2017-07-13 DIAGNOSIS — L89893 Pressure ulcer of other site, stage 3: Secondary | ICD-10-CM | POA: Diagnosis not present

## 2017-07-13 DIAGNOSIS — Z435 Encounter for attention to cystostomy: Secondary | ICD-10-CM | POA: Diagnosis not present

## 2017-07-13 DIAGNOSIS — I10 Essential (primary) hypertension: Secondary | ICD-10-CM | POA: Diagnosis not present

## 2017-07-15 DIAGNOSIS — Z435 Encounter for attention to cystostomy: Secondary | ICD-10-CM | POA: Diagnosis not present

## 2017-07-15 DIAGNOSIS — G35 Multiple sclerosis: Secondary | ICD-10-CM | POA: Diagnosis not present

## 2017-07-15 DIAGNOSIS — M1990 Unspecified osteoarthritis, unspecified site: Secondary | ICD-10-CM | POA: Diagnosis not present

## 2017-07-15 DIAGNOSIS — E119 Type 2 diabetes mellitus without complications: Secondary | ICD-10-CM | POA: Diagnosis not present

## 2017-07-15 DIAGNOSIS — I10 Essential (primary) hypertension: Secondary | ICD-10-CM | POA: Diagnosis not present

## 2017-07-15 DIAGNOSIS — L89893 Pressure ulcer of other site, stage 3: Secondary | ICD-10-CM | POA: Diagnosis not present

## 2017-07-20 DIAGNOSIS — I10 Essential (primary) hypertension: Secondary | ICD-10-CM | POA: Diagnosis not present

## 2017-07-20 DIAGNOSIS — E119 Type 2 diabetes mellitus without complications: Secondary | ICD-10-CM | POA: Diagnosis not present

## 2017-07-20 DIAGNOSIS — G35 Multiple sclerosis: Secondary | ICD-10-CM | POA: Diagnosis not present

## 2017-07-20 DIAGNOSIS — M1990 Unspecified osteoarthritis, unspecified site: Secondary | ICD-10-CM | POA: Diagnosis not present

## 2017-07-20 DIAGNOSIS — Z435 Encounter for attention to cystostomy: Secondary | ICD-10-CM | POA: Diagnosis not present

## 2017-07-20 DIAGNOSIS — L89893 Pressure ulcer of other site, stage 3: Secondary | ICD-10-CM | POA: Diagnosis not present

## 2017-07-22 DIAGNOSIS — G35 Multiple sclerosis: Secondary | ICD-10-CM | POA: Diagnosis not present

## 2017-07-22 DIAGNOSIS — I10 Essential (primary) hypertension: Secondary | ICD-10-CM | POA: Diagnosis not present

## 2017-07-22 DIAGNOSIS — E1140 Type 2 diabetes mellitus with diabetic neuropathy, unspecified: Secondary | ICD-10-CM | POA: Diagnosis not present

## 2017-07-22 DIAGNOSIS — T25221A Burn of second degree of right foot, initial encounter: Secondary | ICD-10-CM | POA: Diagnosis not present

## 2017-07-22 DIAGNOSIS — L89313 Pressure ulcer of right buttock, stage 3: Secondary | ICD-10-CM | POA: Diagnosis not present

## 2017-07-27 DIAGNOSIS — L89893 Pressure ulcer of other site, stage 3: Secondary | ICD-10-CM | POA: Diagnosis not present

## 2017-07-27 DIAGNOSIS — Z435 Encounter for attention to cystostomy: Secondary | ICD-10-CM | POA: Diagnosis not present

## 2017-07-27 DIAGNOSIS — E119 Type 2 diabetes mellitus without complications: Secondary | ICD-10-CM | POA: Diagnosis not present

## 2017-07-27 DIAGNOSIS — M1990 Unspecified osteoarthritis, unspecified site: Secondary | ICD-10-CM | POA: Diagnosis not present

## 2017-07-27 DIAGNOSIS — G35 Multiple sclerosis: Secondary | ICD-10-CM | POA: Diagnosis not present

## 2017-07-27 DIAGNOSIS — I10 Essential (primary) hypertension: Secondary | ICD-10-CM | POA: Diagnosis not present

## 2017-07-30 DIAGNOSIS — G35 Multiple sclerosis: Secondary | ICD-10-CM | POA: Diagnosis not present

## 2017-07-30 DIAGNOSIS — Z435 Encounter for attention to cystostomy: Secondary | ICD-10-CM | POA: Diagnosis not present

## 2017-07-30 DIAGNOSIS — I10 Essential (primary) hypertension: Secondary | ICD-10-CM | POA: Diagnosis not present

## 2017-07-30 DIAGNOSIS — E119 Type 2 diabetes mellitus without complications: Secondary | ICD-10-CM | POA: Diagnosis not present

## 2017-07-30 DIAGNOSIS — M1990 Unspecified osteoarthritis, unspecified site: Secondary | ICD-10-CM | POA: Diagnosis not present

## 2017-07-30 DIAGNOSIS — L89893 Pressure ulcer of other site, stage 3: Secondary | ICD-10-CM | POA: Diagnosis not present

## 2017-08-03 DIAGNOSIS — E119 Type 2 diabetes mellitus without complications: Secondary | ICD-10-CM | POA: Diagnosis not present

## 2017-08-03 DIAGNOSIS — N318 Other neuromuscular dysfunction of bladder: Secondary | ICD-10-CM | POA: Diagnosis not present

## 2017-08-03 DIAGNOSIS — L89893 Pressure ulcer of other site, stage 3: Secondary | ICD-10-CM | POA: Diagnosis not present

## 2017-08-03 DIAGNOSIS — Z435 Encounter for attention to cystostomy: Secondary | ICD-10-CM | POA: Diagnosis not present

## 2017-08-03 DIAGNOSIS — M1990 Unspecified osteoarthritis, unspecified site: Secondary | ICD-10-CM | POA: Diagnosis not present

## 2017-08-03 DIAGNOSIS — G35 Multiple sclerosis: Secondary | ICD-10-CM | POA: Diagnosis not present

## 2017-08-03 DIAGNOSIS — I10 Essential (primary) hypertension: Secondary | ICD-10-CM | POA: Diagnosis not present

## 2017-08-05 DIAGNOSIS — I10 Essential (primary) hypertension: Secondary | ICD-10-CM | POA: Diagnosis not present

## 2017-08-05 DIAGNOSIS — T25221A Burn of second degree of right foot, initial encounter: Secondary | ICD-10-CM | POA: Diagnosis not present

## 2017-08-05 DIAGNOSIS — G35 Multiple sclerosis: Secondary | ICD-10-CM | POA: Diagnosis not present

## 2017-08-05 DIAGNOSIS — L89313 Pressure ulcer of right buttock, stage 3: Secondary | ICD-10-CM | POA: Diagnosis not present

## 2017-08-05 DIAGNOSIS — E1140 Type 2 diabetes mellitus with diabetic neuropathy, unspecified: Secondary | ICD-10-CM | POA: Diagnosis not present

## 2017-08-10 ENCOUNTER — Ambulatory Visit: Payer: Medicare Other | Admitting: Family Medicine

## 2017-08-10 DIAGNOSIS — M1990 Unspecified osteoarthritis, unspecified site: Secondary | ICD-10-CM | POA: Diagnosis not present

## 2017-08-10 DIAGNOSIS — Z435 Encounter for attention to cystostomy: Secondary | ICD-10-CM | POA: Diagnosis not present

## 2017-08-10 DIAGNOSIS — E119 Type 2 diabetes mellitus without complications: Secondary | ICD-10-CM | POA: Diagnosis not present

## 2017-08-10 DIAGNOSIS — L89893 Pressure ulcer of other site, stage 3: Secondary | ICD-10-CM | POA: Diagnosis not present

## 2017-08-10 DIAGNOSIS — G35 Multiple sclerosis: Secondary | ICD-10-CM | POA: Diagnosis not present

## 2017-08-10 DIAGNOSIS — I10 Essential (primary) hypertension: Secondary | ICD-10-CM | POA: Diagnosis not present

## 2017-08-11 DIAGNOSIS — M1990 Unspecified osteoarthritis, unspecified site: Secondary | ICD-10-CM | POA: Diagnosis not present

## 2017-08-11 DIAGNOSIS — Z435 Encounter for attention to cystostomy: Secondary | ICD-10-CM | POA: Diagnosis not present

## 2017-08-11 DIAGNOSIS — I10 Essential (primary) hypertension: Secondary | ICD-10-CM | POA: Diagnosis not present

## 2017-08-11 DIAGNOSIS — E119 Type 2 diabetes mellitus without complications: Secondary | ICD-10-CM | POA: Diagnosis not present

## 2017-08-11 DIAGNOSIS — L89893 Pressure ulcer of other site, stage 3: Secondary | ICD-10-CM | POA: Diagnosis not present

## 2017-08-11 DIAGNOSIS — G35 Multiple sclerosis: Secondary | ICD-10-CM | POA: Diagnosis not present

## 2017-08-12 DIAGNOSIS — G35 Multiple sclerosis: Secondary | ICD-10-CM | POA: Diagnosis not present

## 2017-08-12 DIAGNOSIS — L89893 Pressure ulcer of other site, stage 3: Secondary | ICD-10-CM | POA: Diagnosis not present

## 2017-08-12 DIAGNOSIS — M1990 Unspecified osteoarthritis, unspecified site: Secondary | ICD-10-CM | POA: Diagnosis not present

## 2017-08-12 DIAGNOSIS — E119 Type 2 diabetes mellitus without complications: Secondary | ICD-10-CM | POA: Diagnosis not present

## 2017-08-12 DIAGNOSIS — I10 Essential (primary) hypertension: Secondary | ICD-10-CM | POA: Diagnosis not present

## 2017-08-12 DIAGNOSIS — Z435 Encounter for attention to cystostomy: Secondary | ICD-10-CM | POA: Diagnosis not present

## 2017-08-13 DIAGNOSIS — E119 Type 2 diabetes mellitus without complications: Secondary | ICD-10-CM | POA: Diagnosis not present

## 2017-08-13 DIAGNOSIS — G35 Multiple sclerosis: Secondary | ICD-10-CM | POA: Diagnosis not present

## 2017-08-13 DIAGNOSIS — M1991 Primary osteoarthritis, unspecified site: Secondary | ICD-10-CM | POA: Diagnosis not present

## 2017-08-13 DIAGNOSIS — T25221D Burn of second degree of right foot, subsequent encounter: Secondary | ICD-10-CM | POA: Diagnosis not present

## 2017-08-13 DIAGNOSIS — L89313 Pressure ulcer of right buttock, stage 3: Secondary | ICD-10-CM | POA: Diagnosis not present

## 2017-08-13 DIAGNOSIS — I10 Essential (primary) hypertension: Secondary | ICD-10-CM | POA: Diagnosis not present

## 2017-08-17 ENCOUNTER — Encounter: Payer: Self-pay | Admitting: Family Medicine

## 2017-08-17 ENCOUNTER — Ambulatory Visit (INDEPENDENT_AMBULATORY_CARE_PROVIDER_SITE_OTHER): Payer: Medicare Other | Admitting: Family Medicine

## 2017-08-17 VITALS — BP 116/74 | HR 85 | Temp 98.1°F | Resp 18 | Ht 67.0 in | Wt 200.0 lb

## 2017-08-17 DIAGNOSIS — K5901 Slow transit constipation: Secondary | ICD-10-CM | POA: Diagnosis not present

## 2017-08-17 DIAGNOSIS — I10 Essential (primary) hypertension: Secondary | ICD-10-CM | POA: Diagnosis not present

## 2017-08-17 DIAGNOSIS — Z79891 Long term (current) use of opiate analgesic: Secondary | ICD-10-CM | POA: Diagnosis not present

## 2017-08-17 DIAGNOSIS — Z124 Encounter for screening for malignant neoplasm of cervix: Secondary | ICD-10-CM | POA: Diagnosis not present

## 2017-08-17 DIAGNOSIS — T25221D Burn of second degree of right foot, subsequent encounter: Secondary | ICD-10-CM | POA: Diagnosis not present

## 2017-08-17 DIAGNOSIS — Z0289 Encounter for other administrative examinations: Secondary | ICD-10-CM

## 2017-08-17 DIAGNOSIS — M1991 Primary osteoarthritis, unspecified site: Secondary | ICD-10-CM | POA: Diagnosis not present

## 2017-08-17 DIAGNOSIS — E1142 Type 2 diabetes mellitus with diabetic polyneuropathy: Secondary | ICD-10-CM | POA: Diagnosis not present

## 2017-08-17 DIAGNOSIS — L89313 Pressure ulcer of right buttock, stage 3: Secondary | ICD-10-CM | POA: Diagnosis not present

## 2017-08-17 DIAGNOSIS — R5381 Other malaise: Secondary | ICD-10-CM | POA: Diagnosis not present

## 2017-08-17 DIAGNOSIS — G35 Multiple sclerosis: Secondary | ICD-10-CM

## 2017-08-17 DIAGNOSIS — E782 Mixed hyperlipidemia: Secondary | ICD-10-CM | POA: Diagnosis not present

## 2017-08-17 DIAGNOSIS — Z1211 Encounter for screening for malignant neoplasm of colon: Secondary | ICD-10-CM

## 2017-08-17 DIAGNOSIS — E119 Type 2 diabetes mellitus without complications: Secondary | ICD-10-CM | POA: Diagnosis not present

## 2017-08-17 MED ORDER — OXYCODONE HCL ER 20 MG PO T12A: 20.0000 mg | EXTENDED_RELEASE_TABLET | Freq: Three times a day (TID) | ORAL | 0 refills | Status: DC | PRN

## 2017-08-17 MED ORDER — FLUCONAZOLE 150 MG PO TABS: 150.0000 mg | ORAL_TABLET | Freq: Every day | ORAL | 0 refills | Status: DC

## 2017-08-17 MED ORDER — HYDROCORTISONE-ACETIC ACID 1-2 % OT SOLN: 3.0000 [drp] | Freq: Four times a day (QID) | OTIC | 0 refills | Status: DC

## 2017-08-17 MED ORDER — OXYCODONE-ACETAMINOPHEN 10-325 MG PO TABS: 1.0000 | ORAL_TABLET | Freq: Three times a day (TID) | ORAL | 0 refills | Status: DC | PRN

## 2017-08-17 NOTE — Patient Instructions (Signed)
     IF you received an x-ray today, you will receive an invoice from [facility]. Please contact [facility] at [phone] with questions or concerns regarding your invoice.   IF you received labwork today, you will receive an invoice from [facility]. Please contact [facility] at [phone] with questions or concerns regarding your invoice.   Our billing staff will not be able to assist you with questions regarding bills from these companies.  You will be contacted with the lab results as soon as they are available. The fastest way to get your results is to activate your My Chart account. Instructions are located on the last page of this paperwork. If you have not heard from us regarding the results in 2 weeks, please contact this office.    We recommend that you schedule a mammogram for breast cancer screening. Typically, you do not need a referral to do this. Please contact a local imaging center to schedule your mammogram.   Hospital - [phone]  *ask for the Radiology Department [facility]) - [phone] or [phone]  [facility] - [phone] [facility] - [phone] [facility]  - [phone]  *ask for the Radiology Department [facility] - [phone]  *ask for the Radiology Department [facility] - [phone]  *ask for the Mammography Department [facility] - [phone] 
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
## 2017-08-18 LAB — COMPREHENSIVE METABOLIC PANEL
A/G RATIO: 1.8 (ref 1.2–2.2)
ALT: 8 IU/L (ref 0–32)
AST: 13 IU/L (ref 0–40)
Albumin: 4.4 g/dL (ref 3.5–5.5)
Alkaline Phosphatase: 62 IU/L (ref 39–117)
BUN/Creatinine Ratio: 13 (ref 9–23)
BUN: 10 mg/dL (ref 6–24)
CALCIUM: 10.1 mg/dL (ref 8.7–10.2)
CHLORIDE: 101 mmol/L (ref 96–106)
CO2: 26 mmol/L (ref 20–29)
Creatinine, Ser: 0.77 mg/dL (ref 0.57–1.00)
GFR, EST AFRICAN AMERICAN: 99 mL/min/{1.73_m2} (ref >59)
GFR, EST NON AFRICAN AMERICAN: 86 mL/min/{1.73_m2} (ref >59)
GLOBULIN, TOTAL: 2.5 g/dL (ref 1.5–4.5)
Glucose: 84 mg/dL (ref 65–99)
POTASSIUM: 4.1 mmol/L (ref 3.5–5.2)
SODIUM: 142 mmol/L (ref 134–144)
TOTAL PROTEIN: 6.9 g/dL (ref 6.0–8.5)

## 2017-08-18 LAB — LIPID PANEL
CHOL/HDL RATIO: 2.2 ratio (ref 0.0–4.4)
CHOLESTEROL TOTAL: 130 mg/dL (ref 100–199)
HDL: 59 mg/dL (ref >39)
LDL CALC: 56 mg/dL (ref 0–99)
TRIGLYCERIDES: 76 mg/dL (ref 0–149)
VLDL Cholesterol Cal: 15 mg/dL (ref 5–40)

## 2017-08-24 DIAGNOSIS — L89313 Pressure ulcer of right buttock, stage 3: Secondary | ICD-10-CM | POA: Diagnosis not present

## 2017-08-24 DIAGNOSIS — I10 Essential (primary) hypertension: Secondary | ICD-10-CM | POA: Diagnosis not present

## 2017-08-24 DIAGNOSIS — G35 Multiple sclerosis: Secondary | ICD-10-CM | POA: Diagnosis not present

## 2017-08-24 DIAGNOSIS — M1991 Primary osteoarthritis, unspecified site: Secondary | ICD-10-CM | POA: Diagnosis not present

## 2017-08-24 DIAGNOSIS — T25221D Burn of second degree of right foot, subsequent encounter: Secondary | ICD-10-CM | POA: Diagnosis not present

## 2017-08-24 DIAGNOSIS — E119 Type 2 diabetes mellitus without complications: Secondary | ICD-10-CM | POA: Diagnosis not present

## 2017-08-24 NOTE — Assessment & Plan Note (Signed)
Improved quality of life with pain mgmt. Pt is stable on current doses

## 2017-08-24 NOTE — Assessment & Plan Note (Signed)
Continue Neurology follow up

## 2017-08-24 NOTE — Assessment & Plan Note (Signed)
Continue movantik for Opioid induced constipation

## 2017-08-24 NOTE — Assessment & Plan Note (Signed)
Continue pain meds and diabetes mgmt

## 2017-08-24 NOTE — Assessment & Plan Note (Signed)
Due to mobility concerns pt was advised to evacuate her home and stay with her daughter. Discussed personal safety.

## 2017-08-24 NOTE — Assessment & Plan Note (Signed)
bp stable on current medications

## 2017-08-25 DIAGNOSIS — N318 Other neuromuscular dysfunction of bladder: Secondary | ICD-10-CM | POA: Diagnosis not present

## 2017-08-26 ENCOUNTER — Encounter (HOSPITAL_BASED_OUTPATIENT_CLINIC_OR_DEPARTMENT_OTHER): Payer: Medicare Other | Attending: Internal Medicine

## 2017-08-26 DIAGNOSIS — L89312 Pressure ulcer of right buttock, stage 2: Secondary | ICD-10-CM | POA: Insufficient documentation

## 2017-08-26 DIAGNOSIS — G35 Multiple sclerosis: Secondary | ICD-10-CM | POA: Insufficient documentation

## 2017-08-26 DIAGNOSIS — L97512 Non-pressure chronic ulcer of other part of right foot with fat layer exposed: Secondary | ICD-10-CM | POA: Diagnosis not present

## 2017-08-26 DIAGNOSIS — E1140 Type 2 diabetes mellitus with diabetic neuropathy, unspecified: Secondary | ICD-10-CM | POA: Diagnosis not present

## 2017-08-26 DIAGNOSIS — I10 Essential (primary) hypertension: Secondary | ICD-10-CM | POA: Diagnosis not present

## 2017-08-26 DIAGNOSIS — L89313 Pressure ulcer of right buttock, stage 3: Secondary | ICD-10-CM | POA: Diagnosis not present

## 2017-08-26 DIAGNOSIS — T25221A Burn of second degree of right foot, initial encounter: Secondary | ICD-10-CM | POA: Diagnosis not present

## 2017-08-31 ENCOUNTER — Other Ambulatory Visit: Payer: Self-pay | Admitting: Family Medicine

## 2017-08-31 DIAGNOSIS — E119 Type 2 diabetes mellitus without complications: Secondary | ICD-10-CM | POA: Diagnosis not present

## 2017-08-31 DIAGNOSIS — L89313 Pressure ulcer of right buttock, stage 3: Secondary | ICD-10-CM | POA: Diagnosis not present

## 2017-08-31 DIAGNOSIS — T25221D Burn of second degree of right foot, subsequent encounter: Secondary | ICD-10-CM | POA: Diagnosis not present

## 2017-08-31 DIAGNOSIS — I10 Essential (primary) hypertension: Secondary | ICD-10-CM | POA: Diagnosis not present

## 2017-08-31 DIAGNOSIS — M1991 Primary osteoarthritis, unspecified site: Secondary | ICD-10-CM | POA: Diagnosis not present

## 2017-08-31 DIAGNOSIS — G35 Multiple sclerosis: Secondary | ICD-10-CM | POA: Diagnosis not present

## 2017-09-01 ENCOUNTER — Other Ambulatory Visit: Payer: Self-pay | Admitting: Family Medicine

## 2017-09-01 NOTE — Telephone Encounter (Signed)
Refill request for baclofen  20 mg #120 with 0 refills approved per dr Nolon Rod. Dgaddy, CMA

## 2017-09-02 DIAGNOSIS — E119 Type 2 diabetes mellitus without complications: Secondary | ICD-10-CM | POA: Diagnosis not present

## 2017-09-02 DIAGNOSIS — I10 Essential (primary) hypertension: Secondary | ICD-10-CM | POA: Diagnosis not present

## 2017-09-02 DIAGNOSIS — T25221D Burn of second degree of right foot, subsequent encounter: Secondary | ICD-10-CM | POA: Diagnosis not present

## 2017-09-02 DIAGNOSIS — L89313 Pressure ulcer of right buttock, stage 3: Secondary | ICD-10-CM | POA: Diagnosis not present

## 2017-09-02 DIAGNOSIS — G35 Multiple sclerosis: Secondary | ICD-10-CM | POA: Diagnosis not present

## 2017-09-02 DIAGNOSIS — M1991 Primary osteoarthritis, unspecified site: Secondary | ICD-10-CM | POA: Diagnosis not present

## 2017-09-03 DIAGNOSIS — M1991 Primary osteoarthritis, unspecified site: Secondary | ICD-10-CM | POA: Diagnosis not present

## 2017-09-03 DIAGNOSIS — L89313 Pressure ulcer of right buttock, stage 3: Secondary | ICD-10-CM | POA: Diagnosis not present

## 2017-09-03 DIAGNOSIS — G35 Multiple sclerosis: Secondary | ICD-10-CM | POA: Diagnosis not present

## 2017-09-03 DIAGNOSIS — E119 Type 2 diabetes mellitus without complications: Secondary | ICD-10-CM | POA: Diagnosis not present

## 2017-09-03 DIAGNOSIS — T25221D Burn of second degree of right foot, subsequent encounter: Secondary | ICD-10-CM | POA: Diagnosis not present

## 2017-09-03 DIAGNOSIS — I10 Essential (primary) hypertension: Secondary | ICD-10-CM | POA: Diagnosis not present

## 2017-09-07 DIAGNOSIS — E119 Type 2 diabetes mellitus without complications: Secondary | ICD-10-CM | POA: Diagnosis not present

## 2017-09-07 DIAGNOSIS — M1991 Primary osteoarthritis, unspecified site: Secondary | ICD-10-CM | POA: Diagnosis not present

## 2017-09-07 DIAGNOSIS — I10 Essential (primary) hypertension: Secondary | ICD-10-CM | POA: Diagnosis not present

## 2017-09-07 DIAGNOSIS — G35 Multiple sclerosis: Secondary | ICD-10-CM | POA: Diagnosis not present

## 2017-09-07 DIAGNOSIS — T25221D Burn of second degree of right foot, subsequent encounter: Secondary | ICD-10-CM | POA: Diagnosis not present

## 2017-09-07 DIAGNOSIS — L89313 Pressure ulcer of right buttock, stage 3: Secondary | ICD-10-CM | POA: Diagnosis not present

## 2017-09-08 ENCOUNTER — Telehealth: Payer: Self-pay

## 2017-09-08 NOTE — Telephone Encounter (Signed)
Spoke with patient and gave her the phone number for the [facility] so she can call them and setup appt for gynecology appt and help get referral setup for her with a GI physician.  Numbers given [phone] and/or [phone].  Pt appreciative an advised to call with any questions or concerns.

## 2017-09-09 DIAGNOSIS — G35 Multiple sclerosis: Secondary | ICD-10-CM | POA: Diagnosis not present

## 2017-09-09 DIAGNOSIS — T25221D Burn of second degree of right foot, subsequent encounter: Secondary | ICD-10-CM | POA: Diagnosis not present

## 2017-09-09 DIAGNOSIS — E119 Type 2 diabetes mellitus without complications: Secondary | ICD-10-CM | POA: Diagnosis not present

## 2017-09-09 DIAGNOSIS — L89313 Pressure ulcer of right buttock, stage 3: Secondary | ICD-10-CM | POA: Diagnosis not present

## 2017-09-09 DIAGNOSIS — I10 Essential (primary) hypertension: Secondary | ICD-10-CM | POA: Diagnosis not present

## 2017-09-09 DIAGNOSIS — M1991 Primary osteoarthritis, unspecified site: Secondary | ICD-10-CM | POA: Diagnosis not present

## 2017-09-14 DIAGNOSIS — E119 Type 2 diabetes mellitus without complications: Secondary | ICD-10-CM | POA: Diagnosis not present

## 2017-09-14 DIAGNOSIS — I10 Essential (primary) hypertension: Secondary | ICD-10-CM | POA: Diagnosis not present

## 2017-09-14 DIAGNOSIS — M1991 Primary osteoarthritis, unspecified site: Secondary | ICD-10-CM | POA: Diagnosis not present

## 2017-09-14 DIAGNOSIS — L89313 Pressure ulcer of right buttock, stage 3: Secondary | ICD-10-CM | POA: Diagnosis not present

## 2017-09-14 DIAGNOSIS — G35 Multiple sclerosis: Secondary | ICD-10-CM | POA: Diagnosis not present

## 2017-09-14 DIAGNOSIS — T25221D Burn of second degree of right foot, subsequent encounter: Secondary | ICD-10-CM | POA: Diagnosis not present

## 2017-09-15 DIAGNOSIS — N318 Other neuromuscular dysfunction of bladder: Secondary | ICD-10-CM | POA: Diagnosis not present

## 2017-09-16 ENCOUNTER — Encounter (HOSPITAL_BASED_OUTPATIENT_CLINIC_OR_DEPARTMENT_OTHER): Payer: Medicare Other | Attending: Internal Medicine

## 2017-09-16 DIAGNOSIS — L89312 Pressure ulcer of right buttock, stage 2: Secondary | ICD-10-CM | POA: Diagnosis not present

## 2017-09-16 DIAGNOSIS — L89313 Pressure ulcer of right buttock, stage 3: Secondary | ICD-10-CM | POA: Diagnosis not present

## 2017-09-16 DIAGNOSIS — E1140 Type 2 diabetes mellitus with diabetic neuropathy, unspecified: Secondary | ICD-10-CM | POA: Insufficient documentation

## 2017-09-16 DIAGNOSIS — G35 Multiple sclerosis: Secondary | ICD-10-CM | POA: Diagnosis not present

## 2017-09-16 DIAGNOSIS — I10 Essential (primary) hypertension: Secondary | ICD-10-CM | POA: Diagnosis not present

## 2017-09-16 DIAGNOSIS — T25221A Burn of second degree of right foot, initial encounter: Secondary | ICD-10-CM | POA: Diagnosis not present

## 2017-09-21 ENCOUNTER — Telehealth: Payer: Self-pay | Admitting: Family Medicine

## 2017-09-21 DIAGNOSIS — I10 Essential (primary) hypertension: Secondary | ICD-10-CM | POA: Diagnosis not present

## 2017-09-21 DIAGNOSIS — T25221D Burn of second degree of right foot, subsequent encounter: Secondary | ICD-10-CM | POA: Diagnosis not present

## 2017-09-21 DIAGNOSIS — M1991 Primary osteoarthritis, unspecified site: Secondary | ICD-10-CM | POA: Diagnosis not present

## 2017-09-21 DIAGNOSIS — G35 Multiple sclerosis: Secondary | ICD-10-CM | POA: Diagnosis not present

## 2017-09-21 DIAGNOSIS — E119 Type 2 diabetes mellitus without complications: Secondary | ICD-10-CM | POA: Diagnosis not present

## 2017-09-21 DIAGNOSIS — L89313 Pressure ulcer of right buttock, stage 3: Secondary | ICD-10-CM | POA: Diagnosis not present

## 2017-09-21 NOTE — Telephone Encounter (Signed)
[facility] - Pt needs recommendation for over-the-counter med or a prescription for her chronic constipation. [phone]

## 2017-09-26 ENCOUNTER — Telehealth: Payer: Self-pay | Admitting: Family Medicine

## 2017-09-26 ENCOUNTER — Telehealth: Payer: Self-pay | Admitting: Neurology

## 2017-09-26 NOTE — Telephone Encounter (Signed)
Please advise 

## 2017-09-26 NOTE — Telephone Encounter (Signed)
Pt called about this last Wednesday and still has not heard from [place]

## 2017-09-26 NOTE — Telephone Encounter (Signed)
Noted.  Pharmacy changed to [facility]

## 2017-09-26 NOTE — Telephone Encounter (Signed)
Patient calling to advise she has changed pharmacies. She now uses [facility]. A returned call is not needed unless there are questions.

## 2017-09-26 NOTE — Telephone Encounter (Signed)
[name] - Pt needs her pharmacy changed from now on to [facility] in [place].

## 2017-09-27 ENCOUNTER — Telehealth: Payer: Self-pay | Admitting: Neurology

## 2017-09-27 MED ORDER — BACLOFEN 20 MG PO TABS: 20.0000 mg | ORAL_TABLET | Freq: Four times a day (QID) | ORAL | 3 refills | Status: DC

## 2017-09-27 NOTE — Telephone Encounter (Signed)
Hassan Rowan from pharmacy has just called to inform pt has switched over to their pharmacy.  She is asking for all pt's prescriptions be sent over.  Please call

## 2017-09-27 NOTE — Telephone Encounter (Signed)
Please notify the patient that because of the opiates she could try a few prescription meds for patients on opiates. Let her know she should make an office visit.  If she is severely constipated she should come in asap.

## 2017-09-27 NOTE — Telephone Encounter (Signed)
Spoke with the pharmacist at [facility] and adivsed the only meds RAS rx's at this time are Betaseron and Baclofen.  The Betaseron comes from a specialty pharmacy.  Baclofen escribed to [facility]

## 2017-09-27 NOTE — Addendum Note (Signed)
Addended by: France Ravens I on: 09/27/2017 11:22 AM   Modules accepted: Orders

## 2017-09-29 ENCOUNTER — Other Ambulatory Visit: Payer: Self-pay | Admitting: Family Medicine

## 2017-09-29 NOTE — Telephone Encounter (Signed)
Spoke with patient this morning  about her request for medications for constipation, pt informed me that she has an appointment already scheduled and she will discuss this with Dr. Bridget Hartshorn at her visit.

## 2017-10-03 IMAGING — CR DG PELVIS 1-2V
3 series · 3 of 3 positions shown · non-contrast
Comparison: CT pelvis performed earlier this day post suprapubic
catheter contrast installation.

CLINICAL DATA: Suprapubic catheter dysfunction.

EXAM:
PELVIS - 1-2 VIEW

[x pelvis (1 of 3)]
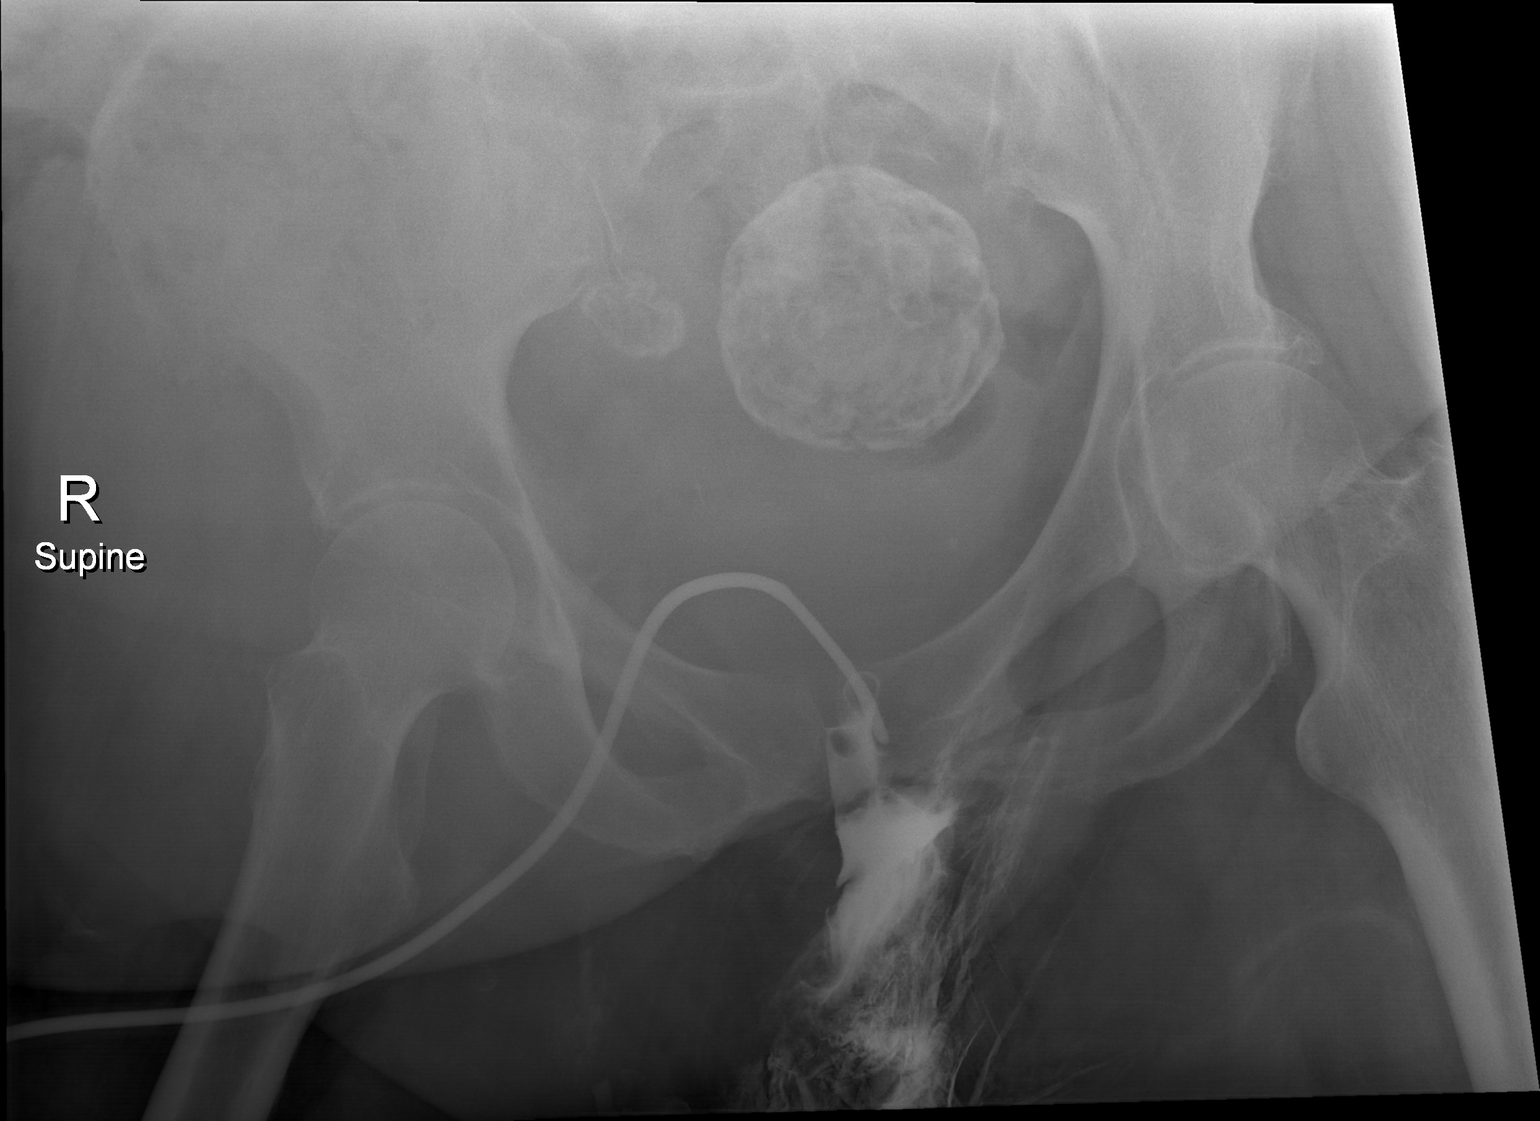

[x pelvis (2 of 3)]
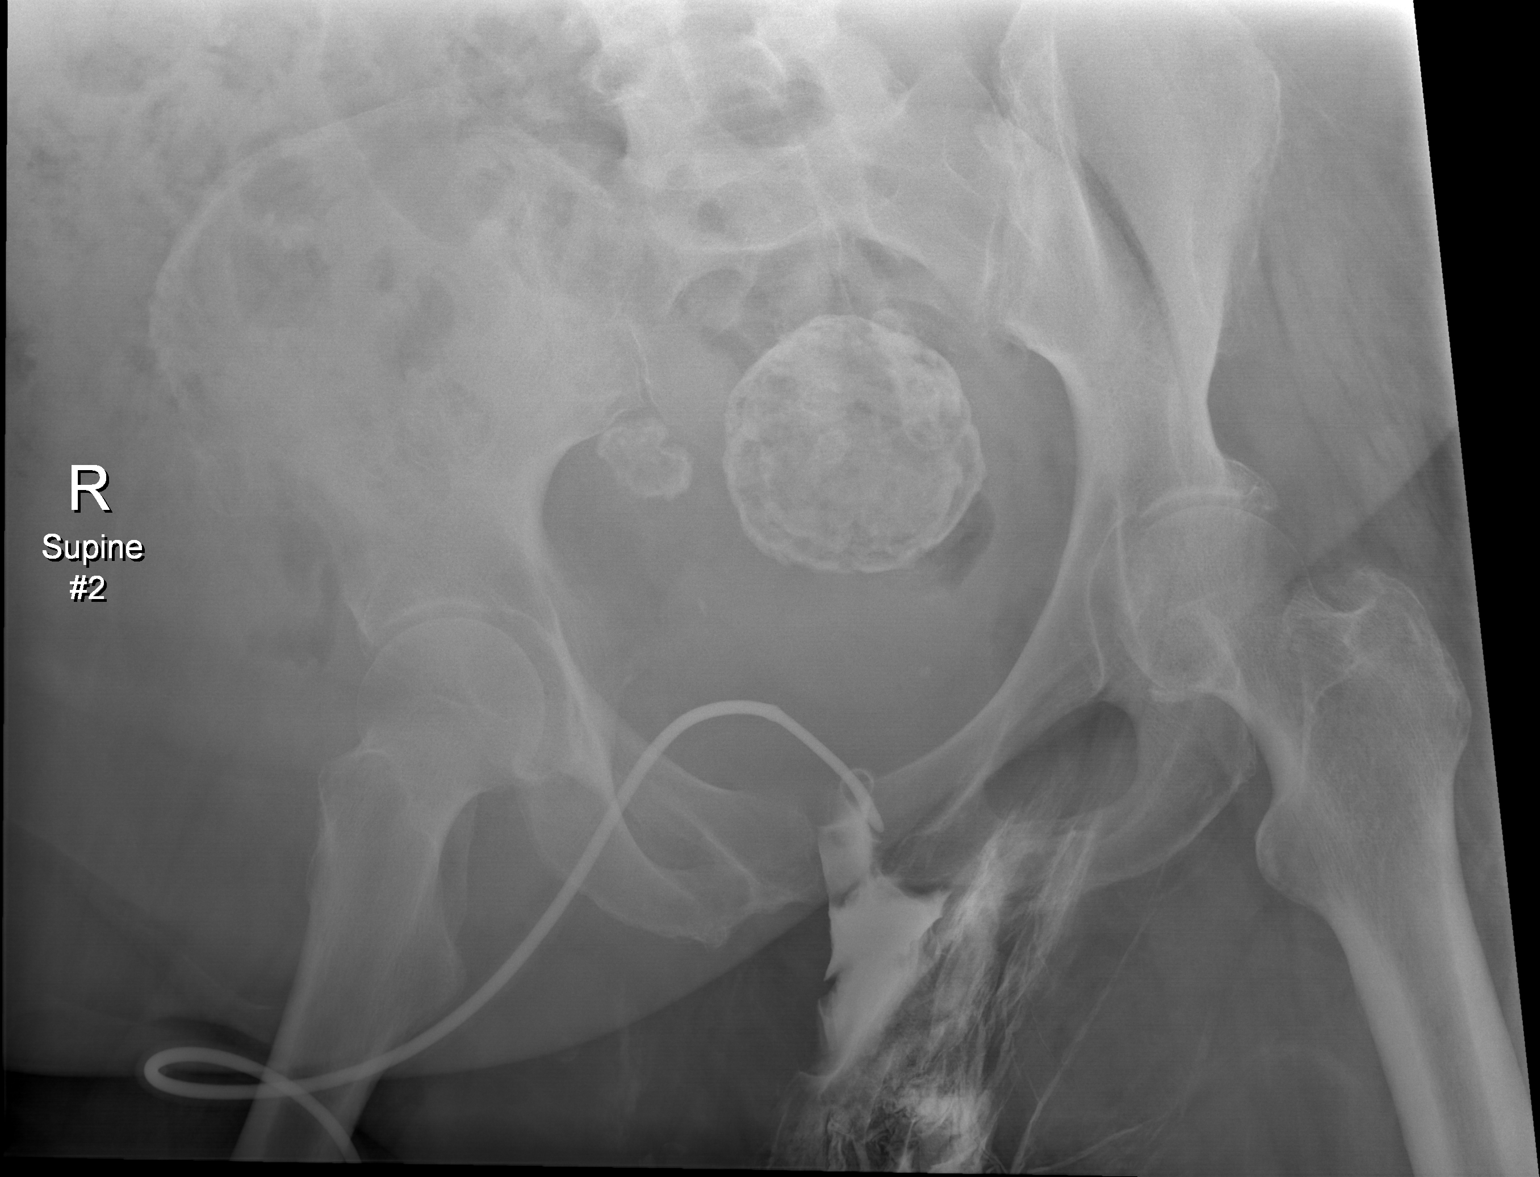

[x pelvis (3 of 3)]
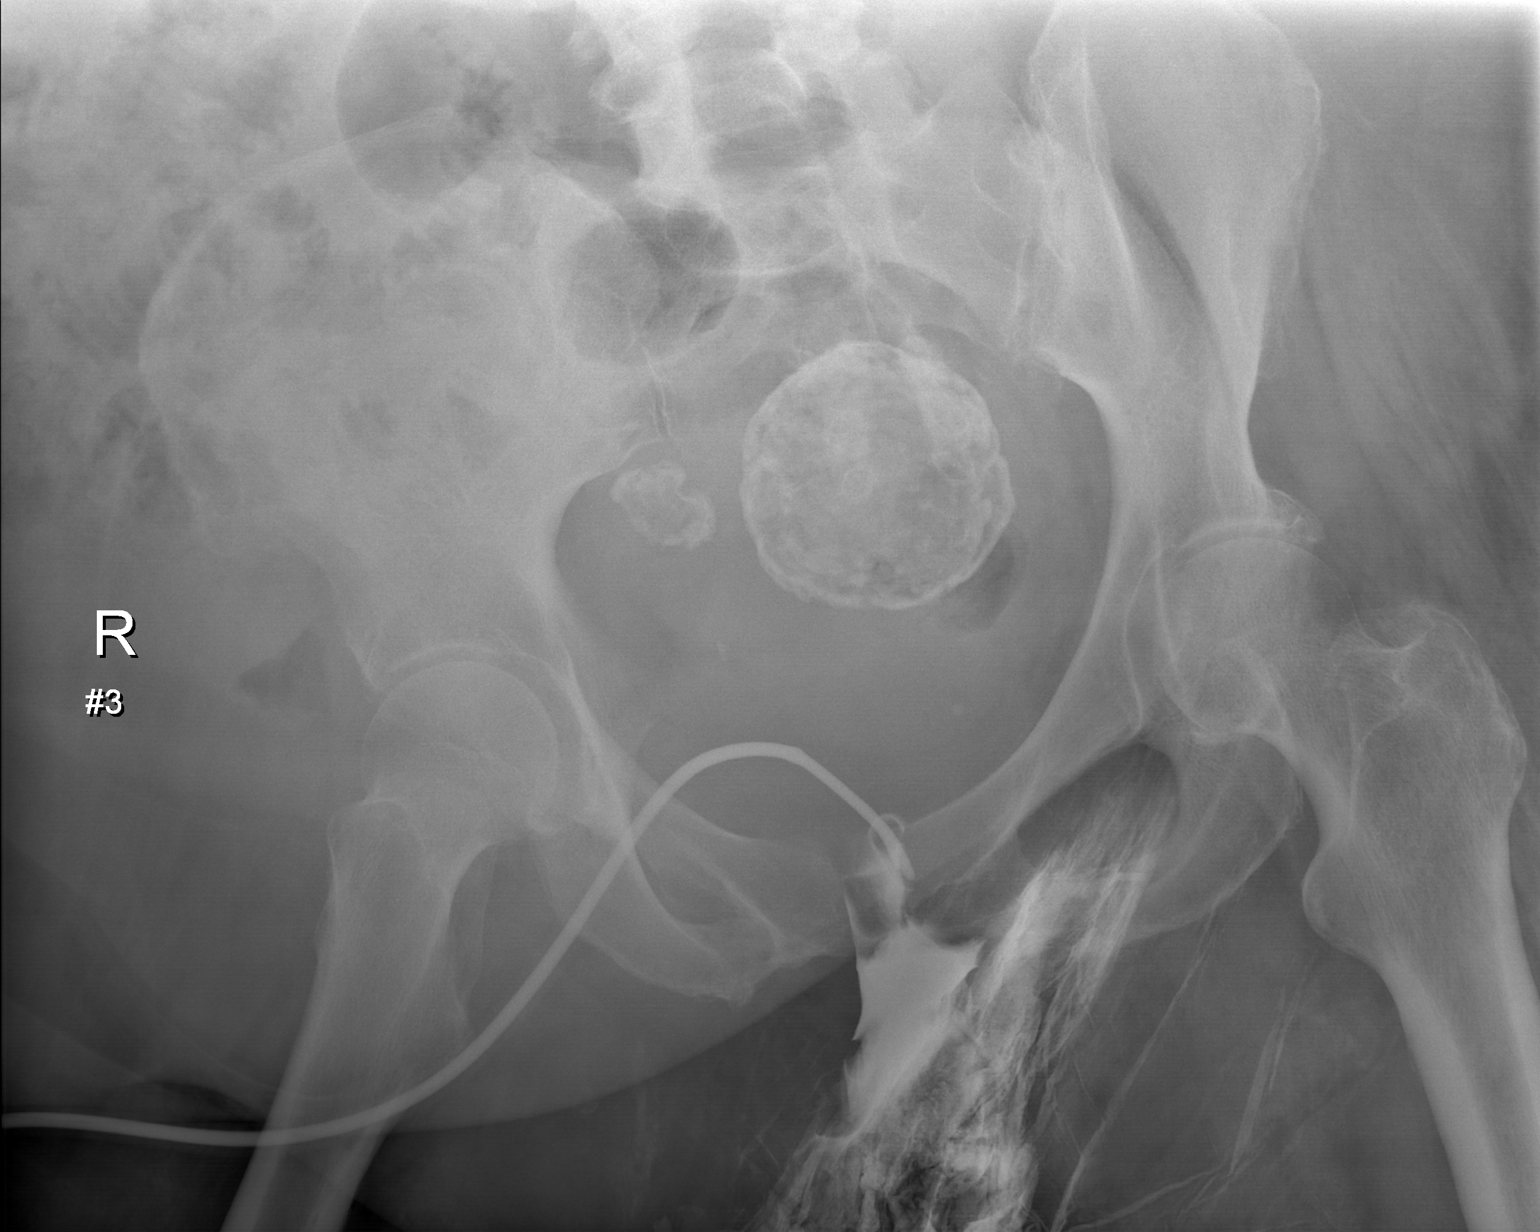

[3 of 3 positions shown; findings below may reference images not displayed]

FINDINGS: Three AP views of the pelvis obtained. Technologist notes state the
referring clinician repositioned of the catheter between these.
Calcified fibroids are seen.

Initial radiograph demonstrates suprapubic catheter tubing with
contrast in the vagina and perineum.

Radiograph labeled supine #2 demonstrates similar contrast in the
vagina and perineum.

Radiograph labeled supine #3 also demonstrates similar contrast in
the vagina and perineum.

Contrast does not opacify the bladder on any view.
IMPRESSION: Contrast instilled through suprapubic tube opacifies the tubing,
vagina and perineum. Tip of the catheter may be within the urethra
with vaginal reflux versus fistula.

## 2017-10-04 DIAGNOSIS — G35 Multiple sclerosis: Secondary | ICD-10-CM | POA: Diagnosis not present

## 2017-10-04 DIAGNOSIS — T25221D Burn of second degree of right foot, subsequent encounter: Secondary | ICD-10-CM | POA: Diagnosis not present

## 2017-10-04 DIAGNOSIS — I10 Essential (primary) hypertension: Secondary | ICD-10-CM | POA: Diagnosis not present

## 2017-10-04 DIAGNOSIS — M1991 Primary osteoarthritis, unspecified site: Secondary | ICD-10-CM | POA: Diagnosis not present

## 2017-10-04 DIAGNOSIS — L89313 Pressure ulcer of right buttock, stage 3: Secondary | ICD-10-CM | POA: Diagnosis not present

## 2017-10-04 DIAGNOSIS — E119 Type 2 diabetes mellitus without complications: Secondary | ICD-10-CM | POA: Diagnosis not present

## 2017-10-05 ENCOUNTER — Encounter: Payer: Self-pay | Admitting: Family Medicine

## 2017-10-05 ENCOUNTER — Ambulatory Visit: Payer: Medicare Other | Admitting: Family Medicine

## 2017-10-05 ENCOUNTER — Ambulatory Visit (INDEPENDENT_AMBULATORY_CARE_PROVIDER_SITE_OTHER): Payer: Medicare Other | Admitting: Family Medicine

## 2017-10-05 VITALS — BP 111/71 | HR 95 | Temp 98.6°F | Resp 16 | Ht 67.0 in | Wt 200.0 lb

## 2017-10-05 DIAGNOSIS — Z7409 Other reduced mobility: Secondary | ICD-10-CM

## 2017-10-05 DIAGNOSIS — G35 Multiple sclerosis: Secondary | ICD-10-CM

## 2017-10-05 DIAGNOSIS — E1142 Type 2 diabetes mellitus with diabetic polyneuropathy: Secondary | ICD-10-CM

## 2017-10-05 DIAGNOSIS — Z993 Dependence on wheelchair: Secondary | ICD-10-CM | POA: Diagnosis not present

## 2017-10-05 DIAGNOSIS — Z124 Encounter for screening for malignant neoplasm of cervix: Secondary | ICD-10-CM | POA: Diagnosis not present

## 2017-10-05 DIAGNOSIS — R5381 Other malaise: Secondary | ICD-10-CM

## 2017-10-05 MED ORDER — OXYCODONE HCL ER 20 MG PO T12A: 20.0000 mg | EXTENDED_RELEASE_TABLET | Freq: Three times a day (TID) | ORAL | 0 refills | Status: DC | PRN

## 2017-10-05 MED ORDER — OXYCODONE-ACETAMINOPHEN 10-325 MG PO TABS: 1.0000 | ORAL_TABLET | Freq: Three times a day (TID) | ORAL | 0 refills | Status: DC | PRN

## 2017-10-05 NOTE — Patient Instructions (Addendum)
We recommend that you schedule a mammogram for breast cancer screening. Typically, you do not need a referral to do this. Please contact a local imaging center to schedule your mammogram.  [facility] - [phone]  *ask for the Radiology Department [facility] - [phone] or [phone]  [facility] - [phone] [facility] - [phone] [facility] - [phone]  *ask for the Radiology Department [facility] - [phone]  *ask for the Radiology Department [facility] - [phone]  *ask for the Mammography Department [facility] - [phone]     IF you received an x-ray today, you will receive an invoice from [facility]. Please contact [facility] at [phone] with questions or concerns regarding your invoice.   IF you received labwork today, you will receive an invoice from [facility]. Please contact [facility] at [phone] with questions or concerns regarding your invoice.   Our billing staff will not be able to assist you with questions regarding bills from these companies.  You will be contacted with the lab results as soon as they are available. The fastest way to get your results is to activate your My Chart account. Instructions are located on the last page of this paperwork. If you have not heard from us regarding the results in 2 weeks, please contact this office.      

## 2017-10-05 NOTE — Progress Notes (Signed)
Chief Complaint  Patient presents with  . Constipation    HPI  Pt is here for pain med refill She reports that she feels like her neuropathic pain is better She takes it as scheduled She still struggles with constipation but has an appointment with GI  She would like a referral for pap smear from Gynecology She reports that she does not get one very often because she has such difficulty getting out of the chair She denies any history of abnormal pap smears  She is also requesting orders for a new lift to get her out of bed She is wheelchair bound and due to her body habitus her daughter has a hard time lifting her to help her out of the chair to bed and vice versa    Past Medical History:  Diagnosis Date  . Arthritis   . Decubitus ulcers   . Diabetes mellitus without complication ([place])   . Hypertension   . MS (multiple sclerosis) ([place])     Current Outpatient Medications  Medication Sig Dispense Refill  . Adhesive Tape (PAPER TAPE 1"X10YD) TAPE Apply to dressings as needed 4 each 6  . atorvastatin (LIPITOR) 20 MG tablet Take 20 mg by mouth daily.    . baclofen (LIORESAL) 20 MG tablet Take 1 tablet (20 mg total) by mouth 4 (four) times daily. 360 tablet 3  . Catheters (DOVER UNIVERSAL FOLEY TRAY) KIT Use foley tray kit to change catheter every 3 weeks or as needed 15 each 1  . Catheters (FOLEY CATHETER 2-WAY) MISC Use to aspirate urine suprapubic 15 each 1  . Cholecalciferol (D3-1000) 1000 units capsule Take 1,000 Units by mouth daily.    . cyanocobalamin 500 MCG tablet Take 500 mcg by mouth daily.    . Disposable Gloves MISC Use disposable gloves to change catheter as needed 200 each 11  . Exenatide ER 2 MG/0.85ML AUIJ Inject 2 mg into the skin.    . FORTAMET 1000 MG 24 hr tablet Take 1,000 mg by mouth 2 (two) times daily.  0  . furosemide (LASIX) 20 MG tablet Take 20 mg by mouth daily.    Marland Kitchen glipiZIDE (GLUCOTROL) 5 MG tablet Take 1 tablet (5 mg total) by mouth daily before  lunch. 90 tablet 3  . hydrochlorothiazide (HYDRODIURIL) 25 MG tablet Take 25 mg by mouth daily.    . Incontinence Supply Disposable (TENA FLEX 16 PLUS) MISC Use daily as needed 30 each 11  . Incontinence Supply Disposable (TENA PROTECT UNDERWEAR PLS/XLG) MISC Use for incontinence. 30 each 11  . Interferon Beta-1b (BETASERON) 0.3 MG KIT injection Inject 0.3 mg into the skin every other day.     . lidocaine (XYLOCAINE) 2 % jelly USE when need with dressings  1  . metFORMIN (GLUCOPHAGE) 1000 MG tablet Take 1,000 mg by mouth 2 (two) times daily with a meal.    . Methenamine-Sodium Salicylate (AZO URINARY TRACT DEFENSE) 162-162.5 MG TABS Take as instructed    . MOVANTIK 25 MG TABS tablet Take 25 mg by mouth every morning.  4  . NON FORMULARY Power wheelchair repairs  Diagnosis code z99.3 1 each 0  . NONFORMULARY OR COMPOUNDED ITEM [facility]:  Peripheral Neuropathy Cream - Bupivacaine 1%, Doxepin 3%, Gabapentin 6%, Pentoxifylline 3%, Topiramate 1%, apply 1-2 grams to affected area 3-4 times daily. 120 each 2  . nystatin ointment (MYCOSTATIN) apply to affected area(s) by topical route TWICE DAILY  2  . Ostomy Supplies (NEW IMAGE SKIN/FLANGE/TAPE) MISC 1 application  by Does not apply route daily as needed. 30 each 11  . oxyCODONE (OXYCONTIN) 20 mg 12 hr tablet Take 1 tablet (20 mg total) by mouth 3 (three) times daily as needed (pain). 90 tablet 0  . oxyCODONE-acetaminophen (PERCOCET) 10-325 MG tablet Take 1 tablet by mouth every 8 (eight) hours as needed for pain. 90 tablet 0  . potassium chloride (K-DUR,KLOR-CON) 10 MEQ tablet TAKE 3 TABLETS BY MOUTH TWICE DAILY 180 tablet 2  . Syringe, Disposable, (B-D SYRINGE LUER-LOK 30CC) 30 ML MISC Use to change catheter 15 each 0  . Wound Dressings (GRX HYDROGEL GAUZE 4X4) PADS Use gauze to clean wound daily as needed 200 each 11  . amoxicillin-clavulanate (AUGMENTIN) 875-125 MG tablet Take 1 tablet by mouth 2 (two) times daily. 20 tablet 0  . Control Gel  Formula Dressing (DUODERM CGF DRESSING) MISC Apply 1 each topically daily as needed. 5 each 11  . Hydroactive Dressings (DUODERM HYDROACTIVE) PSTE Apply to pressure wounds as needed 60 g 11  . lamoTRIgine (LAMICTAL) 100 MG tablet Take 1/2 pill po x 5 days, then 1/2 pill po bid x 5 days, then 1/2 pill po tid x 5 days then 1 pill po bid 60 tablet 11   No current facility-administered medications for this visit.     Allergies: No Known Allergies  Past Surgical History:  Procedure Laterality Date  . SUPRAPUBIC CATHETER INSERTION  2009    Social History   Socioeconomic History  . Marital status: Single    Spouse name: None  . Number of children: None  . Years of education: None  . Highest education level: None  Social Needs  . Financial resource strain: None  . Food insecurity - worry: None  . Food insecurity - inability: None  . Transportation needs - medical: None  . Transportation needs - non-medical: None  Occupational History  . None  Tobacco Use  . Smoking status: Never Smoker  . Smokeless tobacco: Never Used  Substance and Sexual Activity  . Alcohol use: No  . Drug use: No  . Sexual activity: None  Other Topics Concern  . None  Social History Narrative  . None    Family History  Problem Relation Age of Onset  . Cancer Mother   . Hypertension Mother   . Heart disease Father   . Diabetes Father   . Hypertension Sister   . Hypertension Brother      Review of Systems  Constitutional: Negative for chills, fever and weight loss.  Eyes: Negative for blurred vision and double vision.  Gastrointestinal: Positive for constipation. Negative for abdominal pain, diarrhea, nausea and vomiting.  Genitourinary: Negative for dysuria and urgency.       Indwelling foley catheter without any bladder pressure  Neurological: Negative for dizziness and headaches.   Review of Systems See HPI    Objective: Vitals:   10/05/17 1343  BP: 111/71  Pulse: 95  Resp: 16    Temp: 98.6 F (37 C)  TempSrc: Oral  SpO2: 99%  Weight: 200 lb (90.7 kg)  Height: _0  (1.702 m)    Physical Exam  Constitutional: She is oriented to person, place, and time. She appears well-developed and well-nourished.  HENT:  Head: Normocephalic and atraumatic.  Eyes: Conjunctivae and EOM are normal.  Cardiovascular: Normal rate, regular rhythm and normal heart sounds.  Pulmonary/Chest: Effort normal and breath sounds normal. No stridor. No respiratory distress.  Neurological: She is alert and oriented to person, place, and time.  Assessment and Plan Natanya was seen today for constipation.  Diagnoses and all orders for this visit:  Screening for cervical cancer -     Ambulatory referral to Gynecology  Multiple sclerosis ([place])- deconditioning making it difficult to do pap due to lack of metal speculum and body habitus -     Ambulatory referral to Gynecology  Does not transfer from chair to wheelchair -     Ambulatory referral to Gynecology  Physical deconditioning- continue with wound clinic for decubitus ulcers  Wheelchair bound- completed prescription for lift  Diabetic polyneuropathy associated with type 2 diabetes mellitus ([place])- chronic opiate prescription refilled  Other orders -     Cancel: Flu Vaccine QUAD 36+ mos IM -     Cancel: Tdap vaccine greater than or equal to 7yo IM -     Cancel: Ambulatory referral to Gastroenterology -     Cancel: Pneumococcal conjugate vaccine 13-valent IM -     oxyCODONE-acetaminophen (PERCOCET) 10-325 MG tablet; Take 1 tablet by mouth every 8 (eight) hours as needed for pain. -     oxyCODONE (OXYCONTIN) 20 mg 12 hr tablet; Take 1 tablet (20 mg total) by mouth 3 (three) times daily as needed (pain).     [place]

## 2017-10-06 ENCOUNTER — Encounter (HOSPITAL_BASED_OUTPATIENT_CLINIC_OR_DEPARTMENT_OTHER): Payer: Medicare Other | Attending: Internal Medicine

## 2017-10-06 DIAGNOSIS — N318 Other neuromuscular dysfunction of bladder: Secondary | ICD-10-CM | POA: Diagnosis not present

## 2017-10-06 DIAGNOSIS — L89313 Pressure ulcer of right buttock, stage 3: Secondary | ICD-10-CM | POA: Insufficient documentation

## 2017-10-06 DIAGNOSIS — E1140 Type 2 diabetes mellitus with diabetic neuropathy, unspecified: Secondary | ICD-10-CM | POA: Insufficient documentation

## 2017-10-06 DIAGNOSIS — I10 Essential (primary) hypertension: Secondary | ICD-10-CM | POA: Insufficient documentation

## 2017-10-06 DIAGNOSIS — G35 Multiple sclerosis: Secondary | ICD-10-CM | POA: Insufficient documentation

## 2017-10-07 ENCOUNTER — Ambulatory Visit (INDEPENDENT_AMBULATORY_CARE_PROVIDER_SITE_OTHER): Payer: Medicare Other

## 2017-10-07 VITALS — BP 126/80 | HR 86 | Temp 98.2°F | Ht 67.0 in | Wt 223.0 lb

## 2017-10-07 DIAGNOSIS — Z0000 Encounter for general adult medical examination without abnormal findings: Secondary | ICD-10-CM | POA: Diagnosis not present

## 2017-10-07 NOTE — Patient Instructions (Addendum)
[name] , Thank you for taking time to come for your Medicare Wellness Visit. I appreciate your ongoing commitment to your health goals. Please review the following plan we discussed and let me know if I can assist you in the future.   Screening recommendations/referrals: Colonoscopy: declined Mammogram: scheduled for December 2018 you stated Bone Density: starts at age 57 Recommended yearly ophthalmology/optometry visit for glaucoma screening and checkup Recommended yearly dental visit for hygiene and checkup  Vaccinations: Influenza vaccine: declined Pneumococcal vaccine: declined Tdap vaccine: declined due to insurance Shingles vaccine: declined     Advanced directives: Advance directive discussed with you today. I have provided a copy for you to complete at home and have notarized. Once this is complete please bring a copy in to our office so we can scan it into your chart.   Conditions/risks identified: You would like to try to stand on your own again one day.   Next appointment: 11/16/17 @ 2:20 pm with Dr. Nolon Rod  Preventive Care 40-64 Years, Female Preventive care refers to lifestyle choices and visits with your health care provider that can promote health and wellness. What does preventive care include?  A yearly physical exam. This is also called an annual well check.  Dental exams once or twice a year.  Routine eye exams. Ask your health care provider how often you should have your eyes checked.  Personal lifestyle choices, including:  Daily care of your teeth and gums.  Regular physical activity.  Eating a healthy diet.  Avoiding tobacco and drug use.  Limiting alcohol use.  Practicing safe sex.  Taking low-dose aspirin daily starting at age 62.  Taking vitamin and mineral supplements as recommended by your health care provider. What happens during an annual well check? The services and screenings done by your health care provider during your annual  well check will depend on your age, overall health, lifestyle risk factors, and family history of disease. Counseling  Your health care provider may ask you questions about your:  Alcohol use.  Tobacco use.  Drug use.  Emotional well-being.  Home and relationship well-being.  Sexual activity.  Eating habits.  Work and work Statistician.  Method of birth control.  Menstrual cycle.  Pregnancy history. Screening  You may have the following tests or measurements:  Height, weight, and BMI.  Blood pressure.  Lipid and cholesterol levels. These may be checked every 5 years, or more frequently if you are over 53 years old.  Skin check.  Lung cancer screening. You may have this screening every year starting at age 76 if you have a 30-pack-year history of smoking and currently smoke or have quit within the past 15 years.  Fecal occult blood test (FOBT) of the stool. You may have this test every year starting at age 64.  Flexible sigmoidoscopy or colonoscopy. You may have a sigmoidoscopy every 5 years or a colonoscopy every 10 years starting at age 42.  Hepatitis C blood test.  Hepatitis B blood test.  Sexually transmitted disease (STD) testing.  Diabetes screening. This is done by checking your blood sugar (glucose) after you have not eaten for a while (fasting). You may have this done every 1-3 years.  Mammogram. This may be done every 1-2 years. Talk to your health care provider about when you should start having regular mammograms. This may depend on whether you have a family history of breast cancer.  BRCA-related cancer screening. This may be done if you have a family history  of breast, ovarian, tubal, or peritoneal cancers.  Pelvic exam and Pap test. This may be done every 3 years starting at age 83. Starting at age 63, this may be done every 5 years if you have a Pap test in combination with an HPV test.  Bone density scan. This is done to screen for osteoporosis.  You may have this scan if you are at high risk for osteoporosis. Discuss your test results, treatment options, and if necessary, the need for more tests with your health care provider. Vaccines  Your health care provider may recommend certain vaccines, such as:  Influenza vaccine. This is recommended every year.  Tetanus, diphtheria, and acellular pertussis (Tdap, Td) vaccine. You may need a Td booster every 10 years.  Zoster vaccine. You may need this after age 64.  Pneumococcal 13-valent conjugate (PCV13) vaccine. You may need this if you have certain conditions and were not previously vaccinated.  Pneumococcal polysaccharide (PPSV23) vaccine. You may need one or two doses if you smoke cigarettes or if you have certain conditions. Talk to your health care provider about which screenings and vaccines you need and how often you need them. This information is not intended to replace advice given to you by your health care provider. Make sure you discuss any questions you have with your health care provider. Document Released: 12/19/2015 Document Revised: 08/11/2016 Document Reviewed: 09/23/2015 Elsevier Interactive Patient Education  2017 [place] Prevention in the Home Falls can cause injuries. They can happen to people of all ages. There are many things you can do to make your home safe and to help prevent falls. What can I do on the outside of my home?  Regularly fix the edges of walkways and driveways and fix any cracks.  Remove anything that might make you trip as you walk through a door, such as a raised step or threshold.  Trim any bushes or trees on the path to your home.  Use bright outdoor lighting.  Clear any walking paths of anything that might make someone trip, such as rocks or tools.  Regularly check to see if handrails are loose or broken. Make sure that both sides of any steps have handrails.  Any raised decks and porches should have guardrails on  the edges.  Have any leaves, snow, or ice cleared regularly.  Use sand or salt on walking paths during winter.  Clean up any spills in your garage right away. This includes oil or grease spills. What can I do in the bathroom?  Use night lights.  Install grab bars by the toilet and in the tub and shower. Do not use towel bars as grab bars.  Use non-skid mats or decals in the tub or shower.  If you need to sit down in the shower, use a plastic, non-slip stool.  Keep the floor dry. Clean up any water that spills on the floor as soon as it happens.  Remove soap buildup in the tub or shower regularly.  Attach bath mats securely with double-sided non-slip rug tape.  Do not have throw rugs and other things on the floor that can make you trip. What can I do in the bedroom?  Use night lights.  Make sure that you have a light by your bed that is easy to reach.  Do not use any sheets or blankets that are too big for your bed. They should not hang down onto the floor.  Have a firm chair that  has side arms. You can use this for support while you get dressed.  Do not have throw rugs and other things on the floor that can make you trip. What can I do in the kitchen?  Clean up any spills right away.  Avoid walking on wet floors.  Keep items that you use a lot in easy-to-reach places.  If you need to reach something above you, use a strong step stool that has a grab bar.  Keep electrical cords out of the way.  Do not use floor polish or wax that makes floors slippery. If you must use wax, use non-skid floor wax.  Do not have throw rugs and other things on the floor that can make you trip. What can I do with my stairs?  Do not leave any items on the stairs.  Make sure that there are handrails on both sides of the stairs and use them. Fix handrails that are broken or loose. Make sure that handrails are as long as the stairways.  Check any carpeting to make sure that it is firmly  attached to the stairs. Fix any carpet that is loose or worn.  Avoid having throw rugs at the top or bottom of the stairs. If you do have throw rugs, attach them to the floor with carpet tape.  Make sure that you have a light switch at the top of the stairs and the bottom of the stairs. If you do not have them, ask someone to add them for you. What else can I do to help prevent falls?  Wear shoes that:  Do not have high heels.  Have rubber bottoms.  Are comfortable and fit you well.  Are closed at the toe. Do not wear sandals.  If you use a stepladder:  Make sure that it is fully opened. Do not climb a closed stepladder.  Make sure that both sides of the stepladder are locked into place.  Ask someone to hold it for you, if possible.  Clearly mark and make sure that you can see:  Any grab bars or handrails.  First and last steps.  Where the edge of each step is.  Use tools that help you move around (mobility aids) if they are needed. These include:  Canes.  Walkers.  Scooters.  Crutches.  Turn on the lights when you go into a dark area. Replace any light bulbs as soon as they burn out.  Set up your furniture so you have a clear path. Avoid moving your furniture around.  If any of your floors are uneven, fix them.  If there are any pets around you, be aware of where they are.  Review your medicines with your doctor. Some medicines can make you feel dizzy. This can increase your chance of falling. Ask your doctor what other things that you can do to help prevent falls. This information is not intended to replace advice given to you by your health care provider. Make sure you discuss any questions you have with your health care provider. Document Released: 09/18/2009 Document Revised: 04/29/2016 Document Reviewed: 12/27/2014 Elsevier Interactive Patient Education  2017 [employer].

## 2017-10-07 NOTE — Progress Notes (Signed)
Subjective:   [name] is a 56 y.o. female who presents for an Initial Medicare Annual Wellness Visit.  Review of Systems    N/A  Cardiac Risk Factors include: diabetes mellitus;hypertension;dyslipidemia;obesity (BMI >30kg/m2)     Objective:    Today's Vitals   10/07/17 1511 10/07/17 1521  BP: 126/80   Pulse: 86   Temp: 98.2 F (36.8 C)   TempSrc: Oral   SpO2: 98%   Weight: 223 lb (101.2 kg)   Height: _0  (1.702 m)   PainSc:  3    Body mass index is 34.93 kg/m.   Current Medications (verified) Outpatient Encounter Prescriptions as of 10/07/2017  Medication Sig  . Adhesive Tape (PAPER TAPE 1"X10YD) TAPE Apply to dressings as needed  . amoxicillin-clavulanate (AUGMENTIN) 875-125 MG tablet Take 1 tablet by mouth 2 (two) times daily.  Marland Kitchen atorvastatin (LIPITOR) 20 MG tablet Take 20 mg by mouth daily.  . baclofen (LIORESAL) 20 MG tablet Take 1 tablet (20 mg total) by mouth 4 (four) times daily.  . Catheters (DOVER UNIVERSAL FOLEY TRAY) KIT Use foley tray kit to change catheter every 3 weeks or as needed  . Catheters (FOLEY CATHETER 2-WAY) MISC Use to aspirate urine suprapubic  . Cholecalciferol (D3-1000) 1000 units capsule Take 1,000 Units by mouth daily.  . Control Gel Formula Dressing (DUODERM CGF DRESSING) MISC Apply 1 each topically daily as needed.  . cyanocobalamin 500 MCG tablet Take 500 mcg by mouth daily.  . Disposable Gloves MISC Use disposable gloves to change catheter as needed  . Exenatide ER 2 MG/0.85ML AUIJ Inject 2 mg into the skin.  . FORTAMET 1000 MG 24 hr tablet Take 1,000 mg by mouth 2 (two) times daily.  . furosemide (LASIX) 20 MG tablet Take 20 mg by mouth daily.  Marland Kitchen glipiZIDE (GLUCOTROL) 5 MG tablet Take 1 tablet (5 mg total) by mouth daily before lunch.  . Hydroactive Dressings (DUODERM HYDROACTIVE) PSTE Apply to pressure wounds as needed  . hydrochlorothiazide (HYDRODIURIL) 25 MG tablet Take 25 mg by mouth daily.  . Incontinence Supply Disposable  (TENA FLEX 16 PLUS) MISC Use daily as needed  . Incontinence Supply Disposable (TENA PROTECT UNDERWEAR PLS/XLG) MISC Use for incontinence.  . Interferon Beta-1b (BETASERON) 0.3 MG KIT injection Inject 0.3 mg into the skin every other day.   . lamoTRIgine (LAMICTAL) 100 MG tablet Take 1/2 pill po x 5 days, then 1/2 pill po bid x 5 days, then 1/2 pill po tid x 5 days then 1 pill po bid  . lidocaine (XYLOCAINE) 2 % jelly USE when need with dressings  . metFORMIN (GLUCOPHAGE) 1000 MG tablet Take 1,000 mg by mouth 2 (two) times daily with a meal.  . Methenamine-Sodium Salicylate (AZO URINARY TRACT DEFENSE) 162-162.5 MG TABS Take as instructed  . MOVANTIK 25 MG TABS tablet Take 25 mg by mouth every morning.  Salley Scarlet [employer] Power wheelchair repairs  Diagnosis code z99.3  . NONFORMULARY OR COMPOUNDED ITEM [facility]:  Peripheral Neuropathy Cream - Bupivacaine 1%, Doxepin 3%, Gabapentin 6%, Pentoxifylline 3%, Topiramate 1%, apply 1-2 grams to affected area 3-4 times daily.  Marland Kitchen nystatin ointment (MYCOSTATIN) apply to affected area(s) by topical route TWICE DAILY  . Ostomy Supplies (NEW IMAGE SKIN/FLANGE/TAPE) MISC 1 application by Does not apply route daily as needed.  Marland Kitchen oxyCODONE (OXYCONTIN) 20 mg 12 hr tablet Take 1 tablet (20 mg total) by mouth 3 (three) times daily as needed (pain).  Marland Kitchen oxyCODONE-acetaminophen (PERCOCET) 10-325 MG tablet  Take 1 tablet by mouth every 8 (eight) hours as needed for pain.  . potassium chloride (K-DUR,KLOR-CON) 10 MEQ tablet TAKE 3 TABLETS BY MOUTH TWICE DAILY  . Syringe, Disposable, (B-D SYRINGE LUER-LOK 30CC) 30 ML MISC Use to change catheter  . Wound Dressings (GRX HYDROGEL GAUZE 4X4) PADS Use gauze to clean wound daily as needed   No facility-administered encounter medications on file as of 10/07/2017.     Allergies (verified) Patient has no known allergies.   History: Past Medical History:  Diagnosis Date  . Arthritis   . Decubitus ulcers   . Diabetes  mellitus without complication ([place])   . Hypertension   . MS (multiple sclerosis) ([place])    Past Surgical History:  Procedure Laterality Date  . SUPRAPUBIC CATHETER INSERTION  2009   Family History  Problem Relation Age of Onset  . Cancer Mother   . Hypertension Mother   . Heart disease Father   . Diabetes Father   . Hypertension Sister   . Hypertension Brother    Social History   Occupational History  . Not on file.   Social History Main Topics  . Smoking status: Never Smoker  . Smokeless tobacco: Never Used  . Alcohol use No  . Drug use: No  . Sexual activity: Not on file    Tobacco Counseling Counseling given: Not Answered   Activities of Daily Living In your present state of health, do you have any difficulty performing the following activities: 10/07/2017  Hearing? N  Vision? Y  Comment Patient has blurred vision  Difficulty concentrating or making decisions? N  Walking or climbing stairs? Y  Comment Patient is wheelchair bound  Dressing or bathing? Y  Comment Patient is wheelchair bound  Doing errands, shopping? Y  Comment Patient is wheelchair bound  Preparing Food and eating ? N  Using the Toilet? Y  Comment Wheelchair bound, needs assistance  In the past six months, have you accidently leaked urine? N  Do you have problems with loss of bowel control? N  Managing your Medications? N  Managing your Finances? N  Housekeeping or managing your Housekeeping? Y  Comment Wheelchair bound, needs assistance    Immunizations and Health Maintenance  There is no immunization history on file for this patient. Health Maintenance Due  Topic Date Due  . Hepatitis C Screening  [birth year]  . FOOT EXAM  01/09/1970  . OPHTHALMOLOGY EXAM  01/09/1970  . HIV Screening  01/09/1975  . PAP SMEAR  01/09/1981    Patient Care Team: Forrest Moron, MD as PCP - General (Internal Medicine) Festus Aloe, MD as Consulting Physician (Urology) Sater, Nanine Means, MD  (Neurology) Amalia Greenhouse, MD as Referring Physician (Endocrinology)  Indicate any recent Medical Services you may have received from other than [facility] providers in the past year (date may be approximate).     Assessment:   This is a routine wellness examination for [name].   Hearing/Vision screen Vision Screening Comments: Patient sees an eye doctor yearly for her routine eye exams. (Can't remember doctor's name)  Dietary issues and exercise activities discussed: Current Exercise Habits: The patient does not participate in regular exercise at present, Exercise limited by: None identified  Goals    . Stand on her own          Patient states that she would like to try to stand on her own again. Currently wheelchair bound due to weakness in her legs.       Depression Screen  PHQ 2/9 Scores 10/07/2017 10/05/2017 08/17/2017 05/11/2017 04/13/2017 03/23/2017 03/04/2017  PHQ - 2 Score 0 0 0 0 0 0 0    Fall Risk Fall Risk  10/07/2017 10/05/2017 08/17/2017 05/11/2017 04/13/2017  Falls in the past year? _0     Cognitive Function:     6CIT Screen 10/07/2017  What Year? 0 points  What month? 0 points  What time? 0 points  Count back from 20 0 points  Months in reverse 0 points  Repeat phrase 0 points  Total Score 0    Screening Tests Health Maintenance  Topic Date Due  . Hepatitis C Screening  [birth year]  . FOOT EXAM  01/09/1970  . OPHTHALMOLOGY EXAM  01/09/1970  . HIV Screening  01/09/1975  . PAP SMEAR  01/09/1981  . PNEUMOCOCCAL POLYSACCHARIDE VACCINE (1) 10/07/2018 (Originally 01/09/1962)  . INFLUENZA VACCINE  10/07/2018 (Originally 07/06/2017)  . COLONOSCOPY  10/07/2018 (Originally 01/09/2010)  . TETANUS/TDAP  10/07/2018 (Originally 01/09/1979)  . HEMOGLOBIN A1C  11/10/2017  . URINE MICROALBUMIN  12/15/2017  . MAMMOGRAM  04/14/2019      Plan:   I have personally reviewed and noted the following in the patient's chart:   . Medical and social history . Use of alcohol,  tobacco or illicit drugs  . Current medications and supplements . Functional ability and status . Nutritional status . Physical activity . Advanced directives . List of other physicians . Hospitalizations, surgeries, and ER visits in previous 12 months . Vitals . Screenings to include cognitive, depression, and falls . Referrals and appointments  In addition, I have reviewed and discussed with patient certain preventive protocols, quality metrics, and best practice recommendations. A written personalized care plan for preventive services as well as general preventive health recommendations were provided to patient.  Patient declined pneumonia, flu and shingles vaccines. Patient declined blood work at this time.  Patient declined colonoscopy.   Andrez Grime, LPN   [id number]

## 2017-10-10 DIAGNOSIS — M1991 Primary osteoarthritis, unspecified site: Secondary | ICD-10-CM | POA: Diagnosis not present

## 2017-10-10 DIAGNOSIS — G35 Multiple sclerosis: Secondary | ICD-10-CM | POA: Diagnosis not present

## 2017-10-10 DIAGNOSIS — E119 Type 2 diabetes mellitus without complications: Secondary | ICD-10-CM | POA: Diagnosis not present

## 2017-10-10 DIAGNOSIS — T25221D Burn of second degree of right foot, subsequent encounter: Secondary | ICD-10-CM | POA: Diagnosis not present

## 2017-10-10 DIAGNOSIS — I10 Essential (primary) hypertension: Secondary | ICD-10-CM | POA: Diagnosis not present

## 2017-10-10 DIAGNOSIS — L89313 Pressure ulcer of right buttock, stage 3: Secondary | ICD-10-CM | POA: Diagnosis not present

## 2017-10-12 ENCOUNTER — Other Ambulatory Visit: Payer: Self-pay | Admitting: Gastroenterology

## 2017-10-12 DIAGNOSIS — K5903 Drug induced constipation: Secondary | ICD-10-CM | POA: Diagnosis not present

## 2017-10-12 DIAGNOSIS — T402X5A Adverse effect of other opioids, initial encounter: Secondary | ICD-10-CM | POA: Diagnosis not present

## 2017-10-12 DIAGNOSIS — R6881 Early satiety: Secondary | ICD-10-CM | POA: Diagnosis not present

## 2017-10-13 NOTE — Progress Notes (Unsigned)
Current Medications Reason for Appointment   1. NP - constipation-ref Dr. Delia Chimes [phone]-notes in system-MCR-MCD - ala   2. Pt c/o chroinc constipation and stomach issues along with hemrroids  Taking  History of Present Illness  Atorvastatin Calcium 10 MG Tablet TAKE ONE TABLET BY MOUTH DAILY Oral  General:  56/female was referred for constipation. As per outside records she had colonoscopy in 2013 which showed benign lipomatous lesion, mild sigmoid diverticulosis, internal hemorrhoids.She was treated for constipation with movantik.She used to be on amitiza, linzess and miralax.She had rectal pain, burning and itching and was treated with Anusol, she was seen by a surgeon and not deemed a candidate for hemorrhoidectomy. She has had chronic anemia, Hb was 9.8 in 2017, EGD from 2011 showed benign gastric nodules. She has been on movantik for 1 year and had 3 Bm/week, Monday/Tuesday/Thursday she would take movantik. Currently she has good and bad weeks, she had a Bm on Monday but had none on Tuesday. She still needs to strain. Consistency of stool is formed but not hard. These days she has 2-3 Bm/week. She has been on lactulose syrup and powder and it worked for a while before not working. She also use stool softners 1 pill 3 times a week,OTC Dulcolax pills as needed 3/week. She has fair appetite, she has lost over 100lbs in 2 years, she has nausea and has early satiety(feels like she had a gastric bypass), she has nausea but denies vomiting. She has occasional abdominal pain and tightness. She eats a brunch(bowl of cereal), within an hour tomato sandwich and chips and soda, then eats something sweet as cookies and candy and dinner is whatever she feels like. She states she eats an apple and states she eats vegetables with every meal. She claims that she doesn't drink enough water, she will drink about 8 oz of water a day or less. She relocated from [place] last year.  Metformin  HCl 1000 MG Tablet TAKE ONE TABLET BY MOUTH TWICE DAILY Oral    Lasix(Furosemide) 20 MG Tablet 1 tablet Orally Once a day    Hydrochlorothiazide 25 MG Tablet 1 tablet in the morning Orally Once a day    Lidocaine HCl 2 % Gel APPLY TOPICALLY TO WOUNDS AS NEEDED External    Oxycodone-Acetaminophen 10-325 MG Tablet (Schedule II Drug) TAKE ONE TABLET BY MOUTH EVERY 8 HOURS AS NEEDED FOR PAIN Oral    GlipiZIDE 5 MG Tablet TAKE ONE TABLET BY MOUTH DAILY BEFORE LUNCH Oral    Movantik(Naloxegol Oxalate) 25 MG Tablet TAKE ONE TABLET BY MOUTH IN THE MORNING DAILY Oral    Nystatin 100000 UNIT/GM Ointment APPLY TOPICALLY TO THE AFFECTED AREA(S) TWICE DAILY External     Vital Signs   Wt WC, Ht 67, Temp 98.1, Pulse sitting 96, BP sitting 122/74.  Past Medical History Examination  Arthritis.  Gastroenterology:: GENERAL APPEARANCE: Well developed, obese,active distress, pleasant, no acute distress .  EYES: Lids and conjunctiva normal. Sclera normal, pupils equal and reactive .  ORAL CAVITY: Lips, teeth and gums are normal. Pharynx, tongue, mucosa normal .  SCLERA: anicteric .  NECK Full ROM, trachea midline, no thyromegaly or masses .  CARDIOVASCULAR PMI LS border. Normal RRR w/o murmers or gallops. No peripheral edema .  RESPIRATORY Breath sounds normal. Respiration even and unlabored .  ABDOMEN No masses palpated. Liver and spleen not palpated, normal. Bowel sounds normal, Abdomen not distended .  EXTREMITIES: No edema, pulses intact .  NEURO: decreased tone and  strenght in lower extremities,ambulates in a wheel chair.  PSYCH: mood/affect normal .    type II diabetes.    Hypertension.    Ulcers decubitus.    Multiple sclerosis.    Surgical History Assessments  Superpubic catheter  1. Early satiety - R68.81 (Primary)   2. Adverse effect of other opioids, initial encounter - T40.2X5A   3. Drug induced constipation - K59.03  Family History Treatment  Father: deceased, diagnosed with Diabetes,  Hypertension 1. Early satiety  IMAGING: Esophagoscopy     Whitfield,Dia 10/12/2017 04:54:25 PM > spoke with Vickie-scheduled for 12/02/17-[facility] at 8:30am-prep instructions given.    Notes: Patient is concerned about weight loss and not being able to eat her full meals. Will proceed with a diagnostic EGD?PUD?malignancy.  Referral To: Reason:EGD w/propofol-spoke with [phone]   Mother: deceased, diagnosed with Hypertension  2. Drug induced constipation  Start Movantik Tablet, 25 MG, 1 tablet in the morning, Orally, Once a day, 90 days, 90 Tablet, Refills 1 Notes: She wants to take laxatives only on 3 days of the week(Monday, Tuesday and Thrusday) as she cannot walk and needs help with cleaning. She is not willing to take movantik everyday as on other days if she has a BM she has no help with cleaning herself post defecation. She lives with her daughter, uses a catheter for urination. SHe wants to try taking movantik 25 mg 2 pills on those 3 days. She is not interested in taking linzess/amitiza/lactulose/miralax as she has tried all of that in the past. She needs her pain meds(narcotics) as she has pain all over from her MS. SHe understands that regular use of narcotics is the reason for her constipation. Advised patient to take high fiber diet and take at least 60 oz of water a day.   No Family History of Colon Cancer, Polyps, or Liver Disease.   Social History   General:  Tobacco use  cigarettes: Never smoked no Alcohol, no.  no Recreational drug use, no.    Allergies   N.K.D.A.   Hospitalization/Major Diagnostic Procedure   None in this past year 10/2017   Review of Systems   CONSTITUTIONAL:  Chills No. Fatigue No. Fever No. Insomnia No. Night sweats No. Weight change No.  HEENT:  Change in vision No. Double vision No . Hoarseness No. Nose bleeds No. sore throat No. Sinus Problems No. Glaucoma No.  CARDIOLOGY:  ByPass Surgery No. Poor Circulation No. Artificial Heart Valves No.  High blood pressure YES. History of Heart attack No. Irregular heart beat No. Known coronary artery disease No. Pacemaker/Defibrillator No.  RESPIRATORY:  Shortness of breath No. Cough No. Excessive Sputum No. Using Oxygen No. Asthma No. Bronchitis No. Pneumonia No. Sleep Apnea No.  UROLOGY:  Interstitials Cystitis No. Incontinence YES. Blood in urine No. Difficulty urinating No. Kidney disease No. Kidney stones No.  GASTROENTEROLOGY:  Abdominal pain YES. Acid reflux No. Black stools No. Bloating/belching No. Change in bowel habits YES. Constipation YES. Dark tarry stools No. Diarrhea No. Difficulty swallowing No. Heartburn No. Incontinence of stool No. Indigestion: No. Lactose intolerance No. Nausea YES. Rectal bleeding No. Vomiting No. Hepatitis/yellow jaundice No. History of Ulcers No. Use of Pain Medication YES. Previous Colonoscopy No.  MUSCULOSKELETAL:  joint pain No. Arthritis YES. Joint Replacement No.  NEUROLOGY:  Dizziness No. Fainting No. Headache No. Paralysis No. Seizures No. Strokes No. Weakness YES. Alzheimer's No.  SKIN:  Rash No. Bruises easily No.  ENDOCRINOLOGY:  Diabetes YES. High cholesterol YES. Thyroid disorder No.  HEMATOLOGY/LYMPH:  Abnormal bleeding No. Anemia No. Enlarged lymph nodes No. Past blood transfusion No. Swollen glands No. Blood Clots No. Using Blood Thinners No.  INFECTIOUS DISEASE:  HIV/AIDS No. Tuberculosis No . Hepatitis No. Sexually Transmitted Disease No. MRSA No.  GI PROCEDURE:  no Pacemaker/ AICD. no Artificial heart valves. no MI/heart attack. no Abnormal heart rhythm. no Angina. no CVA. Hypertension YES. no Hypotension. no Asthma, COPD. no Sleep apnea. no Seizure disorders. no Artificial joints. no Severe DJD. Diabetes YES. no Significant headaches. no Vertigo. no Depression/anxiety. no Abnormal bleeding. no Kidney Disease. no Liver disease. no Chance of pregnancy. no Blood transfusion. no Method of Birth Control. no Birth control pills.  GU/GYN:   Pregnant Now No. Trying to conceive No. Use Birth Control No. Heavy Periods No.      Ronnette Juniper, MD

## 2017-10-19 ENCOUNTER — Ambulatory Visit (INDEPENDENT_AMBULATORY_CARE_PROVIDER_SITE_OTHER): Payer: Medicare Other | Admitting: Podiatry

## 2017-10-19 ENCOUNTER — Encounter: Payer: Self-pay | Admitting: Podiatry

## 2017-10-19 DIAGNOSIS — M79676 Pain in unspecified toe(s): Secondary | ICD-10-CM

## 2017-10-19 DIAGNOSIS — B351 Tinea unguium: Secondary | ICD-10-CM

## 2017-10-19 DIAGNOSIS — L989 Disorder of the skin and subcutaneous tissue, unspecified: Secondary | ICD-10-CM

## 2017-10-19 DIAGNOSIS — E0843 Diabetes mellitus due to underlying condition with diabetic autonomic (poly)neuropathy: Secondary | ICD-10-CM | POA: Diagnosis not present

## 2017-10-21 DIAGNOSIS — I10 Essential (primary) hypertension: Secondary | ICD-10-CM | POA: Diagnosis not present

## 2017-10-21 DIAGNOSIS — G35 Multiple sclerosis: Secondary | ICD-10-CM | POA: Diagnosis not present

## 2017-10-21 DIAGNOSIS — L89313 Pressure ulcer of right buttock, stage 3: Secondary | ICD-10-CM | POA: Diagnosis not present

## 2017-10-21 DIAGNOSIS — E1140 Type 2 diabetes mellitus with diabetic neuropathy, unspecified: Secondary | ICD-10-CM | POA: Diagnosis not present

## 2017-10-23 NOTE — Progress Notes (Signed)
    Subjective: Patient is a 56 y.o. female with PMHx of DM presenting to the office today with a chief complaint of painful callus lesions to the bilateral feet that have been present for several weeks.  Patient also complains of elongated, thickened nails that cause pain while ambulating in shoes. Patient is unable to trim their own nails. Patient presents today for further treatment and evaluation.   Past Medical History:  Diagnosis Date  . Arthritis   . Decubitus ulcers   . Diabetes mellitus without complication ([place])   . Hypertension   . MS (multiple sclerosis) ([place])     Objective:  Physical Exam General: Alert and oriented x3 in no acute distress  Dermatology: Hyperkeratotic lesions present on the bilateral feet x 2. Pain on palpation with a central nucleated core noted. Skin is warm, dry and supple bilateral lower extremities. Negative for open lesions or macerations. Nails are tender, long, thickened and dystrophic with subungual debris, consistent with onychomycosis, 1-5 bilateral. No signs of infection noted.  Vascular: Palpable pedal pulses bilaterally. No edema or erythema noted. Capillary refill within normal limits.  Neurological: Epicritic and protective threshold grossly intact bilaterally.   Musculoskeletal Exam: Pain on palpation at the keratotic lesion noted. Range of motion within normal limits bilateral. Muscle strength 5/5 in all groups bilateral.  Assessment: 1. Onychodystrophic nails 1-5 bilateral with hyperkeratosis of nails.  2. Onychomycosis of nail due to dermatophyte bilateral 3. Porokeratosis to the bilateral feet x 2   Plan of Care:  #1 Patient evaluated. #2 Excisional debridement of keratoic lesion using a chisel blade was performed without incident.  #3 Dressed with light dressing. #4 Mechanical debridement of nails 1-5 bilaterally performed using a nail nipper. Filed with dremel without incident.  #5 Patient is to return to the clinic in 3  months.   Edrick Kins, DPM [facility]  Dr. Edrick Kins, [address]                                        [place], [place] [address]                Office [phone]  Fax [phone]

## 2017-10-26 ENCOUNTER — Other Ambulatory Visit: Payer: Self-pay | Admitting: Family Medicine

## 2017-10-28 NOTE — Telephone Encounter (Signed)
Thanks

## 2017-11-03 ENCOUNTER — Ambulatory Visit (INDEPENDENT_AMBULATORY_CARE_PROVIDER_SITE_OTHER): Payer: Medicare Other | Admitting: Orthotics

## 2017-11-03 DIAGNOSIS — L84 Corns and callosities: Secondary | ICD-10-CM | POA: Diagnosis not present

## 2017-11-03 DIAGNOSIS — E0843 Diabetes mellitus due to underlying condition with diabetic autonomic (poly)neuropathy: Secondary | ICD-10-CM | POA: Diagnosis not present

## 2017-11-03 DIAGNOSIS — N318 Other neuromuscular dysfunction of bladder: Secondary | ICD-10-CM | POA: Diagnosis not present

## 2017-11-03 DIAGNOSIS — L989 Disorder of the skin and subcutaneous tissue, unspecified: Secondary | ICD-10-CM

## 2017-11-03 DIAGNOSIS — E0842 Diabetes mellitus due to underlying condition with diabetic polyneuropathy: Secondary | ICD-10-CM

## 2017-11-09 ENCOUNTER — Telehealth: Payer: Self-pay | Admitting: Family Medicine

## 2017-11-09 NOTE — Telephone Encounter (Signed)
Oxycodone refill. Last OV 10/09/17. Last refill on 10/05/17

## 2017-11-09 NOTE — Telephone Encounter (Signed)
Copied from [place] [id number]. Topic: Quick Communication - See Telephone Encounter >> Nov 09, 2017  9:44 AM Burnis Medin, NT wrote: CRM for notification. See Telephone encounter for: Pt. Is calling to get a refill on oxyCODONE-acetaminophen (PERCOCET) 10-325 MG tablet and oxyCODONE-acetaminophen (PERCOCET) 10-325 MG tablet. Pt uses [facility] [place], [place]  11/09/17.

## 2017-11-11 ENCOUNTER — Encounter (HOSPITAL_BASED_OUTPATIENT_CLINIC_OR_DEPARTMENT_OTHER): Payer: Medicare Other | Attending: Internal Medicine

## 2017-11-11 DIAGNOSIS — L89313 Pressure ulcer of right buttock, stage 3: Secondary | ICD-10-CM | POA: Insufficient documentation

## 2017-11-11 DIAGNOSIS — M1990 Unspecified osteoarthritis, unspecified site: Secondary | ICD-10-CM | POA: Diagnosis not present

## 2017-11-11 DIAGNOSIS — I10 Essential (primary) hypertension: Secondary | ICD-10-CM | POA: Diagnosis not present

## 2017-11-11 DIAGNOSIS — G629 Polyneuropathy, unspecified: Secondary | ICD-10-CM | POA: Insufficient documentation

## 2017-11-11 DIAGNOSIS — G35 Multiple sclerosis: Secondary | ICD-10-CM | POA: Insufficient documentation

## 2017-11-16 ENCOUNTER — Telehealth: Payer: Self-pay | Admitting: Family Medicine

## 2017-11-16 ENCOUNTER — Ambulatory Visit: Payer: Medicare Other | Admitting: Family Medicine

## 2017-11-16 NOTE — Telephone Encounter (Signed)
No information on CRM Please complete request.

## 2017-11-16 NOTE — Telephone Encounter (Unsigned)
Copied from [place] [phone]. Topic: Quick Communication - Rx Refill/Question >> Nov 16, 2017  9:13 AM Malena Catholic I, NT wrote: Has the patient contacted their pharmacy? {yes [id number]}  (Agent: If no, request that the patient contact the pharmacy for the refill.)  Preferred Pharmacy (with phone number or street name): ***  Agent: Please be advised that RX refills may take up to 3 business days. We ask that you follow-up with your pharmacy.

## 2017-11-17 MED ORDER — OXYCODONE-ACETAMINOPHEN 10-325 MG PO TABS: 1.0000 | ORAL_TABLET | Freq: Three times a day (TID) | ORAL | 0 refills | Status: DC | PRN

## 2017-11-17 MED ORDER — OXYCODONE HCL ER 20 MG PO T12A: 20.0000 mg | EXTENDED_RELEASE_TABLET | Freq: Three times a day (TID) | ORAL | 0 refills | Status: DC | PRN

## 2017-11-17 NOTE — Telephone Encounter (Signed)
Controlled sent electronically

## 2017-11-18 NOTE — Progress Notes (Signed)

## 2017-11-23 ENCOUNTER — Other Ambulatory Visit: Payer: Self-pay | Admitting: Family Medicine

## 2017-11-23 ENCOUNTER — Ambulatory Visit (INDEPENDENT_AMBULATORY_CARE_PROVIDER_SITE_OTHER): Payer: Medicare Other | Admitting: Neurology

## 2017-11-23 ENCOUNTER — Encounter: Payer: Self-pay | Admitting: Neurology

## 2017-11-23 ENCOUNTER — Other Ambulatory Visit: Payer: Self-pay

## 2017-11-23 VITALS — BP 126/75 | HR 85 | Resp 16 | Ht 67.0 in | Wt 223.0 lb

## 2017-11-23 DIAGNOSIS — G35 Multiple sclerosis: Secondary | ICD-10-CM

## 2017-11-23 DIAGNOSIS — R29898 Other symptoms and signs involving the musculoskeletal system: Secondary | ICD-10-CM

## 2017-11-23 DIAGNOSIS — G8220 Paraplegia, unspecified: Secondary | ICD-10-CM

## 2017-11-23 DIAGNOSIS — R208 Other disturbances of skin sensation: Secondary | ICD-10-CM

## 2017-11-23 DIAGNOSIS — Z9289 Personal history of other medical treatment: Secondary | ICD-10-CM

## 2017-11-23 DIAGNOSIS — Z978 Presence of other specified devices: Secondary | ICD-10-CM

## 2017-11-23 DIAGNOSIS — Z960 Presence of urogenital implants: Secondary | ICD-10-CM

## 2017-11-23 NOTE — Telephone Encounter (Signed)
Pt requesting multiple refills  LOV 10/05/17 with Dr. Nolon Rod; CMP on 08/17/17

## 2017-11-23 NOTE — Progress Notes (Signed)
[facility]  PATIENT: [name] DOB: September 18, 1961  REFERRING DOCTOR OR PCP:  Delia Chimes SOURCE: patient, records from Dr. Nolon Rod  _________________________________   HISTORICAL  CHIEF COMPLAINT:  Chief Complaint  Patient presents with  . Multiple Sclerosis    Sts. she continues to tolerate Betaseron well.  Denies new or worsening sx.  Sts. spasticity is just a little improved since increasing Baclofen to 20-20-40/fim    HISTORY OF PRESENT ILLNESS:  [name] is a 56 year old woman who was diagnosed with MS in 2006.  Uptake 11/23/2017:    She feels her MS has been stable. She has been on Betaseron for many years. She had exacerbations on Copaxone. She is wheelchair-bound and has been for several years. She can transfer with assistance independently but has noted more difficulty doing that over the past few months. She does have spasticity in both legs, right equals left. Higher dose of baclofen did not help the spasticity any.  She notes visual blurring out of the left eye. Right eye visual acuity is fine. She has a suprapubic catheter for bladder dysfunction. She has had that since her large exacerbation in 2008. She notes some fatigue but has not been getting a full night's rest every night. She probably sleeps 4-5 hours most nights. She denies any depression or cognitive problems.   From 05/18/2017: MS:   She is on Betaseron. She had exacerbations while on Copaxone. She tolerates the Betaseron well and is not interested in switching to a different medication was more than 10 years ago. She is claustrophobic and does not wish to have another MRI if possible.  Gait/strength/sensation: She has been wheelchair bound for several years and was using a walker since 2007 shortly after her diagnosis. She helps him with transfers cannot transfer independently. Her arms remained strong but she has weakness in both legs with spasticity. Muscle spasticity is worse at  night but can occur during the day as well.   She takes baclofen 20 mg 3 times a day. She denies any numbness or tingling in the arms. She has a mild burning sensation in her feet. Lamotrigine did not help the dysesthetic sensation.   Bladder/bowel: She has a suprapubic catheter since 2009 due to urinary retention. She denies any bowel dysfunction.  She gets frequent UTI's     She recently started taking cranberries and drinks lots of fluids to try to prevent them.    Vision:  She notes some difficulty with her left eye and has been told by her ophthalmologist that it is due to a combination of MS and diabetes.    Fatigue/sleep: She denies any significant problems with fatigue. She sleeps well at night. She gets some spasms when she lays down but once she falls asleep she stays asleep.   Mood/cognition:  She feels her mood is good. She denies any depression or anxiety. She denies any difficulty with her memory or other cognitive skills.   MS History:   In 2006, she presented with tingling in her hands and right greater than left leg weakness.    She saw Dr. Luberta Robertson in [facility]. She was placed on Copaxone. Unfortunately,  she had a major exacerbation later that year and lost most of the use of her legs.  She was then placed on Betaseron and remains on Betaseron. She does not think she has had any exacerbations while she is on it. She tolerates it well and does not have any skin reactions.  Since 2007, she has been predominantly wheelchair bound strength has progressively worsened and she went from using a walker for short distances to being completely wheelchair bound a few years ago.   Her last MRI was greater than 10 years ago.  REVIEW OF SYSTEMS: Constitutional: No fevers, chills, sweats, or change in appetite Eyes: Reduced left vision.  No eye pain Ear, nose and throat: No hearing loss, ear pain, nasal congestion, sore throat Cardiovascular: No chest pain, palpitations Respiratory: No  shortness of breath at rest or with exertion.   No wheezes GastrointestinaI: No nausea, vomiting, diarrhea, abdominal pain, fecal incontinence Genitourinary: She has a suprapubic catheter.. Musculoskeletal: No neck pain, back pain Integumentary: No rash, pruritus, skin lesions Neurological: as above Psychiatric: Denies depression at this time.  No anxiety Endocrine: has NIDDM Hematologic/Lymphatic: No anemia, purpura, petechiae. Allergic/Immunologic: No itchy/runny eyes, nasal congestion, recent allergic reactions, rashes  ALLERGIES: No Known Allergies  HOME MEDICATIONS:  Current Outpatient Medications:  .  Adhesive Tape (PAPER TAPE 1"X10YD) TAPE, Apply to dressings as needed, Disp: 4 each, Rfl: 6 .  atorvastatin (LIPITOR) 10 MG tablet, Take 10 mg by mouth daily. , Disp: , Rfl:  .  baclofen (LIORESAL) 20 MG tablet, Take 1 tablet (20 mg total) by mouth 4 (four) times daily., Disp: 360 tablet, Rfl: 3 .  Catheters (DOVER UNIVERSAL FOLEY TRAY) KIT, Use foley tray kit to change catheter every 3 weeks or as needed, Disp: 15 each, Rfl: 1 .  Catheters (FOLEY CATHETER 2-WAY) MISC, Use to aspirate urine suprapubic, Disp: 15 each, Rfl: 1 .  Cholecalciferol (D3-1000) 1000 units capsule, Take 2,000 Units by mouth daily. , Disp: , Rfl:  .  Control Gel Formula Dressing (DUODERM CGF DRESSING) MISC, Apply 1 each topically daily as needed., Disp: 5 each, Rfl: 11 .  cyanocobalamin 500 MCG tablet, Take 500 mcg by mouth daily., Disp: , Rfl:  .  Disposable Gloves MISC, Use disposable gloves to change catheter as needed, Disp: 200 each, Rfl: 11 .  furosemide (LASIX) 20 MG tablet, Take 20 mg by mouth daily., Disp: , Rfl:  .  glipiZIDE (GLUCOTROL) 5 MG tablet, Take 1 tablet (5 mg total) by mouth daily before lunch., Disp: 90 tablet, Rfl: 3 .  Hydroactive Dressings (DUODERM HYDROACTIVE) PSTE, Apply to pressure wounds as needed, Disp: 60 g, Rfl: 11 .  hydrochlorothiazide (HYDRODIURIL) 25 MG tablet, Take 25 mg  by mouth daily., Disp: , Rfl:  .  Incontinence Supply Disposable (TENA FLEX 16 PLUS) MISC, Use daily as needed, Disp: 30 each, Rfl: 11 .  Incontinence Supply Disposable (TENA PROTECT UNDERWEAR PLS/XLG) MISC, Use for incontinence., Disp: 30 each, Rfl: 11 .  Interferon Beta-1b (BETASERON) 0.3 MG KIT injection, Inject 0.3 mg into the skin every other day. , Disp: , Rfl:  .  lidocaine (XYLOCAINE) 2 % jelly, USE when need with dressings, Disp: , Rfl: 1 .  metFORMIN (GLUCOPHAGE) 1000 MG tablet, Take 1,000 mg by mouth 2 (two) times daily with a meal., Disp: , Rfl:  .  Methenamine-Sodium Salicylate (AZO URINARY TRACT DEFENSE) 162-162.5 MG TABS, Take as instructed, Disp: , Rfl:  .  MOVANTIK 25 MG TABS tablet, Take 25 mg by mouth every morning., Disp: , Rfl: 4 .  NON FORMULARY, Power wheelchair repairs  Diagnosis code z99.3, Disp: 1 each, Rfl: 0 .  nystatin ointment (MYCOSTATIN), Apply to affected area twice daily as needed for irritation, Disp: , Rfl: 2 .  Ostomy Supplies (NEW IMAGE SKIN/FLANGE/TAPE) MISC, 1 application by  Does not apply route daily as needed., Disp: 30 each, Rfl: 11 .  oxyCODONE (OXYCONTIN) 20 mg 12 hr tablet, Take 1 tablet (20 mg total) by mouth 3 (three) times daily as needed (pain)., Disp: 90 tablet, Rfl: 0 .  oxyCODONE-acetaminophen (PERCOCET) 10-325 MG tablet, Take 1 tablet by mouth every 8 (eight) hours as needed for pain., Disp: 90 tablet, Rfl: 0 .  potassium chloride (K-DUR,KLOR-CON) 10 MEQ tablet, TAKE 3 TABLETS BY MOUTH TWICE DAILY (Patient taking differently: Take 30 MEQ by mouth twice daily), Disp: 180 tablet, Rfl: 2 .  Syringe, Disposable, (B-D SYRINGE LUER-LOK 30CC) 30 ML MISC, Use to change catheter, Disp: 15 each, Rfl: 0 .  Wound Dressings (GRX HYDROGEL GAUZE 4X4) PADS, Use gauze to clean wound daily as needed, Disp: 200 each, Rfl: 11  PAST MEDICAL HISTORY: Past Medical History:  Diagnosis Date  . Arthritis   . Decubitus ulcers   . Diabetes mellitus without complication  ([place])   . Hypertension   . MS (multiple sclerosis) ([place])     PAST SURGICAL HISTORY: Past Surgical History:  Procedure Laterality Date  . SUPRAPUBIC CATHETER INSERTION  2009    FAMILY HISTORY: Family History  Problem Relation Age of Onset  . Cancer Mother   . Hypertension Mother   . Heart disease Father   . Diabetes Father   . Hypertension Sister   . Hypertension Brother     SOCIAL HISTORY:  Social History   Socioeconomic History  . Marital status: Single    Spouse name: Not on file  . Number of children: Not on file  . Years of education: Not on file  . Highest education level: Not on file  Social Needs  . Financial resource strain: Not on file  . Food insecurity - worry: Not on file  . Food insecurity - inability: Not on file  . Transportation needs - medical: Not on file  . Transportation needs - non-medical: Not on file  Occupational History  . Not on file  Tobacco Use  . Smoking status: Never Smoker  . Smokeless tobacco: Never Used  Substance and Sexual Activity  . Alcohol use: No  . Drug use: No  . Sexual activity: Not on file  Other Topics Concern  . Not on file  Social History Narrative  . Not on file     PHYSICAL EXAM  Vitals:   11/23/17 1416  BP: 126/75  Pulse: 85  Resp: 16  Weight: 223 lb (101.2 kg)  Height: '5\' 7"'$  (1.702 m)    Body mass index is 34.93 kg/m.   General: The patient is well-developed and well-nourished and in no acute distress in a wheelchair  Neck:    The neck is nontender with a good range of motion.   Skin/Ext.: Arms/legs without rash.   She has mild pedal edema.  Neurologic Exam  Mental status: The patient is alert and oriented x 3 at the time of the examination. The patient has apparent normal recent and remote memory, with an apparently normal attention span and concentration ability.   Speech is normal.  Cranial nerves: Extraocular movements are full. Her pupils react equally to light and accommodation.  Sensation is normal. Trapezius and sternocleidomastoid strength is normal. No dysarthria is noted.  The tongue is midline, and the patient has symmetric elevation of the soft palate. No obvious hearing deficits are noted.  Motor:  Muscle bulk is normal.   Tone is increased in both legs, slightly more on the right  than the left. She has normal strength in both arms. However strength is only 0-1/5 in both legs, probably slightly worse on the right.   Sensory: She reports normal sensation to touch and vibration in the arms. She has normal touch sensation in the proximal legs and normal vibration sensation at the knees but both are reduced at the feet and ankles   Coordination: Cerebellar testing reveals good finger-nose-finger .  RAM in hands is normal.   She is unable to do heel-to-shin  Gait and station: She is wheelchair bound and cannot stand   Reflexes: Deep tendon reflexes are normal in the arms. She has increased reflexes at the knees with spread. She has reduced reflexes at the ankles. There is no clonus.     DIAGNOSTIC DATA (LABS, IMAGING, TESTING) - I reviewed patient records, labs, notes, testing and imaging myself where available.      ASSESSMENT AND PLAN  Multiple sclerosis (HCC)  Leg weakness, bilateral  Diplegia of both lower extremities (HCC)  Dysesthesia  Chronic indwelling Foley catheter    1.   Continue Betaseron. We would need to consider a different medication if she has a definite exacerbation.   She gets blood work a couple times a year with her primary care doctor.  2.  Continue baclofen. If the spasticity worsens, consider adding tizanidine as her current dose of baclofen is already fairly high at 80 mg a day..  3.    Continue her other medications.  4.    Follow-up in 12 months but call sooner if she notes any new or worsening neurologic symptoms.   Taite Baldassari A. Felecia Shelling, MD, PhD 00/76/2263, 3:35 PM Certified in Neurology, Clinical Neurophysiology, Sleep  Medicine, Pain Medicine and Neuroimaging  [facility] [address] [place], [place] [address] [phone]

## 2017-11-24 DIAGNOSIS — N318 Other neuromuscular dysfunction of bladder: Secondary | ICD-10-CM | POA: Diagnosis not present

## 2017-11-25 DIAGNOSIS — G35 Multiple sclerosis: Secondary | ICD-10-CM | POA: Diagnosis not present

## 2017-11-25 DIAGNOSIS — I10 Essential (primary) hypertension: Secondary | ICD-10-CM | POA: Diagnosis not present

## 2017-11-25 DIAGNOSIS — G629 Polyneuropathy, unspecified: Secondary | ICD-10-CM | POA: Diagnosis not present

## 2017-11-25 DIAGNOSIS — M1990 Unspecified osteoarthritis, unspecified site: Secondary | ICD-10-CM | POA: Diagnosis not present

## 2017-11-25 DIAGNOSIS — L89313 Pressure ulcer of right buttock, stage 3: Secondary | ICD-10-CM | POA: Diagnosis not present

## 2017-11-25 NOTE — H&P (Signed)
History of Present Illness  General:  56/female was referred for constipation. As per outside records she had colonoscopy in 2013 which showed benign lipomatous lesion, mild sigmoid diverticulosis, internal hemorrhoids.She was treated for constipation with movantik.She used to be on amitiza, linzess and miralax.She had rectal pain, burning and itching and was treated with Anusol, she was seen by a surgeon and not deemed a candidate for hemorrhoidectomy. She has had chronic anemia, Hb was 9.8 in 2017, EGD from 2011 showed benign gastric nodules. She has been on movantik for 1 year and had 3 Bm/week, Monday/Tuesday/Thursday she would take movantik. Currently she has good and bad weeks, she had a Bm on Monday but had none on Tuesday. She still needs to strain. Consistency of stool is formed but not hard. These days she has 2-3 Bm/week. She has been on lactulose syrup and powder and it worked for a while before not working. She also use stool softners 1 pill 3 times a week,OTC Dulcolax pills as needed 3/week. She has fair appetite, she has lost over 100lbs in 2 years, she has nausea and has early satiety(feels like she had a gastric bypass), she has nausea but denies vomiting. She has occasional abdominal pain and tightness. She eats a brunch(bowl of cereal), within an hour tomato sandwich and chips and soda, then eats something sweet as cookies and candy and dinner is whatever she feels like. She states she eats an apple and states she eats vegetables with every meal. She claims that she doesn't drink enough water, she will drink about 8 oz of water a day or less. She relocated from [place] last year.   Current Medications  Taking   Atorvastatin Calcium 10 MG Tablet TAKE ONE TABLET BY MOUTH DAILY Oral   Metformin HCl 1000 MG Tablet TAKE ONE TABLET BY MOUTH TWICE DAILY Oral   Lasix(Furosemide) 20 MG Tablet 1 tablet Orally Once a day   Hydrochlorothiazide 25 MG Tablet 1 tablet in the morning  Orally Once a day   Lidocaine HCl 2 % Gel APPLY TOPICALLY TO WOUNDS AS NEEDED External   Oxycodone-Acetaminophen 10-325 MG Tablet (Schedule II Drug) TAKE ONE TABLET BY MOUTH EVERY 8 HOURS AS NEEDED FOR PAIN Oral   GlipiZIDE 5 MG Tablet TAKE ONE TABLET BY MOUTH DAILY BEFORE LUNCH Oral   Movantik(Naloxegol Oxalate) 25 MG Tablet TAKE ONE TABLET BY MOUTH IN THE MORNING DAILY Oral   Nystatin 100000 UNIT/GM Ointment APPLY TOPICALLY TO THE AFFECTED AREA(S) TWICE DAILY External    Past Medical History  Arthritis.   type II diabetes.   Hypertension.   Ulcers decubitus.   Multiple sclerosis.    Surgical History  Superpubic catheter    Family History  Father: deceased, diagnosed with Diabetes, Hypertension  Mother: deceased, diagnosed with Hypertension  No Family History of Colon Cancer, Polyps, or Liver Disease.   Social History  General:  Tobacco use  cigarettes: Never smoked no Alcohol, no.  no Recreational drug use, no.    Allergies  N.K.D.A.   Hospitalization/Major Diagnostic Procedure  None in this past year 10/2017   Review of Systems  CONSTITUTIONAL:  Chills No. Fatigue No. Fever No. Insomnia No. Night sweats No. Weight change No.  HEENT:  Change in vision No. Double vision No . Hoarseness No. Nose bleeds No. sore throat No. Sinus Problems No. Glaucoma No.  CARDIOLOGY:  ByPass Surgery No. Poor Circulation No. Artificial Heart Valves No. High blood pressure YES. History of Heart attack No. Irregular heart beat  No. Known coronary artery disease No. Pacemaker/Defibrillator No.  RESPIRATORY:  Shortness of breath No. Cough No. Excessive Sputum No. Using Oxygen No. Asthma No. Bronchitis No. Pneumonia No. Sleep Apnea No.  UROLOGY:  Interstitials Cystitis No. Incontinence YES. Blood in urine No. Difficulty urinating No. Kidney disease No. Kidney stones No.  GASTROENTEROLOGY:  Abdominal pain YES. Acid reflux No. Black stools No. Bloating/belching No. Change in bowel habits  YES. Constipation YES. Dark tarry stools No. Diarrhea No. Difficulty swallowing No. Heartburn No. Incontinence of stool No. Indigestion: No. Lactose intolerance No. Nausea YES. Rectal bleeding No. Vomiting No. Hepatitis/yellow jaundice No. History of Ulcers No. Use of Pain Medication YES. Previous Colonoscopy No.  MUSCULOSKELETAL:  joint pain No. Arthritis YES. Joint Replacement No.  NEUROLOGY:  Dizziness No. Fainting No. Headache No. Paralysis No. Seizures No. Strokes No. Weakness YES. Alzheimer's No.  SKIN:  Rash No. Bruises easily No.  ENDOCRINOLOGY:  Diabetes YES. High cholesterol YES. Thyroid disorder No.  HEMATOLOGY/LYMPH:  Abnormal bleeding No. Anemia No. Enlarged lymph nodes No. Past blood transfusion No. Swollen glands No. Blood Clots No. Using Blood Thinners No.  INFECTIOUS DISEASE:  HIV/AIDS No. Tuberculosis No . Hepatitis No. Sexually Transmitted Disease No. MRSA No.  GI PROCEDURE:  no Pacemaker/ AICD. no Artificial heart valves. no MI/heart attack. no Abnormal heart rhythm. no Angina. no CVA. Hypertension YES. no Hypotension. no Asthma, COPD. no Sleep apnea. no Seizure disorders. no Artificial joints. no Severe DJD. Diabetes YES. no Significant headaches. no Vertigo. no Depression/anxiety. no Abnormal bleeding. no Kidney Disease. no Liver disease. no Chance of pregnancy. no Blood transfusion. no Method of Birth Control. no Birth control pills.  GU/GYN:  Pregnant Now No. Trying to conceive No. Use Birth Control No. Heavy Periods No.      Vital Signs  Wt WC, Ht 67, Temp 98.1, Pulse sitting 96, BP sitting 122/74.   Examination  Gastroenterology:: GENERAL APPEARANCE: Well developed, obese,active distress, pleasant, no acute distress .  EYES: Lids and conjunctiva normal. Sclera normal, pupils equal and reactive .  ORAL CAVITY: Lips, teeth and gums are normal. Pharynx, tongue, mucosa normal .  SCLERA: anicteric .  NECK Full ROM, trachea midline, no thyromegaly or masses .   CARDIOVASCULAR PMI LS border. Normal RRR w/o murmers or gallops. No peripheral edema .  RESPIRATORY Breath sounds normal. Respiration even and unlabored .  ABDOMEN No masses palpated. Liver and spleen not palpated, normal. Bowel sounds normal, Abdomen not distended .  EXTREMITIES: No edema, pulses intact .  NEURO: decreased tone and strenght in lower extremities,ambulates in a wheel chair.  PSYCH: mood/affect normal .     Assessments   1. Early satiety - R68.81 (Primary)   2. Adverse effect of other opioids, initial encounter - T40.2X5A   3. Drug induced constipation - K59.03   Treatment  1. Early satiety  IMAGING: Esophagoscopy    Whitfield,Dia 10/12/2017 04:54:25 PM > spoke with Vickie-scheduled for 12/02/17-[facility] at 8:30am-prep instructions given.   Notes: Patient is concerned about weight loss and not being able to eat her full meals. Will proceed with a diagnostic EGD?PUD?malignancy.  Referral To: Reason:EGD w/propofol-spoke with [phone]    2. Drug induced constipation  Start Movantik Tablet, 25 MG, 1 tablet in the morning, Orally, Once a day, 90 days, 90 Tablet, Refills 1 Notes: She wants to take laxatives only on 3 days of the week(Monday, Tuesday and Thrusday) as she cannot walk and needs help with cleaning. She is not willing to take movantik everyday  as on other days if she has a BM she has no help with cleaning herself post defecation. She lives with her daughter, uses a catheter for urination. SHe wants to try taking movantik 25 mg 2 pills on those 3 days. She is not interested in taking linzess/amitiza/lactulose/miralax as she has tried all of that in the past. She needs her pain meds(narcotics) as she has pain all over from her MS. SHe understands that regular use of narcotics is the reason for her constipation. Advised patient to take high fiber diet and take at least 60 oz of water a day.

## 2017-11-30 ENCOUNTER — Encounter: Payer: Medicare Other | Admitting: Certified Nurse Midwife

## 2017-12-01 ENCOUNTER — Other Ambulatory Visit: Payer: Self-pay

## 2017-12-01 ENCOUNTER — Encounter (HOSPITAL_COMMUNITY): Payer: Self-pay | Admitting: *Deleted

## 2017-12-01 NOTE — Progress Notes (Signed)
Pt denies SOB, chest pain, and being under the care of a cardiologist. Pt denies having a stress test, echo and cardiac cath. Pt denies having a chest x ray and EKG within the last year. Pt denies recent labs. Pt made aware to stop taking Aspirin, vitamins, fish oil and herbal medications. Do not take any NSAIDs ie: Ibuprofen, Advil, Naproxen ( Aleve), Motrin, BC and Goody Powder or any medication containing Aspirin. Pt made aware to not take Glipizide or Metformin DOS. Pt made aware to check BG every 2 hours prior to arrival to hospital on DOS. Pt made aware to treat a BG < 70 with  4 ounces of apple juice, wait 15 minutes after intervention to recheck BG, if BG remains < 70, call Endoscopy Unit to speak with a nurse. Pt verbalized understanding of all pre-op instructions.

## 2017-12-02 ENCOUNTER — Encounter (HOSPITAL_COMMUNITY)
Admission: RE | Disposition: A | Discharge status: Home / Self Care | Payer: Self-pay | Source: Ambulatory Visit | Attending: Gastroenterology

## 2017-12-02 ENCOUNTER — Encounter (HOSPITAL_COMMUNITY): Payer: Self-pay | Admitting: *Deleted

## 2017-12-02 ENCOUNTER — Ambulatory Visit (HOSPITAL_COMMUNITY): Payer: Medicare Other | Admitting: Certified Registered Nurse Anesthetist

## 2017-12-02 ENCOUNTER — Ambulatory Visit (HOSPITAL_COMMUNITY)
Admission: RE | Admit: 2017-12-02 | Discharge: 2017-12-02 | Disposition: A | Discharge status: Home / Self Care | Payer: Medicare Other | Source: Ambulatory Visit | Attending: Gastroenterology | Admitting: Gastroenterology

## 2017-12-02 DIAGNOSIS — K2970 Gastritis, unspecified, without bleeding: Secondary | ICD-10-CM | POA: Diagnosis not present

## 2017-12-02 DIAGNOSIS — Z79899 Other long term (current) drug therapy: Secondary | ICD-10-CM | POA: Insufficient documentation

## 2017-12-02 DIAGNOSIS — D649 Anemia, unspecified: Secondary | ICD-10-CM | POA: Insufficient documentation

## 2017-12-02 DIAGNOSIS — K259 Gastric ulcer, unspecified as acute or chronic, without hemorrhage or perforation: Secondary | ICD-10-CM | POA: Diagnosis not present

## 2017-12-02 DIAGNOSIS — G35 Multiple sclerosis: Secondary | ICD-10-CM | POA: Insufficient documentation

## 2017-12-02 DIAGNOSIS — Z7984 Long term (current) use of oral hypoglycemic drugs: Secondary | ICD-10-CM | POA: Insufficient documentation

## 2017-12-02 DIAGNOSIS — I10 Essential (primary) hypertension: Secondary | ICD-10-CM | POA: Diagnosis not present

## 2017-12-02 DIAGNOSIS — E1140 Type 2 diabetes mellitus with diabetic neuropathy, unspecified: Secondary | ICD-10-CM | POA: Diagnosis not present

## 2017-12-02 DIAGNOSIS — E782 Mixed hyperlipidemia: Secondary | ICD-10-CM | POA: Diagnosis not present

## 2017-12-02 DIAGNOSIS — K2950 Unspecified chronic gastritis without bleeding: Secondary | ICD-10-CM | POA: Insufficient documentation

## 2017-12-02 DIAGNOSIS — E119 Type 2 diabetes mellitus without complications: Secondary | ICD-10-CM | POA: Diagnosis not present

## 2017-12-02 DIAGNOSIS — R6881 Early satiety: Secondary | ICD-10-CM | POA: Diagnosis not present

## 2017-12-02 DIAGNOSIS — K5900 Constipation, unspecified: Secondary | ICD-10-CM | POA: Insufficient documentation

## 2017-12-02 DIAGNOSIS — K3189 Other diseases of stomach and duodenum: Secondary | ICD-10-CM | POA: Diagnosis not present

## 2017-12-02 DIAGNOSIS — R1084 Generalized abdominal pain: Secondary | ICD-10-CM | POA: Diagnosis not present

## 2017-12-02 HISTORY — PX: ESOPHAGOGASTRODUODENOSCOPY: SHX5428

## 2017-12-02 SURGERY — EGD (ESOPHAGOGASTRODUODENOSCOPY)
Anesthesia: Monitor Anesthesia Care

## 2017-12-02 MED ORDER — SODIUM CHLORIDE 0.9 % IV SOLN: INTRAVENOUS | Status: DC

## 2017-12-02 MED ORDER — GLYCOPYRROLATE 0.2 MG/ML IV SOSY
PREFILLED_SYRINGE | INTRAVENOUS | Status: DC | PRN
  Administered 2017-12-02: .2 mg via INTRAVENOUS

## 2017-12-02 MED ORDER — LIDOCAINE 2% (20 MG/ML) 5 ML SYRINGE
INTRAMUSCULAR | Status: DC | PRN
  Administered 2017-12-02: 40 mg via INTRAVENOUS

## 2017-12-02 MED ORDER — PROPOFOL 500 MG/50ML IV EMUL
INTRAVENOUS | Status: DC | PRN
  Administered 2017-12-02: 100 ug/kg/min via INTRAVENOUS

## 2017-12-02 MED ORDER — LACTATED RINGERS IV SOLN
INTRAVENOUS | Status: DC
  Administered 2017-12-02: 1000 mL via INTRAVENOUS

## 2017-12-02 MED ORDER — PANTOPRAZOLE SODIUM 40 MG PO TBEC
40.0000 mg | DELAYED_RELEASE_TABLET | Freq: Every day | ORAL | 1 refills | Status: DC
Start: 2017-12-02 — End: 2018-11-21

## 2017-12-02 MED ORDER — PROPOFOL 10 MG/ML IV BOLUS
INTRAVENOUS | Status: DC | PRN
  Administered 2017-12-02 (×3): 20 mg via INTRAVENOUS

## 2017-12-02 MED ORDER — ONDANSETRON HCL 4 MG/2ML IJ SOLN
INTRAMUSCULAR | Status: DC | PRN
  Administered 2017-12-02: 4 mg via INTRAVENOUS

## 2017-12-02 NOTE — Interval H&P Note (Signed)
History and Physical Interval Note:  56/female for EGD for early satiety. 12/02/2017 8:19 AM  Robin Searing  has presented today for EGD, with the diagnosis of Early satiety  The various methods of treatment have been discussed with the patient and family. After consideration of risks, benefits and other options for treatment, the patient has consented to  Procedure(s): ESOPHAGOGASTRODUODENOSCOPY (EGD) (N/A) as a surgical intervention .  The patient's history has been reviewed, patient examined, no change in status, stable for surgery.  I have reviewed the patient's chart and labs.  Questions were answered to the patient's satisfaction.     Ronnette Juniper

## 2017-12-02 NOTE — Transfer of Care (Signed)
Immediate Anesthesia Transfer of Care Note  Patient: [name]  Procedure(s) Performed: ESOPHAGOGASTRODUODENOSCOPY (EGD) (N/A )  Patient Location: Endoscopy Unit  Anesthesia Type:MAC  Level of Consciousness: awake, alert  and oriented  Airway & Oxygen Therapy: Patient Spontanous Breathing  Post-op Assessment: Report given to RN and Post -op Vital signs reviewed and stable  Post vital signs: Reviewed and stable  Last Vitals:  Vitals:   12/02/17 0805  BP: (!) 143/67  Pulse: 72  Resp: 12  Temp: 36.8 C  SpO2: 97%    Last Pain:  Vitals:   12/02/17 0805  TempSrc: Oral  PainSc: 3       Patients Stated Pain Goal: 3 (76/14/70 [id number])  Complications: No apparent anesthesia complications

## 2017-12-02 NOTE — Anesthesia Procedure Notes (Signed)
Procedure Name: MAC Performed by: Horris Speros B, CRNA Pre-anesthesia Checklist: Patient identified, Emergency Drugs available, Suction available, Patient being monitored and Timeout performed Patient Re-evaluated:Patient Re-evaluated prior to induction Oxygen Delivery Method: Nasal cannula Airway Equipment and Method: Bite block Placement Confirmation: positive ETCO2 Dental Injury: Teeth and Oropharynx as per pre-operative assessment        

## 2017-12-02 NOTE — Op Note (Signed)
[facility] Patient Name: [name] Procedure Date : 12/02/2017 MRN: [id number] Attending MD: Ronnette Juniper , MD Date of Birth: 1961-08-20 CSN: [id number] Age: 56 Admit Type: Inpatient Procedure:                Upper GI endoscopy Indications:              Generalized abdominal pain, Early satiety Providers:                Ronnette Juniper, MD, Cleda Daub, RN, Cherylynn Ridges,                            Technician Referring MD:              Medicines:                Monitored Anesthesia Care Complications:            No immediate complications. Estimated Blood Loss:     Estimated blood loss: none. Procedure:                Pre-Anesthesia Assessment:                           - Prior to the procedure, a History and Physical                            was performed, and patient medications and                            allergies were reviewed. The patient's tolerance of                            previous anesthesia was also reviewed. The risks                            and benefits of the procedure and the sedation                            options and risks were discussed with the patient.                            All questions were answered, and informed consent                            was obtained. Prior Anticoagulants: The patient has                            taken no previous anticoagulant or antiplatelet                            agents. ASA Grade Assessment: II - A patient with                            mild systemic disease. After reviewing the risks  and benefits, the patient was deemed in                            satisfactory condition to undergo the procedure.                           After obtaining informed consent, the endoscope was                            passed under direct vision. Throughout the                            procedure, the patient's blood pressure, pulse, and                            oxygen saturations  were monitored continuously. The                            EG-[id number] ([id number]) scope was introduced through the                            mouth, and advanced to the second part of duodenum.                            The upper GI endoscopy was accomplished without                            difficulty. The patient tolerated the procedure                            well. Scope In: Scope Out: Findings:      The examined esophagus was normal.      The Z-line was regular and was found 40 cm from the incisors.      A few dispersed, diminutive non-bleeding erosions were found in the       gastric body. There were no stigmata of recent bleeding.      Few non-bleeding superficial gastric ulcers were found in the gastric       antrum. The largest lesion was 4 mm in largest dimension. Biopsies were       taken with a cold forceps for Helicobacter pylori testing.      The cardia and gastric fundus were normal on retroflexion.      The examined duodenum was normal. Biopsies for histology were taken with       a cold forceps for evaluation of celiac disease. Impression:               - Normal esophagus.                           - Z-line regular, 40 cm from the incisors.                           - Non-bleeding erosive gastropathy.                           -  Non-bleeding gastric ulcers. Biopsied.                           - Normal examined duodenum. Biopsied. Moderate Sedation:      Patient did not receive moderate sedation for this procedure, but       instead received monitored anesthesia care. Recommendation:           - Patient has a contact number available for                            emergencies. The signs and symptoms of potential                            delayed complications were discussed with the                            patient. Return to normal activities tomorrow.                            Written discharge instructions were provided to the                             patient.                           - Resume regular diet.                           - Continue present medications.                           - Await pathology results.                           - Use Protonix (pantoprazole) 40 mg PO daily for 2                            weeks.                           - Avoid NSAIDs. Procedure Code(s):        --- Professional ---                           [phone], Esophagogastroduodenoscopy, flexible,                            transoral; with biopsy, single or multiple Diagnosis Code(s):        --- Professional ---                           K31.89, Other diseases of stomach and duodenum                           K25.9, Gastric ulcer, unspecified as acute or  chronic, without hemorrhage or perforation                           R10.84, Generalized abdominal pain                           R68.81, Early satiety CPT copyright 2016 American Medical Association. All rights reserved. The codes documented in this report are preliminary and upon coder review may  be revised to meet current compliance requirements. Ronnette Juniper, MD 12/02/2017 8:45:25 AM This report has been signed electronically. Number of Addenda: 0

## 2017-12-02 NOTE — Discharge Instructions (Signed)

## 2017-12-02 NOTE — Brief Op Note (Signed)
12/02/2017  8:45 AM  PATIENT:  Robin Searing  56 y.o. female  PRE-OPERATIVE DIAGNOSIS:  Early satiety  POST-OPERATIVE DIAGNOSIS:  mild gastritis superficial ulcers  PROCEDURE:  Procedure(s): ESOPHAGOGASTRODUODENOSCOPY (EGD) (N/A)  SURGEON:  Surgeon(s) and Role:    Ronnette Juniper, MD - Primary  PHYSICIAN ASSISTANT:   ASSISTANTS: Cleda Daub, RN, Janie Billups, Tech   ANESTHESIA:   MAC  EBL:  None  BLOOD ADMINISTERED:none  DRAINS: none   LOCAL MEDICATIONS USED:  NONE  SPECIMEN:  Biopsy / Limited Resection  DISPOSITION OF SPECIMEN:  PATHOLOGY  COUNTS:  YES  TOURNIQUET:  * No tourniquets in log *  DICTATION: .Dragon Dictation  PLAN OF CARE: Discharge to home after PACU  PATIENT DISPOSITION:  PACU - hemodynamically stable.   Delay start of Pharmacological VTE agent (>24hrs) due to surgical blood loss or risk of bleeding: no

## 2017-12-02 NOTE — Anesthesia Postprocedure Evaluation (Signed)
Anesthesia Post Note  Patient: [name]  Procedure(s) Performed: ESOPHAGOGASTRODUODENOSCOPY (EGD) (N/A )     Patient location during evaluation: Endoscopy Anesthesia Type: MAC Level of consciousness: awake and alert Pain management: pain level controlled Vital Signs Assessment: post-procedure vital signs reviewed and stable Respiratory status: spontaneous breathing and respiratory function stable Cardiovascular status: stable Postop Assessment: no apparent nausea or vomiting Anesthetic complications: no    Last Vitals:  Vitals:   12/02/17 0900 12/02/17 0910  BP: 124/72 (!) 114/59  Pulse: 88 71  Resp: 12 13  Temp:    SpO2: 100% 95%    Last Pain:  Vitals:   12/02/17 0849  TempSrc: Oral  PainSc:                  Quinlin Conant DANIEL

## 2017-12-02 NOTE — Anesthesia Preprocedure Evaluation (Addendum)
Anesthesia Evaluation  Patient identified by MRN, date of birth, ID band Patient awake    Reviewed: Allergy & Precautions, NPO status , Patient's Chart, lab work & pertinent test results  History of Anesthesia Complications Negative for: history of anesthetic complications  Airway Mallampati: II  TM Distance: >3 FB Neck ROM: Full    Dental no notable dental hx. (+) Dental Advisory Given   Pulmonary neg pulmonary ROS,    Pulmonary exam normal        Cardiovascular hypertension, negative cardio ROS Normal cardiovascular exam     Neuro/Psych MS negative psych ROS   GI/Hepatic negative GI ROS, Neg liver ROS,   Endo/Other  negative endocrine ROSdiabetes  Renal/GU negative Renal ROS  negative genitourinary   Musculoskeletal negative musculoskeletal ROS (+)   Abdominal   Peds negative pediatric ROS (+)  Hematology negative hematology ROS (+)   Anesthesia Other Findings   Reproductive/Obstetrics negative OB ROS                            Anesthesia Physical Anesthesia Plan  ASA: II  Anesthesia Plan: MAC   Post-op Pain Management:    Induction: Intravenous  PONV Risk Score and Plan: 2 and Ondansetron and Propofol infusion  Airway Management Planned: Natural Airway and Simple Face Mask  Additional Equipment:   Intra-op Plan:   Post-operative Plan:   Informed Consent: I have reviewed the patients History and Physical, chart, labs and discussed the procedure including the risks, benefits and alternatives for the proposed anesthesia with the patient or authorized representative who has indicated his/her understanding and acceptance.   Dental advisory given  Plan Discussed with: Anesthesiologist and CRNA  Anesthesia Plan Comments:         Anesthesia Quick Evaluation

## 2017-12-02 NOTE — Op Note (Signed)
EGD was performed for early satiety and generalized abdominal pain.   The entire esophagus appeared unremarkable. Z line was normal at 40 cm from insertion. Few, dispersed, diminutive, nonbleeding erosions were found in the gastric body. Few nonbleeding superficial gastric ulcers were found in the antrum, biopsies taken for H. Pylori. The duodenal bulb and rest of the duodenum appeared unremarkable, biopsies taken to rule out celiac disease.   Recommendation: Resume regular diet. PPI such as Protonix once a day for 2 weeks. Avoid NSAIDs. Treat H. pylori if found, as an outpatient.   Ronnette Juniper, M.D.

## 2017-12-04 ENCOUNTER — Encounter (HOSPITAL_COMMUNITY): Payer: Self-pay | Admitting: Gastroenterology

## 2017-12-14 DIAGNOSIS — Z7984 Long term (current) use of oral hypoglycemic drugs: Secondary | ICD-10-CM | POA: Diagnosis not present

## 2017-12-14 DIAGNOSIS — E782 Mixed hyperlipidemia: Secondary | ICD-10-CM | POA: Diagnosis not present

## 2017-12-14 DIAGNOSIS — E1142 Type 2 diabetes mellitus with diabetic polyneuropathy: Secondary | ICD-10-CM | POA: Diagnosis not present

## 2017-12-14 DIAGNOSIS — I10 Essential (primary) hypertension: Secondary | ICD-10-CM | POA: Diagnosis not present

## 2017-12-15 DIAGNOSIS — N318 Other neuromuscular dysfunction of bladder: Secondary | ICD-10-CM | POA: Diagnosis not present

## 2017-12-16 ENCOUNTER — Encounter (HOSPITAL_BASED_OUTPATIENT_CLINIC_OR_DEPARTMENT_OTHER): Payer: Medicare Other | Attending: Internal Medicine

## 2017-12-16 DIAGNOSIS — L89313 Pressure ulcer of right buttock, stage 3: Secondary | ICD-10-CM | POA: Insufficient documentation

## 2017-12-16 DIAGNOSIS — Z993 Dependence on wheelchair: Secondary | ICD-10-CM | POA: Insufficient documentation

## 2017-12-16 DIAGNOSIS — G35 Multiple sclerosis: Secondary | ICD-10-CM | POA: Diagnosis not present

## 2017-12-16 DIAGNOSIS — E1140 Type 2 diabetes mellitus with diabetic neuropathy, unspecified: Secondary | ICD-10-CM | POA: Insufficient documentation

## 2017-12-16 DIAGNOSIS — I10 Essential (primary) hypertension: Secondary | ICD-10-CM | POA: Insufficient documentation

## 2017-12-27 ENCOUNTER — Other Ambulatory Visit: Payer: Self-pay | Admitting: Family Medicine

## 2017-12-28 NOTE — Telephone Encounter (Signed)
Medication refill request.

## 2017-12-29 NOTE — Telephone Encounter (Signed)
Pt last seen 08/17/17 and next vist is 01/04/18 Pls advise

## 2018-01-04 ENCOUNTER — Telehealth (HOSPITAL_COMMUNITY): Payer: Self-pay | Admitting: Family Medicine

## 2018-01-04 ENCOUNTER — Other Ambulatory Visit: Payer: Self-pay

## 2018-01-04 ENCOUNTER — Ambulatory Visit (INDEPENDENT_AMBULATORY_CARE_PROVIDER_SITE_OTHER): Payer: Medicare Other | Admitting: Family Medicine

## 2018-01-04 ENCOUNTER — Encounter: Payer: Self-pay | Admitting: Family Medicine

## 2018-01-04 VITALS — BP 130/77 | HR 88 | Temp 98.7°F | Resp 17 | Ht 67.0 in | Wt 223.0 lb

## 2018-01-04 DIAGNOSIS — R5381 Other malaise: Secondary | ICD-10-CM

## 2018-01-04 DIAGNOSIS — E118 Type 2 diabetes mellitus with unspecified complications: Secondary | ICD-10-CM

## 2018-01-04 DIAGNOSIS — Z7409 Other reduced mobility: Secondary | ICD-10-CM

## 2018-01-04 DIAGNOSIS — Z79891 Long term (current) use of opiate analgesic: Secondary | ICD-10-CM | POA: Diagnosis not present

## 2018-01-04 LAB — POCT GLYCOSYLATED HEMOGLOBIN (HGB A1C): Hemoglobin A1C: 5.9

## 2018-01-04 MED ORDER — OXYCODONE-ACETAMINOPHEN 10-325 MG PO TABS: 1.0000 | ORAL_TABLET | Freq: Three times a day (TID) | ORAL | 0 refills | Status: DC | PRN

## 2018-01-04 MED ORDER — OXYCODONE HCL ER 20 MG PO T12A: 20.0000 mg | EXTENDED_RELEASE_TABLET | Freq: Three times a day (TID) | ORAL | 0 refills | Status: DC | PRN

## 2018-01-04 MED ORDER — NYSTATIN 100000 UNIT/GM EX OINT: TOPICAL_OINTMENT | CUTANEOUS | 6 refills | Status: DC

## 2018-01-04 NOTE — Telephone Encounter (Signed)
Called and left message for call back with instructions to let us know what is needed for chair lift

## 2018-01-04 NOTE — Patient Instructions (Addendum)
IF you received an x-ray today, you will receive an invoice from [facility]. Please contact [facility] at [phone] with questions or concerns regarding your invoice.   IF you received labwork today, you will receive an invoice from [facility]. Please contact [facility] at [phone] with questions or concerns regarding your invoice.   Our billing staff will not be able to assist you with questions regarding bills from these companies.  You will be contacted with the lab results as soon as they are available. The fastest way to get your results is to activate your My Chart account. Instructions are located on the last page of this paperwork. If you have not heard from [place] regarding the results in 2 weeks, please contact this office.     Hypoglycemia Hypoglycemia occurs when the level of sugar (glucose) in the blood is too low. Glucose is a type of sugar that provides the body's main source of energy. Certain hormones (insulin and glucagon) control the level of glucose in the blood. Insulin lowers blood glucose, and glucagon increases blood glucose. Hypoglycemia can result from having too much insulin in the bloodstream, or from not eating enough food that contains glucose. Hypoglycemia can happen in people who do or do not have diabetes. It can develop quickly, and it can be a medical emergency. What are the causes? Hypoglycemia occurs most often in people who have diabetes. If you have diabetes, hypoglycemia may be caused by:  Diabetes medicine.  Not eating enough, or not eating often enough.  Increased physical activity.  Drinking alcohol, especially when you have not eaten recently.  If you do not have diabetes, hypoglycemia may be caused by:  A tumor in the pancreas. The pancreas is the organ that makes insulin.  Not eating enough, or not eating for long periods at a time (fasting).  Severe infection or illness that affects the liver, heart, or  kidneys.  Certain medicines.  You may also have reactive hypoglycemia. This condition causes hypoglycemia within 4 hours of eating a meal. This may occur after having stomach surgery. Sometimes, the cause of reactive hypoglycemia is not known. What increases the risk? Hypoglycemia is more likely to develop in:  People who have diabetes and take medicines to lower blood glucose.  People who abuse alcohol.  People who have a severe illness.  What are the signs or symptoms? Hypoglycemia may not cause any symptoms. If you have symptoms, they may include:  Hunger.  Anxiety.  Sweating and feeling clammy.  Confusion.  Dizziness or feeling light-headed.  Sleepiness.  Nausea.  Increased heart rate.  Headache.  Blurry vision.  Seizure.  Nightmares.  Tingling or numbness around the mouth, lips, or tongue.  A change in speech.  Decreased ability to concentrate.  A change in coordination.  Restless sleep.  Tremors or shakes.  Fainting.  Irritability.  How is this diagnosed? Hypoglycemia is diagnosed with a blood test to measure your blood glucose level. This blood test is done while you are having symptoms. Your health care provider may also do a physical exam and review your medical history. If you do not have diabetes, other tests may be done to find the cause of your hypoglycemia. How is this treated? This condition can often be treated by immediately eating or drinking something that contains glucose, such as:  3-4 sugar tablets (glucose pills).  Glucose gel, 15-gram tube.  Fruit juice, 4 oz (120 mL).  Regular soda (not diet soda), 4 oz (120  120 mL).  Low-fat milk, 4 oz (120 mL).  Several pieces of hard candy.  Sugar or honey, 1 Tbsp.  Treating Hypoglycemia If You Have Diabetes  If you are alert and able to swallow safely, follow the 15:15 rule:  Take 15 grams of a rapid-acting carbohydrate. Rapid-acting options include: ? 1 tube of glucose  gel. ? 3 glucose pills. ? 6-8 pieces of hard candy. ? 4 oz (120 mL) of fruit juice. ? 4 oz (120 ml) of regular (not diet) soda.  Check your blood glucose 15 minutes after you take the carbohydrate.  If the repeat blood glucose level is still at or below 70 mg/dL (3.9 mmol/L), take 15 grams of a carbohydrate again.  If your blood glucose level does not increase above 70 mg/dL (3.9 mmol/L) after 3 tries, seek emergency medical care.  After your blood glucose level returns to normal, eat a meal or a snack within 1 hour.  Treating Severe Hypoglycemia Severe hypoglycemia is when your blood glucose level is at or below 54 mg/dL (3 mmol/L). Severe hypoglycemia is an emergency. Do not wait to see if the symptoms will go away. Get medical help right away. Call your local emergency services (911 in the [place] Do not drive yourself to the hospital. If you have severe hypoglycemia and you cannot eat or drink, you may need an injection of glucagon. A family member or close friend should learn how to check your blood glucose and how to give you a glucagon injection. Ask your health care provider if you need to have an emergency glucagon injection kit available. Severe hypoglycemia may need to be treated in a hospital. The treatment may include getting glucose through an IV tube. You may also need treatment for the cause of your hypoglycemia. Follow these instructions at home: General instructions  Avoid any diets that cause you to not eat enough food. Talk with your health care provider before you start any new diet.  Take over-the-counter and prescription medicines only as told by your health care provider.  Limit alcohol intake to no more than 1 drink per day for nonpregnant women and 2 drinks per day for men. One drink equals 12 oz of beer, 5 oz of wine, or 1 oz of hard liquor.  Keep all follow-up visits as told by your health care provider. This is important. If You Have Diabetes:   Make sure  you know the symptoms of hypoglycemia.  Always have a rapid-acting carbohydrate snack with you to treat low blood sugar.  Follow your diabetes management plan, as told by your health care provider. Make sure you: ? Take your medicines as directed. ? Follow your exercise plan. ? Follow your meal plan. Eat on time, and do not skip meals. ? Check your blood glucose as often as directed. Make sure to check your blood glucose before and after exercise. If you exercise longer or in a different way than usual, check your blood glucose more often. ? Follow your sick day plan whenever you cannot eat or drink normally. Make this plan in advance with your health care provider.  Share your diabetes management plan with people in your workplace, school, and household.  Check your urine for ketones when you are ill and as told by your health care provider.  Carry a medical alert card or wear medical alert jewelry. If You Have Reactive Hypoglycemia or Low Blood Sugar From Other Causes:  Monitor your blood glucose as told by your health care   Follow instructions from your health care provider about eating or drinking restrictions. Contact a health care provider if:  You have problems keeping your blood glucose in your target range.  You have frequent episodes of hypoglycemia. Get help right away if:  You continue to have hypoglycemia symptoms after eating or drinking something containing glucose.  Your blood glucose is at or below 54 mg/dL (3 mmol/L).  You have a seizure.  You faint. These symptoms may represent a serious problem that is an emergency. Do not wait to see if the symptoms will go away. Get medical help right away. Call your local emergency services (911 in the [place] Do not drive yourself to the hospital. This information is not intended to replace advice given to you by your health care provider. Make sure you discuss any questions you have with your health care  provider. Document Released: 11/22/2005 Document Revised: 05/05/2016 Document Reviewed: 12/26/2015 Elsevier Interactive Patient Education  [employer].

## 2018-01-04 NOTE — Progress Notes (Signed)
Chief Complaint  Patient presents with  . Medication Refill    oxycodone and nystatin ointment    HPI   Diabetes Patient reports that she is doing well with her blood glucose Her fasting is usually 130-140 fasting She is taking her medication metformin and glipizide She does not know her weight  She feels like she is losing weight by changing what she is eating Her last weight was checked 2 years ago Lab Results  Component Value Date   HGBA1C 5.9 01/04/2018    She saw Endocrinology Dr. Amalia Greenhouse on 12/14/17 Her medications were refilled without changes  Chronic opiate use She is using opitiates for her chronic pain and is opiate dependent She saw GI who addressed her opiate induced constipation and her movantik was increased She reports that it has helped.   Wheelchair bound She reports that her decubitus ulcers come and go  She states that she gets wound care as needed She would like to get a lift to help her with showers but she has not gotten that authorized as yet    Past Medical History:  Diagnosis Date  . Arthritis   . Decubitus ulcers   . Diabetes mellitus without complication ([place])   . Hypertension   . MS (multiple sclerosis) ([place])     Current Outpatient Medications  Medication Sig Dispense Refill  . Adhesive Tape (PAPER TAPE 1"[id number]) TAPE Apply to dressings as needed 4 each 6  . atorvastatin (LIPITOR) 10 MG tablet Take 10 mg by mouth daily.     . baclofen (LIORESAL) 20 MG tablet TAKE 1 TABLET BY MOUTH FOUR TIMES A DAY**OFFICE VISIT NEEDED** 120 tablet 1  . Catheters (DOVER UNIVERSAL FOLEY TRAY) KIT Use foley tray kit to change catheter every 3 weeks or as needed 15 each 1  . Catheters (FOLEY CATHETER 2-WAY) MISC Use to aspirate urine suprapubic 15 each 1  . Cholecalciferol (D3-1000) 1000 units capsule Take 2,000 Units by mouth daily.     . Control Gel Formula Dressing (DUODERM CGF DRESSING) MISC Apply 1 each topically daily as needed. 5 each 11  .  cyanocobalamin 500 MCG tablet Take 500 mcg by mouth daily.    . Disposable Gloves MISC Use disposable gloves to change catheter as needed 200 each 11  . furosemide (LASIX) 20 MG tablet Take 1 tablet (20 mg total) by mouth daily. Office visit needed 240 tablet 0  . glipiZIDE (GLUCOTROL) 5 MG tablet Take 1 tablet (5 mg total) by mouth daily before lunch. 90 tablet 3  . Hydroactive Dressings (DUODERM HYDROACTIVE) PSTE Apply to pressure wounds as needed 60 g 11  . hydrochlorothiazide (HYDRODIURIL) 25 MG tablet Take 1 tablet (25 mg total) by mouth every morning. Office visit needed 30 tablet 0  . Incontinence Supply Disposable (TENA FLEX 16 PLUS) MISC Use daily as needed 30 each 11  . Incontinence Supply Disposable (TENA PROTECT UNDERWEAR PLS/XLG) MISC Use for incontinence. 30 each 11  . Interferon Beta-1b (BETASERON) 0.3 MG KIT injection Inject 0.3 mg into the skin every other day.     . lidocaine (XYLOCAINE) 2 % jelly USE when need with dressings  1  . metFORMIN (GLUCOPHAGE) 1000 MG tablet Take 1,000 mg by mouth 2 (two) times daily with a meal.    . Methenamine-Sodium Salicylate (AZO URINARY TRACT DEFENSE) 162-162.5 MG TABS Take as instructed    . MOVANTIK 25 MG TABS tablet Take 25 mg by mouth every morning.  4  . NON FORMULARY Power  wheelchair repairs  Diagnosis code z99.3 1 each 0  . nystatin ointment (MYCOSTATIN) Apply to affected area twice daily as needed for irritation 30 g 6  . Ostomy Supplies (NEW IMAGE SKIN/FLANGE/TAPE) MISC 1 application by Does not apply route daily as needed. 30 each 11  . oxyCODONE (OXYCONTIN) 20 mg 12 hr tablet Take 1 tablet (20 mg total) by mouth 3 (three) times daily as needed (pain). 90 tablet 0  . oxyCODONE-acetaminophen (PERCOCET) 10-325 MG tablet Take 1 tablet by mouth every 8 (eight) hours as needed for pain. 90 tablet 0  . pantoprazole (PROTONIX) 40 MG tablet Take 1 tablet (40 mg total) by mouth daily. 30 tablet 1  . potassium chloride (K-DUR,KLOR-CON) 10 MEQ  tablet TAKE 3 TABLETS BY MOUTH TWICE DAILY 180 tablet 0  . Syringe, Disposable, (B-D SYRINGE LUER-LOK 30CC) 30 ML MISC Use to change catheter 15 each 0  . Wound Dressings (GRX HYDROGEL GAUZE 4X4) PADS Use gauze to clean wound daily as needed 200 each 11   No current facility-administered medications for this visit.     Allergies: No Known Allergies  Past Surgical History:  Procedure Laterality Date  . ESOPHAGOGASTRODUODENOSCOPY N/A 12/02/2017   Procedure: ESOPHAGOGASTRODUODENOSCOPY (EGD);  Surgeon: Ronnette Juniper, MD;  Location: [place];  Service: Gastroenterology;  Laterality: N/A;  . SUPRAPUBIC CATHETER INSERTION  2009  . TONSILLECTOMY    . TUBAL LIGATION      Social History   Socioeconomic History  . Marital status: Single    Spouse name: None  . Number of children: None  . Years of education: None  . Highest education level: None  Social Needs  . Financial resource strain: None  . Food insecurity - worry: None  . Food insecurity - inability: None  . Transportation needs - medical: None  . Transportation needs - non-medical: None  Occupational History  . None  Tobacco Use  . Smoking status: Never Smoker  . Smokeless tobacco: Never Used  Substance and Sexual Activity  . Alcohol use: No  . Drug use: No  . Sexual activity: None  Other Topics Concern  . None  Social History Narrative  . None    Family History  Problem Relation Age of Onset  . Cancer Mother   . Hypertension Mother   . Heart disease Father   . Diabetes Father   . Hypertension Sister   . Hypertension Brother      ROS Review of Systems See HPI [place]: No fevers or chills No malaise No diaphoresis Skin: No rash or itching Eyes: no blurry vision, no double vision GU: no dysuria or hematuria Neuro: no dizziness or headaches  all others reviewed and negative   Objective: Vitals:   01/04/18 1353  BP: 130/77  Pulse: 88  Resp: 17  Temp: 98.7 F (37.1 C)  TempSrc: Oral  SpO2:  98%  Weight: 223 lb (101.2 kg)  Height: '5\' 7"'$  (1.702 m)    Physical Exam  Constitutional: She is oriented to person, place, and time. She appears well-developed and well-nourished.  Wheelchair bound  HENT:  Head: Normocephalic and atraumatic.  Eyes: Conjunctivae and EOM are normal.  Neck: Normal range of motion.  Cardiovascular: Normal rate, regular rhythm and normal heart sounds.  No murmur heard. Pulmonary/Chest: Effort normal and breath sounds normal. No stridor. No respiratory distress.  Neurological: She is alert and oriented to person, place, and time.  Skin: Skin is warm. Capillary refill takes less than 2 seconds.  Psychiatric: She has  a normal mood and affect. Her behavior is normal. Judgment and thought content normal.     Assessment and Plan [name] was seen today for medication refill.  Diagnoses and all orders for this visit:  Type 2 diabetes mellitus with complication, without long-term current use of insulin ([place])- diabetes well controlled Discussed hypoglycemia and how to reduce meds should that develop -     POCT glycosylated hemoglobin (Hb A1C)  Does not transfer from chair to wheelchair Physical deconditioning -  [place] for [phone] ext [phone]  Chronically on opiate therapy- refilled pain meds, opiate use appropriate   Other orders -     oxyCODONE-acetaminophen (PERCOCET) 10-325 MG tablet; Take 1 tablet by mouth every 8 (eight) hours as needed for pain. -     oxyCODONE (OXYCONTIN) 20 mg 12 hr tablet; Take 1 tablet (20 mg total) by mouth 3 (three) times daily as needed (pain). -     nystatin ointment (MYCOSTATIN); Apply to affected area twice daily as needed for irritation     [name] A Nolon Rod

## 2018-01-05 DIAGNOSIS — N318 Other neuromuscular dysfunction of bladder: Secondary | ICD-10-CM | POA: Diagnosis not present

## 2018-01-05 DIAGNOSIS — Z993 Dependence on wheelchair: Secondary | ICD-10-CM | POA: Diagnosis not present

## 2018-01-05 DIAGNOSIS — E1140 Type 2 diabetes mellitus with diabetic neuropathy, unspecified: Secondary | ICD-10-CM | POA: Diagnosis not present

## 2018-01-05 DIAGNOSIS — G35 Multiple sclerosis: Secondary | ICD-10-CM | POA: Diagnosis not present

## 2018-01-05 DIAGNOSIS — L89313 Pressure ulcer of right buttock, stage 3: Secondary | ICD-10-CM | POA: Diagnosis not present

## 2018-01-05 DIAGNOSIS — I10 Essential (primary) hypertension: Secondary | ICD-10-CM | POA: Diagnosis not present

## 2018-01-12 ENCOUNTER — Encounter: Payer: Medicare Other | Admitting: Certified Nurse Midwife

## 2018-01-18 ENCOUNTER — Encounter: Payer: Self-pay | Admitting: Podiatry

## 2018-01-18 ENCOUNTER — Ambulatory Visit (INDEPENDENT_AMBULATORY_CARE_PROVIDER_SITE_OTHER): Payer: Medicare Other | Admitting: Podiatry

## 2018-01-18 DIAGNOSIS — L84 Corns and callosities: Secondary | ICD-10-CM | POA: Diagnosis not present

## 2018-01-18 DIAGNOSIS — E0842 Diabetes mellitus due to underlying condition with diabetic polyneuropathy: Secondary | ICD-10-CM | POA: Diagnosis not present

## 2018-01-18 DIAGNOSIS — M79676 Pain in unspecified toe(s): Secondary | ICD-10-CM | POA: Diagnosis not present

## 2018-01-18 DIAGNOSIS — B351 Tinea unguium: Secondary | ICD-10-CM

## 2018-01-20 ENCOUNTER — Encounter (HOSPITAL_BASED_OUTPATIENT_CLINIC_OR_DEPARTMENT_OTHER): Payer: Medicare Other | Attending: Internal Medicine

## 2018-01-20 DIAGNOSIS — G35 Multiple sclerosis: Secondary | ICD-10-CM | POA: Insufficient documentation

## 2018-01-20 DIAGNOSIS — I10 Essential (primary) hypertension: Secondary | ICD-10-CM | POA: Insufficient documentation

## 2018-01-20 DIAGNOSIS — E1140 Type 2 diabetes mellitus with diabetic neuropathy, unspecified: Secondary | ICD-10-CM | POA: Diagnosis not present

## 2018-01-20 DIAGNOSIS — L89313 Pressure ulcer of right buttock, stage 3: Secondary | ICD-10-CM | POA: Insufficient documentation

## 2018-01-22 NOTE — Progress Notes (Signed)
    Subjective: Patient is a 57 y.o. female with PMHx of DM presenting to the office today with a chief complaint of painful callus lesions to the bilateral feet that have been present for several months.  Patient also complains of elongated, thickened nails that cause pain while ambulating in shoes. Patient is unable to trim their own nails. Patient presents today for further treatment and evaluation.   Past Medical History:  Diagnosis Date  . Arthritis   . Decubitus ulcers   . Diabetes mellitus without complication ([place])   . Hypertension   . MS (multiple sclerosis) ([place])     Objective:  Physical Exam General: Alert and oriented x3 in no acute distress  Dermatology: Hyperkeratotic lesions present on the bilateral feet x 2. Pain on palpation with a central nucleated core noted. Skin is warm, dry and supple bilateral lower extremities. Negative for open lesions or macerations. Nails are tender, long, thickened and dystrophic with subungual debris, consistent with onychomycosis, 1-5 bilateral. No signs of infection noted.  Vascular: Palpable pedal pulses bilaterally. No edema or erythema noted. Capillary refill within normal limits.  Neurological: Epicritic and protective threshold grossly intact bilaterally.   Musculoskeletal Exam: Pain on palpation at the keratotic lesion noted. Range of motion within normal limits bilateral. Muscle strength 5/5 in all groups bilateral.  Assessment: 1. Onychodystrophic nails 1-5 bilateral with hyperkeratosis of nails.  2. Onychomycosis of nail due to dermatophyte bilateral 3. Pre-ulcerative callus lesions to the bilateral feet x 2   Plan of Care:  #1 Patient evaluated. #2 Excisional debridement of keratoic lesion using a chisel blade was performed without incident.  #3 Dressed with light dressing. #4 Mechanical debridement of nails 1-5 bilaterally performed using a nail nipper. Filed with dremel without incident.  #5 Patient is to return to the  clinic in 3 months.   Edrick Kins, DPM [facility]  Dr. Edrick Kins, [address]                                        [place], [place] [address]                Office [phone]  Fax [phone]

## 2018-01-26 DIAGNOSIS — N318 Other neuromuscular dysfunction of bladder: Secondary | ICD-10-CM | POA: Diagnosis not present

## 2018-02-02 ENCOUNTER — Telehealth: Payer: Self-pay | Admitting: Neurology

## 2018-02-02 MED ORDER — INTERFERON BETA-1B 0.3 MG ~~LOC~~ KIT: 0.3000 mg | PACK | SUBCUTANEOUS | 3 refills | Status: DC

## 2018-02-02 NOTE — Telephone Encounter (Signed)
Betaseron escribed to [facility] as requested/fim

## 2018-02-02 NOTE — Telephone Encounter (Signed)
Pt requesting a refill for Interferon Beta-1b (BETASERON) 0.3 MG KIT injection be sent to [facility]

## 2018-02-09 ENCOUNTER — Other Ambulatory Visit: Payer: Self-pay

## 2018-02-09 NOTE — Telephone Encounter (Signed)
Patient is requesting a refill of the following medications: Requested Prescriptions   Pending Prescriptions Disp Refills  . oxyCODONE (OXYCONTIN) 20 mg 12 hr tablet 90 tablet 0    Sig: Take 1 tablet (20 mg total) by mouth 3 (three) times daily as needed (pain).    Date of patient request: 02/07/2018 Last office visit: 01/04/2018 Date of last refill: 01/04/2018 Last refill amount: #90, no refills       Follow up time period per chart: Return in about 6 months (around 07/04/2018).

## 2018-02-09 NOTE — Telephone Encounter (Signed)
Copied from [place]. Topic: Quick Communication - Rx Refill/Question >> Feb 07, 2018  9:18 AM Oneta Rack wrote: Relation to pt: self  Call back number: [phone] Pharmacy: [facility], [place], [place] [phone] (Phone) [phone] (Fax)    Reason for call:  Patient requesting  oxyCODONE (OXYCONTIN) 20 mg 12 hr tablet refill, patient confirmed she contacted pharmacy, patient aware please allow 48 to 72 hour turn around, please advise >> Feb 07, 2018  9:20 AM Oneta Rack wrote: Relation to pt: self  Call back number: [phone] Pharmacy: [facility], [place], [place] [phone] (Phone) [phone] (Fax)    Reason for call:  Patient requesting  oxyCODONE (OXYCONTIN) 20 mg 12 hr tablet refill, patient confirmed she contacted pharmacy, patient aware please allow 48 to 72 hour turn around, please advise

## 2018-02-13 NOTE — Telephone Encounter (Signed)
Pricilla Handler Communication 02/13/2018 02:29 PM    Reason for CRM: Patient called regarding the medication refill request she placed on last Tuesday 02/07/2018. Patient still has no medication. oxyCODONE (OXYCONTIN) 20 mg 12 hr tablet. Patient contacted her pharmacy, but they had nothing. Patient needs medication ASAP. Patient last had medication on Friday 02/10/2018. Patient's preferred pharmacy is [facility], [place], [place]!!!

## 2018-02-15 ENCOUNTER — Telehealth: Payer: Self-pay | Admitting: Family Medicine

## 2018-02-15 NOTE — Telephone Encounter (Signed)
Phone call to patient. Patient advised refill has been sent to provider to approve, just waiting on provider response. She is appreciative of call.  Provider, please refill or deny patient medication request- see RX refills.

## 2018-02-15 NOTE — Telephone Encounter (Signed)
>>   Feb 15, 2018  9:37 AM [name], NT wrote: Pt calling to check status on pain medication being called in.

## 2018-02-15 NOTE — Telephone Encounter (Signed)
Duplicate note. See refill encounter dated 02/09/18.

## 2018-02-15 NOTE — Telephone Encounter (Signed)
Copied from [place] [id number]. Topic: Inquiry >> Feb 13, 2018  2:29 PM Pricilla Handler wrote: Reason for CRM: Patient called regarding the medication refill request she placed on last Tuesday 02/07/2018. Patient still has no medication. oxyCODONE (OXYCONTIN) 20 mg 12 hr tablet. Patient contacted her pharmacy, but they had nothing. Patient needs medication ASAP. Patient last had medication on Friday 02/10/2018. Patient's preferred pharmacy is [facility], [place], [place]!!!  >> Feb 15, 2018  9:37 AM Bea Graff, NT wrote: Pt calling to check status on pain medication being called in.

## 2018-02-17 ENCOUNTER — Encounter (HOSPITAL_BASED_OUTPATIENT_CLINIC_OR_DEPARTMENT_OTHER): Payer: Medicare Other | Attending: Internal Medicine

## 2018-02-17 DIAGNOSIS — I10 Essential (primary) hypertension: Secondary | ICD-10-CM | POA: Diagnosis not present

## 2018-02-17 DIAGNOSIS — G35 Multiple sclerosis: Secondary | ICD-10-CM | POA: Diagnosis not present

## 2018-02-17 DIAGNOSIS — L89313 Pressure ulcer of right buttock, stage 3: Secondary | ICD-10-CM | POA: Diagnosis not present

## 2018-02-17 DIAGNOSIS — E1140 Type 2 diabetes mellitus with diabetic neuropathy, unspecified: Secondary | ICD-10-CM | POA: Diagnosis not present

## 2018-02-20 NOTE — Telephone Encounter (Signed)
Pt is calling again about med refill she has been waiting 2 weeks to get the medication refilled.

## 2018-02-21 NOTE — Telephone Encounter (Signed)
  Patient called to follow up with previous request request for refill for oxycodone 20 mg 12 hr tablet

## 2018-02-22 ENCOUNTER — Telehealth: Payer: Self-pay | Admitting: Family Medicine

## 2018-02-22 DIAGNOSIS — H2513 Age-related nuclear cataract, bilateral: Secondary | ICD-10-CM | POA: Diagnosis not present

## 2018-02-22 DIAGNOSIS — G35 Multiple sclerosis: Secondary | ICD-10-CM | POA: Diagnosis not present

## 2018-02-22 DIAGNOSIS — H52203 Unspecified astigmatism, bilateral: Secondary | ICD-10-CM | POA: Diagnosis not present

## 2018-02-22 DIAGNOSIS — H524 Presbyopia: Secondary | ICD-10-CM | POA: Diagnosis not present

## 2018-02-22 DIAGNOSIS — E1142 Type 2 diabetes mellitus with diabetic polyneuropathy: Secondary | ICD-10-CM | POA: Diagnosis not present

## 2018-02-22 DIAGNOSIS — H35372 Puckering of macula, left eye: Secondary | ICD-10-CM | POA: Diagnosis not present

## 2018-02-22 DIAGNOSIS — Z7984 Long term (current) use of oral hypoglycemic drugs: Secondary | ICD-10-CM | POA: Diagnosis not present

## 2018-02-22 MED ORDER — OXYCODONE HCL ER 20 MG PO T12A: 20.0000 mg | EXTENDED_RELEASE_TABLET | Freq: Three times a day (TID) | ORAL | 0 refills | Status: DC | PRN

## 2018-02-22 MED ORDER — OXYCODONE-ACETAMINOPHEN 10-325 MG PO TABS: 1.0000 | ORAL_TABLET | Freq: Three times a day (TID) | ORAL | 0 refills | Status: DC | PRN

## 2018-02-22 NOTE — Telephone Encounter (Signed)
Noted, refill request re-routed to provider.

## 2018-02-22 NOTE — Telephone Encounter (Signed)
Copied from [place] [id number]. Topic: Quick Communication - Rx Refill/Question >> Feb 22, 2018 11:13 AM Scherrie Gerlach wrote: Medication: oxyCODONE (OXYCONTIN) 20 mg 12 hr tablet [facility] pharmacy calling because the Rx states TID.  However, insurance does not want to pay because this is a 12 hour rx.  If the dr wants the pt to be able to take TID, you will need to call the pt's insurance to get prior auth.  [facility]  [phone]  option 1  If pt should be on the BID, please send a new Rx   [facility], [place], [place] [phone] (Phone) [phone] (Fax)

## 2018-02-22 NOTE — Telephone Encounter (Signed)
[name], NT    5:35 PM  Note       Patient called to follow up with previous request request for refill for oxycodone 20 mg 12 hr tablet

## 2018-02-23 NOTE — Telephone Encounter (Signed)
Please advise 

## 2018-02-23 NOTE — Telephone Encounter (Signed)
[place].  Spoke to [employer].  Pt was dispensed dose for BID. Pt was previously taking TID  Please call insurance to start a prior authorization.  Patient was using  oxycontin as TID for chronic pain management due to pain from MS.

## 2018-02-24 DIAGNOSIS — N318 Other neuromuscular dysfunction of bladder: Secondary | ICD-10-CM | POA: Diagnosis not present

## 2018-03-02 ENCOUNTER — Telehealth: Payer: Self-pay | Admitting: Family Medicine

## 2018-03-02 DIAGNOSIS — Z7409 Other reduced mobility: Secondary | ICD-10-CM

## 2018-03-02 DIAGNOSIS — G35 Multiple sclerosis: Secondary | ICD-10-CM

## 2018-03-02 DIAGNOSIS — R5381 Other malaise: Secondary | ICD-10-CM

## 2018-03-02 NOTE — Telephone Encounter (Signed)
Copied from [place] [id number]. Topic: General - Other >> Mar 02, 2018  4:34 PM Valla Leaver wrote: Reason for CRM: Patient would like Dr. Kaleen Mask to contact [facility] for medical equipment. She says this has been ongoing for a while and she hasn't gotten a response since January 2019.

## 2018-03-03 NOTE — Telephone Encounter (Signed)
Spoke with pt and [facility]. Pt is requesting rx for hoyer lift that was ordered 6 months ago. [facility] is faxing over needed paperwork to get this process started. Pt will need to have face to face visit within 6 months to establish need of hoyer lift. Fax has been placed in your box for completion. If you need more clarification you can reach [place] with [facility] at [phone]

## 2018-03-14 NOTE — Telephone Encounter (Signed)
Pt called for an update on the lift,  Has the paperwork  Been completed,  Please update the pt.  [phone]

## 2018-03-16 ENCOUNTER — Other Ambulatory Visit: Payer: Self-pay | Admitting: Family Medicine

## 2018-03-16 DIAGNOSIS — Z7409 Other reduced mobility: Secondary | ICD-10-CM

## 2018-03-16 DIAGNOSIS — G35 Multiple sclerosis: Secondary | ICD-10-CM

## 2018-03-16 DIAGNOSIS — R5381 Other malaise: Secondary | ICD-10-CM

## 2018-03-16 NOTE — Telephone Encounter (Signed)
Completed form and printed script for [name] and put in the fax bin At 5:51pm on 03/16/2018

## 2018-03-17 ENCOUNTER — Encounter (HOSPITAL_BASED_OUTPATIENT_CLINIC_OR_DEPARTMENT_OTHER): Payer: Medicare Other | Attending: Internal Medicine

## 2018-03-17 DIAGNOSIS — G35 Multiple sclerosis: Secondary | ICD-10-CM | POA: Insufficient documentation

## 2018-03-17 DIAGNOSIS — L89313 Pressure ulcer of right buttock, stage 3: Secondary | ICD-10-CM | POA: Diagnosis not present

## 2018-03-21 DIAGNOSIS — N318 Other neuromuscular dysfunction of bladder: Secondary | ICD-10-CM | POA: Diagnosis not present

## 2018-04-03 ENCOUNTER — Other Ambulatory Visit: Payer: Self-pay | Admitting: Family Medicine

## 2018-04-03 NOTE — Telephone Encounter (Signed)
Refill request for Oxycodone-acetaminophen(Percocet) 10-325mg  tab, last filled on 02/22/18 #90  LOV: 01/04/18 PCP: Dr. [facility]

## 2018-04-03 NOTE — Telephone Encounter (Signed)
Copied from [place] [id number]. Topic: Quick Communication - Rx Refill/Question >> Apr 03, 2018  1:04 PM Oliver Pila B wrote: Medication: oxyCODONE-acetaminophen (PERCOCET) 10-325 MG tablet [[id number]]  Has the patient contacted their pharmacy? Yes.   (Agent: If no, request that the patient contact the pharmacy for the refill.) Preferred Pharmacy (with phone number or street name): [facility] Agent: Please be advised that RX refills may take up to 3 business days. We ask that you follow-up with your pharmacy.

## 2018-04-03 NOTE — Telephone Encounter (Signed)
Please advise. Dgaddy, CMA 

## 2018-04-05 DIAGNOSIS — N318 Other neuromuscular dysfunction of bladder: Secondary | ICD-10-CM | POA: Diagnosis not present

## 2018-04-05 NOTE — Telephone Encounter (Signed)
Patient is requesting a refill of the following medications: Requested Prescriptions   Pending Prescriptions Disp Refills  . oxyCODONE-acetaminophen (PERCOCET) 10-325 MG tablet 90 tablet 0    Sig: Take 1 tablet by mouth every 8 (eight) hours as needed for pain.    Date of patient request: 04/03/18 Last office visit: 01/04/18 Date of last refill:02/22/18 Last refill amount: 90 Follow up time period per chart: N/A  Please advise. Dgaddy, CMA

## 2018-04-06 MED ORDER — OXYCODONE-ACETAMINOPHEN 10-325 MG PO TABS: 1.0000 | ORAL_TABLET | Freq: Three times a day (TID) | ORAL | 0 refills | Status: DC | PRN

## 2018-04-06 NOTE — Telephone Encounter (Signed)
Spoke with pt advised dr Nolon Rod is sending over medication and to check with pharmacy to make sure it is ready.  Pt agreeable. DGaddy, CMA

## 2018-04-06 NOTE — Telephone Encounter (Signed)
Pt is calling to check status of RX. She has been out since Sunday 04/02/18. She requested refill 04/03/18. Pt requesting call back at [phone].  [place], [place], [place] [phone] (Phone) [phone] (Fax)

## 2018-04-11 NOTE — Telephone Encounter (Signed)
Phone call to Cloyd Stagers, case manager with CAP program. Left detailed message fax was just sent to [place]. Please call back if any other questions.   Phone call to Gwenith Spitz at [facility]. Left message for Rollene Fare that fax has been sent to fax number listed on order form, will also send to fax number below attn [place]. Please call back if any questions.

## 2018-04-11 NOTE — Telephone Encounter (Signed)
[name] case manager with [facility] CAP program calling to check status on a rx for a hoyer lift and office notes being faxed to [place] for this pt. [facility] is stating they still have not received this fax. CB#: [phone]  Please put fax Attn: Gwenith Spitz Fax#: [phone]

## 2018-04-13 ENCOUNTER — Encounter (HOSPITAL_BASED_OUTPATIENT_CLINIC_OR_DEPARTMENT_OTHER): Payer: Medicare Other | Attending: Internal Medicine

## 2018-04-13 DIAGNOSIS — L89313 Pressure ulcer of right buttock, stage 3: Secondary | ICD-10-CM | POA: Diagnosis not present

## 2018-04-13 DIAGNOSIS — N318 Other neuromuscular dysfunction of bladder: Secondary | ICD-10-CM | POA: Diagnosis not present

## 2018-04-13 DIAGNOSIS — Z993 Dependence on wheelchair: Secondary | ICD-10-CM | POA: Diagnosis not present

## 2018-04-13 DIAGNOSIS — N3020 Other chronic cystitis without hematuria: Secondary | ICD-10-CM | POA: Diagnosis not present

## 2018-04-13 DIAGNOSIS — G35 Multiple sclerosis: Secondary | ICD-10-CM | POA: Diagnosis not present

## 2018-04-13 DIAGNOSIS — I10 Essential (primary) hypertension: Secondary | ICD-10-CM | POA: Diagnosis not present

## 2018-04-13 DIAGNOSIS — E1140 Type 2 diabetes mellitus with diabetic neuropathy, unspecified: Secondary | ICD-10-CM | POA: Insufficient documentation

## 2018-04-19 ENCOUNTER — Encounter: Payer: Self-pay | Admitting: Podiatry

## 2018-04-19 ENCOUNTER — Ambulatory Visit: Payer: Medicare Other | Admitting: Podiatry

## 2018-04-19 ENCOUNTER — Ambulatory Visit (INDEPENDENT_AMBULATORY_CARE_PROVIDER_SITE_OTHER): Payer: Medicare Other | Admitting: Podiatry

## 2018-04-19 DIAGNOSIS — B351 Tinea unguium: Secondary | ICD-10-CM | POA: Diagnosis not present

## 2018-04-19 DIAGNOSIS — L84 Corns and callosities: Secondary | ICD-10-CM | POA: Diagnosis not present

## 2018-04-19 DIAGNOSIS — E0842 Diabetes mellitus due to underlying condition with diabetic polyneuropathy: Secondary | ICD-10-CM | POA: Diagnosis not present

## 2018-04-19 DIAGNOSIS — M79676 Pain in unspecified toe(s): Secondary | ICD-10-CM

## 2018-04-24 NOTE — Progress Notes (Signed)
    Subjective: Patient is a 57 y.o. female with PMHx of DM presenting to the office today with a chief complaint of painful callus lesions to the bilateral feet that have been present for several months. Walking increases the pain. She has not done anything for treatment at home.  Patient also complains of elongated, thickened nails that cause pain while ambulating in shoes. She is unable to trim her own nails. Patient presents today for further treatment and evaluation.   Past Medical History:  Diagnosis Date  . Arthritis   . Decubitus ulcers   . Diabetes mellitus without complication (HCC)   . Hypertension   . MS (multiple sclerosis) (HCC)     Objective:  Physical Exam General: Alert and oriented x3 in no acute distress  Dermatology: Hyperkeratotic lesions present on the bilateral feet x 2. Pain on palpation with a central nucleated core noted. Skin is warm, dry and supple bilateral lower extremities. Negative for open lesions or macerations. Nails are tender, long, thickened and dystrophic with subungual debris, consistent with onychomycosis, 1-5 bilateral. No signs of infection noted.  Vascular: Palpable pedal pulses bilaterally. No edema or erythema noted. Capillary refill within normal limits.  Neurological: Epicritic and protective threshold grossly intact bilaterally.   Musculoskeletal Exam: Pain on palpation at the keratotic lesion noted. Range of motion within normal limits bilateral. Muscle strength 5/5 in all groups bilateral.  Assessment: 1. Onychodystrophic nails 1-5 bilateral with hyperkeratosis of nails.  2. Onychomycosis of nail due to dermatophyte bilateral 3. Pre-ulcerative callus lesions to the bilateral feet x 2   Plan of Care:  #1 Patient evaluated. #2 Excisional debridement of keratoic lesion using a chisel blade was performed without incident.  #3 Dressed with light dressing. #4 Mechanical debridement of nails 1-5 bilaterally performed using a nail  nipper. Filed with dremel without incident.  #5 Patient is to return to the clinic in 3 months.   Aylin Rhoads M. Kinberly Perris, DPM [facility]  Dr. Maral Lampe M. Jhamari Markowicz, DPM    [address]                                        [place], [place] [address]                Office [phone]  Fax [phone]   

## 2018-04-26 DIAGNOSIS — N3020 Other chronic cystitis without hematuria: Secondary | ICD-10-CM | POA: Diagnosis not present

## 2018-04-26 DIAGNOSIS — N318 Other neuromuscular dysfunction of bladder: Secondary | ICD-10-CM | POA: Diagnosis not present

## 2018-04-27 ENCOUNTER — Other Ambulatory Visit: Payer: Self-pay | Admitting: Family Medicine

## 2018-04-28 NOTE — Telephone Encounter (Signed)
Hydrodiuril 25 mg and K-Dur 10 mEq   LOV 01/04/18 with Dr. Nolon Rod.   These meds were not addressed.  [facility] - [place], [place] [phone] [address]

## 2018-05-01 ENCOUNTER — Encounter: Payer: Self-pay | Admitting: Family Medicine

## 2018-05-11 ENCOUNTER — Other Ambulatory Visit: Payer: Self-pay | Admitting: Family Medicine

## 2018-05-11 ENCOUNTER — Encounter (HOSPITAL_BASED_OUTPATIENT_CLINIC_OR_DEPARTMENT_OTHER): Payer: Medicare Other | Attending: Internal Medicine

## 2018-05-11 DIAGNOSIS — L89313 Pressure ulcer of right buttock, stage 3: Secondary | ICD-10-CM | POA: Insufficient documentation

## 2018-05-11 DIAGNOSIS — G35 Multiple sclerosis: Secondary | ICD-10-CM | POA: Diagnosis not present

## 2018-05-11 DIAGNOSIS — I10 Essential (primary) hypertension: Secondary | ICD-10-CM | POA: Diagnosis not present

## 2018-05-11 DIAGNOSIS — E1140 Type 2 diabetes mellitus with diabetic neuropathy, unspecified: Secondary | ICD-10-CM | POA: Insufficient documentation

## 2018-05-11 NOTE — Telephone Encounter (Signed)
LOV 01/04/18 Dr.Stallings Last refill 02/22/18  # 90 with 0 refill

## 2018-05-11 NOTE — Telephone Encounter (Signed)
Copied from [place] [id number]. Topic: Quick Communication - See Telephone Encounter >> May 11, 2018 10:09 AM Ivar Drape wrote: CRM for notification. See Telephone encounter for: 05/11/18. Patient would like her oxyCODONE (OXYCONTIN) 20 mg 12 hr tablet medication refilled and sent to her preferred pharmacy [facility].

## 2018-05-16 ENCOUNTER — Other Ambulatory Visit: Payer: Self-pay | Admitting: Family Medicine

## 2018-05-16 NOTE — Telephone Encounter (Signed)
Copied from [place] [id number]. Topic: Quick Communication - Rx Refill/Question >> May 16, 2018 11:14 AM Cleaster Corin, NT wrote: Medication: oxyCODONE-acetaminophen (PERCOCET) 10-325 MG tablet [[id number]]   Has the patient contacted their pharmacy? yes (Agent: If no, request that the patient contact the pharmacy for the refill.) (Agent: If yes, when and what did the pharmacy advise?)  Preferred Pharmacy (with phone number or street name): [facility] [place], [place], [place] [address] [place] [address] Phone: [phone] Fax: [phone]    Agent: Please be advised that RX refills may take up to 3 business days. We ask that you follow-up with your pharmacy.

## 2018-05-16 NOTE — Telephone Encounter (Signed)
Telephone request from patient          requesting refill   of Percocet 10- 325mg      LOV 12/15/17 with Delia Chimes  ///last refill 04/06/18   ///Pharmacy:  [facility].

## 2018-05-16 NOTE — Telephone Encounter (Signed)
Patient is requesting a refill of the following medications: Requested Prescriptions   Pending Prescriptions Disp Refills  . oxyCODONE-acetaminophen (PERCOCET) 10-325 MG tablet 90 tablet 0    Sig: Take 1 tablet by mouth every 8 (eight) hours as needed for pain.    Date of patient request: 05/16/18 Last office visit: 12/15/17 Date of last refill: 04/06/18 Last refill amount: 30 day supply, #90, no refills Follow up time period per chart: Return in about 6 months (around 07/04/2018

## 2018-05-16 NOTE — Addendum Note (Signed)
Addended by: Delle Reining on: 05/16/2018 04:04 PM   Modules accepted: Orders

## 2018-05-17 MED ORDER — OXYCODONE HCL ER 20 MG PO T12A: 20.0000 mg | EXTENDED_RELEASE_TABLET | Freq: Three times a day (TID) | ORAL | 0 refills | Status: DC | PRN

## 2018-05-17 MED ORDER — OXYCODONE-ACETAMINOPHEN 10-325 MG PO TABS: 1.0000 | ORAL_TABLET | Freq: Three times a day (TID) | ORAL | 0 refills | Status: DC | PRN

## 2018-05-19 DIAGNOSIS — N318 Other neuromuscular dysfunction of bladder: Secondary | ICD-10-CM | POA: Diagnosis not present

## 2018-06-06 ENCOUNTER — Other Ambulatory Visit: Payer: Self-pay | Admitting: Family Medicine

## 2018-06-07 NOTE — Telephone Encounter (Signed)
Refill request for baclofen 20 mg # 120 and potassium chloride 10 MEQ # 180 with 0 refills approved.  Pt needs ov.  Will send to schedulers to call pt to schedule f/u diabetes. Dgaddy, CMA

## 2018-06-07 NOTE — Telephone Encounter (Signed)
Baclofen 20 mg and K-Dur 10 mEq refill requests  LOV 01/04/18 with Dr. Nolon Rod        Needs appt.  [place], [place]

## 2018-06-09 ENCOUNTER — Encounter (HOSPITAL_BASED_OUTPATIENT_CLINIC_OR_DEPARTMENT_OTHER): Payer: Medicare Other | Attending: Internal Medicine

## 2018-06-09 DIAGNOSIS — N318 Other neuromuscular dysfunction of bladder: Secondary | ICD-10-CM | POA: Diagnosis not present

## 2018-06-09 DIAGNOSIS — E1140 Type 2 diabetes mellitus with diabetic neuropathy, unspecified: Secondary | ICD-10-CM | POA: Insufficient documentation

## 2018-06-09 DIAGNOSIS — L89313 Pressure ulcer of right buttock, stage 3: Secondary | ICD-10-CM | POA: Diagnosis not present

## 2018-06-09 DIAGNOSIS — G35 Multiple sclerosis: Secondary | ICD-10-CM | POA: Insufficient documentation

## 2018-06-09 DIAGNOSIS — I10 Essential (primary) hypertension: Secondary | ICD-10-CM | POA: Diagnosis not present

## 2018-06-21 DIAGNOSIS — E1142 Type 2 diabetes mellitus with diabetic polyneuropathy: Secondary | ICD-10-CM | POA: Diagnosis not present

## 2018-06-21 DIAGNOSIS — E782 Mixed hyperlipidemia: Secondary | ICD-10-CM | POA: Diagnosis not present

## 2018-06-21 DIAGNOSIS — Z7984 Long term (current) use of oral hypoglycemic drugs: Secondary | ICD-10-CM | POA: Diagnosis not present

## 2018-06-21 DIAGNOSIS — I10 Essential (primary) hypertension: Secondary | ICD-10-CM | POA: Diagnosis not present

## 2018-06-27 ENCOUNTER — Other Ambulatory Visit: Payer: Self-pay | Admitting: Family Medicine

## 2018-06-27 NOTE — Telephone Encounter (Signed)
Copied from [place] [id number]. Topic: Quick Communication - Rx Refill/Question >> Jun 27, 2018  3:06 PM Burchel, Abbi R wrote: Medication: oxyCODONE-acetaminophen (PERCOCET) 10-325 MG tablet, oxyCODONE (OXYCONTIN) 20 mg 12 hr tablet   Preferred [place], [place], [place] [address] Phone: [phone] Fax: [phone] Not a 24 hour pharmacy; exact hours not known.   Pt will be out of meds tomorrow. Pt was  advised that RX refills may take up to 3 business days.

## 2018-06-28 NOTE — Telephone Encounter (Signed)
Oxycodone and Percocet refill Last OV:01/04/18 Last refill:Both 05/17/18 90 tab/0 refill ZFP:[id number] Pharmacy: [place], [place], [place] [phone] (Phone) [phone] (Fax)

## 2018-06-29 MED ORDER — OXYCODONE HCL ER 20 MG PO T12A: 20.0000 mg | EXTENDED_RELEASE_TABLET | Freq: Three times a day (TID) | ORAL | 0 refills | Status: DC | PRN

## 2018-06-29 MED ORDER — OXYCODONE-ACETAMINOPHEN 10-325 MG PO TABS: 1.0000 | ORAL_TABLET | Freq: Three times a day (TID) | ORAL | 0 refills | Status: DC | PRN

## 2018-06-29 NOTE — Telephone Encounter (Signed)
Patient is requesting a refill of the following medications: Requested Prescriptions   Pending Prescriptions Disp Refills  . oxyCODONE-acetaminophen (PERCOCET) 10-325 MG tablet 90 tablet 0    Sig: Take 1 tablet by mouth every 8 (eight) hours as needed for pain.  Marland Kitchen oxyCODONE (OXYCONTIN) 20 mg 12 hr tablet 90 tablet 0    Sig: Take 1 tablet (20 mg total) by mouth 3 (three) times daily as needed (pain).    Date of patient request: 06/28/18 Last office visit: 01/04/18 Date of last refill: 05/17/18 Last refill amount: 90  Follow up time period per chart: n/a  Please advise Dgaddy, CMA

## 2018-06-30 DIAGNOSIS — N318 Other neuromuscular dysfunction of bladder: Secondary | ICD-10-CM | POA: Diagnosis not present

## 2018-07-04 ENCOUNTER — Telehealth: Payer: Self-pay | Admitting: Family Medicine

## 2018-07-04 NOTE — Telephone Encounter (Signed)
Attempted to call pt multiple times and line was continuously busy.  Unable to leave a voice message to advise her to reschedule due to provider being sick.  I will try again later this afternoon.

## 2018-07-05 ENCOUNTER — Ambulatory Visit: Payer: Medicare Other | Admitting: Family Medicine

## 2018-07-06 ENCOUNTER — Encounter (HOSPITAL_BASED_OUTPATIENT_CLINIC_OR_DEPARTMENT_OTHER): Payer: Medicare Other | Attending: Internal Medicine

## 2018-07-06 ENCOUNTER — Other Ambulatory Visit: Payer: Self-pay | Admitting: Family Medicine

## 2018-07-06 DIAGNOSIS — L89313 Pressure ulcer of right buttock, stage 3: Secondary | ICD-10-CM | POA: Diagnosis not present

## 2018-07-06 DIAGNOSIS — I10 Essential (primary) hypertension: Secondary | ICD-10-CM | POA: Diagnosis not present

## 2018-07-06 DIAGNOSIS — G35 Multiple sclerosis: Secondary | ICD-10-CM | POA: Diagnosis not present

## 2018-07-06 DIAGNOSIS — L89319 Pressure ulcer of right buttock, unspecified stage: Secondary | ICD-10-CM | POA: Diagnosis not present

## 2018-07-06 DIAGNOSIS — Z993 Dependence on wheelchair: Secondary | ICD-10-CM | POA: Diagnosis not present

## 2018-07-06 DIAGNOSIS — E1140 Type 2 diabetes mellitus with diabetic neuropathy, unspecified: Secondary | ICD-10-CM | POA: Diagnosis not present

## 2018-07-18 NOTE — Progress Notes (Signed)
Chief Complaint  Patient presents with  . 6 month f/u    HPI  Diabetes Mellitus: Patient presents for follow up of diabetes.  She denies hypoglycemia. She is compliant with her metformin and glipizide. She sees Dr. Posey Pronto for Endocrinology and she still sees Podiatry for diabetic foot care   Chronic Pain Mgmt She is stable on her current dose  She reports that she has manageable pain with the  oxycontin and percocet  Wheelchair bound/decubitus ulcers She reports that she is having some improvement in her decubitus ulcers The dressing changes are helping but because she spends so much time sitting in her chair or lying in bed she does not every get it to completely heel. She denies fevers or chills  Hypertension: Patient here for follow-up of elevated blood pressure.  BP Readings from Last 3 Encounters:  07/19/18 (!) 150/73  01/04/18 130/77  12/02/17 (!) 114/59   She is compliant with her medications but did not take her medication today She denies lightheadedness or chest pains   Past Medical History:  Diagnosis Date  . Arthritis   . Decubitus ulcers   . Diabetes mellitus without complication ([place])   . Hypertension   . MS (multiple sclerosis) ([place])     Current Outpatient Medications  Medication Sig Dispense Refill  . Adhesive Tape (PAPER TAPE 1"X10YD) TAPE Apply to dressings as needed 4 each 6  . atorvastatin (LIPITOR) 10 MG tablet Take 10 mg by mouth daily.     . baclofen (LIORESAL) 20 MG tablet TAKE 1 TABLET BY MOUTH FOUR TIMES A DAY**OFFICE VISIT NEEDED** 120 tablet 0  . Catheters (DOVER UNIVERSAL FOLEY TRAY) KIT Use foley tray kit to change catheter every 3 weeks or as needed 15 each 1  . Catheters (FOLEY CATHETER 2-WAY) MISC Use to aspirate urine suprapubic 15 each 1  . cyanocobalamin 500 MCG tablet Take 500 mcg by mouth daily.    . Disposable Gloves MISC Use disposable gloves to change catheter as needed 200 each 11  . glipiZIDE (GLUCOTROL) 5 MG tablet Take 1  tablet (5 mg total) by mouth daily before lunch. 90 tablet 3  . hydrochlorothiazide (HYDRODIURIL) 25 MG tablet TAKE 1 TABLET BY MOUTH EVERY MORNING  OFFICE VISIT NEEDED 90 tablet 0  . Incontinence Supply Disposable (TENA FLEX 16 PLUS) MISC Use daily as needed 30 each 11  . Incontinence Supply Disposable (TENA PROTECT UNDERWEAR PLS/XLG) MISC Use for incontinence. 30 each 11  . Interferon Beta-1b (BETASERON) 0.3 MG KIT injection Inject 0.3 mg into the skin every other day. 36 each 3  . lidocaine (XYLOCAINE) 2 % jelly USE when need with dressings  1  . metFORMIN (GLUCOPHAGE) 1000 MG tablet Take 1,000 mg by mouth 2 (two) times daily with a meal.    . Methenamine-Sodium Salicylate (AZO URINARY TRACT DEFENSE) 162-162.5 MG TABS Take as instructed    . MOVANTIK 25 MG TABS tablet Take 25 mg by mouth every morning.  4  . NON FORMULARY Power wheelchair repairs  Diagnosis code z99.3 1 each 0  . nystatin ointment (MYCOSTATIN) Apply to affected area twice daily as needed for irritation 30 g 6  . Ostomy Supplies (NEW IMAGE SKIN/FLANGE/TAPE) MISC 1 application by Does not apply route daily as needed. 30 each 11  . oxyCODONE-acetaminophen (PERCOCET) 10-325 MG tablet Take 1 tablet by mouth every 8 (eight) hours as needed for pain. Needs office visit. 90 tablet 0  . pantoprazole (PROTONIX) 40 MG tablet Take 1 tablet (40  mg total) by mouth daily. 30 tablet 1  . potassium chloride (K-DUR,KLOR-CON) 10 MEQ tablet TAKE 3 TABLETS BY MOUTH TWICE DAILY 180 tablet 0  . Syringe, Disposable, (B-D SYRINGE LUER-LOK 30CC) 30 ML MISC Use to change catheter 15 each 0  . Wound Dressings (GRX HYDROGEL GAUZE 4X4) PADS Use gauze to clean wound daily as needed 200 each 11  . Cholecalciferol (D3-1000) 1000 units capsule Take 2,000 Units by mouth daily.     . Control Gel Formula Dressing (DUODERM CGF DRESSING) MISC Apply 1 each topically daily as needed. 5 each 11  . furosemide (LASIX) 20 MG tablet Take 1 tablet (20 mg total) by mouth  daily. 90 tablet 0  . NON FORMULARY Dispense catheter urine drainage bags 90 Bag 3  . oxyCODONE (OXYCONTIN) 20 mg 12 hr tablet Take 1 tablet (20 mg total) by mouth 3 (three) times daily as needed (pain). 90 tablet 0   No current facility-administered medications for this visit.      Allergies: No Known Allergies  Past Surgical History:  Procedure Laterality Date  . ESOPHAGOGASTRODUODENOSCOPY N/A 12/02/2017   Procedure: ESOPHAGOGASTRODUODENOSCOPY (EGD);  Surgeon: Ronnette Juniper, MD;  Location: [facility];  Service: Gastroenterology;  Laterality: N/A;  . SUPRAPUBIC CATHETER INSERTION  2009  . TONSILLECTOMY    . TUBAL LIGATION      Social History   Socioeconomic History  . Marital status: Single    Spouse name: Not on file  . Number of children: Not on file  . Years of education: Not on file  . Highest education level: Not on file  Occupational History  . Not on file  Social Needs  . Financial resource strain: Not on file  . Food insecurity:    Worry: Not on file    Inability: Not on file  . Transportation needs:    Medical: Not on file    Non-medical: Not on file  Tobacco Use  . Smoking status: Never Smoker  . Smokeless tobacco: Never Used  Substance and Sexual Activity  . Alcohol use: No  . Drug use: No  . Sexual activity: Not on file  Lifestyle  . Physical activity:    Days per week: Not on file    Minutes per session: Not on file  . Stress: Not on file  Relationships  . Social connections:    Talks on phone: Not on file    Gets together: Not on file    Attends religious service: Not on file    Active member of club or organization: Not on file    Attends meetings of clubs or organizations: Not on file    Relationship status: Not on file  Other Topics Concern  . Not on file  Social History Narrative  . Not on file    Family History  Problem Relation Age of Onset  . Cancer Mother   . Hypertension Mother   . Heart disease Father   . Diabetes Father     . Hypertension Sister   . Hypertension Brother      ROS Review of Systems See HPI Constitution: No fevers or chills No malaise No diaphoresis Skin: No rash or itching Eyes: no blurry vision, no double vision GU: no dysuria or hematuria Neuro: no dizziness or headaches  all others reviewed and negative   Objective: Vitals:   07/19/18 1138  BP: (!) 150/73  Pulse: 99  Resp: 17  Temp: 98.3 F (36.8 C)  TempSrc: Oral  SpO2: 97%  Weight: 220 lb (99.8 kg)  Height: '5\' 7"'$  (1.702 m)    Physical Exam  Physical Exam  Constitutional: She is oriented to person, place, and time. She appears well-developed and well-nourished.  HENT:  Head: Normocephalic and atraumatic.  Eyes: Conjunctivae and EOM are normal.  Cardiovascular: Normal rate, regular rhythm and normal heart sounds.   Pulmonary/Chest: Effort normal and breath sounds normal. No respiratory distress. She has no wheezes.  Abdominal: Normal appearance and bowel sounds are normal. There is no tenderness. There is no CVA tenderness.  Neurological: She is alert and oriented to person, place, and time.     POCT Hemoglobin A1C  POCT Glucose     POCT Hemoglobin A1C POCT Hemoglobin A1C  Component Value Ref Range Performed At  HEMOGLOBIN A1C, POC 6.5 (A) 4.1 - 6.4 %   LOT NUMBER [id number]    Exp. Date 3202021     POCT Hemoglobin A1C  Specimen  Blood  Back to top of Lab Results    POCT Glucose POCT Glucose  Component Value Ref Range Performed At  POC GLUCOSE COMMENT Post-Meal  [facility]  GLUCOSE, POC, [facility] 199 (H) 70 - 99 mg/dL [facility]   POCT Glucose  Performing Organization Address City/State/Zipcode Phone Number  [facility]          Assessment and Plan Javana was seen today for 6 month f/u.  Diagnoses and all orders for this visit:  Chronically on opiate therapy -     oxyCODONE-acetaminophen (PERCOCET) 10-325 MG tablet; Take 1 tablet by mouth every 8 (eight) hours as needed for pain. Needs office visit. -      Discontinue: oxyCODONE (OXYCONTIN) 20 mg 12 hr tablet; Take 1 tablet (20 mg total) by mouth 3 (three) times daily as needed (pain).  Multiple sclerosis ([place])- stable, continue pain mgmt -     oxyCODONE-acetaminophen (PERCOCET) 10-325 MG tablet; Take 1 tablet by mouth every 8 (eight) hours as needed for pain. Needs office visit. -     Discontinue: oxyCODONE (OXYCONTIN) 20 mg 12 hr tablet; Take 1 tablet (20 mg total) by mouth 3 (three) times daily as needed (pain). -     CBC  Essential hypertension- bp normally well controlled, cpm -     Comprehensive metabolic panel -     Lipid panel -     TSH  Type 2 diabetes mellitus with diabetic polyneuropathy, without long-term current use of insulin ([place])- stable diabetes Continue Endocrinology recs -     Comprehensive metabolic panel -     Lipid panel -     TSH  Screening for cervical cancer Morbid obesity ([place]) Wheelchair bound -     Ambulatory referral to Gynecologic Oncology  Pt has limitations of getting onto the exam table, she needs a hoyer lift and a bariatric exam table as well as metal speculums.  Due to her morbid obesity I would prefer she see Gynecology.  She has attempted to go to routine Gyne but they would not see her  Will refer to [place]   Other orders -     Adhesive Tape (PAPER TAPE 1"X10YD) TAPE; Apply to dressings as needed -     Ostomy Supplies (NEW IMAGE SKIN/FLANGE/TAPE) MISC; 1 application by Does not apply route daily as needed. -     Incontinence Supply Disposable (TENA PROTECT UNDERWEAR PLS/XLG) MISC; Use for incontinence. -     Incontinence Supply Disposable (TENA FLEX 16 PLUS) MISC; Use daily as needed -     NON FORMULARY;  Dispense catheter urine drainage bags     Coral Timme A Nolon Rod

## 2018-07-19 ENCOUNTER — Encounter: Payer: Self-pay | Admitting: Family Medicine

## 2018-07-19 ENCOUNTER — Other Ambulatory Visit: Payer: Self-pay

## 2018-07-19 ENCOUNTER — Ambulatory Visit (INDEPENDENT_AMBULATORY_CARE_PROVIDER_SITE_OTHER): Payer: Medicare Other | Admitting: Family Medicine

## 2018-07-19 VITALS — BP 150/73 | HR 99 | Temp 98.3°F | Resp 17 | Ht 67.0 in | Wt 220.0 lb

## 2018-07-19 DIAGNOSIS — Z993 Dependence on wheelchair: Secondary | ICD-10-CM

## 2018-07-19 DIAGNOSIS — E1142 Type 2 diabetes mellitus with diabetic polyneuropathy: Secondary | ICD-10-CM | POA: Diagnosis not present

## 2018-07-19 DIAGNOSIS — I10 Essential (primary) hypertension: Secondary | ICD-10-CM

## 2018-07-19 DIAGNOSIS — G35 Multiple sclerosis: Secondary | ICD-10-CM

## 2018-07-19 DIAGNOSIS — Z124 Encounter for screening for malignant neoplasm of cervix: Secondary | ICD-10-CM

## 2018-07-19 DIAGNOSIS — Z79891 Long term (current) use of opiate analgesic: Secondary | ICD-10-CM

## 2018-07-19 MED ORDER — TENA PROTECT UNDERWEAR PLS/XL MISC: 11 refills | Status: DC

## 2018-07-19 MED ORDER — OXYCODONE HCL ER 20 MG PO T12A: 20.0000 mg | EXTENDED_RELEASE_TABLET | Freq: Three times a day (TID) | ORAL | 0 refills | Status: DC | PRN

## 2018-07-19 MED ORDER — "PAPER TAPE 1""X10YD TAPE": MEDICATED_TAPE | 6 refills | Status: DC

## 2018-07-19 MED ORDER — NEW IMAGE SKIN/FLANGE/TAPE MISC: 1.0000 "application " | Freq: Every day | 11 refills | Status: DC | PRN

## 2018-07-19 MED ORDER — NON FORMULARY: 3 refills | Status: DC

## 2018-07-19 MED ORDER — OXYCODONE-ACETAMINOPHEN 10-325 MG PO TABS: 1.0000 | ORAL_TABLET | Freq: Three times a day (TID) | ORAL | 0 refills | Status: DC | PRN

## 2018-07-19 MED ORDER — TENA FLEX 16 PLUS MISC: 11 refills | Status: DC

## 2018-07-19 NOTE — Patient Instructions (Signed)
     I will contact you with your lab results within the next 2 weeks.  If you have not heard from us then please contact us. The fastest way to get your results is to register for My Chart.   IF you received an x-ray today, you will receive an invoice from [facility]. Please contact [facility] at [phone] with questions or concerns regarding your invoice.   IF you received labwork today, you will receive an invoice from [facility]. Please contact [facility] at [phone] with questions or concerns regarding your invoice.   Our billing staff will not be able to assist you with questions regarding bills from these companies.  You will be contacted with the lab results as soon as they are available. The fastest way to get your results is to activate your My Chart account. Instructions are located on the last page of this paperwork. If you have not heard from us regarding the results in 2 weeks, please contact this office.     

## 2018-07-20 LAB — COMPREHENSIVE METABOLIC PANEL
ALBUMIN: 4.3 g/dL (ref 3.5–5.5)
ALK PHOS: 67 IU/L (ref 39–117)
ALT: 12 IU/L (ref 0–32)
AST: 13 IU/L (ref 0–40)
Albumin/Globulin Ratio: 1.5 (ref 1.2–2.2)
BUN / CREAT RATIO: 15 (ref 9–23)
BUN: 12 mg/dL (ref 6–24)
Bilirubin Total: <0.2 mg/dL (ref 0.0–1.2)
CALCIUM: 10.4 mg/dL — AB (ref 8.7–10.2)
CO2: 25 mmol/L (ref 20–29)
CREATININE: 0.78 mg/dL (ref 0.57–1.00)
Chloride: 98 mmol/L (ref 96–106)
GFR calc Af Amer: 97 mL/min/{1.73_m2} (ref >59)
GFR calc non Af Amer: 84 mL/min/{1.73_m2} (ref >59)
GLUCOSE: 153 mg/dL — AB (ref 65–99)
Globulin, Total: 2.9 g/dL (ref 1.5–4.5)
Potassium: 4 mmol/L (ref 3.5–5.2)
Sodium: 141 mmol/L (ref 134–144)
TOTAL PROTEIN: 7.2 g/dL (ref 6.0–8.5)

## 2018-07-20 LAB — CBC
Hematocrit: 35.1 % (ref 34.0–46.6)
Hemoglobin: 10.8 g/dL — ABNORMAL LOW (ref 11.1–15.9)
MCH: 26 pg — AB (ref 26.6–33.0)
MCHC: 30.8 g/dL — AB (ref 31.5–35.7)
MCV: 85 fL (ref 79–97)
Platelets: 426 10*3/uL (ref 150–450)
RBC: 4.15 x10E6/uL (ref 3.77–5.28)
RDW: 14.6 % (ref 12.3–15.4)
WBC: 7.7 10*3/uL (ref 3.4–10.8)

## 2018-07-20 LAB — LIPID PANEL
CHOLESTEROL TOTAL: 130 mg/dL (ref 100–199)
Chol/HDL Ratio: 2 ratio (ref 0.0–4.4)
HDL: 64 mg/dL (ref >39)
LDL CALC: 52 mg/dL (ref 0–99)
Triglycerides: 69 mg/dL (ref 0–149)
VLDL Cholesterol Cal: 14 mg/dL (ref 5–40)

## 2018-07-20 LAB — TSH: TSH: 0.134 u[IU]/mL — AB (ref 0.450–4.500)

## 2018-07-21 ENCOUNTER — Telehealth: Payer: Self-pay | Admitting: Family Medicine

## 2018-07-21 NOTE — Telephone Encounter (Signed)
Called pt to let her know that the [facility] at [facility] hospital can see her and that they will be giving her call.

## 2018-07-21 NOTE — Telephone Encounter (Signed)
Dr. Nolon Rod wanted to see if we could get pt in to gynecology-oncology because they should have the hoy lift for pt because pt would require a lift because pt is in a wheel chair. I contacted [facility] the gyn oncology department and they stated they do not have the equipment for the pt either and that they are more set up as a doctors office setting, they told me to contact [facility] at [phone] that they are more like a hospital setting and stated that they would have the proper equipment for pt. Tried to call but they are on lunch right now left a message with a rep and will also call back after lunch.

## 2018-07-26 ENCOUNTER — Ambulatory Visit (INDEPENDENT_AMBULATORY_CARE_PROVIDER_SITE_OTHER): Payer: Medicare Other | Admitting: Podiatry

## 2018-07-26 ENCOUNTER — Encounter: Payer: Self-pay | Admitting: Podiatry

## 2018-07-26 DIAGNOSIS — B351 Tinea unguium: Secondary | ICD-10-CM

## 2018-07-26 DIAGNOSIS — E0842 Diabetes mellitus due to underlying condition with diabetic polyneuropathy: Secondary | ICD-10-CM

## 2018-07-26 DIAGNOSIS — L989 Disorder of the skin and subcutaneous tissue, unspecified: Secondary | ICD-10-CM | POA: Diagnosis not present

## 2018-07-26 DIAGNOSIS — M79676 Pain in unspecified toe(s): Secondary | ICD-10-CM

## 2018-07-27 DIAGNOSIS — N318 Other neuromuscular dysfunction of bladder: Secondary | ICD-10-CM | POA: Diagnosis not present

## 2018-07-30 NOTE — Progress Notes (Signed)
    Subjective: Patient is a 57 y.o. female with PMHx of DM presenting to the office today with a chief complaint of painful callus lesions to the bilateral feet that have been present for several months. Walking increases the pain. She has not done anything for treatment at home.  Patient also complains of elongated, thickened nails that cause pain while ambulating in shoes. She is unable to trim her own nails. Patient presents today for further treatment and evaluation.   Past Medical History:  Diagnosis Date  . Arthritis   . Decubitus ulcers   . Diabetes mellitus without complication ([place])   . Hypertension   . MS (multiple sclerosis) ([place])     Objective:  Physical Exam General: Alert and oriented x3 in no acute distress  Dermatology: Hyperkeratotic lesions present on the bilateral feet x 2. Pain on palpation with a central nucleated core noted. Skin is warm, dry and supple bilateral lower extremities. Negative for open lesions or macerations. Nails are tender, long, thickened and dystrophic with subungual debris, consistent with onychomycosis, 1-5 bilateral. No signs of infection noted.  Vascular: Palpable pedal pulses bilaterally. No edema or erythema noted. Capillary refill within normal limits.  Neurological: Epicritic and protective threshold grossly intact bilaterally.   Musculoskeletal Exam: Pain on palpation at the keratotic lesion noted. Range of motion within normal limits bilateral. Muscle strength 5/5 in all groups bilateral.  Assessment: 1. Onychodystrophic nails 1-5 bilateral with hyperkeratosis of nails.  2. Onychomycosis of nail due to dermatophyte bilateral 3. Pre-ulcerative callus lesions to the bilateral feet x 2   Plan of Care:  #1 Patient evaluated. #2 Excisional debridement of keratoic lesion using a chisel blade was performed without incident.  #3 Dressed with light dressing. #4 Mechanical debridement of nails 1-5 bilaterally performed using a nail  nipper. Filed with dremel without incident.  #5 Patient is to return to the clinic in 3 months.   Edrick Kins, DPM [facility]  Dr. Edrick Kins, [place]                                        [place], [place] [address]                Office [phone]  Fax [phone]

## 2018-07-31 ENCOUNTER — Encounter: Payer: Self-pay | Admitting: *Deleted

## 2018-08-02 DIAGNOSIS — N318 Other neuromuscular dysfunction of bladder: Secondary | ICD-10-CM | POA: Diagnosis not present

## 2018-08-03 DIAGNOSIS — G35 Multiple sclerosis: Secondary | ICD-10-CM | POA: Diagnosis not present

## 2018-08-03 DIAGNOSIS — L89312 Pressure ulcer of right buttock, stage 2: Secondary | ICD-10-CM | POA: Diagnosis not present

## 2018-08-03 DIAGNOSIS — I10 Essential (primary) hypertension: Secondary | ICD-10-CM | POA: Diagnosis not present

## 2018-08-03 DIAGNOSIS — L89313 Pressure ulcer of right buttock, stage 3: Secondary | ICD-10-CM | POA: Diagnosis not present

## 2018-08-03 DIAGNOSIS — Z993 Dependence on wheelchair: Secondary | ICD-10-CM | POA: Diagnosis not present

## 2018-08-03 DIAGNOSIS — E1140 Type 2 diabetes mellitus with diabetic neuropathy, unspecified: Secondary | ICD-10-CM | POA: Diagnosis not present

## 2018-08-10 ENCOUNTER — Other Ambulatory Visit: Payer: Self-pay | Admitting: Family Medicine

## 2018-08-11 NOTE — Telephone Encounter (Signed)
Lasix 20 mg refill Last Refill:07/07/18 # 30 Last OV: 07/19/18 PCP: [place], [place]  Returned because there is a note saying an OV is needed.   She was seen for pain medication management on 07/19/18 but the Lasix was not addressed by Dr. Nolon Rod.

## 2018-08-22 ENCOUNTER — Other Ambulatory Visit: Payer: Self-pay | Admitting: Family Medicine

## 2018-08-22 DIAGNOSIS — Z79891 Long term (current) use of opiate analgesic: Secondary | ICD-10-CM

## 2018-08-22 DIAGNOSIS — G35 Multiple sclerosis: Secondary | ICD-10-CM

## 2018-08-22 NOTE — Telephone Encounter (Signed)
Copied from [place] [id number]. Topic: Quick Communication - Rx Refill/Question >> Aug 22, 2018  1:42 PM Reyne Dumas L wrote: Medication: oxyCODONE (OXYCONTIN) 20 mg 12 hr tablet  Has the patient contacted their pharmacy? No - controlled (Agent: If no, request that the patient contact the pharmacy for the refill.) (Agent: If yes, when and what did the pharmacy advise?)  Preferred Pharmacy (with phone number or street name):   [facility], [place], [place] [phone] (Phone) [phone] (Fax)  Agent: Please be advised that RX refills may take up to 3 business days. We ask that you follow-up with your pharmacy.

## 2018-08-22 NOTE — Telephone Encounter (Signed)
Oxycodone 20 mg - 12 hour tab refill Last Refill:07/28/18 # 90 Last OV: 07/19/18 PCP: Dr Nolon Rod Pharmacy:[facility] [phone]

## 2018-08-23 DIAGNOSIS — N318 Other neuromuscular dysfunction of bladder: Secondary | ICD-10-CM | POA: Diagnosis not present

## 2018-08-24 MED ORDER — OXYCODONE HCL ER 20 MG PO T12A: 20.0000 mg | EXTENDED_RELEASE_TABLET | Freq: Three times a day (TID) | ORAL | 0 refills | Status: DC | PRN

## 2018-08-28 ENCOUNTER — Telehealth: Payer: Self-pay | Admitting: Family Medicine

## 2018-08-28 DIAGNOSIS — Z79891 Long term (current) use of opiate analgesic: Secondary | ICD-10-CM

## 2018-08-28 DIAGNOSIS — G35 Multiple sclerosis: Secondary | ICD-10-CM

## 2018-08-28 NOTE — Telephone Encounter (Signed)
Percocet refill Last Refill:07/28/18 # 90 with 0 refill Last OV: 07/19/18 PCP: Dr. Nolon Rod Pharmacy:[facility]

## 2018-08-28 NOTE — Telephone Encounter (Signed)
Copied from [place] [phone]. Topic: Quick Communication - Rx Refill/Question >> Aug 28, 2018  1:49 PM Selinda Flavin B, NT wrote: Medication: oxyCODONE-acetaminophen (PERCOCET) 10-325 MG tablet  Has the patient contacted their pharmacy? Yes.   (Agent: If no, request that the patient contact the pharmacy for the refill.) (Agent: If yes, when and what did the pharmacy advise?)  Preferred Pharmacy (with phone number or street name): [facility], [place], [place]: Please be advised that RX refills may take up to 3 business days. We ask that you follow-up with your pharmacy.

## 2018-09-01 ENCOUNTER — Encounter (HOSPITAL_BASED_OUTPATIENT_CLINIC_OR_DEPARTMENT_OTHER): Payer: Medicare Other | Attending: Internal Medicine

## 2018-09-01 DIAGNOSIS — Z993 Dependence on wheelchair: Secondary | ICD-10-CM | POA: Diagnosis not present

## 2018-09-01 DIAGNOSIS — G35 Multiple sclerosis: Secondary | ICD-10-CM | POA: Diagnosis not present

## 2018-09-01 DIAGNOSIS — E1140 Type 2 diabetes mellitus with diabetic neuropathy, unspecified: Secondary | ICD-10-CM | POA: Diagnosis not present

## 2018-09-01 DIAGNOSIS — I10 Essential (primary) hypertension: Secondary | ICD-10-CM | POA: Insufficient documentation

## 2018-09-01 DIAGNOSIS — L89313 Pressure ulcer of right buttock, stage 3: Secondary | ICD-10-CM | POA: Diagnosis not present

## 2018-09-01 MED ORDER — OXYCODONE-ACETAMINOPHEN 10-325 MG PO TABS: 1.0000 | ORAL_TABLET | Freq: Three times a day (TID) | ORAL | 0 refills | Status: DC | PRN

## 2018-09-01 NOTE — Telephone Encounter (Signed)
Pt was called and notified of prescription (Percocet) sent to her pharmacy.  Pt voiced understanding.

## 2018-09-01 NOTE — Telephone Encounter (Signed)
Patient calling to check on the status of her medication refill Please advise.

## 2018-09-01 NOTE — Telephone Encounter (Signed)
Please let the patient know that the medication was sent in.

## 2018-09-01 NOTE — Addendum Note (Signed)
Addended by: Delia Chimes A on: 09/01/2018 04:49 PM   Modules accepted: Orders

## 2018-09-07 DIAGNOSIS — N390 Urinary tract infection, site not specified: Secondary | ICD-10-CM | POA: Diagnosis not present

## 2018-09-18 ENCOUNTER — Ambulatory Visit: Payer: Medicare Other | Admitting: Obstetrics & Gynecology

## 2018-09-22 DIAGNOSIS — N318 Other neuromuscular dysfunction of bladder: Secondary | ICD-10-CM | POA: Diagnosis not present

## 2018-09-29 ENCOUNTER — Encounter (HOSPITAL_BASED_OUTPATIENT_CLINIC_OR_DEPARTMENT_OTHER): Payer: Medicare Other | Attending: Internal Medicine

## 2018-09-29 DIAGNOSIS — Z993 Dependence on wheelchair: Secondary | ICD-10-CM | POA: Insufficient documentation

## 2018-09-29 DIAGNOSIS — L89319 Pressure ulcer of right buttock, unspecified stage: Secondary | ICD-10-CM | POA: Diagnosis not present

## 2018-09-29 DIAGNOSIS — G35 Multiple sclerosis: Secondary | ICD-10-CM | POA: Diagnosis not present

## 2018-09-29 DIAGNOSIS — L89313 Pressure ulcer of right buttock, stage 3: Secondary | ICD-10-CM | POA: Diagnosis not present

## 2018-09-29 DIAGNOSIS — E1140 Type 2 diabetes mellitus with diabetic neuropathy, unspecified: Secondary | ICD-10-CM | POA: Diagnosis not present

## 2018-10-04 ENCOUNTER — Encounter: Payer: Self-pay | Admitting: Family Medicine

## 2018-10-10 ENCOUNTER — Telehealth: Payer: Self-pay | Admitting: Family Medicine

## 2018-10-10 DIAGNOSIS — G35 Multiple sclerosis: Secondary | ICD-10-CM

## 2018-10-10 DIAGNOSIS — Z79891 Long term (current) use of opiate analgesic: Secondary | ICD-10-CM

## 2018-10-10 NOTE — Telephone Encounter (Signed)
Copied from [place] [id number]. Topic: Quick Communication - Rx Refill/Question >> Oct 10, 2018  5:58 PM Waylan Rocher, Lumin L wrote: Medication: oxyCODONE-acetaminophen (PERCOCET) 10-325 MG tablet, oxyCODONE (OXYCONTIN) 20 mg 12 hr tablet   Has the patient contacted their pharmacy? Yes.   (Agent: If no, request that the patient contact the pharmacy for the refill.) (Agent: If yes, when and what did the pharmacy advise?)  Preferred Pharmacy (with phone number or street name): [place], [place], [place] [address] [place] [address] Phone: [phone] Fax: [phone]  Agent: Please be advised that RX refills may take up to 3 business days. We ask that you follow-up with your pharmacy.

## 2018-10-11 MED ORDER — OXYCODONE HCL ER 20 MG PO T12A: 20.0000 mg | EXTENDED_RELEASE_TABLET | Freq: Three times a day (TID) | ORAL | 0 refills | Status: DC | PRN

## 2018-10-11 NOTE — Addendum Note (Signed)
Addended by: Delia Chimes A on: 10/11/2018 03:53 PM   Modules accepted: Orders

## 2018-10-12 ENCOUNTER — Other Ambulatory Visit: Payer: Self-pay | Admitting: Family Medicine

## 2018-10-13 NOTE — Telephone Encounter (Signed)
Chart shows pt was called in Oxycontin 11/6 LMOVM for [place] to verify if pt has picked up.  Asked pharmacy to return call with answer if pt has picked up Oxycontin or not.   Attempted to call pt.  No answer.  Spoke with dtr who gave me cell for pt [phone] - no answer Did not discuss with dtr why I was calling.   If pt returns call, she needs appt with Nolon Rod to get any further refills on Percocet.

## 2018-10-13 NOTE — Telephone Encounter (Signed)
Pt calling back about RX percocet and oxycontin it wasn't at the pharmacy please give her a call back at [phone]

## 2018-10-13 NOTE — Telephone Encounter (Signed)
Pt. Has an appointment scheduled.

## 2018-10-18 ENCOUNTER — Other Ambulatory Visit: Payer: Self-pay | Admitting: Family Medicine

## 2018-10-18 DIAGNOSIS — G35 Multiple sclerosis: Secondary | ICD-10-CM

## 2018-10-18 DIAGNOSIS — Z79891 Long term (current) use of opiate analgesic: Secondary | ICD-10-CM

## 2018-10-18 NOTE — Telephone Encounter (Signed)
Left message to call back regarding appointment- last request states patient has to be sen before refill and although she has appointment in February- she needs appointment now.

## 2018-10-18 NOTE — Telephone Encounter (Signed)
Copied from [place] [id number]. Topic: Quick Communication - Rx Refill/Question >> Oct 18, 2018  4:07 PM Virl Axe D wrote: Medication: oxyCODONE-acetaminophen (PERCOCET) 10-325 MG tablet   Has the patient contacted their pharmacy? No. (Agent: If no, request that the patient contact the pharmacy for the refill.) (Agent: If yes, when and what did the pharmacy advise?)  Preferred Pharmacy (with phone number or street name): [place], [place], [place] [phone] (Phone) [phone] (Fax)    Agent: Please be advised that RX refills may take up to 3 business days. We ask that you follow-up with your pharmacy.

## 2018-10-18 NOTE — Telephone Encounter (Signed)
Requested medication (s) are due for refill today -yes  Requested medication (s) are on the active medication list -yes  Future visit scheduled -yes  Last refill: 09/01/18  Notes to clinic: patient is requesting refill of non delegated Rx- sent for provider review. Attempted to call patient to scheduled earlier appointment- left message on voice mail. Next visit is in February.  Requested Prescriptions  Pending Prescriptions Disp Refills   oxyCODONE-acetaminophen (PERCOCET) 10-325 MG tablet 90 tablet 0    Sig: Take 1 tablet by mouth every 8 (eight) hours as needed for pain. Needs office visit.     Not Delegated - Analgesics:  Opioid Agonist Combinations Failed - 10/18/2018  4:20 PM      Failed - This refill cannot be delegated      Failed - Urine Drug Screen completed in last 360 days.      Passed - Valid encounter within last 6 months    Recent Outpatient Visits          3 months ago Chronically on opiate therapy   Primary Care at [facility], [place] A, MD   9 months ago Type 2 diabetes mellitus with complication, without long-term current use of insulin ([place])   Primary Care at [facility], [name], MD   1 year ago Screening for cervical cancer   Primary Care at [facility], [name], MD   1 year ago Essential hypertension   Primary Care at [facility], [name], MD   1 year ago Indwelling Foley catheter present   Primary Care at [place], [place]      Future Appointments            In 3 months Forrest Moron, MD Primary Care at [place], [facility]            Requested Prescriptions  Pending Prescriptions Disp Refills   oxyCODONE-acetaminophen (PERCOCET) 10-325 MG tablet 90 tablet 0    Sig: Take 1 tablet by mouth every 8 (eight) hours as needed for pain. Needs office visit.     Not Delegated - Analgesics:  Opioid Agonist Combinations Failed - 10/18/2018  4:20 PM      Failed - This refill cannot be delegated      Failed - Urine Drug Screen completed  in last 360 days.      Passed - Valid encounter within last 6 months    Recent Outpatient Visits          3 months ago Chronically on opiate therapy   Primary Care at [facility], [place] A, MD   9 months ago Type 2 diabetes mellitus with complication, without long-term current use of insulin [facility])   Primary Care at [facility], [name], MD   1 year ago Screening for cervical cancer   Primary Care at [facility], [name], MD   1 year ago Essential hypertension   Primary Care at [facility], [name], MD   1 year ago Indwelling Foley catheter present   Primary Care at [place], MD      Future Appointments            In 3 months Forrest Moron, MD Primary Care at [place], [facility]

## 2018-10-19 MED ORDER — OXYCODONE-ACETAMINOPHEN 10-325 MG PO TABS: 1.0000 | ORAL_TABLET | Freq: Three times a day (TID) | ORAL | 0 refills | Status: DC | PRN

## 2018-10-19 NOTE — Telephone Encounter (Signed)
Pt is requesting medication refill.

## 2018-10-20 DIAGNOSIS — N318 Other neuromuscular dysfunction of bladder: Secondary | ICD-10-CM | POA: Diagnosis not present

## 2018-10-25 ENCOUNTER — Ambulatory Visit (INDEPENDENT_AMBULATORY_CARE_PROVIDER_SITE_OTHER): Payer: Medicare Other | Admitting: Podiatry

## 2018-10-25 ENCOUNTER — Encounter: Payer: Self-pay | Admitting: Podiatry

## 2018-10-25 DIAGNOSIS — L84 Corns and callosities: Secondary | ICD-10-CM

## 2018-10-25 DIAGNOSIS — E1142 Type 2 diabetes mellitus with diabetic polyneuropathy: Secondary | ICD-10-CM

## 2018-10-25 DIAGNOSIS — M79676 Pain in unspecified toe(s): Secondary | ICD-10-CM

## 2018-10-25 DIAGNOSIS — B351 Tinea unguium: Secondary | ICD-10-CM | POA: Diagnosis not present

## 2018-10-25 NOTE — Patient Instructions (Signed)

## 2018-10-25 NOTE — Progress Notes (Signed)
Subjective: [name] is a 57 year old African-American female who presents to clinic today for diabetic foot care.  She also has multiple sclerosis.  She complains of painful elongated mycotic toenails which cause pain in shoe gear.  She is unable to trim her nails.  She also complains of painful callus noted on the plantar aspect of her left foot.  Objective: Neurovascular status is unchanged bilaterally.  Toenails 1 through 5 bilaterally are elongated, thickened, with subungual debris.  There is a hyperkeratotic lesion noted on the plantar aspect sub-met 5 left foot.  There is no erythema, no edema, no drainage, no warmth noted.  Assessment: 1.  Onychomycosis toenails 1 through 5 bilaterally 2.  Callus sub-met head 5 left foot 3.  Multiple sclerosis 4.  Diabetes with neuropathy  Plan: 1.  Toenails 1 through 5 bilaterally were debrided in length and girth. 2.  Callus debrided sub-met head 5 left foot. 3.  Continue soft supportive shoe gear daily.  Report any pedal injuries to medical professional immediately. 4.  Follow-up 3 months.  Patient to call should be any concern in the interim.

## 2018-10-27 ENCOUNTER — Encounter (HOSPITAL_BASED_OUTPATIENT_CLINIC_OR_DEPARTMENT_OTHER): Payer: Medicare Other | Attending: Internal Medicine

## 2018-10-27 DIAGNOSIS — Z993 Dependence on wheelchair: Secondary | ICD-10-CM | POA: Insufficient documentation

## 2018-10-27 DIAGNOSIS — G35 Multiple sclerosis: Secondary | ICD-10-CM | POA: Insufficient documentation

## 2018-10-27 DIAGNOSIS — L89313 Pressure ulcer of right buttock, stage 3: Secondary | ICD-10-CM | POA: Insufficient documentation

## 2018-10-27 DIAGNOSIS — L89319 Pressure ulcer of right buttock, unspecified stage: Secondary | ICD-10-CM | POA: Diagnosis not present

## 2018-11-08 ENCOUNTER — Telehealth: Payer: Self-pay | Admitting: Family Medicine

## 2018-11-08 NOTE — Telephone Encounter (Signed)
Called and spoke with pt regarding their appt with Dr. Nolon Rod on 01/24/19. Due to [facility] schedule change, I was able to get pt rescheduled to 01/24/19 at 11:00 AM. I advised of time, building and late policy. Pt acknowledged.

## 2018-11-10 ENCOUNTER — Other Ambulatory Visit (HOSPITAL_COMMUNITY)
Admission: RE | Admit: 2018-11-10 | Discharge: 2018-11-10 | Disposition: A | Discharge status: Home / Self Care | Payer: Medicare Other | Source: Ambulatory Visit | Attending: Obstetrics and Gynecology | Admitting: Obstetrics and Gynecology

## 2018-11-10 ENCOUNTER — Encounter: Payer: Self-pay | Admitting: Obstetrics and Gynecology

## 2018-11-10 ENCOUNTER — Ambulatory Visit (INDEPENDENT_AMBULATORY_CARE_PROVIDER_SITE_OTHER): Payer: Medicare Other | Admitting: Obstetrics and Gynecology

## 2018-11-10 VITALS — BP 126/71 | HR 87 | Wt 200.0 lb

## 2018-11-10 DIAGNOSIS — Z124 Encounter for screening for malignant neoplasm of cervix: Secondary | ICD-10-CM

## 2018-11-10 DIAGNOSIS — N950 Postmenopausal bleeding: Secondary | ICD-10-CM | POA: Insufficient documentation

## 2018-11-10 DIAGNOSIS — Z01411 Encounter for gynecological examination (general) (routine) with abnormal findings: Secondary | ICD-10-CM | POA: Insufficient documentation

## 2018-11-10 NOTE — Progress Notes (Signed)
Subjective:     [name] is a 57 y.o. female P3 postmenopausal for 10 years who is here for a comprehensive physical exam. The patient reports a 71-month history of vaginal bleeding. Patient reports vaginal bleeding lasting 7 days describing it as some spotting and some heavier flow with passage of clots. Patient also reports some vaginal pain not associated with the bleeding. She reports that the vaginal pain has been present for several years. Patient is not sexually active. Patient was noted to have a fibroid uterus in 2018 CT  Past Medical History:  Diagnosis Date  . Arthritis   . Decubitus ulcers   . Diabetes mellitus without complication ([place])   . Hypertension   . MS (multiple sclerosis) ([place])    Past Surgical History:  Procedure Laterality Date  . ESOPHAGOGASTRODUODENOSCOPY N/A 12/02/2017   Procedure: ESOPHAGOGASTRODUODENOSCOPY (EGD);  Surgeon: Ronnette Juniper, MD;  Location: [place];  Service: Gastroenterology;  Laterality: N/A;  . SUPRAPUBIC CATHETER INSERTION  2009  . TONSILLECTOMY    . TUBAL LIGATION     Family History  Problem Relation Age of Onset  . Cancer Mother   . Hypertension Mother   . Heart disease Father   . Diabetes Father   . Hypertension Sister   . Hypertension Brother     Social History   Socioeconomic History  . Marital status: Single    Spouse name: Not on file  . Number of children: Not on file  . Years of education: Not on file  . Highest education level: Not on file  Occupational History  . Not on file  Social Needs  . Financial resource strain: Not on file  . Food insecurity:    Worry: Not on file    Inability: Not on file  . Transportation needs:    Medical: Not on file    Non-medical: Not on file  Tobacco Use  . Smoking status: Never Smoker  . Smokeless tobacco: Never Used  Substance and Sexual Activity  . Alcohol use: No  . Drug use: No  . Sexual activity: Not on file  Lifestyle  . Physical activity:    Days per week: Not  on file    Minutes per session: Not on file  . Stress: Not on file  Relationships  . Social connections:    Talks on phone: Not on file    Gets together: Not on file    Attends religious service: Not on file    Active member of club or organization: Not on file    Attends meetings of clubs or organizations: Not on file    Relationship status: Not on file  . Intimate partner violence:    Fear of current or ex partner: Not on file    Emotionally abused: Not on file    Physically abused: Not on file    Forced sexual activity: Not on file  Other Topics Concern  . Not on file  Social History Narrative  . Not on file   Health Maintenance  Topic Date Due  . Hepatitis C Screening  [birth year]  . PNEUMOCOCCAL POLYSACCHARIDE VACCINE AGE 27-64 HIGH RISK  01/09/1962  . OPHTHALMOLOGY EXAM  01/09/1970  . HIV Screening  01/09/1975  . TETANUS/TDAP  01/09/1979  . PAP SMEAR  01/09/1981  . COLONOSCOPY  01/09/2010  . URINE MICROALBUMIN  12/15/2017  . HEMOGLOBIN A1C  07/04/2018  . INFLUENZA VACCINE  07/06/2018  . MAMMOGRAM  04/14/2019  . FOOT EXAM  07/20/2019  Review of Systems Pertinent items are noted in HPI.   Objective:  Blood pressure 126/71, pulse 87, weight 200 lb (90.7 kg).     GENERAL: Well-developed, well-nourished female in no acute distress. Obese. Patient is not ambulatory HEENT: Normocephalic, atraumatic. Sclerae anicteric.  NECK: Supple. Normal thyroid.  LUNGS: Clear to auscultation bilaterally.  HEART: Regular rate and rhythm. BREASTS: Symmetric in size. No palpable masses or lymphadenopathy, skin changes, or nipple drainage. ABDOMEN: Soft, nontender, nondistended. No organomegaly. Patient with suprapubic catheter in place intact PELVIC: Normal external female genitalia. Vagina is pink and rugated.  Normal discharge. Bulky appearing cervix, friable to touch. Uterus size difficult to assess due to body habitus. No adnexal tenderness. EXTREMITIES: No cyanosis,  clubbing, or edema, 2+ distal pulses.    Assessment:     Routine female exam with postmenopausal vaginal bleeding.      Plan:    pap smear collected Vaginal cultures collected Screening mammogram ordered Pelvic ultrasound ordered to evaluate endometrium and fibroids ENDOMETRIAL BIOPSY     The indications for endometrial biopsy were reviewed.   Risks of the biopsy including cramping, bleeding, infection, uterine perforation, inadequate specimen and need for additional procedures  were discussed. The patient states she understands and agrees to undergo procedure today. Consent was signed. Time out was performed. Urine HCG was negative. A sterile speculum was placed in the patient's vagina and the cervix was prepped with Betadine. A single-toothed tenaculum was placed on the anterior lip of the cervix to stabilize it. The uterine cavity was sounded to a depth of 15 cm using the uterine sound. The 3 mm pipelle was introduced into the endometrial cavity without difficulty, 2 passes were made.  A  moderate amount of tissue was  sent to pathology. The instruments were removed from the patient's vagina. Minimal bleeding from the cervix was noted. The patient tolerated the procedure well.  Routine post-procedure instructions were given to the patient. The patient will follow up in two weeks to review the results and for further management.    See After Visit Summary for Counseling Recommendations

## 2018-11-13 LAB — CERVICOVAGINAL ANCILLARY ONLY
Bacterial vaginitis: NEGATIVE
CANDIDA VAGINITIS: NEGATIVE
TRICH (WINDOWPATH): NEGATIVE

## 2018-11-14 ENCOUNTER — Encounter: Payer: Self-pay | Admitting: Obstetrics and Gynecology

## 2018-11-14 ENCOUNTER — Other Ambulatory Visit: Payer: Self-pay

## 2018-11-14 DIAGNOSIS — C541 Malignant neoplasm of endometrium: Secondary | ICD-10-CM

## 2018-11-14 NOTE — Progress Notes (Signed)
Contacted patient on listed home and mobile number without success to discuss results of endometrial biopsy. Called patient's contact Dineen Conradt (daughter) but received a voicemail message.  Will try again later today

## 2018-11-14 NOTE — Progress Notes (Signed)
Patient was contacted on her listed home number. Discussed pathology results which demonstrated endometrial adenocarcinoma FIGO grade 2  Endometrial biopsy Endometrium, biopsy - ENDOMETRIAL ADENOCARCINOMA, SEE COMMENT.  Emotional support was provided Answered all questions Patient will be referred to GYN ONC to discuss further management Patient to keep pelvic ultrasound appointment Patient was made aware that I can be contacted with questions until her [place] appointment Pap smear result not yet available

## 2018-11-15 ENCOUNTER — Ambulatory Visit (HOSPITAL_COMMUNITY)
Admission: RE | Admit: 2018-11-15 | Discharge: 2018-11-15 | Disposition: A | Discharge status: Home / Self Care | Payer: Medicare Other | Source: Ambulatory Visit | Attending: Obstetrics and Gynecology | Admitting: Obstetrics and Gynecology

## 2018-11-15 ENCOUNTER — Telehealth: Payer: Self-pay

## 2018-11-15 DIAGNOSIS — D259 Leiomyoma of uterus, unspecified: Secondary | ICD-10-CM | POA: Insufficient documentation

## 2018-11-15 DIAGNOSIS — N950 Postmenopausal bleeding: Secondary | ICD-10-CM | POA: Diagnosis not present

## 2018-11-15 NOTE — Progress Notes (Unsigned)
Called GYN Oncology to schedule appt for pt, they stated dr's are reviewing her records & will call us back with appt date & time. Will advise pt also.

## 2018-11-15 NOTE — Telephone Encounter (Addendum)
-----   Message from Mora Bellman, MD sent at 11/14/2018  3:33 PM EST ----- Patient informed of endometrial cancer diagnosis. Please schedule patient ASAP with gyn oncology.  Thank you   [facility]ology scheduled for 11/21/18 @ 1430.  GYN oncology will call pt with appt.

## 2018-11-16 ENCOUNTER — Telehealth: Payer: Self-pay

## 2018-11-16 ENCOUNTER — Telehealth: Payer: Self-pay | Admitting: *Deleted

## 2018-11-16 NOTE — Telephone Encounter (Signed)
Rcvd call from [facility] at [facility] stating pts appointment is scheduled on 11/21/18 @ 2:30pm, asked if pt can arrive at 2pm. Called pt. To advise of appointment, she asked if it could be mailed to her also. Offered to send by My Chart, pt states cannot access.Letter will be sent.

## 2018-11-16 NOTE — Telephone Encounter (Signed)
Called and spoke with Olin Hauser at Dr. Elly Modena office. Gave her the patient date/time for the appt on 12/17 at 2:30 pm

## 2018-11-17 DIAGNOSIS — N318 Other neuromuscular dysfunction of bladder: Secondary | ICD-10-CM | POA: Diagnosis not present

## 2018-11-21 ENCOUNTER — Inpatient Hospital Stay: Payer: Medicare Other | Attending: Gynecologic Oncology | Admitting: Gynecologic Oncology

## 2018-11-21 ENCOUNTER — Encounter: Payer: Self-pay | Admitting: Gynecologic Oncology

## 2018-11-21 VITALS — BP 129/76 | HR 81 | Temp 98.5°F | Resp 18

## 2018-11-21 DIAGNOSIS — C541 Malignant neoplasm of endometrium: Secondary | ICD-10-CM | POA: Insufficient documentation

## 2018-11-21 DIAGNOSIS — G35 Multiple sclerosis: Secondary | ICD-10-CM

## 2018-11-21 LAB — CYTOLOGY - PAP: HPV: NOT DETECTED

## 2018-11-21 NOTE — Progress Notes (Signed)
Consult Note: Gyn-Onc  Consult was requested by Dr. Mora Bellman for the evaluation of [name] 57 y.o. female  CC:  Chief Complaint  Patient presents with  . endometrial cancer    Assessment/Plan:  [name]  is a 57 y.o.  year old with Alexandria (wheelchair bound) with grade 2 endometrial cancer.  A detailed discussion was held with the patient and her family with regard to to her endometrial cancer diagnosis. We discussed the standard management options for uterine cancer which includes surgery followed possibly by adjuvant therapy depending on the results of surgery. The options for surgical management include a hysterectomy and removal of the tubes and ovaries possibly with removal of pelvic and para-aortic lymph nodes.If feasible, a minimally invasive approach including a robotic hysterectomy or laparoscopic hysterectomy have benefits including shorter hospital stay, recovery time and better wound healing than with open surgery. The patient has been counseled about these surgical options and the risks of surgery in general including infection, bleeding, damage to surrounding structures (including bowel, bladder, ureters, nerves or vessels), and the postoperative risks of PE/ DVT, and lymphedema. I extensively reviewed the additional risks of robotic hysterectomy including possible need for conversion to open laparotomy.  I discussed positioning during surgery of trendelenberg and risks of minor facial swelling and care we take in preoperative positioning. She is at particular risk for complications given her Alexandria.  I discussed that she is at increased risk for aspiration, and worsening of constipation.    After counseling and consideration of her options, she desires to proceed with robotic assisted total hysterectomy with bilateral sapingo-oophorectomy and SLN biopsy.   She will be seen by anesthesia for preoperative clearance and discussion of postoperative pain management.  She was  given the opportunity to ask questions, which were answered to her satisfaction, and she is agreement with the above mentioned plan of care.   HPI: [name] is a 57 year old P1 who is seen in consultation at the request of Dr Elly Modena for grade 2 endometrial cancer.  The patient reports a history of vaginal bleeding for 6 months (since the summer of 2019).  Formed a transvaginal ultrasound scan on November 15, 2018 which revealed a uterus measuring 11.5 x 7.2 x 7.3 cm with a posterior lower uterine segment myoma measuring approximately 6 cm.  The endometrium was thickened to 34 mm.  The left and right ovaries were not clearly visualized.  An endometrial biopsy was then taken in the office which revealed FIGO grade 2 endometrioid adenocarcinoma.  The patient has a medical history significant for multiple sclerosis.  She is wheelchair-bound.  She has a suprapubic catheter.  She has spasms and no function in her lower extremities.  She has multiple decubitus ulcers.  She lives with her daughter.  She is independent through the day in her motorized wheelchair.  She requires Hoyer lift for transfers.  Her associated medical history significant for hypertension, diabetes mellitus on oral medications, and arthritis  Her family history is significant for a mother who had a history of breast cancer.  A suprapubic catheter was placed in 2009 and then later replaced.  She has had no other prior abdominal surgeries.  Her neurologist is Dr. Felecia Shelling. Endometrial biopsy was performed on 11/10/18 which showed FIGO grade 2 endometrial cancer. [place] on November 15, 2018 showed a uterus measuring 11.5 x 7.2 x 7.3 cm with a 5 cm posterior leiomyoma in the mid to lower uterine segment.  There was  a thickened endometrium at 34 mm.  The ovaries were not visualized.  There is no free fluid.  Current Meds:   Current Outpatient Medications:  .  Adhesive Tape (PAPER TAPE 1"X10YD) TAPE, Apply to dressings as needed, Disp: 4  each, Rfl: 6 .  atorvastatin (LIPITOR) 10 MG tablet, Take 10 mg by mouth daily. , Disp: , Rfl:  .  baclofen (LIORESAL) 20 MG tablet, TAKE 1 TABLET BY MOUTH FOUR TIMES A DAY**OFFICE VISIT NEEDED**, Disp: 120 tablet, Rfl: 0 .  Catheters (DOVER UNIVERSAL FOLEY TRAY) KIT, Use foley tray kit to change catheter every 3 weeks or as needed, Disp: 15 each, Rfl: 1 .  Catheters (FOLEY CATHETER 2-WAY) MISC, Use to aspirate urine suprapubic, Disp: 15 each, Rfl: 1 .  Cholecalciferol (D3-1000) 1000 units capsule, Take 2,000 Units by mouth daily. , Disp: , Rfl:  .  Control Gel Formula Dressing (DUODERM CGF DRESSING) MISC, Apply 1 each topically daily as needed., Disp: 5 each, Rfl: 11 .  cyanocobalamin 500 MCG tablet, Take 500 mcg by mouth daily., Disp: , Rfl:  .  Disposable Gloves MISC, Use disposable gloves to change catheter as needed, Disp: 200 each, Rfl: 11 .  furosemide (LASIX) 20 MG tablet, Take 1 tablet (20 mg total) by mouth daily., Disp: 90 tablet, Rfl: 0 .  glipiZIDE (GLUCOTROL) 5 MG tablet, Take 1 tablet (5 mg total) by mouth daily before lunch., Disp: 90 tablet, Rfl: 3 .  hydrochlorothiazide (HYDRODIURIL) 25 MG tablet, TAKE 1 TABLET BY MOUTH EVERY MORNING, Disp: 90 tablet, Rfl: 0 .  Incontinence Supply Disposable (TENA FLEX 16 PLUS) MISC, Use daily as needed, Disp: 30 each, Rfl: 11 .  Incontinence Supply Disposable (TENA PROTECT UNDERWEAR PLS/XLG) MISC, Use for incontinence., Disp: 30 each, Rfl: 11 .  Interferon Beta-1b (BETASERON) 0.3 MG KIT injection, Inject 0.3 mg into the skin every other day., Disp: 36 each, Rfl: 3 .  lidocaine (XYLOCAINE) 2 % jelly, USE when need with dressings, Disp: , Rfl: 1 .  metFORMIN (GLUCOPHAGE) 1000 MG tablet, Take 1,000 mg by mouth 2 (two) times daily with a meal., Disp: , Rfl:  .  Methenamine-Sodium Salicylate (AZO URINARY TRACT DEFENSE) 162-162.5 MG TABS, Take as instructed, Disp: , Rfl:  .  MOVANTIK 25 MG TABS tablet, Take 25 mg by mouth every morning., Disp: , Rfl:  4 .  NON FORMULARY, Power wheelchair repairs  Diagnosis code z99.3, Disp: 1 each, Rfl: 0 .  NON FORMULARY, Dispense catheter urine drainage bags, Disp: 90 Bag, Rfl: 3 .  nystatin ointment (MYCOSTATIN), Apply to affected area twice daily as needed for irritation, Disp: 30 g, Rfl: 6 .  Ostomy Supplies (NEW IMAGE SKIN/FLANGE/TAPE) MISC, 1 application by Does not apply route daily as needed., Disp: 30 each, Rfl: 11 .  oxyCODONE (OXYCONTIN) 20 mg 12 hr tablet, Take 1 tablet (20 mg total) by mouth 3 (three) times daily as needed (pain)., Disp: 90 tablet, Rfl: 0 .  oxyCODONE-acetaminophen (PERCOCET) 10-325 MG tablet, Take 1 tablet by mouth every 8 (eight) hours as needed for pain. Needs office visit., Disp: 90 tablet, Rfl: 0 .  potassium chloride (K-DUR,KLOR-CON) 10 MEQ tablet, TAKE 3 TABLETS BY MOUTH TWICE DAILY**OFFICE VISIT NEEDED FOR REFILLS**, Disp: 180 tablet, Rfl: 0 .  Syringe, Disposable, (B-D SYRINGE LUER-LOK 30CC) 30 ML MISC, Use to change catheter, Disp: 15 each, Rfl: 0 .  Wound Dressings (GRX HYDROGEL GAUZE 4X4) PADS, Use gauze to clean wound daily as needed, Disp: 200 each, Rfl: 11  Allergy: No Known Allergies  Social Hx:   Social History   Socioeconomic History  . Marital status: Single    Spouse name: Not on file  . Number of children: Not on file  . Years of education: Not on file  . Highest education level: Not on file  Occupational History  . Not on file  Social Needs  . Financial resource strain: Not on file  . Food insecurity:    Worry: Not on file    Inability: Not on file  . Transportation needs:    Medical: Not on file    Non-medical: Not on file  Tobacco Use  . Smoking status: Never Smoker  . Smokeless tobacco: Never Used  Substance and Sexual Activity  . Alcohol use: No  . Drug use: No  . Sexual activity: Not on file  Lifestyle  . Physical activity:    Days per week: Not on file    Minutes per session: Not on file  . Stress: Not on file  Relationships   . Social connections:    Talks on phone: Not on file    Gets together: Not on file    Attends religious service: Not on file    Active member of club or organization: Not on file    Attends meetings of clubs or organizations: Not on file    Relationship status: Not on file  . Intimate partner violence:    Fear of current or ex partner: Not on file    Emotionally abused: Not on file    Physically abused: Not on file    Forced sexual activity: Not on file  Other Topics Concern  . Not on file  Social History Narrative  . Not on file    Past Surgical Hx:  Past Surgical History:  Procedure Laterality Date  . ESOPHAGOGASTRODUODENOSCOPY N/A 12/02/2017   Procedure: ESOPHAGOGASTRODUODENOSCOPY (EGD);  Surgeon: Ronnette Juniper, MD;  Location: [facility];  Service: Gastroenterology;  Laterality: N/A;  . SUPRAPUBIC CATHETER INSERTION  2009  . TONSILLECTOMY    . TUBAL LIGATION      Past Medical Hx:  Past Medical History:  Diagnosis Date  . Arthritis   . Decubitus ulcers   . Diabetes mellitus without complication ([place])   . Hypertension   . [place] (multiple sclerosis) ([place])     Past Gynecological History:  See HPI No LMP recorded. Patient is postmenopausal.  Family Hx:  Family History  Problem Relation Age of Onset  . Cancer Mother   . Hypertension Mother   . Heart disease Father   . Diabetes Father   . Hypertension Sister   . Hypertension Brother     Review of Systems:  Constitutional  Feels well,   ENT Normal appearing ears and nares bilaterally Skin/Breast  No rash, sores, jaundice, itching, dryness Cardiovascular  No chest pain, shortness of breath, or edema  Pulmonary  No cough or wheeze.  Gastro Intestinal  No nausea, vomitting, or diarrhoea. No bright red blood per rectum, no abdominal pain,+constipation.  Genito Urinary  No frequency, urgency, dysuria, + bleeding Musculo Skeletal  No myalgia, arthralgia, joint swelling or pain  Neurologic  + spastic paralysis  lower extremities Psychology  No depression, anxiety, insomnia.   Vitals:  Blood pressure 129/76, pulse 81, temperature 98.5 F (36.9 C), temperature source Oral, resp. rate 18, SpO2 99 %.  Physical Exam: WD in NAD Neck  Supple NROM, without any enlargements.  Lymph Node Survey No cervical supraclavicular or inguinal adenopathy Cardiovascular  Pulse normal rate, regularity and rhythm. S1 and S2 normal.  Lungs  Clear to auscultation bilateraly, without wheezes/crackles/rhonchi. Good air movement.  Skin  No rash/lesions/breakdown  Psychiatry  Alert and oriented to person, place, and time  Abdomen  Normoactive bowel sounds, abdomen soft, non-tender and obese without evidence of hernia. Suprapubic catheter sites present. Back No CVA tenderness Genito Urinary  Vulva/vagina: Normal external female genitalia.  No lesions. No discharge or bleeding.  Bladder/urethra:  No lesions or masses, well supported bladder  Vagina: normal, some loss of support.   Cervix: Normal appearing, no lesions.  Uterus: Bulky, firm round posterior lower uterine segment fibroid, mobile, no parametrial involvement or nodularity.  Adnexa: no discretely palpable masses. Rectal  Good tone, no masses no cul de sac nodularity.  Extremities  No bilateral cyanosis, clubbing or edema.   Thereasa Solo, MD  11/21/2018, 5:18 PM

## 2018-11-21 NOTE — H&P (View-Only) (Signed)
Consult Note: Gyn-Onc  Consult was requested by Dr. Peggy Constant for the evaluation of [name] 57 y.o. female  CC:  Chief Complaint  Patient presents with  . endometrial cancer    Assessment/Plan:  [name]  is a 57 y.o.  year old with MS (wheelchair bound) with grade 2 endometrial cancer.  A detailed discussion was held with the patient and her family with regard to to her endometrial cancer diagnosis. We discussed the standard management options for uterine cancer which includes surgery followed possibly by adjuvant therapy depending on the results of surgery. The options for surgical management include a hysterectomy and removal of the tubes and ovaries possibly with removal of pelvic and para-aortic lymph nodes.If feasible, a minimally invasive approach including a robotic hysterectomy or laparoscopic hysterectomy have benefits including shorter hospital stay, recovery time and better wound healing than with open surgery. The patient has been counseled about these surgical options and the risks of surgery in general including infection, bleeding, damage to surrounding structures (including bowel, bladder, ureters, nerves or vessels), and the postoperative risks of PE/ DVT, and lymphedema. I extensively reviewed the additional risks of robotic hysterectomy including possible need for conversion to open laparotomy.  I discussed positioning during surgery of trendelenberg and risks of minor facial swelling and care we take in preoperative positioning. She is at particular risk for complications given her MS.  I discussed that she is at increased risk for aspiration, and worsening of constipation.    After counseling and consideration of her options, she desires to proceed with robotic assisted total hysterectomy with bilateral sapingo-oophorectomy and SLN biopsy.   She will be seen by anesthesia for preoperative clearance and discussion of postoperative pain management.  She was  given the opportunity to ask questions, which were answered to her satisfaction, and she is agreement with the above mentioned plan of care.   HPI: [name] is a 57 year old P1 who is seen in consultation at the request of Dr Constant for grade 2 endometrial cancer.  The patient reports a history of vaginal bleeding for 6 months (since the summer of 2019).  Formed a transvaginal ultrasound scan on November 15, 2018 which revealed a uterus measuring 11.5 x 7.2 x 7.3 cm with a posterior lower uterine segment myoma measuring approximately 6 cm.  The endometrium was thickened to 34 mm.  The left and right ovaries were not clearly visualized.  An endometrial biopsy was then taken in the office which revealed FIGO grade 2 endometrioid adenocarcinoma.  The patient has a medical history significant for multiple sclerosis.  She is wheelchair-bound.  She has a suprapubic catheter.  She has spasms and no function in her lower extremities.  She has multiple decubitus ulcers.  She lives with her daughter.  She is independent through the day in her motorized wheelchair.  She requires Hoyer lift for transfers.  Her associated medical history significant for hypertension, diabetes mellitus on oral medications, and arthritis  Her family history is significant for a mother who had a history of breast cancer.  A suprapubic catheter was placed in 2009 and then later replaced.  She has had no other prior abdominal surgeries.  Her neurologist is Dr. Sater. Endometrial biopsy was performed on 11/10/18 which showed FIGO grade 2 endometrial cancer. US on November 15, 2018 showed a uterus measuring 11.5 x 7.2 x 7.3 cm with a 5 cm posterior leiomyoma in the mid to lower uterine segment.  There was   a thickened endometrium at 34 mm.  The ovaries were not visualized.  There is no free fluid.  Current Meds:   Current Outpatient Medications:  .  Adhesive Tape (PAPER TAPE 1"X10YD) TAPE, Apply to dressings as needed, Disp: 4  each, Rfl: 6 .  atorvastatin (LIPITOR) 10 MG tablet, Take 10 mg by mouth daily. , Disp: , Rfl:  .  baclofen (LIORESAL) 20 MG tablet, TAKE 1 TABLET BY MOUTH FOUR TIMES A DAY**OFFICE VISIT NEEDED**, Disp: 120 tablet, Rfl: 0 .  Catheters (DOVER UNIVERSAL FOLEY TRAY) KIT, Use foley tray kit to change catheter every 3 weeks or as needed, Disp: 15 each, Rfl: 1 .  Catheters (FOLEY CATHETER 2-WAY) MISC, Use to aspirate urine suprapubic, Disp: 15 each, Rfl: 1 .  Cholecalciferol (D3-1000) 1000 units capsule, Take 2,000 Units by mouth daily. , Disp: , Rfl:  .  Control Gel Formula Dressing (DUODERM CGF DRESSING) MISC, Apply 1 each topically daily as needed., Disp: 5 each, Rfl: 11 .  cyanocobalamin 500 MCG tablet, Take 500 mcg by mouth daily., Disp: , Rfl:  .  Disposable Gloves MISC, Use disposable gloves to change catheter as needed, Disp: 200 each, Rfl: 11 .  furosemide (LASIX) 20 MG tablet, Take 1 tablet (20 mg total) by mouth daily., Disp: 90 tablet, Rfl: 0 .  glipiZIDE (GLUCOTROL) 5 MG tablet, Take 1 tablet (5 mg total) by mouth daily before lunch., Disp: 90 tablet, Rfl: 3 .  hydrochlorothiazide (HYDRODIURIL) 25 MG tablet, TAKE 1 TABLET BY MOUTH EVERY MORNING, Disp: 90 tablet, Rfl: 0 .  Incontinence Supply Disposable (TENA FLEX 16 PLUS) MISC, Use daily as needed, Disp: 30 each, Rfl: 11 .  Incontinence Supply Disposable (TENA PROTECT UNDERWEAR PLS/XLG) MISC, Use for incontinence., Disp: 30 each, Rfl: 11 .  Interferon Beta-1b (BETASERON) 0.3 MG KIT injection, Inject 0.3 mg into the skin every other day., Disp: 36 each, Rfl: 3 .  lidocaine (XYLOCAINE) 2 % jelly, USE when need with dressings, Disp: , Rfl: 1 .  metFORMIN (GLUCOPHAGE) 1000 MG tablet, Take 1,000 mg by mouth 2 (two) times daily with a meal., Disp: , Rfl:  .  Methenamine-Sodium Salicylate (AZO URINARY TRACT DEFENSE) 162-162.5 MG TABS, Take as instructed, Disp: , Rfl:  .  MOVANTIK 25 MG TABS tablet, Take 25 mg by mouth every morning., Disp: , Rfl:  4 .  NON FORMULARY, Power wheelchair repairs  Diagnosis code z99.3, Disp: 1 each, Rfl: 0 .  NON FORMULARY, Dispense catheter urine drainage bags, Disp: 90 Bag, Rfl: 3 .  nystatin ointment (MYCOSTATIN), Apply to affected area twice daily as needed for irritation, Disp: 30 g, Rfl: 6 .  Ostomy Supplies (NEW IMAGE SKIN/FLANGE/TAPE) MISC, 1 application by Does not apply route daily as needed., Disp: 30 each, Rfl: 11 .  oxyCODONE (OXYCONTIN) 20 mg 12 hr tablet, Take 1 tablet (20 mg total) by mouth 3 (three) times daily as needed (pain)., Disp: 90 tablet, Rfl: 0 .  oxyCODONE-acetaminophen (PERCOCET) 10-325 MG tablet, Take 1 tablet by mouth every 8 (eight) hours as needed for pain. Needs office visit., Disp: 90 tablet, Rfl: 0 .  potassium chloride (K-DUR,KLOR-CON) 10 MEQ tablet, TAKE 3 TABLETS BY MOUTH TWICE DAILY**OFFICE VISIT NEEDED FOR REFILLS**, Disp: 180 tablet, Rfl: 0 .  Syringe, Disposable, (B-D SYRINGE LUER-LOK 30CC) 30 ML MISC, Use to change catheter, Disp: 15 each, Rfl: 0 .  Wound Dressings (GRX HYDROGEL GAUZE 4X4) PADS, Use gauze to clean wound daily as needed, Disp: 200 each, Rfl: 11     Allergy: No Known Allergies  Social Hx:   Social History   Socioeconomic History  . Marital status: Single    Spouse name: Not on file  . Number of children: Not on file  . Years of education: Not on file  . Highest education level: Not on file  Occupational History  . Not on file  Social Needs  . Financial resource strain: Not on file  . Food insecurity:    Worry: Not on file    Inability: Not on file  . Transportation needs:    Medical: Not on file    Non-medical: Not on file  Tobacco Use  . Smoking status: Never Smoker  . Smokeless tobacco: Never Used  Substance and Sexual Activity  . Alcohol use: No  . Drug use: No  . Sexual activity: Not on file  Lifestyle  . Physical activity:    Days per week: Not on file    Minutes per session: Not on file  . Stress: Not on file  Relationships   . Social connections:    Talks on phone: Not on file    Gets together: Not on file    Attends religious service: Not on file    Active member of club or organization: Not on file    Attends meetings of clubs or organizations: Not on file    Relationship status: Not on file  . Intimate partner violence:    Fear of current or ex partner: Not on file    Emotionally abused: Not on file    Physically abused: Not on file    Forced sexual activity: Not on file  Other Topics Concern  . Not on file  Social History Narrative  . Not on file    Past Surgical Hx:  Past Surgical History:  Procedure Laterality Date  . ESOPHAGOGASTRODUODENOSCOPY N/A 12/02/2017   Procedure: ESOPHAGOGASTRODUODENOSCOPY (EGD);  Surgeon: Karki, Arya, MD;  Location: [facility];  Service: Gastroenterology;  Laterality: N/A;  . SUPRAPUBIC CATHETER INSERTION  2009  . TONSILLECTOMY    . TUBAL LIGATION      Past Medical Hx:  Past Medical History:  Diagnosis Date  . Arthritis   . Decubitus ulcers   . Diabetes mellitus without complication ([facility])   . Hypertension   . MS (multiple sclerosis) ([facility])     Past Gynecological History:  See HPI No LMP recorded. Patient is postmenopausal.  Family Hx:  Family History  Problem Relation Age of Onset  . Cancer Mother   . Hypertension Mother   . Heart disease Father   . Diabetes Father   . Hypertension Sister   . Hypertension Brother     Review of Systems:  Constitutional  Feels well,   ENT Normal appearing ears and nares bilaterally Skin/Breast  No rash, sores, jaundice, itching, dryness Cardiovascular  No chest pain, shortness of breath, or edema  Pulmonary  No cough or wheeze.  Gastro Intestinal  No nausea, vomitting, or diarrhoea. No bright red blood per rectum, no abdominal pain,+constipation.  Genito Urinary  No frequency, urgency, dysuria, + bleeding Musculo Skeletal  No myalgia, arthralgia, joint swelling or pain  Neurologic  + spastic paralysis  lower extremities Psychology  No depression, anxiety, insomnia.   Vitals:  Blood pressure 129/76, pulse 81, temperature 98.5 F (36.9 C), temperature source Oral, resp. rate 18, SpO2 99 %.  Physical Exam: WD in NAD Neck  Supple NROM, without any enlargements.  Lymph Node Survey No cervical supraclavicular or inguinal adenopathy Cardiovascular    Pulse normal rate, regularity and rhythm. S1 and S2 normal.  Lungs  Clear to auscultation bilateraly, without wheezes/crackles/rhonchi. Good air movement.  Skin  No rash/lesions/breakdown  Psychiatry  Alert and oriented to person, place, and time  Abdomen  Normoactive bowel sounds, abdomen soft, non-tender and obese without evidence of hernia. Suprapubic catheter sites present. Back No CVA tenderness Genito Urinary  Vulva/vagina: Normal external female genitalia.  No lesions. No discharge or bleeding.  Bladder/urethra:  No lesions or masses, well supported bladder  Vagina: normal, some loss of support.   Cervix: Normal appearing, no lesions.  Uterus: Bulky, firm round posterior lower uterine segment fibroid, mobile, no parametrial involvement or nodularity.  Adnexa: no discretely palpable masses. Rectal  Good tone, no masses no cul de sac nodularity.  Extremities  No bilateral cyanosis, clubbing or edema.   [place] Spainhower C Anneth Brunell, MD  11/21/2018, 5:18 PM     

## 2018-11-21 NOTE — Patient Instructions (Signed)
Dr Denman George is recommending hysterectomy, with removal of ovaries, tubes, cervix and lymph nodes.  This will be tentatively scheduled for January, 14th, 2020.  Her office will reach out to you regarding a preoperative testing and counseling appointment in January to discuss the surgery further.   Her office can be reached at [phone].

## 2018-11-22 ENCOUNTER — Other Ambulatory Visit: Payer: Self-pay | Admitting: Family Medicine

## 2018-11-22 DIAGNOSIS — Z79891 Long term (current) use of opiate analgesic: Secondary | ICD-10-CM

## 2018-11-22 DIAGNOSIS — G35 Multiple sclerosis: Secondary | ICD-10-CM

## 2018-11-22 NOTE — Telephone Encounter (Signed)
Copied from [place] #[id number]. Topic: Quick Communication - Rx Refill/Question >> Nov 22, 2018  2:39 PM Waylan Rocher, [place] L wrote: Medication: oxyCODONE-acetaminophen (PERCOCET) 10-325 MG tablet   Has the patient contacted their pharmacy? Yes.   (Agent: If no, request that the patient contact the pharmacy for the refill.) (Agent: If yes, when and what did the pharmacy advise?)  Preferred Pharmacy (with phone number or street name): [facility], [place], [place] [address] [place] [address] Phone: [phone] Fax: [phone]  Agent: Please be advised that RX refills may take up to 3 business days. We ask that you follow-up with your pharmacy.

## 2018-11-22 NOTE — Telephone Encounter (Signed)
Requested medication (s) are due for refill today -yes  Requested medication (s) are on the active medication list -yes  Future visit scheduled -yes  Last refill: 10/19/18  Notes to clinic: Patient is requesting a non-delegated Rx- sent for provider review  Requested Prescriptions  Pending Prescriptions Disp Refills   oxyCODONE-acetaminophen (PERCOCET) 10-325 MG tablet 90 tablet 0    Sig: Take 1 tablet by mouth every 8 (eight) hours as needed for pain. Needs office visit.     Not Delegated - Analgesics:  Opioid Agonist Combinations Failed - 11/22/2018  2:43 PM      Failed - This refill cannot be delegated      Failed - Urine Drug Screen completed in last 360 days.      Passed - Valid encounter within last 6 months    Recent Outpatient Visits          4 months ago Chronically on opiate therapy   Primary Care at [facility] [place] A, MD   10 months ago Type 2 diabetes mellitus with complication, without long-term current use of insulin ([place])   Primary Care at [facility], [name], MD   1 year ago Screening for cervical cancer   Primary Care at [facility], [name], MD   1 year ago Essential hypertension   Primary Care at [facility], [name], MD   1 year ago Indwelling Foley catheter present   Primary Care at [place], [place]      Future Appointments            In 2 months Forrest Moron, MD Primary Care at [place], [facility]            Requested Prescriptions  Pending Prescriptions Disp Refills   oxyCODONE-acetaminophen (PERCOCET) 10-325 MG tablet 90 tablet 0    Sig: Take 1 tablet by mouth every 8 (eight) hours as needed for pain. Needs office visit.     Not Delegated - Analgesics:  Opioid Agonist Combinations Failed - 11/22/2018  2:43 PM      Failed - This refill cannot be delegated      Failed - Urine Drug Screen completed in last 360 days.      Passed - Valid encounter within last 6 months    Recent Outpatient Visits          4 months  ago Chronically on opiate therapy   Primary Care at [facility] [place] A, MD   10 months ago Type 2 diabetes mellitus with complication, without long-term current use of insulin [facility])   Primary Care at [facility], [name], MD   1 year ago Screening for cervical cancer   Primary Care at [facility], [name], MD   1 year ago Essential hypertension   Primary Care at [facility], [name], MD   1 year ago Indwelling Foley catheter present   Primary Care at [place], MD      Future Appointments            In 2 months Forrest Moron, MD Primary Care at [place], [facility]

## 2018-11-23 ENCOUNTER — Telehealth: Payer: Self-pay | Admitting: Family Medicine

## 2018-11-23 DIAGNOSIS — I10 Essential (primary) hypertension: Secondary | ICD-10-CM

## 2018-11-23 DIAGNOSIS — E1142 Type 2 diabetes mellitus with diabetic polyneuropathy: Secondary | ICD-10-CM

## 2018-11-23 NOTE — Telephone Encounter (Signed)
Med refill approval or deny needed 

## 2018-11-23 NOTE — Telephone Encounter (Signed)
Patient would like a referral    Copied from [place] #[id number]. Topic: General - Other >> Nov 22, 2018  2:38 PM Valla Leaver wrote: Reason for CRM: Patient needs  to know who her eye doctor is. 11 Dr. Nolon Rod referred her but I dont see anything related to this in her chart or referrals.

## 2018-11-23 NOTE — Telephone Encounter (Signed)
Please advise 

## 2018-11-24 ENCOUNTER — Encounter (HOSPITAL_BASED_OUTPATIENT_CLINIC_OR_DEPARTMENT_OTHER): Payer: Medicare Other | Attending: Internal Medicine

## 2018-11-24 DIAGNOSIS — I10 Essential (primary) hypertension: Secondary | ICD-10-CM | POA: Diagnosis not present

## 2018-11-24 DIAGNOSIS — L89312 Pressure ulcer of right buttock, stage 2: Secondary | ICD-10-CM | POA: Insufficient documentation

## 2018-11-24 DIAGNOSIS — E1140 Type 2 diabetes mellitus with diabetic neuropathy, unspecified: Secondary | ICD-10-CM | POA: Diagnosis not present

## 2018-11-24 DIAGNOSIS — L89319 Pressure ulcer of right buttock, unspecified stage: Secondary | ICD-10-CM | POA: Diagnosis not present

## 2018-11-24 DIAGNOSIS — G35 Multiple sclerosis: Secondary | ICD-10-CM | POA: Insufficient documentation

## 2018-11-24 MED ORDER — OXYCODONE-ACETAMINOPHEN 10-325 MG PO TABS: 1.0000 | ORAL_TABLET | Freq: Three times a day (TID) | ORAL | 0 refills | Status: DC | PRN

## 2018-11-24 NOTE — Telephone Encounter (Signed)
Please advise.  Patient is requesting a refill of the following medications: Requested Prescriptions   Pending Prescriptions Disp Refills  . oxyCODONE-acetaminophen (PERCOCET) 10-325 MG tablet 90 tablet 0    Sig: Take 1 tablet by mouth every 8 (eight) hours as needed for pain. Needs office visit.

## 2018-11-25 NOTE — Telephone Encounter (Signed)
Will route to referrals department.  Referrals can you find out if she had a pap smear with Gynecology? I put that referral in but wanted to see if it was done. There was an issue about having a bariatric table to put the patient on for her pap smear. She is wheelchair bound.  Thank you.

## 2018-11-27 NOTE — Telephone Encounter (Signed)
Looks like pt canceled appt with gynecology and ended up refusing service per notes.. pt was asking about her eye doc which we referred her to [facility] not sure who she use to see. Didn't see a referral in for ophthalmology. Thanks

## 2018-12-01 NOTE — Telephone Encounter (Signed)
Pt has referral for optho and states she has appt scheduled.  Denies further needs at this time.

## 2018-12-01 NOTE — Telephone Encounter (Signed)
Pt calling again to find out about her eye doctor referral.  Pt thinks it was to somewhere on [address].

## 2018-12-05 ENCOUNTER — Other Ambulatory Visit: Payer: Self-pay | Admitting: Family Medicine

## 2018-12-11 ENCOUNTER — Encounter (HOSPITAL_COMMUNITY): Payer: Self-pay

## 2018-12-11 NOTE — Patient Instructions (Addendum)
Your procedure is scheduled on: Tuesday, Jan. 14, 2020   Surgery Time:  12Noon-3:00PM   Report to [facility] Main  Entrance    Report to admitting at 9:30 AM   Call this number if you have problems the morning of surgery [phone]   Eat a light diet the day before surgery. Examples including soups, broths, toast, yogurt, mashed potatoes. Things to avoid include carbonated beverages (fizzy beverages), raw fruits and raw vegetables, or beans.    Do not eat food or drink liquids :After Midnight.   Brush your teeth the morning of surgery.   Do NOT smoke after Midnight   Take these medicines the morning of surgery with A SIP OF WATER: None  DO NOT TAKE ANY DIABETIC MEDICATIONS DAY OF YOUR SURGERY                               You may not have any metal on your body including hair pins, jewelry, and body piercings             Do not wear make-up, lotions, powders, perfumes/cologne, or deodorant             Do not wear nail polish.  Do not shave  48 hours prior to surgery.              Do not bring valuables to the hospital. [name].   Contacts, dentures or bridgework may not be worn into surgery.   Leave suitcase in the car. After surgery it may be brought to your room.    Special Instructions: Bring a copy of your healthcare power of attorney and living will documents         the day of surgery if you haven't scanned them in before.              Please read over the following fact sheets you were given:  [facility] - Preparing for Surgery Before surgery, you can play an important role.  Because skin is not sterile, your skin needs to be as free of germs as possible.  You can reduce the number of germs on your skin by washing with CHG (chlorahexidine gluconate) soap before surgery.  CHG is an antiseptic cleaner which kills germs and bonds with the skin to continue killing germs even after washing. Please DO NOT  use if you have an allergy to CHG or antibacterial soaps.  If your skin becomes reddened/irritated stop using the CHG and inform your nurse when you arrive at Short Stay. Do not shave (including legs and underarms) for at least 48 hours prior to the first CHG shower.  You may shave your face/neck.  Please follow these instructions carefully:  1.  Shower with CHG Soap the night before surgery and the  morning of surgery.  2.  If you choose to wash your hair, wash your hair first as usual with your normal  shampoo.  3.  After you shampoo, rinse your hair and body thoroughly to remove the shampoo.                             4.  Use CHG as you would any other liquid soap.  You can apply chg directly to the skin  and wash.  Gently with a scrungie or clean washcloth.  5.  Apply the CHG Soap to your body ONLY FROM THE NECK DOWN.   Do   not use on face/ open                           Wound or open sores. Avoid contact with eyes, ears mouth and   genitals (private parts).                       Wash face,  Genitals (private parts) with your normal soap.             6.  Wash thoroughly, paying special attention to the area where your    surgery  will be performed.  7.  Thoroughly rinse your body with warm water from the neck down.  8.  DO NOT shower/wash with your normal soap after using and rinsing off the CHG Soap.                9.  Pat yourself dry with a clean towel.            10.  Wear clean pajamas.            11.  Place clean sheets on your bed the night of your first shower and do not  sleep with pets. Day of Surgery : Do not apply any lotions/deodorants the morning of surgery.  Please wear clean clothes to the hospital/surgery center.  FAILURE TO FOLLOW THESE INSTRUCTIONS MAY RESULT IN THE CANCELLATION OF YOUR SURGERY  PATIENT SIGNATURE_________________________________  NURSE  SIGNATURE__________________________________  ________________________________________________________________________   [name]  An incentive spirometer is a tool that can help keep your lungs clear and active. This tool measures how well you are filling your lungs with each breath. Taking long deep breaths may help reverse or decrease the chance of developing breathing (pulmonary) problems (especially infection) following:  A long period of time when you are unable to move or be active. BEFORE THE PROCEDURE   If the spirometer includes an indicator to show your best effort, your nurse or respiratory therapist will set it to a desired goal.  If possible, sit up straight or lean slightly forward. Try not to slouch.  Hold the incentive spirometer in an upright position. INSTRUCTIONS FOR USE  1. Sit on the edge of your bed if possible, or sit up as far as you can in bed or on a chair. 2. Hold the incentive spirometer in an upright position. 3. Breathe out normally. 4. Place the mouthpiece in your mouth and seal your lips tightly around it. 5. Breathe in slowly and as deeply as possible, raising the piston or the ball toward the top of the column. 6. Hold your breath for 3-5 seconds or for as long as possible. Allow the piston or ball to fall to the bottom of the column. 7. Remove the mouthpiece from your mouth and breathe out normally. 8. Rest for a few seconds and repeat Steps 1 through 7 at least 10 times every 1-2 hours when you are awake. Take your time and take a few normal breaths between deep breaths. 9. The spirometer may include an indicator to show your best effort. Use the indicator as a goal to work toward during each repetition. 10. After each set of 10 deep breaths, practice coughing to be sure your lungs are clear. If you have  an incision (the cut made at the time of surgery), support your incision when coughing by placing a pillow or rolled up towels firmly  against it. Once you are able to get out of bed, walk around indoors and cough well. You may stop using the incentive spirometer when instructed by your caregiver.  RISKS AND COMPLICATIONS  Take your time so you do not get dizzy or light-headed.  If you are in pain, you may need to take or ask for pain medication before doing incentive spirometry. It is harder to take a deep breath if you are having pain. AFTER USE  Rest and breathe slowly and easily.  It can be helpful to keep track of a log of your progress. Your caregiver can provide you with a simple table to help with this. If you are using the spirometer at home, follow these instructions: [place] IF:   You are having difficultly using the spirometer.  You have trouble using the spirometer as often as instructed.  Your pain medication is not giving enough relief while using the spirometer.  You develop fever of 100.5 F (38.1 C) or higher. SEEK IMMEDIATE MEDICAL CARE IF:   You cough up bloody sputum that had not been present before.  You develop fever of 102 F (38.9 C) or greater.  You develop worsening pain at or near the incision site. MAKE SURE YOU:   Understand these instructions.  Will watch your condition.  Will get help right away if you are not doing well or get worse. Document Released: 04/04/2007 Document Revised: 02/14/2012 Document Reviewed: 06/05/2007 Exit[employer] Patient Information 2014 [employer], [place].   ________________________________________________________________________  WHAT IS A BLOOD TRANSFUSION? Blood Transfusion Information  A transfusion is the replacement of blood or some of its parts. Blood is made up of multiple cells which provide different functions.  Red blood cells carry oxygen and are used for blood loss replacement.  White blood cells fight against infection.  Platelets control bleeding.  Plasma helps clot blood.  Other blood products are available for  specialized needs, such as hemophilia or other clotting disorders. BEFORE THE TRANSFUSION  Who gives blood for transfusions?   Healthy volunteers who are fully evaluated to make sure their blood is safe. This is blood bank blood. Transfusion therapy is the safest it has ever been in the practice of medicine. Before blood is taken from a donor, a complete history is taken to make sure that person has no history of diseases nor engages in risky social behavior (examples are intravenous drug use or sexual activity with multiple partners). The donor's travel history is screened to minimize risk of transmitting infections, such as malaria. The donated blood is tested for signs of infectious diseases, such as HIV and hepatitis. The blood is then tested to be sure it is compatible with you in order to minimize the chance of a transfusion reaction. If you or a relative donates blood, this is often done in anticipation of surgery and is not appropriate for emergency situations. It takes many days to process the donated blood. RISKS AND COMPLICATIONS Although transfusion therapy is very safe and saves many lives, the main dangers of transfusion include:   Getting an infectious disease.  Developing a transfusion reaction. This is an allergic reaction to something in the blood you were given. Every precaution is taken to prevent this. The decision to have a blood transfusion has been considered carefully by your caregiver before blood is given. Blood is not  given unless the benefits outweigh the risks. AFTER THE TRANSFUSION  Right after receiving a blood transfusion, you will usually feel much better and more energetic. This is especially true if your red blood cells have gotten low (anemic). The transfusion raises the level of the red blood cells which carry oxygen, and this usually causes an energy increase.  The nurse administering the transfusion will monitor you carefully for complications. HOME CARE  INSTRUCTIONS  No special instructions are needed after a transfusion. You may find your energy is better. Speak with your caregiver about any limitations on activity for underlying diseases you may have. SEEK MEDICAL CARE IF:   Your condition is not improving after your transfusion.  You develop redness or irritation at the intravenous (IV) site. SEEK IMMEDIATE MEDICAL CARE IF:  Any of the following symptoms occur over the next 12 hours:  Shaking chills.  You have a temperature by mouth above 102 F (38.9 C), not controlled by medicine.  Chest, back, or muscle pain.  People around you feel you are not acting correctly or are confused.  Shortness of breath or difficulty breathing.  Dizziness and fainting.  You get a rash or develop hives.  You have a decrease in urine output.  Your urine turns a dark color or changes to pink, red, or brown. Any of the following symptoms occur over the next 10 days:  You have a temperature by mouth above 102 F (38.9 C), not controlled by medicine.  Shortness of breath.  Weakness after normal activity.  The white part of the eye turns yellow (jaundice).  You have a decrease in the amount of urine or are urinating less often.  Your urine turns a dark color or changes to pink, red, or brown. Document Released: 11/19/2000 Document Revised: 02/14/2012 Document Reviewed: 07/08/2008 [facility] Patient Information 2014 [place], [place].  _______________________________________________________________________

## 2018-12-12 ENCOUNTER — Encounter (INDEPENDENT_AMBULATORY_CARE_PROVIDER_SITE_OTHER): Payer: Self-pay

## 2018-12-12 ENCOUNTER — Encounter (HOSPITAL_COMMUNITY): Payer: Self-pay

## 2018-12-12 ENCOUNTER — Other Ambulatory Visit: Payer: Self-pay

## 2018-12-12 ENCOUNTER — Encounter (HOSPITAL_COMMUNITY)
Admission: RE | Admit: 2018-12-12 | Discharge: 2018-12-12 | Disposition: A | Discharge status: Home / Self Care | Payer: Medicare Other | Source: Ambulatory Visit | Attending: Gynecologic Oncology | Admitting: Gynecologic Oncology

## 2018-12-12 DIAGNOSIS — Z01818 Encounter for other preprocedural examination: Secondary | ICD-10-CM | POA: Diagnosis not present

## 2018-12-12 DIAGNOSIS — C541 Malignant neoplasm of endometrium: Secondary | ICD-10-CM | POA: Insufficient documentation

## 2018-12-12 HISTORY — DX: Anemia, unspecified: D64.9

## 2018-12-12 HISTORY — DX: Mixed hyperlipidemia: E78.2

## 2018-12-12 HISTORY — DX: Malignant neoplasm of endometrium: C54.1

## 2018-12-12 HISTORY — DX: Heartburn: R12

## 2018-12-12 HISTORY — DX: Morbid (severe) obesity due to excess calories: E66.01

## 2018-12-12 HISTORY — DX: Type 2 diabetes mellitus with diabetic polyneuropathy: E11.42

## 2018-12-12 HISTORY — DX: Benign neoplasm of connective and other soft tissue, unspecified: D21.9

## 2018-12-12 LAB — URINALYSIS, ROUTINE W REFLEX MICROSCOPIC
Bilirubin Urine: NEGATIVE
Glucose, UA: NEGATIVE mg/dL
Ketones, ur: 5 mg/dL — AB
Nitrite: NEGATIVE
PROTEIN: 100 mg/dL — AB
RBC / HPF: >50 RBC/hpf — ABNORMAL HIGH (ref 0–5)
Specific Gravity, Urine: 1.023 (ref 1.005–1.030)
pH: 8 (ref 5.0–8.0)

## 2018-12-12 LAB — CBC
HCT: 36.8 % (ref 36.0–46.0)
Hemoglobin: 10.7 g/dL — ABNORMAL LOW (ref 12.0–15.0)
MCH: 25.7 pg — ABNORMAL LOW (ref 26.0–34.0)
MCHC: 29.1 g/dL — ABNORMAL LOW (ref 30.0–36.0)
MCV: 88.2 fL (ref 80.0–100.0)
Platelets: 518 10*3/uL — ABNORMAL HIGH (ref 150–400)
RBC: 4.17 MIL/uL (ref 3.87–5.11)
RDW: 13.8 % (ref 11.5–15.5)
WBC: 7.3 10*3/uL (ref 4.0–10.5)
nRBC: 0 % (ref 0.0–0.2)

## 2018-12-12 LAB — COMPREHENSIVE METABOLIC PANEL
ALT: 12 U/L (ref 0–44)
AST: 13 U/L — ABNORMAL LOW (ref 15–41)
Albumin: 4.2 g/dL (ref 3.5–5.0)
Alkaline Phosphatase: 64 U/L (ref 38–126)
Anion gap: 10 (ref 5–15)
BUN: 10 mg/dL (ref 6–20)
CO2: 30 mmol/L (ref 22–32)
Calcium: 9.6 mg/dL (ref 8.9–10.3)
Chloride: 100 mmol/L (ref 98–111)
Creatinine, Ser: 0.82 mg/dL (ref 0.44–1.00)
GFR calc Af Amer: >60 mL/min (ref >60)
GFR calc non Af Amer: >60 mL/min (ref >60)
Glucose, Bld: 129 mg/dL — ABNORMAL HIGH (ref 70–99)
POTASSIUM: 3.5 mmol/L (ref 3.5–5.1)
Sodium: 140 mmol/L (ref 135–145)
Total Bilirubin: 0.6 mg/dL (ref 0.3–1.2)
Total Protein: 7.7 g/dL (ref 6.5–8.1)

## 2018-12-12 LAB — HEMOGLOBIN A1C
Hgb A1c MFr Bld: 7 % — ABNORMAL HIGH (ref 4.8–5.6)
Mean Plasma Glucose: 154.2 mg/dL

## 2018-12-12 LAB — GLUCOSE, CAPILLARY: Glucose-Capillary: 196 mg/dL — ABNORMAL HIGH (ref 70–99)

## 2018-12-12 LAB — ABO/RH: ABO/RH(D): O POS

## 2018-12-12 NOTE — Progress Notes (Signed)
Anesthesiology note:  [name] is a 58 year old female with chronic MS and morbid obesity who was recently diagnosed with endometrial cancer and is scheduled to undergo robotic assisted total hysterectomy and bilateral salpingo-oophorectomy and sentinel node biopsy.  The patient is wheelchair-bound and is unable to bear her weight.  She has a suprapubic suprapubic bladder catheter which is been present since 2009.  She denies shortness of breath, use of home oxygen, cardiac disease or stroke.  On examination, she is a morbidly obese female sitting in a wheelchair in no acute distress.  She has adequate neck mobility and opens mouth her adequately.  Heart and lung exam appeared normal.  Lab studies are notable for creatinine 0.82 glucose 196 and mild anemia with H&H 10.7/36.8.  Impression: Morbidly obese female with endometrial cancer and chronic MS who is wheelchair-bound with a chronic urinary suprapubic catheter.  Now scheduled to undergo robotic assisted total hysterectomy and bilateral salpingo-oophorectomy.  Plan: 1.  General endotracheal anesthesia may require glide scope intubation. 2.  Consider multimodal analgesia intraoperatively.  The anesthetic plan was discussed with the patient and all questions were answered.  Roberts Gaudy, MD

## 2018-12-13 ENCOUNTER — Ambulatory Visit (INDEPENDENT_AMBULATORY_CARE_PROVIDER_SITE_OTHER): Payer: Medicare Other | Admitting: Neurology

## 2018-12-13 ENCOUNTER — Encounter: Payer: Self-pay | Admitting: Neurology

## 2018-12-13 VITALS — BP 134/76 | HR 86 | Ht 67.0 in

## 2018-12-13 DIAGNOSIS — G35 Multiple sclerosis: Secondary | ICD-10-CM

## 2018-12-13 DIAGNOSIS — G8220 Paraplegia, unspecified: Secondary | ICD-10-CM

## 2018-12-13 DIAGNOSIS — R208 Other disturbances of skin sensation: Secondary | ICD-10-CM | POA: Diagnosis not present

## 2018-12-13 DIAGNOSIS — E0842 Diabetes mellitus due to underlying condition with diabetic polyneuropathy: Secondary | ICD-10-CM

## 2018-12-13 MED ORDER — INTERFERON BETA-1B 0.3 MG ~~LOC~~ KIT: 0.3000 mg | PACK | SUBCUTANEOUS | 3 refills | Status: DC

## 2018-12-13 NOTE — Progress Notes (Signed)
[facility]  PATIENT: [name] DOB: [birth year]  REFERRING DOCTOR OR PCP:  Delia Chimes SOURCE: patient, records from Dr. Nolon Rod  _________________________________   HISTORICAL  CHIEF COMPLAINT:  Chief Complaint  Patient presents with  . Follow-up    RM 13, alone. Last seen 11/23/2017. No recent falls.   . Multiple Sclerosis    On Betaseron. Tolerating well.   Marland Kitchen Spasms    Taking baclofen  . Gait Problem    In electric wheelchair in office today  . Cancer    Recently dx with uterine cx prior to Christmas 2019. She has surgery scheduled for 12/19/2018 at [facility].     HISTORY OF PRESENT ILLNESS:  [name] is a 58 year old woman who was diagnosed with MS in 2006.  Update 12/13/2017: She feels her MS is stable and she denies exacerbation.     She is now using the wheelchair exclusively.    She uses a bedpan.     She has help (daughter).    She has a lot of weakness and spasticity in her legs.    She is on baclofen with some benefit.   She has some tingling and numbness on the right side and the hand often feels funny.    She has a suprapubic catheter for urinary retention.   No infections recently.    She note reduced vision OS but colors are about the same left and right.   She is going to see ophthalmology after her surgery.      Mood is doing ok.    She is sleeping well most nights.   Cognition is doing well.    Fatigue is mild.       She was just diagnosed with uterine cancer (discovered during pap smear) and has surgery scheduled.    She was diagnosed in 2006 (Dr. Luberta Robertson in [facility]) and was on Copaxone (several exacerbations) and then Betaseron.      Uptake 11/23/2017:    She feels her MS has been stable. She has been on Betaseron for many years. She had exacerbations on Copaxone. She is wheelchair-bound and has been for several years. She can transfer with assistance independently but has noted more difficulty doing that over the past few  months. She does have spasticity in both legs, right equals left. Higher dose of baclofen did not help the spasticity any.  She notes visual blurring out of the left eye. Right eye visual acuity is fine. She has a suprapubic catheter for bladder dysfunction. She has had that since her large exacerbation in 2008. She notes some fatigue but has not been getting a full night's rest every night. She probably sleeps 4-5 hours most nights. She denies any depression or cognitive problems.   From 05/18/2017: MS:   She is on Betaseron. She had exacerbations while on Copaxone. She tolerates the Betaseron well and is not interested in switching to a different medication was more than 10 years ago. She is claustrophobic and does not wish to have another MRI if possible.  Gait/strength/sensation: She has been wheelchair bound for several years and was using a walker since 2007 shortly after her diagnosis. She helps him with transfers cannot transfer independently. Her arms remained strong but she has weakness in both legs with spasticity. Muscle spasticity is worse at night but can occur during the day as well.   She takes baclofen 20 mg 3 times a day. She denies any numbness or tingling in the arms.  She has a mild burning sensation in her feet. Lamotrigine did not help the dysesthetic sensation.   Bladder/bowel: She has a suprapubic catheter since 2009 due to urinary retention. She denies any bowel dysfunction.  She gets frequent UTI's     She recently started taking cranberries and drinks lots of fluids to try to prevent them.    Vision:  She notes some difficulty with her left eye and has been told by her ophthalmologist that it is due to a combination of MS and diabetes.    Fatigue/sleep: She denies any significant problems with fatigue. She sleeps well at night. She gets some spasms when she lays down but once she falls asleep she stays asleep.   Mood/cognition:  She feels her mood is good. She denies any  depression or anxiety. She denies any difficulty with her memory or other cognitive skills.   MS History:   In 2006, she presented with tingling in her hands and right greater than left leg weakness.    She saw Dr. Luberta Robertson in [facility]. She was placed on Copaxone. Unfortunately,  she had a major exacerbation later that year and lost most of the use of her legs.  She was then placed on Betaseron and remains on Betaseron. She does not think she has had any exacerbations while she is on it. She tolerates it well and does not have any skin reactions.    Since 2007, she has been predominantly wheelchair bound strength has progressively worsened and she went from using a walker for short distances to being completely wheelchair bound a few years ago.   Her last MRI was greater than 10 years ago.  REVIEW OF SYSTEMS: Constitutional: No fevers, chills, sweats, or change in appetite Eyes: Reduced left vision.  No eye pain Ear, nose and throat: No hearing loss, ear pain, nasal congestion, sore throat Cardiovascular: No chest pain, palpitations Respiratory: No shortness of breath at rest or with exertion.   No wheezes GastrointestinaI: No nausea, vomiting, diarrhea, abdominal pain, fecal incontinence Genitourinary: She has a suprapubic catheter.. Musculoskeletal: No neck pain, back pain Integumentary: No rash, pruritus, skin lesions Neurological: as above Psychiatric: Denies depression at this time.  No anxiety Endocrine: has NIDDM Hematologic/Lymphatic: No anemia, purpura, petechiae. Allergic/Immunologic: No itchy/runny eyes, nasal congestion, recent allergic reactions, rashes  ALLERGIES: No Known Allergies  HOME MEDICATIONS:  Current Outpatient Medications:  .  Adhesive Tape (PAPER TAPE 1"X10YD) TAPE, Apply to dressings as needed, Disp: 4 each, Rfl: 6 .  atorvastatin (LIPITOR) 10 MG tablet, Take 10 mg by mouth every evening. , Disp: , Rfl:  .  baclofen (LIORESAL) 20 MG tablet, TAKE 1 TABLET BY  MOUTH FOUR TIMES A DAY**OFFICE VISIT NEEDED** (Patient taking differently: Take 20 mg by mouth 4 (four) times daily. ), Disp: 120 tablet, Rfl: 0 .  Catheters (DOVER UNIVERSAL FOLEY TRAY) KIT, Use foley tray kit to change catheter every 3 weeks or as needed, Disp: 15 each, Rfl: 1 .  Catheters (FOLEY CATHETER 2-WAY) MISC, Use to aspirate urine suprapubic, Disp: 15 each, Rfl: 1 .  Cholecalciferol (D3-1000) 1000 units capsule, Take 1,000 Units by mouth 2 (two) times daily. MORNING & AFTERNOON., Disp: , Rfl:  .  Control Gel Formula Dressing (DUODERM CGF DRESSING) MISC, Apply 1 each topically daily as needed., Disp: 5 each, Rfl: 11 .  cyanocobalamin 500 MCG tablet, Take 500 mcg by mouth daily., Disp: , Rfl:  .  Disposable Gloves MISC, Use disposable gloves to change catheter as  needed, Disp: 200 each, Rfl: 11 .  furosemide (LASIX) 20 MG tablet, TAKE 1 TABLET BY MOUTH ONCE DAILY, Disp: 90 tablet, Rfl: 0 .  glipiZIDE (GLUCOTROL) 5 MG tablet, Take 1 tablet (5 mg total) by mouth daily before lunch. (Patient taking differently: Take 5 mg by mouth daily. ), Disp: 90 tablet, Rfl: 3 .  hydrochlorothiazide (HYDRODIURIL) 25 MG tablet, Take 25 mg by mouth daily., Disp: , Rfl:  .  Incontinence Supply Disposable (TENA FLEX 16 PLUS) MISC, Use daily as needed, Disp: 30 each, Rfl: 11 .  Incontinence Supply Disposable (TENA PROTECT UNDERWEAR PLS/XLG) MISC, Use for incontinence., Disp: 30 each, Rfl: 11 .  Interferon Beta-1b (BETASERON) 0.3 MG KIT injection, Inject 0.3 mg into the skin every other day., Disp: 36 each, Rfl: 3 .  metFORMIN (GLUCOPHAGE) 500 MG tablet, Take 500 mg by mouth 2 (two) times daily., Disp: , Rfl: 3 .  MOVANTIK 25 MG TABS tablet, Take 25 mg by mouth every other day. IN THE MORNING., Disp: , Rfl: 4 .  NON FORMULARY, Power wheelchair repairs  Diagnosis code z99.3, Disp: 1 each, Rfl: 0 .  NON FORMULARY, Dispense catheter urine drainage bags, Disp: 90 Bag, Rfl: 3 .  nystatin ointment (MYCOSTATIN), Apply  to affected area twice daily as needed for irritation, Disp: 30 g, Rfl: 6 .  Ostomy Supplies (NEW IMAGE SKIN/FLANGE/TAPE) MISC, 1 application by Does not apply route daily as needed., Disp: 30 each, Rfl: 11 .  oxyCODONE (OXYCONTIN) 20 mg 12 hr tablet, Take 1 tablet (20 mg total) by mouth 3 (three) times daily as needed (pain)., Disp: 90 tablet, Rfl: 0 .  oxyCODONE-acetaminophen (PERCOCET) 10-325 MG tablet, Take 1 tablet by mouth every 8 (eight) hours as needed for pain. Needs office visit., Disp: 90 tablet, Rfl: 0 .  potassium chloride (K-DUR,KLOR-CON) 10 MEQ tablet, TAKE 3 TABLETS BY MOUTH TWICE DAILY**OFFICE VISIT NEEDED FOR REFILLS** (Patient taking differently: Take 30 mEq by mouth 2 (two) times daily. ), Disp: 180 tablet, Rfl: 0 .  Syringe, Disposable, (B-D SYRINGE LUER-LOK 30CC) 30 ML MISC, Use to change catheter, Disp: 15 each, Rfl: 0 .  Wound Dressings (GRX HYDROGEL GAUZE 4X4) PADS, Use gauze to clean wound daily as needed, Disp: 200 each, Rfl: 11  PAST MEDICAL HISTORY: Past Medical History:  Diagnosis Date  . Anemia    history of  . Arthritis   . Decubitus ulcers    currently treated at wound center  . Diabetes mellitus without complication ([place])   . Diabetic polyneuropathy ([place])   . Endometrial cancer ([facility])    Grade 2  . Fibroids   . Heartburn    Occ  . Hypertension   . Mixed hyperlipidemia   . Morbid obesity ([place])   . MS (multiple sclerosis) ([place])     PAST SURGICAL HISTORY: Past Surgical History:  Procedure Laterality Date  . COLONOSCOPY    . ESOPHAGOGASTRODUODENOSCOPY N/A 12/02/2017   Procedure: ESOPHAGOGASTRODUODENOSCOPY (EGD);  Surgeon: Ronnette Juniper, MD;  Location: [facility];  Service: Gastroenterology;  Laterality: N/A;  . SUPRAPUBIC CATHETER INSERTION  2009  . TONSILLECTOMY    . TUBAL LIGATION      FAMILY HISTORY: Family History  Problem Relation Age of Onset  . Cancer Mother   . Hypertension Mother   . Heart disease Father   . Diabetes Father   .  Hypertension Sister   . Hypertension Brother     SOCIAL HISTORY:  Social History   Socioeconomic History  . Marital status:  Single    Spouse name: Not on file  . Number of children: Not on file  . Years of education: Not on file  . Highest education level: Not on file  Occupational History  . Not on file  Social Needs  . Financial resource strain: Not on file  . Food insecurity:    Worry: Not on file    Inability: Not on file  . Transportation needs:    Medical: Not on file    Non-medical: Not on file  Tobacco Use  . Smoking status: Never Smoker  . Smokeless tobacco: Never Used  Substance and Sexual Activity  . Alcohol use: No  . Drug use: No  . Sexual activity: Not on file  Lifestyle  . Physical activity:    Days per week: Not on file    Minutes per session: Not on file  . Stress: Not on file  Relationships  . Social connections:    Talks on phone: Not on file    Gets together: Not on file    Attends religious service: Not on file    Active member of club or organization: Not on file    Attends meetings of clubs or organizations: Not on file    Relationship status: Not on file  . Intimate partner violence:    Fear of current or ex partner: Not on file    Emotionally abused: Not on file    Physically abused: Not on file    Forced sexual activity: Not on file  Other Topics Concern  . Not on file  Social History Narrative  . Not on file     PHYSICAL EXAM  Vitals:   12/13/18 1418  BP: 134/76  Pulse: 86  SpO2: 97%  Height: '5\' 7"'$  (1.702 m)    Body mass index is 31.32 kg/m.   General: The patient is well-developed and well-nourished and in no acute distress in a wheelchair  Neck:    The neck is nontender with a good range of motion.   Skin/Ext.: Arms/legs without rash.   She has mild pedal edema.  Neurologic Exam  Mental status: The patient is alert and oriented x 3 at the time of the examination. The patient has apparent normal recent and  remote memory, with an apparently normal attention span and concentration ability.   Speech is normal.  Cranial nerves: Extraocular movements are full.  Color vision was symmetric but acuity was reduced in the left eye.  Facial strength was normal.  Facial sensation was normal.  Trapezius strength was normal.  Hearing was normal and symmetric.  Motor:  Muscle bulk is normal.   Tone is increased in both legs, slightly more on the right than the left. She has normal strength in both arms. However strength is 0/5 in the right leg and 1/5 in the left leg.  Sensory: She has normal sensation to touch and vibration in the arms or proximally in the legs.  There is reduced vibration sensation at the ankles.  Coordination: Cerebellar testing reveals good finger-nose-finger .  RAM in hands is normal.   She is unable to do heel-to-shin  Gait and station: She is wheelchair bound and cannot stand   Reflexes: Deep tendon reflexes are normal in the arms. She has increased reflexes at the knees with spread. She has reduced reflexes at the ankles.  No ankle clonus.Marland Kitchen     DIAGNOSTIC DATA (LABS, IMAGING, TESTING) - I reviewed patient records, labs, notes, testing and imaging myself  where available.      ASSESSMENT AND PLAN  Multiple sclerosis ([place])  Diabetic polyneuropathy associated with diabetes mellitus due to underlying condition ([place])  Diplegia of both lower extremities ([place])  Dysesthesia    1.  Continue Betaseron.  She has not had any recent exacerbations.  She prefers not to have an MRI at this time to determine if she is having any subclinical activity.  She just blood work regularly with her. 2.  Continue baclofen.  If spasticity worsens we can add tizanidine.   3.    Continue her other medications.  4.    She will follow-up in 12 months or sooner if there are new or worsening neurologic symptoms   Richard A. Felecia Shelling, MD, PhD 04/12/997, 3:38 PM Certified in Neurology, Clinical  Neurophysiology, Sleep Medicine, Pain Medicine and Neuroimaging  [facility] [address] [place], [place] [address] [phone]

## 2018-12-13 NOTE — Pre-Procedure Instructions (Signed)
CBC, CMP, UA, Hgb A1C results 12/12/2018 sent to Dr. Denman George and Jerilynn Mages. Cross NP via epic.

## 2018-12-15 ENCOUNTER — Telehealth: Payer: Self-pay

## 2018-12-15 ENCOUNTER — Telehealth: Payer: Self-pay | Admitting: *Deleted

## 2018-12-15 DIAGNOSIS — N390 Urinary tract infection, site not specified: Secondary | ICD-10-CM

## 2018-12-15 DIAGNOSIS — R319 Hematuria, unspecified: Principal | ICD-10-CM

## 2018-12-15 DIAGNOSIS — N318 Other neuromuscular dysfunction of bladder: Secondary | ICD-10-CM | POA: Diagnosis not present

## 2018-12-15 LAB — URINE CULTURE
Culture: >=100000 — AB
Special Requests: NORMAL

## 2018-12-15 MED ORDER — SULFAMETHOXAZOLE-TRIMETHOPRIM 800-160 MG PO TABS: 1.0000 | ORAL_TABLET | Freq: Two times a day (BID) | ORAL | 0 refills | Status: DC

## 2018-12-15 NOTE — Pre-Procedure Instructions (Signed)
Urine culture results sent to Dr. Denman George and Jerilynn Mages. Cross, NP via epic.

## 2018-12-15 NOTE — Telephone Encounter (Signed)
Spoke with [name] and told her that her urine culture from her pre op labs shows that she has a UTI. Bactrim DS tabs will be sent in to her pharmacy. She is to take 1 tablet bid x 7 days per Joylene John, NP. Pt verbalized understanding.

## 2018-12-15 NOTE — Telephone Encounter (Signed)
Returned Konrad Felix APP phone call regarding the patient's UA/C&S results. Notified the APP via voicemail that Melissa APP and Dr. Denman George are aware of the results.

## 2018-12-15 NOTE — Progress Notes (Signed)
Anesthesia Consult  Urinalysis and urine culture results from 12/12/18 called into Surgeon, Dr. Denman George.  Report she will be started on an antibiotic.    Urinalysis    Component Value Date/Time   COLORURINE AMBER (A) 12/12/2018 1536   APPEARANCEUR CLOUDY (A) 12/12/2018 1536   LABSPEC 1.023 12/12/2018 1536   PHURINE 8.0 12/12/2018 1536   GLUCOSEU NEGATIVE 12/12/2018 1536   HGBUR SMALL (A) 12/12/2018 1536   BILIRUBINUR NEGATIVE 12/12/2018 1536   BILIRUBINUR negative 03/04/2017 1837   KETONESUR 5 (A) 12/12/2018 1536   PROTEINUR 100 (A) 12/12/2018 1536   UROBILINOGEN 0.2 03/04/2017 1837   NITRITE NEGATIVE 12/12/2018 1536   LEUKOCYTESUR SMALL (A) 12/12/2018 1536   Results for orders placed or performed during the hospital encounter of 12/12/18  Urine culture     Status: Abnormal   Collection Time: 12/12/18  3:37 PM  Result Value Ref Range Status   Specimen Description   Final    URINE, SUPRAPUBIC Performed at [facility] [address] [place], [place] [address]    Special Requests   Final    Normal Performed at [facility], [place] [address], [place], [place] [address]    Culture (A)  Final    >=100,000 COLONIES/mL PROTEUS MIRABILIS >=100,000 COLONIES/mL ESCHERICHIA COLI    Report Status 12/15/2018 FINAL  Final   Organism ID, Bacteria PROTEUS MIRABILIS (A)  Final   Organism ID, Bacteria ESCHERICHIA COLI (A)  Final      Susceptibility   Escherichia coli - MIC*    AMPICILLIN >=32 RESISTANT Resistant     CEFAZOLIN <=4 SENSITIVE Sensitive     CEFTRIAXONE <=1 SENSITIVE Sensitive     CIPROFLOXACIN <=0.25 SENSITIVE Sensitive     GENTAMICIN <=1 SENSITIVE Sensitive     IMIPENEM <=0.25 SENSITIVE Sensitive     NITROFURANTOIN 32 SENSITIVE Sensitive     TRIMETH/SULFA <=20 SENSITIVE Sensitive     AMPICILLIN/SULBACTAM 16 INTERMEDIATE Intermediate     PIP/TAZO <=4 SENSITIVE Sensitive     Extended ESBL NEGATIVE Sensitive     * >=100,000 COLONIES/mL  ESCHERICHIA COLI   Proteus mirabilis - MIC*    AMPICILLIN <=2 SENSITIVE Sensitive     CEFAZOLIN <=4 SENSITIVE Sensitive     CEFTRIAXONE <=1 SENSITIVE Sensitive     CIPROFLOXACIN <=0.25 SENSITIVE Sensitive     GENTAMICIN <=1 SENSITIVE Sensitive     IMIPENEM 2 SENSITIVE Sensitive     NITROFURANTOIN 128 RESISTANT Resistant     TRIMETH/SULFA <=20 SENSITIVE Sensitive     AMPICILLIN/SULBACTAM <=2 SENSITIVE Sensitive     PIP/TAZO <=4 SENSITIVE Sensitive     * >=100,000 COLONIES/mL PROTEUS MIRABILIS    [facility] Pre-Surgical Testing [phone] 12/15/18 11:36 AM  .

## 2018-12-19 ENCOUNTER — Ambulatory Visit (HOSPITAL_COMMUNITY): Payer: Medicare Other | Admitting: Anesthesiology

## 2018-12-19 ENCOUNTER — Encounter (HOSPITAL_COMMUNITY)
Admission: RE | Disposition: A | Discharge status: Home / Self Care | Payer: Self-pay | Source: Home / Self Care | Attending: Gynecologic Oncology

## 2018-12-19 ENCOUNTER — Encounter (HOSPITAL_COMMUNITY): Payer: Self-pay | Admitting: *Deleted

## 2018-12-19 ENCOUNTER — Ambulatory Visit (HOSPITAL_COMMUNITY)
Admission: RE | Admit: 2018-12-19 | Discharge: 2018-12-20 | Disposition: A | Discharge status: Home / Self Care | Payer: Medicare Other | Attending: Gynecologic Oncology | Admitting: Gynecologic Oncology

## 2018-12-19 ENCOUNTER — Ambulatory Visit (HOSPITAL_COMMUNITY): Payer: Medicare Other | Admitting: Physician Assistant

## 2018-12-19 ENCOUNTER — Other Ambulatory Visit: Payer: Self-pay

## 2018-12-19 DIAGNOSIS — Z6841 Body Mass Index (BMI) 40.0 and over, adult: Secondary | ICD-10-CM | POA: Diagnosis not present

## 2018-12-19 DIAGNOSIS — M1990 Unspecified osteoarthritis, unspecified site: Secondary | ICD-10-CM | POA: Insufficient documentation

## 2018-12-19 DIAGNOSIS — D251 Intramural leiomyoma of uterus: Secondary | ICD-10-CM

## 2018-12-19 DIAGNOSIS — D4989 Neoplasm of unspecified behavior of other specified sites: Secondary | ICD-10-CM | POA: Diagnosis not present

## 2018-12-19 DIAGNOSIS — C775 Secondary and unspecified malignant neoplasm of intrapelvic lymph nodes: Secondary | ICD-10-CM | POA: Diagnosis not present

## 2018-12-19 DIAGNOSIS — Z7984 Long term (current) use of oral hypoglycemic drugs: Secondary | ICD-10-CM | POA: Insufficient documentation

## 2018-12-19 DIAGNOSIS — C542 Malignant neoplasm of myometrium: Secondary | ICD-10-CM | POA: Diagnosis not present

## 2018-12-19 DIAGNOSIS — I10 Essential (primary) hypertension: Secondary | ICD-10-CM | POA: Insufficient documentation

## 2018-12-19 DIAGNOSIS — C541 Malignant neoplasm of endometrium: Secondary | ICD-10-CM | POA: Diagnosis not present

## 2018-12-19 DIAGNOSIS — E119 Type 2 diabetes mellitus without complications: Secondary | ICD-10-CM

## 2018-12-19 DIAGNOSIS — G35 Multiple sclerosis: Secondary | ICD-10-CM | POA: Diagnosis present

## 2018-12-19 DIAGNOSIS — Z794 Long term (current) use of insulin: Secondary | ICD-10-CM

## 2018-12-19 DIAGNOSIS — E1142 Type 2 diabetes mellitus with diabetic polyneuropathy: Secondary | ICD-10-CM | POA: Diagnosis present

## 2018-12-19 DIAGNOSIS — Z79899 Other long term (current) drug therapy: Secondary | ICD-10-CM | POA: Diagnosis not present

## 2018-12-19 DIAGNOSIS — Z993 Dependence on wheelchair: Secondary | ICD-10-CM | POA: Diagnosis not present

## 2018-12-19 DIAGNOSIS — N800 Endometriosis of uterus: Secondary | ICD-10-CM | POA: Diagnosis not present

## 2018-12-19 HISTORY — PX: SENTINEL NODE BIOPSY: SHX6608

## 2018-12-19 HISTORY — PX: ROBOTIC ASSISTED TOTAL HYSTERECTOMY WITH BILATERAL SALPINGO OOPHERECTOMY: SHX6086

## 2018-12-19 LAB — TYPE AND SCREEN
ABO/RH(D): O POS
Antibody Screen: NEGATIVE

## 2018-12-19 LAB — GLUCOSE, CAPILLARY
GLUCOSE-CAPILLARY: 156 mg/dL — AB (ref 70–99)
Glucose-Capillary: 127 mg/dL — ABNORMAL HIGH (ref 70–99)
Glucose-Capillary: 232 mg/dL — ABNORMAL HIGH (ref 70–99)

## 2018-12-19 SURGERY — HYSTERECTOMY, TOTAL, ROBOT-ASSISTED, LAPAROSCOPIC, WITH BILATERAL SALPINGO-OOPHORECTOMY
Anesthesia: General

## 2018-12-19 MED ORDER — ENOXAPARIN SODIUM 40 MG/0.4ML ~~LOC~~ SOLN
40.0000 mg | SUBCUTANEOUS | Status: DC
  Administered 2018-12-20: 40 mg via SUBCUTANEOUS
  Filled 2018-12-19: qty 0.4

## 2018-12-19 MED ORDER — DEXAMETHASONE SODIUM PHOSPHATE 10 MG/ML IJ SOLN
INTRAMUSCULAR | Status: AC
  Filled 2018-12-19: qty 1

## 2018-12-19 MED ORDER — HYDROMORPHONE HCL 1 MG/ML IJ SOLN: 0.2500 mg | INTRAMUSCULAR | Status: DC | PRN

## 2018-12-19 MED ORDER — LACTATED RINGERS IV SOLN
INTRAVENOUS | Status: DC
  Administered 2018-12-19 (×2): via INTRAVENOUS

## 2018-12-19 MED ORDER — PHENYLEPHRINE 40 MCG/ML (10ML) SYRINGE FOR IV PUSH (FOR BLOOD PRESSURE SUPPORT)
PREFILLED_SYRINGE | INTRAVENOUS | Status: AC
  Filled 2018-12-19: qty 10

## 2018-12-19 MED ORDER — LIDOCAINE 2% (20 MG/ML) 5 ML SYRINGE
INTRAMUSCULAR | Status: DC | PRN
  Administered 2018-12-19: 100 mg via INTRAVENOUS

## 2018-12-19 MED ORDER — SUGAMMADEX SODIUM 500 MG/5ML IV SOLN
INTRAVENOUS | Status: AC
  Filled 2018-12-19: qty 5

## 2018-12-19 MED ORDER — FENTANYL CITRATE (PF) 250 MCG/5ML IJ SOLN
INTRAMUSCULAR | Status: AC
  Filled 2018-12-19: qty 5

## 2018-12-19 MED ORDER — LACTATED RINGERS IR SOLN
Status: DC | PRN
  Administered 2018-12-19: 1000 mL

## 2018-12-19 MED ORDER — KETOROLAC TROMETHAMINE 15 MG/ML IJ SOLN
15.0000 mg | Freq: Four times a day (QID) | INTRAMUSCULAR | Status: DC
  Administered 2018-12-19 – 2018-12-20 (×3): 15 mg via INTRAVENOUS
  Filled 2018-12-19 (×3): qty 1

## 2018-12-19 MED ORDER — GABAPENTIN 300 MG PO CAPS
300.0000 mg | ORAL_CAPSULE | Freq: Three times a day (TID) | ORAL | Status: DC
  Administered 2018-12-19 – 2018-12-20 (×2): 300 mg via ORAL
  Filled 2018-12-19 (×2): qty 1

## 2018-12-19 MED ORDER — INSULIN ASPART 100 UNIT/ML ~~LOC~~ SOLN: 0.0000 [IU] | Freq: Every day | SUBCUTANEOUS | Status: DC

## 2018-12-19 MED ORDER — PROPOFOL 10 MG/ML IV BOLUS
INTRAVENOUS | Status: DC | PRN
  Administered 2018-12-19: 160 mg via INTRAVENOUS
  Administered 2018-12-19: 40 mg via INTRAVENOUS

## 2018-12-19 MED ORDER — INSULIN ASPART 100 UNIT/ML ~~LOC~~ SOLN
0.0000 [IU] | Freq: Three times a day (TID) | SUBCUTANEOUS | Status: DC
  Administered 2018-12-20: 4 [IU] via SUBCUTANEOUS

## 2018-12-19 MED ORDER — FENTANYL CITRATE (PF) 100 MCG/2ML IJ SOLN
INTRAMUSCULAR | Status: AC
  Filled 2018-12-19: qty 2

## 2018-12-19 MED ORDER — CEFAZOLIN SODIUM-DEXTROSE 2-4 GM/100ML-% IV SOLN
2.0000 g | INTRAVENOUS | Status: DC
  Filled 2018-12-19: qty 100

## 2018-12-19 MED ORDER — SUGAMMADEX SODIUM 500 MG/5ML IV SOLN
INTRAVENOUS | Status: DC | PRN
  Administered 2018-12-19: 350 mg via INTRAVENOUS

## 2018-12-19 MED ORDER — GABAPENTIN 600 MG PO TABS
300.0000 mg | ORAL_TABLET | Freq: Three times a day (TID) | ORAL | Status: DC
  Filled 2018-12-19: qty 0.5

## 2018-12-19 MED ORDER — OXYCODONE-ACETAMINOPHEN 5-325 MG PO TABS: 2.0000 | ORAL_TABLET | Freq: Four times a day (QID) | ORAL | Status: DC | PRN

## 2018-12-19 MED ORDER — BUPIVACAINE HCL (PF) 0.25 % IJ SOLN
INTRAMUSCULAR | Status: AC
  Filled 2018-12-19: qty 30

## 2018-12-19 MED ORDER — ROCURONIUM BROMIDE 10 MG/ML (PF) SYRINGE
PREFILLED_SYRINGE | INTRAVENOUS | Status: AC
  Filled 2018-12-19: qty 10

## 2018-12-19 MED ORDER — DEXTROSE 5 % IV SOLN
3.0000 g | Freq: Once | INTRAVENOUS | Status: AC
  Administered 2018-12-19: 3 g via INTRAVENOUS
  Filled 2018-12-19: qty 3

## 2018-12-19 MED ORDER — SENNOSIDES-DOCUSATE SODIUM 8.6-50 MG PO TABS
2.0000 | ORAL_TABLET | Freq: Every day | ORAL | Status: DC
  Administered 2018-12-19: 2 via ORAL
  Filled 2018-12-19: qty 2

## 2018-12-19 MED ORDER — PROMETHAZINE HCL 25 MG/ML IJ SOLN: 6.2500 mg | INTRAMUSCULAR | Status: DC | PRN

## 2018-12-19 MED ORDER — FENTANYL CITRATE (PF) 100 MCG/2ML IJ SOLN
INTRAMUSCULAR | Status: DC | PRN
  Administered 2018-12-19 (×7): 50 ug via INTRAVENOUS

## 2018-12-19 MED ORDER — ONDANSETRON HCL 4 MG/2ML IJ SOLN
INTRAMUSCULAR | Status: DC | PRN
  Administered 2018-12-19 (×2): 4 mg via INTRAVENOUS

## 2018-12-19 MED ORDER — STERILE WATER FOR INJECTION IJ SOLN
INTRAMUSCULAR | Status: AC
  Filled 2018-12-19: qty 10

## 2018-12-19 MED ORDER — KETOROLAC TROMETHAMINE 30 MG/ML IJ SOLN: 15.0000 mg | Freq: Once | INTRAMUSCULAR | Status: DC

## 2018-12-19 MED ORDER — LIDOCAINE 2% (20 MG/ML) 5 ML SYRINGE
INTRAMUSCULAR | Status: AC
  Filled 2018-12-19: qty 5

## 2018-12-19 MED ORDER — ONDANSETRON HCL 4 MG/2ML IJ SOLN: 4.0000 mg | Freq: Four times a day (QID) | INTRAMUSCULAR | Status: DC | PRN

## 2018-12-19 MED ORDER — KCL IN DEXTROSE-NACL 20-5-0.45 MEQ/L-%-% IV SOLN
INTRAVENOUS | Status: DC
  Administered 2018-12-19 – 2018-12-20 (×2): via INTRAVENOUS
  Filled 2018-12-19 (×2): qty 1000

## 2018-12-19 MED ORDER — CELECOXIB 200 MG PO CAPS
400.0000 mg | ORAL_CAPSULE | ORAL | Status: AC
  Administered 2018-12-19: 400 mg via ORAL
  Filled 2018-12-19: qty 2

## 2018-12-19 MED ORDER — ONDANSETRON HCL 4 MG/2ML IJ SOLN
INTRAMUSCULAR | Status: AC
  Filled 2018-12-19: qty 2

## 2018-12-19 MED ORDER — SCOPOLAMINE 1 MG/3DAYS TD PT72
1.0000 | MEDICATED_PATCH | TRANSDERMAL | Status: DC
  Administered 2018-12-19: 1.5 mg via TRANSDERMAL
  Filled 2018-12-19: qty 1

## 2018-12-19 MED ORDER — OXYCODONE HCL 5 MG PO TABS: 5.0000 mg | ORAL_TABLET | Freq: Once | ORAL | Status: DC | PRN

## 2018-12-19 MED ORDER — INSULIN GLARGINE 100 UNIT/ML ~~LOC~~ SOLN
26.0000 [IU] | Freq: Every day | SUBCUTANEOUS | Status: DC
  Administered 2018-12-19: 26 [IU] via SUBCUTANEOUS
  Filled 2018-12-19 (×2): qty 0.26

## 2018-12-19 MED ORDER — DEXAMETHASONE SODIUM PHOSPHATE 10 MG/ML IJ SOLN
INTRAMUSCULAR | Status: DC | PRN
  Administered 2018-12-19: 5 mg via INTRAVENOUS

## 2018-12-19 MED ORDER — PROPOFOL 10 MG/ML IV BOLUS
INTRAVENOUS | Status: AC
  Filled 2018-12-19: qty 20

## 2018-12-19 MED ORDER — ENOXAPARIN SODIUM 40 MG/0.4ML ~~LOC~~ SOLN
40.0000 mg | SUBCUTANEOUS | Status: AC
  Administered 2018-12-19: 40 mg via SUBCUTANEOUS
  Filled 2018-12-19: qty 0.4

## 2018-12-19 MED ORDER — MEPERIDINE HCL 50 MG/ML IJ SOLN: 6.2500 mg | INTRAMUSCULAR | Status: DC | PRN

## 2018-12-19 MED ORDER — METOCLOPRAMIDE HCL 5 MG PO TABS
5.0000 mg | ORAL_TABLET | Freq: Three times a day (TID) | ORAL | Status: DC
  Administered 2018-12-19 – 2018-12-20 (×3): 5 mg via ORAL
  Filled 2018-12-19 (×3): qty 1

## 2018-12-19 MED ORDER — ROCURONIUM BROMIDE 50 MG/5ML IV SOSY
PREFILLED_SYRINGE | INTRAVENOUS | Status: DC | PRN
  Administered 2018-12-19 (×2): 20 mg via INTRAVENOUS
  Administered 2018-12-19: 50 mg via INTRAVENOUS

## 2018-12-19 MED ORDER — OXYCODONE HCL 5 MG/5ML PO SOLN
5.0000 mg | Freq: Once | ORAL | Status: DC | PRN
  Filled 2018-12-19: qty 5

## 2018-12-19 MED ORDER — HYDROMORPHONE HCL 1 MG/ML IJ SOLN: 0.5000 mg | INTRAMUSCULAR | Status: DC | PRN

## 2018-12-19 MED ORDER — OXYCODONE HCL ER 20 MG PO T12A
30.0000 mg | EXTENDED_RELEASE_TABLET | Freq: Two times a day (BID) | ORAL | Status: DC
  Administered 2018-12-19 – 2018-12-20 (×2): 30 mg via ORAL
  Filled 2018-12-19 (×2): qty 1

## 2018-12-19 MED ORDER — ONDANSETRON HCL 4 MG PO TABS: 4.0000 mg | ORAL_TABLET | Freq: Four times a day (QID) | ORAL | Status: DC | PRN

## 2018-12-19 MED ORDER — DEXAMETHASONE SODIUM PHOSPHATE 4 MG/ML IJ SOLN: 4.0000 mg | INTRAMUSCULAR | Status: DC

## 2018-12-19 MED ORDER — ACETAMINOPHEN 500 MG PO TABS
1000.0000 mg | ORAL_TABLET | ORAL | Status: AC
  Administered 2018-12-19: 1000 mg via ORAL
  Filled 2018-12-19: qty 2

## 2018-12-19 MED ORDER — BACLOFEN 20 MG PO TABS
20.0000 mg | ORAL_TABLET | Freq: Four times a day (QID) | ORAL | Status: DC
  Administered 2018-12-19 – 2018-12-20 (×3): 20 mg via ORAL
  Filled 2018-12-19 (×3): qty 1

## 2018-12-19 MED ORDER — STERILE WATER FOR IRRIGATION IR SOLN
Status: DC | PRN
  Administered 2018-12-19: 1000 mL

## 2018-12-19 MED ORDER — PHENYLEPHRINE HCL 10 MG/ML IJ SOLN
INTRAMUSCULAR | Status: AC
  Filled 2018-12-19: qty 1

## 2018-12-19 MED ORDER — ATORVASTATIN CALCIUM 10 MG PO TABS
10.0000 mg | ORAL_TABLET | Freq: Every evening | ORAL | Status: DC
  Administered 2018-12-19: 10 mg via ORAL
  Filled 2018-12-19: qty 1

## 2018-12-19 MED ORDER — STERILE WATER FOR INJECTION IJ SOLN
INTRAMUSCULAR | Status: DC | PRN
  Administered 2018-12-19: 10 mL

## 2018-12-19 MED ORDER — PHENYLEPHRINE 40 MCG/ML (10ML) SYRINGE FOR IV PUSH (FOR BLOOD PRESSURE SUPPORT)
PREFILLED_SYRINGE | INTRAVENOUS | Status: DC | PRN
  Administered 2018-12-19 (×3): 80 ug via INTRAVENOUS

## 2018-12-19 SURGICAL SUPPLY — 52 items
APPLICATOR SURGIFLO ENDO (HEMOSTASIS) IMPLANT
BAG LAPAROSCOPIC 12 15 PORT 16 (BASKET) IMPLANT
BAG RETRIEVAL 12/15 (BASKET)
COVER BACK TABLE 60X90IN (DRAPES) ×3 IMPLANT
COVER TIP SHEARS 8 DVNC (MISCELLANEOUS) ×2 IMPLANT
COVER TIP SHEARS 8MM DA VINCI (MISCELLANEOUS) ×1
COVER WAND RF STERILE (DRAPES) ×1 IMPLANT
DERMABOND ADVANCED (GAUZE/BANDAGES/DRESSINGS) ×1
DERMABOND ADVANCED .7 DNX12 (GAUZE/BANDAGES/DRESSINGS) ×2 IMPLANT
DRAPE ARM DVNC X/XI (DISPOSABLE) ×8 IMPLANT
DRAPE COLUMN DVNC XI (DISPOSABLE) ×2 IMPLANT
DRAPE DA VINCI XI ARM (DISPOSABLE) ×4
DRAPE DA VINCI XI COLUMN (DISPOSABLE) ×1
DRAPE INCISE IOBAN 66X45 STRL (DRAPES) ×1 IMPLANT
DRAPE SHEET LG 3/4 BI-LAMINATE (DRAPES) ×3 IMPLANT
DRAPE SURG IRRIG POUCH 19X23 (DRAPES) ×3 IMPLANT
DRESSING ALLEVYN LIFE SACRUM (GAUZE/BANDAGES/DRESSINGS) ×1 IMPLANT
DRSG TEGADERM 4X4.75 (GAUZE/BANDAGES/DRESSINGS) ×1 IMPLANT
DRSG TEGADERM 8X12 (GAUZE/BANDAGES/DRESSINGS) ×1 IMPLANT
ELECT REM PT RETURN 15FT ADLT (MISCELLANEOUS) ×3 IMPLANT
GLOVE BIO SURGEON STRL SZ 6 (GLOVE) ×12 IMPLANT
GLOVE BIO SURGEON STRL SZ 6.5 (GLOVE) ×2 IMPLANT
GOWN STRL REUS W/ TWL LRG LVL3 (GOWN DISPOSABLE) ×4 IMPLANT
GOWN STRL REUS W/TWL LRG LVL3 (GOWN DISPOSABLE) ×2
IRRIG SUCT STRYKERFLOW 2 WTIP (MISCELLANEOUS) ×3
IRRIGATION SUCT STRKRFLW 2 WTP (MISCELLANEOUS) ×2 IMPLANT
KIT PROCEDURE DA VINCI SI (MISCELLANEOUS) ×1
KIT PROCEDURE DVNC SI (MISCELLANEOUS) IMPLANT
MANIPULATOR UTERINE 4.5 ZUMI (MISCELLANEOUS) ×3 IMPLANT
NDL SPNL 18GX3.5 QUINCKE PK (NEEDLE) IMPLANT
NEEDLE SPNL 18GX3.5 QUINCKE PK (NEEDLE) ×3 IMPLANT
OBTURATOR OPTICAL STANDARD 8MM (TROCAR) ×1
OBTURATOR OPTICAL STND 8 DVNC (TROCAR) ×2
OBTURATOR OPTICALSTD 8 DVNC (TROCAR) ×2 IMPLANT
PACK ROBOT GYN CUSTOM WL (TRAY / TRAY PROCEDURE) ×3 IMPLANT
PAD POSITIONING PINK XL (MISCELLANEOUS) ×3 IMPLANT
PORT ACCESS TROCAR AIRSEAL 12 (TROCAR) ×2 IMPLANT
PORT ACCESS TROCAR AIRSEAL 5M (TROCAR) ×1
POUCH SPECIMEN RETRIEVAL 10MM (ENDOMECHANICALS) ×1 IMPLANT
SEAL CANN UNIV 5-8 DVNC XI (MISCELLANEOUS) ×8 IMPLANT
SEAL XI 5MM-8MM UNIVERSAL (MISCELLANEOUS) ×4
SET TRI-LUMEN FLTR TB AIRSEAL (TUBING) ×3 IMPLANT
SURGIFLO W/THROMBIN 8M KIT (HEMOSTASIS) IMPLANT
SUT VIC AB 0 CT1 27 (SUTURE) ×1
SUT VIC AB 0 CT1 27XBRD ANTBC (SUTURE) IMPLANT
SUT VIC AB 2-0 SH 27 (SUTURE) ×2
SUT VIC AB 2-0 SH 27X BRD (SUTURE) IMPLANT
SUT VIC AB 3-0 SH 27 (SUTURE) ×2
SUT VIC AB 3-0 SH 27XBRD (SUTURE) IMPLANT
SYR 10ML LL (SYRINGE) IMPLANT
TOWEL OR NON WOVEN STRL DISP B (DISPOSABLE) ×3 IMPLANT
UNDERPAD 30X30 (UNDERPADS AND DIAPERS) ×3 IMPLANT

## 2018-12-19 NOTE — Transfer of Care (Signed)
Immediate Anesthesia Transfer of Care Note  Patient: [name]  Procedure(s) Performed: XI ROBOTIC ASSISTED TOTAL HYSTERECTOMY (UTERUS GREATER THAN 250gr) WITH BILATERAL SALPINGO OOPHORECTOMY (Bilateral ) SENTINEL NODE BIOPSY (N/A )  Patient Location: PACU  Anesthesia Type:General  Level of Consciousness: awake, alert  and oriented  Airway & Oxygen Therapy: Patient Spontanous Breathing and Patient connected to face mask oxygen  Post-op Assessment: Report given to RN and Post -op Vital signs reviewed and stable  Post vital signs: Reviewed and stable  Last Vitals:  Vitals Value Taken Time  BP 166/91 12/19/2018  4:19 PM  Temp    Pulse 85 12/19/2018  4:20 PM  Resp 17 12/19/2018  4:20 PM  SpO2 100 % 12/19/2018  4:20 PM  Vitals shown include unvalidated device data.  Last Pain:  Vitals:   12/19/18 1037  TempSrc:   PainSc: 3          Complications: No apparent anesthesia complications

## 2018-12-19 NOTE — Op Note (Signed)
OPERATIVE NOTE 12/19/17  Surgeon: Donaciano Eva   Assistants: Dr Lahoma Crocker (an MD assistant was necessary for tissue manipulation, management of robotic instrumentation, retraction and positioning due to the complexity of the case and hospital policies).   Anesthesia: General endotracheal anesthesia  ASA Class: 4   Pre-operative Diagnosis: endometrial cancer grade 2, intramural uterine fibroids, morbid obesity, multiple sclerosis  Post-operative Diagnosis: same,   Operation: Robotic-assisted laparoscopic total hysterectomy >250gm with bilateral salpingoophorectomy, SLN biopsy.  Surgeon: Donaciano Eva  Assistant Surgeon: Lahoma Crocker MD  Anesthesia: GET  Urine Output: 200cc via suprapubic catheter  Operative Findings:  : extreme morbid obesity, left posterior thigh pressure sore, 14cm bulky fibroid uterus with posterior lower uterine segment intramural fibroid, normal tubes and ovaries, retroperitoneal fibrosis.  Morbid obesity and comorbidities (paralysis from multiple sclerosis and suprapubic catheter) required additional OR time and personnel for positioning and for operative time to achieve adequate exposure.  Estimated Blood Loss:  less than 50 mL      Total IV Fluids: 900 ml         Specimens: uterus, cervix, bilateral tubes and ovaries, right external iliac SLN, left obturator SLN         Complications:  None; patient tolerated the procedure well.         Disposition: PACU - hemodynamically stable.  Procedure Details  The patient was seen in the Holding Room. The risks, benefits, complications, treatment options, and expected outcomes were discussed with the patient.  The patient concurred with the proposed plan, giving informed consent.  The site of surgery properly noted/marked. The patient was identified as Charlena Haub and the procedure verified as a Robotic-assisted hysterectomy >250gm with bilateral salpingo oophorectomy with SLN biopsy.  A Time Out was held and the above information confirmed.  After induction of anesthesia, the patient was draped and prepped in a sterile manner. Positioning was challenging and took 45 minutes due to her obesity. This took place with care to pad the arms and hands and ensure her legs were positioned appropriately. The suprapubic catheter was fixed with tegaderm. Pt was placed in supine position after anesthesia and draped and prepped in the usual sterile manner. An ioban was used to tape the pannus to the upper thighs to prevent motion during surgery. The abdominal drape was placed after the CholoraPrep had been allowed to dry for 3 minutes.  Her arms were tucked to her side with all appropriate precautions.  The shoulders were stabilized with padded shoulder blocks applied to the acromium processes.  The patient was placed in the semi-lithotomy position in [place].  The perineum was prepped with Betadine. The patient was then prepped. A sterile speculum was placed in the vagina.  The cervix was grasped with a single-tooth tenaculum. 2mg  total of ICG was injected into the cervical stroma at 2 and 9 o'clock with 1cc injected at a 1cm and 44mm depth (concentration 0.5mg /ml) in all locations. The cervix was dilated with Kennon Rounds dilators.  The ZUMI uterine manipulator with a large colpotomizer ring was placed without difficulty.  A pneum occluder balloon was placed over the manipulator.  OG tube placement was confirmed and to suction.   Next, a 5 mm skin incision was made 1 cm below the subcostal margin in the midclavicular line.  The 5 mm Optiview port and scope was used for direct entry.  Opening pressure was under 10 mm CO2.  The abdomen was insufflated and the findings were noted as above.  At this point and all points during the procedure, the patient's intra-abdominal pressure did not exceed 15 mmHg. Next, a 10 mm skin incision was made 2cm above the umbilicus and a right and left port was placed about 10  cm lateral to the robot port on the right and left side.  A fourth arm was placed in the left lower quadrant 2 cm above and superior and medial to the anterior superior iliac spine.  All ports were placed under direct visualization.  The patient was placed in steep Trendelenburg.  Bowel was folded away into the upper abdomen.  The robot was docked in the normal manner.  The right and left peritoneum were opened parallel to the IP ligament to open the retroperitoneal spaces bilaterally. This took longer than usual (60 minutes) due to extreme retroperitoneal adiposity and retroperitoneal fibrosis making dissection of the tissue planes difficult and exposure limited. The SLN mapping was performed in bilateral pelvic basins. The para rectal and paravesical spaces were opened up entirely with careful dissection below the level of the ureters bilaterally and to the depth of the uterine artery origin in order to skeletonize the uterine "web" and ensure visualization of all parametrial channels. The para-aortic basins were carefully exposed and evaluated for isolated para-aortic SLN's. Lymphatic channels were identified travelling to the following visualized sentinel lymph node's: right external iliac (this node was not dyed but resided at the end of a visible channel) and left obturator SLN. These SLN's were separated from their surrounding lymphatic tissue, removed and sent for permanent pathology.  The hysterectomy was started after the round ligament on the right side was incised and the retroperitoneum was entered and the pararectal space was developed.  The ureter was noted to be on the medial leaf of the broad ligament.  The peritoneum above the ureter was incised and stretched and the infundibulopelvic ligament was skeletonized, cauterized and cut.  The posterior peritoneum was taken down to the level of the KOH ring.  The anterior peritoneum was also taken down.  The bladder flap was created to the level of the  KOH ring.  The uterine artery on the right side was skeletonized, cauterized and cut in the normal manner.  A similar procedure was performed on the left.  The colpotomy was made and the uterus, cervix, bilateral ovaries and tubes were amputated and delivered through the vagina. The delivery of the specimen was challenging and prolonged (15 minutes) due to the bulky size of the uterus.  Pedicles were inspected and excellent hemostasis was achieved.    The colpotomy at the vaginal cuff was closed with Vicryl on a CT1 needle in a running manner.  Irrigation was used and excellent hemostasis was achieved.  At this point in the procedure was completed.  Robotic instruments were removed under direct visulaization.  The robot was undocked. The 10 mm ports were closed with Vicryl on a UR-5 needle and the fascia was closed with 0 Vicryl on a UR-5 needle.  The skin was closed with 4-0 Vicryl in a subcuticular manner.  Dermabond was applied.  Sponge, lap and needle counts correct x 2.  The vagina was swabbed with bleeding noted from the left periurethral tissues (and prolapse of the urethra).   This was made hemostatic with 2-0 and 3-0 vicryl sutures. The urine was not bloody. All instrument and needle counts were correct x  3.   The patient was transferred to the recovery room in a stable condition.  Everitt Amber  Chrys Racer, MD

## 2018-12-19 NOTE — Anesthesia Preprocedure Evaluation (Signed)
Anesthesia Evaluation  Patient identified by MRN, date of birth, ID band Patient awake    Reviewed: Allergy & Precautions, NPO status , Patient's Chart, lab work & pertinent test results  History of Anesthesia Complications Negative for: history of anesthetic complications  Airway Mallampati: II  TM Distance: >3 FB Neck ROM: Full    Dental no notable dental hx. (+) Dental Advisory Given   Pulmonary neg pulmonary ROS,    Pulmonary exam normal breath sounds clear to auscultation       Cardiovascular hypertension, negative cardio ROS Normal cardiovascular exam Rhythm:Regular Rate:Normal     Neuro/Psych MS  Neuromuscular disease negative psych ROS   GI/Hepatic negative GI ROS, Neg liver ROS,   Endo/Other  diabetesMorbid obesity  Renal/GU negative Renal ROS  negative genitourinary   Musculoskeletal negative musculoskeletal ROS (+)   Abdominal (+) + obese,   Peds negative pediatric ROS (+)  Hematology negative hematology ROS (+)   Anesthesia Other Findings Endometrial Cancer  Reproductive/Obstetrics negative OB ROS                             Anesthesia Physical  Anesthesia Plan  ASA: III  Anesthesia Plan: General   Post-op Pain Management:    Induction: Intravenous  PONV Risk Score and Plan: 3 and Ondansetron, Dexamethasone and Midazolam  Airway Management Planned: Oral ETT  Additional Equipment:   Intra-op Plan:   Post-operative Plan: Extubation in OR  Informed Consent: I have reviewed the patients History and Physical, chart, labs and discussed the procedure including the risks, benefits and alternatives for the proposed anesthesia with the patient or authorized representative who has indicated his/her understanding and acceptance.     Dental advisory given  Plan Discussed with: Anesthesiologist and CRNA  Anesthesia Plan Comments:         Anesthesia Quick  Evaluation

## 2018-12-19 NOTE — Interval H&P Note (Signed)
History and Physical Interval Note:  12/19/2018 10:44 AM  [name]  has presented today for surgery, with the diagnosis of ENDOMETRIAL CANCER  The various methods of treatment have been discussed with the patient and family. After consideration of risks, benefits and other options for treatment, the patient has consented to  Procedure(s): XI ROBOTIC ASSISTED TOTAL HYSTERECTOMY (UTERUS GREATER THAN 250gr) WITH BILATERAL SALPINGO OOPHORECTOMY (Bilateral) SENTINEL NODE BIOPSY (N/A) as a surgical intervention .  The patient's history has been reviewed, patient examined, no change in status, stable for surgery.  I have reviewed the patient's chart and labs.  Questions were answered to the patient's satisfaction.     Thereasa Solo

## 2018-12-19 NOTE — Anesthesia Procedure Notes (Signed)
Procedure Name: Intubation Date/Time: 12/19/2018 1:34 PM Performed by: Genelle Bal, CRNA Pre-anesthesia Checklist: Patient identified, Emergency Drugs available, Suction available and Patient being monitored Patient Re-evaluated:Patient Re-evaluated prior to induction Oxygen Delivery Method: Circle system utilized Preoxygenation: Pre-oxygenation with 100% oxygen Induction Type: IV induction Ventilation: Mask ventilation without difficulty Laryngoscope Size: Miller and 2 Grade View: Grade I Tube type: Oral Tube size: 7.0 mm Number of attempts: 1 Airway Equipment and Method: Stylet Placement Confirmation: ETT inserted through vocal cords under direct vision,  positive ETCO2 and breath sounds checked- equal and bilateral Secured at: 22 cm Tube secured with: Tape Dental Injury: Teeth and Oropharynx as per pre-operative assessment

## 2018-12-20 ENCOUNTER — Encounter (HOSPITAL_COMMUNITY): Payer: Self-pay | Admitting: Gynecologic Oncology

## 2018-12-20 DIAGNOSIS — G35 Multiple sclerosis: Secondary | ICD-10-CM | POA: Diagnosis not present

## 2018-12-20 DIAGNOSIS — C541 Malignant neoplasm of endometrium: Secondary | ICD-10-CM | POA: Diagnosis not present

## 2018-12-20 DIAGNOSIS — D251 Intramural leiomyoma of uterus: Secondary | ICD-10-CM | POA: Diagnosis not present

## 2018-12-20 DIAGNOSIS — C775 Secondary and unspecified malignant neoplasm of intrapelvic lymph nodes: Secondary | ICD-10-CM | POA: Diagnosis not present

## 2018-12-20 DIAGNOSIS — N800 Endometriosis of uterus: Secondary | ICD-10-CM | POA: Diagnosis not present

## 2018-12-20 LAB — BASIC METABOLIC PANEL
Anion gap: 10 (ref 5–15)
BUN: 16 mg/dL (ref 6–20)
CO2: 24 mmol/L (ref 22–32)
Calcium: 8.2 mg/dL — ABNORMAL LOW (ref 8.9–10.3)
Chloride: 103 mmol/L (ref 98–111)
Creatinine, Ser: 1.2 mg/dL — ABNORMAL HIGH (ref 0.44–1.00)
GFR calc Af Amer: 58 mL/min — ABNORMAL LOW (ref >60)
GFR calc non Af Amer: 50 mL/min — ABNORMAL LOW (ref >60)
Glucose, Bld: 169 mg/dL — ABNORMAL HIGH (ref 70–99)
Potassium: 4.7 mmol/L (ref 3.5–5.1)
Sodium: 137 mmol/L (ref 135–145)

## 2018-12-20 LAB — CBC
HEMATOCRIT: 30.5 % — AB (ref 36.0–46.0)
Hemoglobin: 9.2 g/dL — ABNORMAL LOW (ref 12.0–15.0)
MCH: 25.8 pg — ABNORMAL LOW (ref 26.0–34.0)
MCHC: 30.2 g/dL (ref 30.0–36.0)
MCV: 85.7 fL (ref 80.0–100.0)
Platelets: 374 10*3/uL (ref 150–400)
RBC: 3.56 MIL/uL — ABNORMAL LOW (ref 3.87–5.11)
RDW: 13.7 % (ref 11.5–15.5)
WBC: 11.6 10*3/uL — ABNORMAL HIGH (ref 4.0–10.5)
nRBC: 0 % (ref 0.0–0.2)

## 2018-12-20 LAB — GLUCOSE, CAPILLARY
Glucose-Capillary: 151 mg/dL — ABNORMAL HIGH (ref 70–99)
Glucose-Capillary: 144 mg/dL — ABNORMAL HIGH (ref 70–99)

## 2018-12-20 MED ORDER — SODIUM CHLORIDE 0.9 % IV BOLUS
500.0000 mL | Freq: Once | INTRAVENOUS | Status: AC
  Administered 2018-12-20: 500 mL via INTRAVENOUS

## 2018-12-20 MED ORDER — METOCLOPRAMIDE HCL 5 MG PO TABS: 5.0000 mg | ORAL_TABLET | Freq: Three times a day (TID) | ORAL | 0 refills | Status: DC

## 2018-12-20 MED ORDER — SENNOSIDES-DOCUSATE SODIUM 8.6-50 MG PO TABS: 2.0000 | ORAL_TABLET | Freq: Every day | ORAL | 1 refills | Status: DC

## 2018-12-20 NOTE — Discharge Summary (Signed)
Physician Discharge Summary  Patient ID: [name] MRN: [id number] DOB/AGE: 02/21/1961 58 y.o.  Admit date: 12/19/2018 Discharge date: 12/20/2018  Admission Diagnoses: Endometrial cancer [facility])  Discharge Diagnoses:  Principal Problem:   Endometrial cancer ([place]) Active Problems:   Multiple sclerosis ([place])   Type 2 diabetes mellitus with diabetic polyneuropathy, without long-term current use of insulin ([place])   Morbid obesity ([place])   Discharged Condition:  The patient is in good condition and stable for discharge.    Hospital Course: On 12/19/2018, the patient underwent the following: Procedure(s): XI ROBOTIC ASSISTED TOTAL HYSTERECTOMY (UTERUS GREATER THAN 250gr) WITH BILATERAL SALPINGO OOPHORECTOMY SENTINEL NODE BIOPSY.  The postoperative course was uneventful.  She was discharged to home on postoperative day 1 tolerating a regular diet, passing flatus, pain controlled, adequate output.   Consults: None  Significant Diagnostic Studies: None  Treatments: surgery: see above  Discharge Exam: Blood pressure 103/60, pulse 70, temperature 98.2 F (36.8 C), temperature source Oral, resp. rate 16, height '5\' 7"'$  (1.702 m), weight 290 lb 14.4 oz (132 kg), SpO2 100 %. General appearance: alert, cooperative and no distress Resp: clear to auscultation bilaterally Cardio: regular rate and rhythm, S1, S2 normal, no murmur, click, rub or gallop GI: soft, non-tender; bowel sounds normal; no masses,  no organomegaly Extremities: extremities normal, atraumatic, no cyanosis or edema and pt wheelchair bound Incision/Wound: Lap sites to the abdomen with dermabond without erythema or drainage. Dry bandage to incision at the umbilicus intact.  Disposition: Discharge disposition: 01-Home or Self Care       Discharge Instructions    Call MD for:  difficulty breathing, headache or visual disturbances   Complete by:  As directed    Call MD for:  extreme fatigue   Complete by:  As directed    Call  MD for:  hives   Complete by:  As directed    Call MD for:  persistant dizziness or light-headedness   Complete by:  As directed    Call MD for:  persistant nausea and vomiting   Complete by:  As directed    Call MD for:  redness, tenderness, or signs of infection (pain, swelling, redness, odor or green/yellow discharge around incision site)   Complete by:  As directed    Call MD for:  severe uncontrolled pain   Complete by:  As directed    Call MD for:  temperature >100.4   Complete by:  As directed    Diet - low sodium heart healthy   Complete by:  As directed    Increase activity slowly   Complete by:  As directed    Lifting restrictions   Complete by:  As directed    No lifting greater than 10 lbs.   Sexual Activity Restrictions   Complete by:  As directed    No sexual activity, nothing in the vagina, for 8 weeks.     Allergies as of 12/20/2018   No Known Allergies     Medication List    TAKE these medications   atorvastatin 10 MG tablet Commonly known as:  LIPITOR Take 10 mg by mouth every evening.   baclofen 20 MG tablet Commonly known as:  LIORESAL TAKE 1 TABLET BY MOUTH FOUR TIMES A DAY**OFFICE VISIT NEEDED** What changed:  See the new instructions.   D3-1000 25 MCG (1000 UT) capsule Generic drug:  Cholecalciferol Take 1,000 Units by mouth 2 (two) times daily. MORNING & AFTERNOON.   Disposable Gloves Misc Use disposable gloves to  change catheter as needed   DOVER UNIVERSAL FOLEY TRAY Kit Use foley tray kit to change catheter every 3 weeks or as needed   Foley Catheter 2-Way Misc Use to aspirate urine suprapubic   DUODERM CGF DRESSING Misc Apply 1 each topically daily as needed.   furosemide 20 MG tablet Commonly known as:  LASIX TAKE 1 TABLET BY MOUTH ONCE DAILY   glipiZIDE 5 MG tablet Commonly known as:  GLUCOTROL Take 1 tablet (5 mg total) by mouth daily before lunch. What changed:  when to take this   GRX HYDROGEL GAUZE 4X4 Pads Use gauze to  clean wound daily as needed   hydrochlorothiazide 25 MG tablet Commonly known as:  HYDRODIURIL Take 25 mg by mouth daily.   Interferon Beta-1b 0.3 MG Kit injection Commonly known as:  BETASERON Inject 0.3 mg into the skin every other day.   metFORMIN 500 MG tablet Commonly known as:  GLUCOPHAGE Take 500 mg by mouth 2 (two) times daily.   metoCLOPramide 5 MG tablet Commonly known as:  REGLAN Take 1 tablet (5 mg total) by mouth 4 (four) times daily -  before meals and at bedtime.   MOVANTIK 25 MG Tabs tablet Generic drug:  naloxegol oxalate Take 25 mg by mouth every other day. IN THE MORNING.   NEW IMAGE SKIN/FLANGE/TAPE Misc 1 application by Does not apply route daily as needed.   NON FORMULARY Power wheelchair repairs  Diagnosis code z99.3   NON FORMULARY Dispense catheter urine drainage bags   nystatin ointment Commonly known as:  MYCOSTATIN Apply to affected area twice daily as needed for irritation   oxyCODONE 20 mg 12 hr tablet Commonly known as:  OXYCONTIN Take 1 tablet (20 mg total) by mouth 3 (three) times daily as needed (pain).   oxyCODONE-acetaminophen 10-325 MG tablet Commonly known as:  PERCOCET Take 1 tablet by mouth every 8 (eight) hours as needed for pain. Needs office visit.   PAPER TAPE 1"X10YD Tape Apply to dressings as needed   potassium chloride 10 MEQ tablet Commonly known as:  K-DUR,KLOR-CON TAKE 3 TABLETS BY MOUTH TWICE DAILY**OFFICE VISIT NEEDED FOR REFILLS** What changed:  See the new instructions.   senna-docusate 8.6-50 MG tablet Commonly known as:  Senokot-S Take 2 tablets by mouth at bedtime. Do not take if having loose stools   sulfamethoxazole-trimethoprim 800-160 MG tablet Commonly known as:  BACTRIM DS,SEPTRA DS Take 1 tablet by mouth 2 (two) times daily.   Syringe (Disposable) 30 ML Misc Commonly known as:  B-D SYRINGE LUER-LOK 30CC Use to change catheter   Tena Protect Underwear Pls/XLg Misc Use for incontinence.    TENA FLEX 16 PLUS Misc Use daily as needed   vitamin B-12 500 MCG tablet Commonly known as:  CYANOCOBALAMIN Take 500 mcg by mouth daily.      Follow-up Information    Everitt Amber, MD Follow up on 01/09/2019.   Specialty:  Gynecologic Oncology Why:  at 3pm at the [facility] information: [address] [place] [address] [phone]           Greater than thirty minutes were spend for face to face discharge instructions and discharge orders/summary in EPIC.   Signed: Dorothyann Gibbs 12/20/2018, 11:42 AM

## 2018-12-20 NOTE — Progress Notes (Signed)
Writer went to give patient her 8am insulin. Patient states," I don't take insulin at home. Writer educated patient that in the hospital patients are given insulin instead of po diabetic medication to control their blood sugar. Patient states," I don't want it yet. Come back after I eat. Writer asked patient if she has ordered breakfast. Patient states," I don't eat this early." Writer try to hand the menu to patient. Patient said just put it on the bedside table. Will continue to monitor.

## 2018-12-20 NOTE — Discharge Instructions (Signed)
12/20/2018  Return to work: 4-6 weeks if applicable  Activity: 1. Be up and out of the bed during the day.  Take a nap if needed.  You may walk up steps but be careful and use the hand rail.  Stair climbing will tire you more than you think, you may need to stop part way and rest.   2. No lifting or straining for 6 weeks.  3. Shower daily.  Use soap and water on your incision and pat dry; don't rub.  No tub baths until cleared by your surgeon.   4. No sexual activity and nothing in the vagina for 8 weeks.  5. You may experience a small amount of clear drainage from your incisions, which is normal.  If the drainage persists or increases, please call the office.  6. You may experience vaginal spotting after surgery or around the 6-8 week mark from surgery when the stitches at the top of the vagina begin to dissolve.  The spotting is normal but if you experience heavy bleeding, call our office.  7. Take Tylenol or ibuprofen first for pain and only use Percocet for severe pain not relieved by the Tylenol or Ibuprofen.  Monitor your Tylenol intake to a max of 4,000 mg a day since Percocet has Tylenol in it as well.  Diet: 1. Low sodium Heart Healthy Diet is recommended.  2. It is safe to use a laxative, such as Miralax or Colace, if you have difficulty moving your bowels. You can take Sennakot at bedtime every evening to keep bowel movements regular and to prevent constipation.    Wound Care: 1. Keep clean and dry.  Shower daily.  Reasons to call the Doctor:  Fever - Oral temperature greater than 100.4 degrees Fahrenheit  Foul-smelling vaginal discharge  Difficulty urinating  Nausea and vomiting  Increased pain at the site of the incision that is unrelieved with pain medicine.  Difficulty breathing with or without chest pain  New calf pain especially if only on one side  Sudden, continuing increased vaginal bleeding with or without clots.   Contacts: For questions or concerns  you should contact:  Dr. Everitt Amber at [phone]  Joylene John, NP at [phone]  After Hours: call [phone] and have the GYN Oncologist paged/contacted  Metoclopramide tablets What is this medicine? METOCLOPRAMIDE (met oh kloe PRA mide) is used to treat the symptoms of gastroesophageal reflux disease (GERD) like heartburn. It is also used to treat people with slow emptying of the stomach and intestinal tract. This medicine may be used for other purposes; ask your health care provider or pharmacist if you have questions. COMMON BRAND NAME(S): Reglan What should I tell my health care provider before I take this medicine? They need to know if you have any of these conditions: -breast cancer -depression -diabetes -heart failure -high blood pressure -kidney disease -liver disease -Parkinson's disease or a movement disorder -pheochromocytoma -seizures -stomach obstruction, bleeding, or perforation -an unusual or allergic reaction to metoclopramide, procainamide, sulfites, other medicines, foods, dyes, or preservatives -pregnant or trying to get pregnant -breast-feeding How should I use this medicine? Take this medicine by mouth with a glass of water. Follow the directions on the prescription label. Take this medicine on an empty stomach, about 30 minutes before eating. Take your doses at regular intervals. Do not take your medicine more often than directed. Do not stop taking except on the advice of your doctor or health care professional. A special MedGuide will be given to  you by the pharmacist with each prescription and refill. Be sure to read this information carefully each time. Talk to your pediatrician regarding the use of this medicine in children. Special care may be needed. Overdosage: If you think you have taken too much of this medicine contact a poison control center or emergency room at once. NOTE: This medicine is only for you. Do not share this medicine with  others. What if I miss a dose? If you miss a dose, take it as soon as you can. If it is almost time for your next dose, take only that dose. Do not take double or extra doses. What may interact with this medicine? -acetaminophen -cyclosporine -digoxin -medicines for blood pressure -medicines for diabetes, including insulin -medicines for hay fever and other allergies -medicines for depression, especially a Monoamine Oxidase Inhibitor (MAOI) -medicines for Parkinson's disease, like levodopa -medicines for sleep or for pain -quinidine -tetracycline This list may not describe all possible interactions. Give your health care provider a list of all the medicines, herbs, non-prescription drugs, or dietary supplements you use. Also tell them if you smoke, drink alcohol, or use illegal drugs. Some items may interact with your medicine. What should I watch for while using this medicine? It may take a few weeks for your stomach condition to start to get better. However, do not take this medicine for longer than 12 weeks. The longer you take this medicine, and the more you take it, the greater your chances are of developing serious side effects. If you are an elderly patient, a female patient, or you have diabetes, you may be at an increased risk for side effects from this medicine. Contact your doctor immediately if you start having movements you cannot control such as lip smacking, rapid movements of the tongue, involuntary or uncontrollable movements of the eyes, head, arms and legs, or muscle twitches and spasms. Patients and their families should watch out for worsening depression or thoughts of suicide. Also watch out for any sudden or severe changes in feelings such as feeling anxious, agitated, panicky, irritable, hostile, aggressive, impulsive, severely restless, overly excited and hyperactive, or not being able to sleep. If this happens, especially at the beginning of treatment or after a change in  dose, call your doctor. Do not treat yourself for high fever. Ask your doctor or health care professional for advice. You may get drowsy or dizzy. Do not drive, use machinery, or do anything that needs mental alertness until you know how this drug affects you. Do not stand or sit up quickly, especially if you are an older patient. This reduces the risk of dizzy or fainting spells. Alcohol can make you more drowsy and dizzy. Avoid alcoholic drinks. What side effects may I notice from receiving this medicine? Side effects that you should report to your doctor or health care professional as soon as possible: -allergic reactions like skin rash, itching or hives, swelling of the face, lips, or tongue -abnormal production of milk in females -breast enlargement in both males and females -change in the way you walk -difficulty moving, speaking or swallowing -drooling, lip smacking, or rapid movements of the tongue -excessive sweating -fever -involuntary or uncontrollable movements of the eyes, head, arms and legs -irregular heartbeat or palpitations -muscle twitches and spasms -unusually weak or tired Side effects that usually do not require medical attention (report to your doctor or health care professional if they continue or are bothersome): -change in sex drive or performance -depressed mood -diarrhea -difficulty  sleeping -headache -menstrual changes -restless or nervous This list may not describe all possible side effects. Call your doctor for medical advice about side effects. You may report side effects to FDA at 1-800-FDA-1088. Where should I keep my medicine? Keep out of the reach of children. Store at room temperature between 20 and 25 degrees C (68 and 77 degrees F). Protect from light. Keep container tightly closed. Throw away any unused medicine after the expiration date. NOTE: This sheet is a summary. It may not cover all possible information. If you have questions about this  medicine, talk to your doctor, pharmacist, or health care provider.  2019 Elsevier/Gold Standard (2016-09-08 15:13:45)

## 2018-12-20 NOTE — Progress Notes (Signed)
Discharge instructions explained to patient and family. No questions. IV removed. Patient wants to eat and take a bath then go home. NT will assist patient to the front of the building her her own wheel chair

## 2018-12-21 ENCOUNTER — Ambulatory Visit: Payer: Medicare Other

## 2018-12-21 NOTE — Anesthesia Postprocedure Evaluation (Signed)
Anesthesia Post Note  Patient: Remie Mathison  Procedure(s) Performed: XI ROBOTIC ASSISTED TOTAL HYSTERECTOMY (UTERUS GREATER THAN 250gr) WITH BILATERAL SALPINGO OOPHORECTOMY (Bilateral ) SENTINEL NODE BIOPSY (N/A )     Patient location during evaluation: PACU Anesthesia Type: General Level of consciousness: awake and alert Pain management: pain level controlled Vital Signs Assessment: post-procedure vital signs reviewed and stable Respiratory status: spontaneous breathing, nonlabored ventilation, respiratory function stable and patient connected to nasal cannula oxygen Cardiovascular status: blood pressure returned to baseline and stable Postop Assessment: no apparent nausea or vomiting Anesthetic complications: no    Last Vitals:  Vitals:   12/20/18 0502 12/20/18 1043  BP: (!) 106/40 103/60  Pulse: 81 70  Resp: 16   Temp: 36.8 C 36.8 C  SpO2: 100% 100%    Last Pain:  Vitals:   12/20/18 1043  TempSrc: Oral  PainSc:                  Effie Berkshire

## 2018-12-22 ENCOUNTER — Encounter (HOSPITAL_BASED_OUTPATIENT_CLINIC_OR_DEPARTMENT_OTHER): Payer: Medicare Other | Attending: Internal Medicine

## 2018-12-22 DIAGNOSIS — I10 Essential (primary) hypertension: Secondary | ICD-10-CM | POA: Insufficient documentation

## 2018-12-22 DIAGNOSIS — L89312 Pressure ulcer of right buttock, stage 2: Secondary | ICD-10-CM | POA: Insufficient documentation

## 2018-12-22 DIAGNOSIS — G35 Multiple sclerosis: Secondary | ICD-10-CM | POA: Insufficient documentation

## 2018-12-22 DIAGNOSIS — E1140 Type 2 diabetes mellitus with diabetic neuropathy, unspecified: Secondary | ICD-10-CM | POA: Insufficient documentation

## 2018-12-22 DIAGNOSIS — Z993 Dependence on wheelchair: Secondary | ICD-10-CM | POA: Insufficient documentation

## 2018-12-26 DIAGNOSIS — N318 Other neuromuscular dysfunction of bladder: Secondary | ICD-10-CM | POA: Diagnosis not present

## 2018-12-29 ENCOUNTER — Telehealth: Payer: Self-pay | Admitting: Gynecologic Oncology

## 2018-12-29 DIAGNOSIS — G35 Multiple sclerosis: Secondary | ICD-10-CM | POA: Diagnosis not present

## 2018-12-29 DIAGNOSIS — L89319 Pressure ulcer of right buttock, unspecified stage: Secondary | ICD-10-CM | POA: Diagnosis not present

## 2018-12-29 DIAGNOSIS — I10 Essential (primary) hypertension: Secondary | ICD-10-CM | POA: Diagnosis not present

## 2018-12-29 DIAGNOSIS — E1140 Type 2 diabetes mellitus with diabetic neuropathy, unspecified: Secondary | ICD-10-CM | POA: Diagnosis not present

## 2018-12-29 DIAGNOSIS — Z993 Dependence on wheelchair: Secondary | ICD-10-CM | POA: Diagnosis not present

## 2018-12-29 DIAGNOSIS — L89312 Pressure ulcer of right buttock, stage 2: Secondary | ICD-10-CM | POA: Diagnosis not present

## 2018-12-29 NOTE — Telephone Encounter (Signed)
I discussed with [name] her diagnosis of a stage Ib grade 2 endometrioid endometrial cancer with deep myometrial invasion, lymphovascular space invasion, and isolated tumor cells in the sentinel lymph node.  This constellation of pathologic findings means that she has a high risk tumor for recurrence.  In accordance with NCCN guidelines we recommend adjuvant radiation.  Given her the LVSI and isolated tumor cells I am recommending consideration for whole pelvic radiation in addition to vaginal brachii therapy at the discretion of Dr. Sondra Come.  We will facilitate consultation with Dr. Sondra Come. Thereasa Solo, MD

## 2019-01-01 ENCOUNTER — Encounter: Payer: Self-pay | Admitting: Oncology

## 2019-01-01 DIAGNOSIS — C541 Malignant neoplasm of endometrium: Secondary | ICD-10-CM

## 2019-01-03 ENCOUNTER — Telehealth: Payer: Self-pay | Admitting: Oncology

## 2019-01-03 ENCOUNTER — Encounter: Payer: Self-pay | Admitting: Oncology

## 2019-01-03 NOTE — Telephone Encounter (Signed)
[facility] and advised her of appointment with Dr. Sondra Come on 01/22/19 at 1:30 pm.  She verbalized agreement and asked for a letter with appointment date and time for her transportation.  Letter sent today.

## 2019-01-09 ENCOUNTER — Ambulatory Visit: Payer: Medicare Other

## 2019-01-09 ENCOUNTER — Inpatient Hospital Stay: Payer: Medicare Other | Attending: Gynecologic Oncology | Admitting: Gynecologic Oncology

## 2019-01-09 ENCOUNTER — Encounter: Payer: Self-pay | Admitting: Gynecologic Oncology

## 2019-01-09 ENCOUNTER — Telehealth: Payer: Self-pay | Admitting: Family Medicine

## 2019-01-09 VITALS — BP 131/71 | HR 88 | Temp 98.0°F | Resp 18

## 2019-01-09 DIAGNOSIS — C541 Malignant neoplasm of endometrium: Secondary | ICD-10-CM | POA: Diagnosis not present

## 2019-01-09 DIAGNOSIS — Z993 Dependence on wheelchair: Secondary | ICD-10-CM | POA: Insufficient documentation

## 2019-01-09 DIAGNOSIS — Z90710 Acquired absence of both cervix and uterus: Secondary | ICD-10-CM | POA: Diagnosis not present

## 2019-01-09 DIAGNOSIS — Z7189 Other specified counseling: Secondary | ICD-10-CM | POA: Insufficient documentation

## 2019-01-09 DIAGNOSIS — Z90722 Acquired absence of ovaries, bilateral: Secondary | ICD-10-CM | POA: Diagnosis not present

## 2019-01-09 DIAGNOSIS — G35 Multiple sclerosis: Secondary | ICD-10-CM

## 2019-01-09 DIAGNOSIS — N318 Other neuromuscular dysfunction of bladder: Secondary | ICD-10-CM | POA: Diagnosis not present

## 2019-01-09 NOTE — Telephone Encounter (Signed)
Copied from [place] [phone]. Topic: General - Other >> Jan 09, 2019  3:55 PM Yvette Rack wrote: Reason for CRM: Cloyd Stagers with [facility] called in to see if the fax from [place] for incontinence supplies. Cb# [phone]

## 2019-01-09 NOTE — Progress Notes (Signed)
Follow-up Note: Gyn-Onc  Consult was initially requested by Dr. Mora Bellman for the evaluation of [name] 58 y.o. female  CC:  Chief Complaint  Patient presents with  . endometrial cancer    Assessment/Plan:  [name]  is a 58 y.o.  year old with Alexandria (wheelchair bound) with stage IB grade 2 endometrial cancer with positive ITC's in the SLN's and LVSI.  High/intermediate risk factors for recurrence. Recommendation is for external beam radiation and vaginal brachytherapy to reduce risk for local recurrence in accordance with NCCN guidelines.  I discussed this with the patient. I discussed the role of adjuvant therapy. I discussed prognosis and risk for recurrence. We reviewed symptoms concerning for recurrence and she will see me if these develop prior to her scheduled appointment.  After completing adjuvant therapy I recommend she follow-up at 3 monthly intervals for symptom review, physical examination and pelvic examination. Pap smear is not recommended in routine endometrial cancer surveillance. After 2 years we will space these visits to every 6 months, and then annually if recurrence has not developed within 5 years. All questions were answered.  I will see her back in 1 week to assess her vaginal cuff.  HPI: [name] is a 58 year old P1 who is seen in consultation at the request of Dr Elly Modena for grade 2 endometrial cancer.  The patient reports a history of vaginal bleeding for 6 months (since the summer of 2019).  Formed a transvaginal ultrasound scan on November 15, 2018 which revealed a uterus measuring 11.5 x 7.2 x 7.3 cm with a posterior lower uterine segment myoma measuring approximately 6 cm.  The endometrium was thickened to 34 mm.  The left and right ovaries were not clearly visualized.  An endometrial biopsy was then taken in the office which revealed FIGO grade 2 endometrioid adenocarcinoma.  The patient has a medical history significant for multiple  sclerosis.  She is wheelchair-bound.  She has a suprapubic catheter.  She has spasms and no function in her lower extremities.  She has multiple decubitus ulcers.  She lives with her daughter.  She is independent through the day in her motorized wheelchair.  She requires [place]oyer lift for transfers.  Her associated medical history significant for hypertension, diabetes mellitus on oral medications, and arthritis  Her family history is significant for a mother who had a history of breast cancer.  A suprapubic catheter was placed in 2009 and then later replaced.  She has had no other prior abdominal surgeries.  Her neurologist is Dr. Felecia Shelling. Endometrial biopsy was performed on 11/10/18 which showed FIGO grade 2 endometrial cancer. [place] on November 15, 2018 showed a uterus measuring 11.5 x 7.2 x 7.3 cm with a 5 cm posterior leiomyoma in the mid to lower uterine segment.  There was a thickened endometrium at 34 mm.  The ovaries were not visualized.  There is no free fluid.  Interval Hx:  On 12/19/18 she underwent robotic assisted total hysterectomy BSO sentinel lymph node biopsy.  The uterus is greater than 250 g.  The procedure was complicated by morbid obesity and multiple sclerosis, paralysis.  Final pathology revealed a FIGO grade 2 endometrial cancer with outer half myometrial invasion (30 mm of 40 mm depth invasion close (.  LVSI was identified.  There was carcinoma involving the lower uterine segment.  The tumor size was 9.5 cm.  Ovaries and fallopian tubes and cervix were unremarkable.  There were no macro micrometastases and sentinel lymph  nodes however isolated tumor cells were identified in the left obturator sentinel lymph node.  She was staged as FIGO stage Ib grade 2 adenocarcinoma of the endometrium with high risk features and recommendation for adjuvant radiation was made in accordance with NCCN guidelines.  Postoperatively she is done well with no discrete complaints.  Current Meds:    Current Outpatient Medications:  .  Adhesive Tape (PAPER TAPE 1"X10YD) TAPE, Apply to dressings as needed, Disp: 4 each, Rfl: 6 .  atorvastatin (LIPITOR) 10 MG tablet, Take 10 mg by mouth daily. , Disp: , Rfl:  .  baclofen (LIORESAL) 20 MG tablet, TAKE 1 TABLET BY MOUTH FOUR TIMES A DAY**OFFICE VISIT NEEDED**, Disp: 120 tablet, Rfl: 0 .  Catheters (DOVER UNIVERSAL FOLEY TRAY) KIT, Use foley tray kit to change catheter every 3 weeks or as needed, Disp: 15 each, Rfl: 1 .  Catheters (FOLEY CATHETER 2-WAY) MISC, Use to aspirate urine suprapubic, Disp: 15 each, Rfl: 1 .  Cholecalciferol (D3-1000) 1000 units capsule, Take 2,000 Units by mouth daily. , Disp: , Rfl:  .  Control Gel Formula Dressing (DUODERM CGF DRESSING) MISC, Apply 1 each topically daily as needed., Disp: 5 each, Rfl: 11 .  cyanocobalamin 500 MCG tablet, Take 500 mcg by mouth daily., Disp: , Rfl:  .  Disposable Gloves MISC, Use disposable gloves to change catheter as needed, Disp: 200 each, Rfl: 11 .  furosemide (LASIX) 20 MG tablet, Take 1 tablet (20 mg total) by mouth daily., Disp: 90 tablet, Rfl: 0 .  glipiZIDE (GLUCOTROL) 5 MG tablet, Take 1 tablet (5 mg total) by mouth daily before lunch., Disp: 90 tablet, Rfl: 3 .  hydrochlorothiazide (HYDRODIURIL) 25 MG tablet, TAKE 1 TABLET BY MOUTH EVERY MORNING, Disp: 90 tablet, Rfl: 0 .  Incontinence Supply Disposable (TENA FLEX 16 PLUS) MISC, Use daily as needed, Disp: 30 each, Rfl: 11 .  Incontinence Supply Disposable (TENA PROTECT UNDERWEAR PLS/XLG) MISC, Use for incontinence., Disp: 30 each, Rfl: 11 .  Interferon Beta-1b (BETASERON) 0.3 MG KIT injection, Inject 0.3 mg into the skin every other day., Disp: 36 each, Rfl: 3 .  lidocaine (XYLOCAINE) 2 % jelly, USE when need with dressings, Disp: , Rfl: 1 .  metFORMIN (GLUCOPHAGE) 1000 MG tablet, Take 1,000 mg by mouth 2 (two) times daily with a meal., Disp: , Rfl:  .  Methenamine-Sodium Salicylate (AZO URINARY TRACT DEFENSE) 162-162.5 MG  TABS, Take as instructed, Disp: , Rfl:  .  MOVANTIK 25 MG TABS tablet, Take 25 mg by mouth every morning., Disp: , Rfl: 4 .  NON FORMULARY, Power wheelchair repairs  Diagnosis code z99.3, Disp: 1 each, Rfl: 0 .  NON FORMULARY, Dispense catheter urine drainage bags, Disp: 90 Bag, Rfl: 3 .  nystatin ointment (MYCOSTATIN), Apply to affected area twice daily as needed for irritation, Disp: 30 g, Rfl: 6 .  Ostomy Supplies (NEW IMAGE SKIN/FLANGE/TAPE) MISC, 1 application by Does not apply route daily as needed., Disp: 30 each, Rfl: 11 .  oxyCODONE (OXYCONTIN) 20 mg 12 hr tablet, Take 1 tablet (20 mg total) by mouth 3 (three) times daily as needed (pain)., Disp: 90 tablet, Rfl: 0 .  oxyCODONE-acetaminophen (PERCOCET) 10-325 MG tablet, Take 1 tablet by mouth every 8 (eight) hours as needed for pain. Needs office visit., Disp: 90 tablet, Rfl: 0 .  potassium chloride (K-DUR,KLOR-CON) 10 MEQ tablet, TAKE 3 TABLETS BY MOUTH TWICE DAILY**OFFICE VISIT NEEDED FOR REFILLS**, Disp: 180 tablet, Rfl: 0 .  Syringe, Disposable, (B-D SYRINGE LUER-LOK  30CC) 30 ML MISC, Use to change catheter, Disp: 15 each, Rfl: 0 .  Wound Dressings (GRX HYDROGEL GAUZE 4X4) PADS, Use gauze to clean wound daily as needed, Disp: 200 each, Rfl: 11   Allergy: No Known Allergies  Social Hx:   Social History   Socioeconomic History  . Marital status: Single    Spouse name: Not on file  . Number of children: Not on file  . Years of education: Not on file  . Highest education level: Not on file  Occupational History  . Not on file  Social Needs  . Financial resource strain: Not on file  . Food insecurity:    Worry: Not on file    Inability: Not on file  . Transportation needs:    Medical: Not on file    Non-medical: Not on file  Tobacco Use  . Smoking status: Never Smoker  . Smokeless tobacco: Never Used  Substance and Sexual Activity  . Alcohol use: No  . Drug use: No  . Sexual activity: Not on file  Lifestyle  .  Physical activity:    Days per week: Not on file    Minutes per session: Not on file  . Stress: Not on file  Relationships  . Social connections:    Talks on phone: Not on file    Gets together: Not on file    Attends religious service: Not on file    Active member of club or organization: Not on file    Attends meetings of clubs or organizations: Not on file    Relationship status: Not on file  . Intimate partner violence:    Fear of current or ex partner: Not on file    Emotionally abused: Not on file    Physically abused: Not on file    Forced sexual activity: Not on file  Other Topics Concern  . Not on file  Social History Narrative  . Not on file    Past Surgical Hx:  Past Surgical History:  Procedure Laterality Date  . COLONOSCOPY    . ESOPHAGOGASTRODUODENOSCOPY N/A 12/02/2017   Procedure: ESOPHAGOGASTRODUODENOSCOPY (EGD);  Surgeon: Ronnette Juniper, MD;  Location: [facility];  Service: Gastroenterology;  Laterality: N/A;  . ROBOTIC ASSISTED TOTAL HYSTERECTOMY WITH BILATERAL SALPINGO OOPHERECTOMY Bilateral 12/19/2018   Procedure: XI ROBOTIC ASSISTED TOTAL HYSTERECTOMY (UTERUS GREATER THAN 250gr) WITH BILATERAL SALPINGO OOPHORECTOMY;  Surgeon: Everitt Amber, MD;  Location: [facility];  Service: Gynecology;  Laterality: Bilateral;  . SENTINEL NODE BIOPSY N/A 12/19/2018   Procedure: SENTINEL NODE BIOPSY;  Surgeon: Everitt Amber, MD;  Location: [facility];  Service: Gynecology;  Laterality: N/A;  . SUPRAPUBIC CATHETER INSERTION  2009  . TONSILLECTOMY    . TUBAL LIGATION      Past Medical Hx:  Past Medical History:  Diagnosis Date  . Anemia    history of  . Arthritis   . Decubitus ulcers    currently treated at wound center  . Diabetes mellitus without complication ([place])   . Diabetic polyneuropathy ([place])   . Endometrial cancer ([facility])    Grade 2  . Fibroids   . Heartburn    Occ  . Hypertension   . Mixed hyperlipidemia   . Morbid obesity ([place])   . Alexandria (multiple sclerosis) ([place])      Past Gynecological History:  See HPI No LMP recorded. Patient is postmenopausal.  Family Hx:  Family History  Problem Relation Age of Onset  . Cancer Mother   . Hypertension Mother   .  Heart disease Father   . Diabetes Father   . Hypertension Sister   . Hypertension Brother     Review of Systems:  Constitutional  Feels well,   ENT Normal appearing ears and nares bilaterally Skin/Breast  No rash, sores, jaundice, itching, dryness Cardiovascular  No chest pain, shortness of breath, or edema  Pulmonary  No cough or wheeze.  Gastro Intestinal  No nausea, vomitting, or diarrhoea. No bright red blood per rectum, no abdominal pain,+constipation.  Genito Urinary  No frequency, urgency, dysuria, no bleeding Musculo Skeletal  No myalgia, arthralgia, joint swelling or pain  Neurologic  + spastic paralysis lower extremities Psychology  No depression, anxiety, insomnia.   Vitals:  Blood pressure 131/71, pulse 88, temperature 98 F (36.7 C), temperature source Axillary, resp. rate 18, SpO2 99 %.  Physical Exam: WD in NAD Neck  Supple NROM, without any enlargements.  Lymph Node Survey No cervical supraclavicular or inguinal adenopathy Cardiovascular  Pulse normal rate, regularity and rhythm. S1 and S2 normal.  Lungs  Clear to auscultation bilateraly, without wheezes/crackles/rhonchi. Good air movement.  Skin  No rash/lesions/breakdown  Psychiatry  Alert and oriented to person, place, and time  Abdomen  Normoactive bowel sounds, abdomen soft, non-tender and obese without evidence of hernia. Suprapubic catheter sites present. Well healed incisions. Back No CVA tenderness Genito Urinary  deferred Rectal  deferred Extremities  No bilateral cyanosis, clubbing or edema.   30 minutes of direct face to face counseling time was spent with the patient. This included discussion about prognosis, therapy recommendations and postoperative side effects and are beyond the  scope of routine postoperative care.   Thereasa Solo, MD  01/09/2019, 3:27 PM

## 2019-01-09 NOTE — Patient Instructions (Signed)
Please return to see Dr Denman George on 01/19/19. Please place the [place] provided lift pad underneath yourself prior to that visit so that Dr Denman George can perform a pelvic examination.

## 2019-01-10 ENCOUNTER — Other Ambulatory Visit: Payer: Self-pay | Admitting: Family Medicine

## 2019-01-10 DIAGNOSIS — Z794 Long term (current) use of insulin: Secondary | ICD-10-CM | POA: Diagnosis not present

## 2019-01-10 DIAGNOSIS — E1165 Type 2 diabetes mellitus with hyperglycemia: Secondary | ICD-10-CM | POA: Diagnosis not present

## 2019-01-10 DIAGNOSIS — I10 Essential (primary) hypertension: Secondary | ICD-10-CM | POA: Diagnosis not present

## 2019-01-10 DIAGNOSIS — G35 Multiple sclerosis: Secondary | ICD-10-CM

## 2019-01-10 DIAGNOSIS — Z79891 Long term (current) use of opiate analgesic: Secondary | ICD-10-CM

## 2019-01-10 DIAGNOSIS — E782 Mixed hyperlipidemia: Secondary | ICD-10-CM | POA: Diagnosis not present

## 2019-01-10 DIAGNOSIS — E1142 Type 2 diabetes mellitus with diabetic polyneuropathy: Secondary | ICD-10-CM | POA: Diagnosis not present

## 2019-01-10 NOTE — Telephone Encounter (Deleted)
Copied from [place] [phone]. Topic: Quick Communication - Rx Refill/Question >> Jan 10, 2019 11:22 AM Scherrie Gerlach wrote: Medication: oxyCODONE-acetaminophen (PERCOCET) 10-325 MG tablet  [facility] [facility], [place], [place] [phone] (Phone) [phone] (Fax)

## 2019-01-10 NOTE — Telephone Encounter (Signed)
Copied from [place] [phone]. Topic: Quick Communication - Rx Refill/Question >> Jan 10, 2019 11:22 AM Scherrie Gerlach wrote: Medication: oxyCODONE-acetaminophen (PERCOCET) 10-325 MG tablet  [place], [place], [place] [phone] (Phone) [phone] (Fax)

## 2019-01-10 NOTE — Telephone Encounter (Signed)
Incontinence supplies faxed back today to [phone].  Spoke with Cloyd Stagers and advised incontinence supply form faxed back.  Appreciative of c/b and will reach out to [facility] and Ms. Anglemyer. Dgaddy, CMA

## 2019-01-11 ENCOUNTER — Other Ambulatory Visit: Payer: Self-pay | Admitting: Family Medicine

## 2019-01-12 NOTE — Telephone Encounter (Signed)
Requested medication (s) are due for refill today: baclofen yes  Requested medication (s) are on the active medication list: yes  Last refill: 07/30/18  Future visit scheduled: yes  Notes to clinic:  Not delegated  Requested medication (s) are due for refill today: potassium yes  Requested medication (s) are on the active medication list: yes  Last refill:  12/05/18  Future visit scheduled: yes  Notes to clinic:     Requested Prescriptions  Pending Prescriptions Disp Refills   baclofen (LIORESAL) 20 MG tablet [Pharmacy Med Name: BACLOFEN 20 MG TAB] 120 tablet 0    Sig: TAKE 1 TABLET BY MOUTH FOUR TIMES A DAY**OFFICE VISIT NEEDED**     Not Delegated - Analgesics:  Muscle Relaxants Failed - 01/11/2019 10:17 PM      Failed - This refill cannot be delegated      Passed - Valid encounter within last 6 months    Recent Outpatient Visits          5 months ago Chronically on opiate therapy   Primary Care at [facility], Zoe A, MD   1 year ago Type 2 diabetes mellitus with complication, without long-term current use of insulin ([place])   Primary Care at [facility], [name], MD   1 year ago Screening for cervical cancer   Primary Care at [facility], [name], MD   1 year ago Essential hypertension   Primary Care at [facility], [place] A, MD   1 year ago Indwelling Foley catheter present   Primary Care at [place], MD      Future Appointments            In 1 week Forrest Moron, MD Primary Care at [place], [facility]          potassium chloride (K-DUR,KLOR-CON) 10 MEQ tablet [Pharmacy Med Name: POTASSIUM CL 10MEQ ER TABLET] 180 tablet 0    Sig: TAKE 3 TABLETS BY MOUTH TWICE DAILY**OFFICE VISIT NEEDED FOR REFILLS**     Endocrinology:  Minerals - Potassium Supplementation Failed - 01/11/2019 10:17 PM      Failed - Cr in normal range and within 360 days    Creatinine, Ser  Date Value Ref Range Status  12/20/2018 1.20 (H) 0.44 - 1.00 mg/dL Final        Passed - K in normal range and within 360 days    Potassium  Date Value Ref Range Status  12/20/2018 4.7 3.5 - 5.1 mmol/L Final         Passed - Valid encounter within last 12 months    Recent Outpatient Visits          5 months ago Chronically on opiate therapy   Primary Care at [facility], Zoe A, MD   1 year ago Type 2 diabetes mellitus with complication, without long-term current use of insulin ([place])   Primary Care at [facility], [name], MD   1 year ago Screening for cervical cancer   Primary Care at [facility], [name], MD   1 year ago Essential hypertension   Primary Care at [facility], [name], MD   1 year ago Indwelling Foley catheter present   Primary Care at [facility], [name], MD      Future Appointments            In 1 week Forrest Moron, MD Primary Care at [place], [facility]

## 2019-01-13 NOTE — Telephone Encounter (Signed)
Please advise 

## 2019-01-15 NOTE — Progress Notes (Signed)
GYN Location of Tumor / Histology: stage IB grade 2 endometrial cancer with positive ITC's in the SLN's and LVSI.  [name] presented with symptoms of: a history of vaginal bleeding for 6 months (since the summer of 2019).  Formed a transvaginal ultrasound scan on November 15, 2018 which revealed a uterus measuring 11.5 x 7.2 x 7.3 cm with a posterior lower uterine segment myoma measuring approximately 6 cm.  The endometrium was thickened to 34 mm.  The left and right ovaries were not clearly visualized.  An endometrial biopsy was then taken in the office which revealed FIGO grade 2 endometrioid adenocarcinoma.  Biopsies revealed: 12/19/18:  Diagnosis 1. Lymph node, sentinel, biopsy, left obturator - ISOLATED TUMOR CELLS IN 1 OF 1 LYMPH NODE. - SEE COMMENT. 2. Lymph node, sentinel, biopsy, right external iliac - THERE IS NO EVIDENCE OF CARCINOMA IN 1 OF 1 LYMPH ODE (0/1). - SEE COMMENT. 3. Uterus +/- tubes/ovaries, neoplastic - INVASIVE ENDOMETRIOID ADENOCARCINOMA, GRADE II/III, SPANNING 9.5 CM. - ADENOCARCINOMA INVOLVES THE OUTER HALF OF THE MYOMETRIUM (EXTENDS 30 MM DEEP WHERE THE MYOMETRIUM IS 40 MM DEEP). - LYMPHOVASCULAR INVASION IS IDENTIFIED. - ADENOCARCINOMA INVOLVES THE LOWER UTERINE SEGMENT. - SEE ONCOLOGY TABLE BELOW. - MYOMETRIUM: ADENOMYOSIS. LEIOMYOMATA. - SEROSA: UNREMARKABLE. - RIGHT ADNEXA: BENIGN OVARY AND FALLOPIAN TUBE. THERE IS NO EVIDENCE OF MALIGNANCY. - LEFT ADNEXA: BENIGN OVARY AND FALLOPIAN TUBE. THERE IS NO EVIDENCE OF MALIGNANCY  Past/Anticipated interventions by Gyn/Onc surgery, if any: 12/19/18:  Operation: Robotic-assisted laparoscopic total hysterectomy >250gm with bilateral salpingoophorectomy, SLN biopsy.  Surgeon: Donaciano Eva  Past/Anticipated interventions by medical oncology, if any: None at this time  Weight changes, if any: No  Bowel/Bladder complaints, if any: pt has suprapubic catheter for urinary retention secondary to  MS  Nausea/Vomiting, if any: Nausea at times  Pain issues, if any:  Chronic, generalized pain attributed to MS  SAFETY ISSUES:  Prior radiation? No   Pacemaker/ICD? No  Possible current pregnancy? No  Is the patient on methotrexate? No  Current Complaints / other details:  Pt presents today for initial consult with Dr. Sondra Come for Radiation Oncology. Pt is unaccompanied.   BP 111/64 (BP Location: Left Arm, Patient Position: Sitting)   Pulse 76   Temp 98 F (36.7 C) (Oral)   Resp 20   SpO2 100%  Wt Readings from Last 3 Encounters:  12/19/18 290 lb 14.4 oz (132 kg)  11/10/18 200 lb (90.7 kg)  07/19/18 220 lb (99.8 kg)      The patient has a medical history significant for multiple sclerosis.  She is wheelchair-bound.  She has a suprapubic catheter.  She has spasms and no function in her lower extremities.  She has multiple decubitus ulcers.  She lives with her daughter.  She is independent through the day in her motorized wheelchair.  She requires Hoyer lift for transfers.  Alexandria Sousa, RN BSN

## 2019-01-17 DIAGNOSIS — H52203 Unspecified astigmatism, bilateral: Secondary | ICD-10-CM | POA: Diagnosis not present

## 2019-01-17 DIAGNOSIS — H5203 Hypermetropia, bilateral: Secondary | ICD-10-CM | POA: Diagnosis not present

## 2019-01-17 DIAGNOSIS — H2513 Age-related nuclear cataract, bilateral: Secondary | ICD-10-CM | POA: Diagnosis not present

## 2019-01-17 DIAGNOSIS — H43812 Vitreous degeneration, left eye: Secondary | ICD-10-CM | POA: Diagnosis not present

## 2019-01-17 DIAGNOSIS — E1142 Type 2 diabetes mellitus with diabetic polyneuropathy: Secondary | ICD-10-CM | POA: Diagnosis not present

## 2019-01-17 DIAGNOSIS — Z7984 Long term (current) use of oral hypoglycemic drugs: Secondary | ICD-10-CM | POA: Diagnosis not present

## 2019-01-17 DIAGNOSIS — H35372 Puckering of macula, left eye: Secondary | ICD-10-CM | POA: Diagnosis not present

## 2019-01-17 DIAGNOSIS — H524 Presbyopia: Secondary | ICD-10-CM | POA: Diagnosis not present

## 2019-01-17 DIAGNOSIS — G35 Multiple sclerosis: Secondary | ICD-10-CM | POA: Diagnosis not present

## 2019-01-18 MED ORDER — OXYCODONE-ACETAMINOPHEN 10-325 MG PO TABS: 1.0000 | ORAL_TABLET | Freq: Three times a day (TID) | ORAL | 0 refills | Status: DC | PRN

## 2019-01-19 ENCOUNTER — Inpatient Hospital Stay (HOSPITAL_BASED_OUTPATIENT_CLINIC_OR_DEPARTMENT_OTHER): Payer: Medicare Other | Admitting: Gynecologic Oncology

## 2019-01-19 ENCOUNTER — Encounter: Payer: Self-pay | Admitting: Gynecologic Oncology

## 2019-01-19 VITALS — BP 137/85 | HR 76 | Temp 98.1°F | Resp 18

## 2019-01-19 DIAGNOSIS — Z7189 Other specified counseling: Secondary | ICD-10-CM | POA: Diagnosis not present

## 2019-01-19 DIAGNOSIS — Z90710 Acquired absence of both cervix and uterus: Secondary | ICD-10-CM | POA: Diagnosis not present

## 2019-01-19 DIAGNOSIS — C541 Malignant neoplasm of endometrium: Secondary | ICD-10-CM

## 2019-01-19 DIAGNOSIS — Z90722 Acquired absence of ovaries, bilateral: Secondary | ICD-10-CM

## 2019-01-19 DIAGNOSIS — G35 Multiple sclerosis: Secondary | ICD-10-CM | POA: Diagnosis not present

## 2019-01-19 DIAGNOSIS — Z993 Dependence on wheelchair: Secondary | ICD-10-CM | POA: Diagnosis not present

## 2019-01-19 NOTE — Progress Notes (Signed)
Follow-up Note: Gyn-Onc  Consult was initially requested by Dr. Mora Bellman for the evaluation of Bobbe Quilter 58 y.o. female  CC:  Chief Complaint  Patient presents with  . Endometrial cancer [facility])    Assessment/Plan:  Ms. Karne Ozga  is a 58 y.o.  year old with MS (wheelchair bound) with stage IB grade 2 endometrial cancer with positive ITC's in the SLN's and LVSI.  High/intermediate risk factors for recurrence. Recommendation is for external beam radiation and vaginal brachytherapy to reduce risk for local recurrence in accordance with NCCN guidelines.  I discussed this with the patient. I discussed the role of adjuvant therapy. I discussed prognosis and risk for recurrence. We reviewed symptoms concerning for recurrence and she will see me if these develop prior to her scheduled appointment.  After completing adjuvant therapy I recommend she follow-up at 3 monthly intervals for symptom review, physical examination and pelvic examination. Pap smear is not recommended in routine endometrial cancer surveillance. After 2 years we will space these visits to every 6 months, and then annually if recurrence has not developed within 5 years. All questions were answered.  HPI: Ms Raylan Troiani is a 58 year old P1 who is seen in consultation at the request of Dr Elly Modena for grade 2 endometrial cancer.  The patient reports a history of vaginal bleeding for 6 months (since the summer of 2019).  Formed a transvaginal ultrasound scan on November 15, 2018 which revealed a uterus measuring 11.5 x 7.2 x 7.3 cm with a posterior lower uterine segment myoma measuring approximately 6 cm.  The endometrium was thickened to 34 mm.  The left and right ovaries were not clearly visualized.  An endometrial biopsy was then taken in the office which revealed FIGO grade 2 endometrioid adenocarcinoma.  The patient has a medical history significant for multiple sclerosis.  She is wheelchair-bound.  She has a  suprapubic catheter.  She has spasms and no function in her lower extremities.  She has multiple decubitus ulcers.  She lives with her daughter.  She is independent through the day in her motorized wheelchair.  She requires Hoyer lift for transfers.  Her associated medical history significant for hypertension, diabetes mellitus on oral medications, and arthritis  Her family history is significant for a mother who had a history of breast cancer.  A suprapubic catheter was placed in 2009 and then later replaced.  She has had no other prior abdominal surgeries.  Her neurologist is Dr. Felecia Shelling. Endometrial biopsy was performed on 11/10/18 which showed FIGO grade 2 endometrial cancer. [place] on November 15, 2018 showed a uterus measuring 11.5 x 7.2 x 7.3 cm with a 5 cm posterior leiomyoma in the mid to lower uterine segment.  There was a thickened endometrium at 34 mm.  The ovaries were not visualized.  There is no free fluid.  On 12/19/18 she underwent robotic assisted total hysterectomy BSO sentinel lymph node biopsy.  The uterus is greater than 250 g.  The procedure was complicated by morbid obesity and multiple sclerosis, paralysis.  Final pathology revealed a FIGO grade 2 endometrial cancer with outer half myometrial invasion (30 mm of 40 mm depth invasion close (.  LVSI was identified.  There was carcinoma involving the lower uterine segment.  The tumor size was 9.5 cm.  Ovaries and fallopian tubes and cervix were unremarkable.  There were no macro micrometastases and sentinel lymph nodes however isolated tumor cells were identified in the left obturator sentinel lymph node.  She  was staged as FIGO stage Ib grade 2 adenocarcinoma of the endometrium with high risk features and recommendation for adjuvant radiation was made in accordance with NCCN guidelines.  Interval Hx:  Postoperatively she is done well with no discrete complaints. She returns today for a vaginal cuff check  Current Meds:   Current  Outpatient Medications:  .  Adhesive Tape (PAPER TAPE 1"X10YD) TAPE, Apply to dressings as needed, Disp: 4 each, Rfl: 6 .  atorvastatin (LIPITOR) 10 MG tablet, Take 10 mg by mouth daily. , Disp: , Rfl:  .  baclofen (LIORESAL) 20 MG tablet, TAKE 1 TABLET BY MOUTH FOUR TIMES A DAY**OFFICE VISIT NEEDED**, Disp: 120 tablet, Rfl: 0 .  Catheters (DOVER UNIVERSAL FOLEY TRAY) KIT, Use foley tray kit to change catheter every 3 weeks or as needed, Disp: 15 each, Rfl: 1 .  Catheters (FOLEY CATHETER 2-WAY) MISC, Use to aspirate urine suprapubic, Disp: 15 each, Rfl: 1 .  Cholecalciferol (D3-1000) 1000 units capsule, Take 2,000 Units by mouth daily. , Disp: , Rfl:  .  Control Gel Formula Dressing (DUODERM CGF DRESSING) MISC, Apply 1 each topically daily as needed., Disp: 5 each, Rfl: 11 .  cyanocobalamin 500 MCG tablet, Take 500 mcg by mouth daily., Disp: , Rfl:  .  Disposable Gloves MISC, Use disposable gloves to change catheter as needed, Disp: 200 each, Rfl: 11 .  furosemide (LASIX) 20 MG tablet, Take 1 tablet (20 mg total) by mouth daily., Disp: 90 tablet, Rfl: 0 .  glipiZIDE (GLUCOTROL) 5 MG tablet, Take 1 tablet (5 mg total) by mouth daily before lunch., Disp: 90 tablet, Rfl: 3 .  hydrochlorothiazide (HYDRODIURIL) 25 MG tablet, TAKE 1 TABLET BY MOUTH EVERY MORNING, Disp: 90 tablet, Rfl: 0 .  Incontinence Supply Disposable (TENA FLEX 16 PLUS) MISC, Use daily as needed, Disp: 30 each, Rfl: 11 .  Incontinence Supply Disposable (TENA PROTECT UNDERWEAR PLS/XLG) MISC, Use for incontinence., Disp: 30 each, Rfl: 11 .  Interferon Beta-1b (BETASERON) 0.3 MG KIT injection, Inject 0.3 mg into the skin every other day., Disp: 36 each, Rfl: 3 .  lidocaine (XYLOCAINE) 2 % jelly, USE when need with dressings, Disp: , Rfl: 1 .  metFORMIN (GLUCOPHAGE) 1000 MG tablet, Take 1,000 mg by mouth 2 (two) times daily with a meal., Disp: , Rfl:  .  Methenamine-Sodium Salicylate (AZO URINARY TRACT DEFENSE) 162-162.5 MG TABS, Take as  instructed, Disp: , Rfl:  .  MOVANTIK 25 MG TABS tablet, Take 25 mg by mouth every morning., Disp: , Rfl: 4 .  NON FORMULARY, Power wheelchair repairs  Diagnosis code z99.3, Disp: 1 each, Rfl: 0 .  NON FORMULARY, Dispense catheter urine drainage bags, Disp: 90 Bag, Rfl: 3 .  nystatin ointment (MYCOSTATIN), Apply to affected area twice daily as needed for irritation, Disp: 30 g, Rfl: 6 .  Ostomy Supplies (NEW IMAGE SKIN/FLANGE/TAPE) MISC, 1 application by Does not apply route daily as needed., Disp: 30 each, Rfl: 11 .  oxyCODONE (OXYCONTIN) 20 mg 12 hr tablet, Take 1 tablet (20 mg total) by mouth 3 (three) times daily as needed (pain)., Disp: 90 tablet, Rfl: 0 .  oxyCODONE-acetaminophen (PERCOCET) 10-325 MG tablet, Take 1 tablet by mouth every 8 (eight) hours as needed for pain. Needs office visit., Disp: 90 tablet, Rfl: 0 .  potassium chloride (K-DUR,KLOR-CON) 10 MEQ tablet, TAKE 3 TABLETS BY MOUTH TWICE DAILY**OFFICE VISIT NEEDED FOR REFILLS**, Disp: 180 tablet, Rfl: 0 .  Syringe, Disposable, (B-D SYRINGE LUER-LOK 30CC) 30 ML MISC, Use  to change catheter, Disp: 15 each, Rfl: 0 .  Wound Dressings (GRX HYDROGEL GAUZE 4X4) PADS, Use gauze to clean wound daily as needed, Disp: 200 each, Rfl: 11   Allergy: No Known Allergies  Social Hx:   Social History   Socioeconomic History  . Marital status: Single    Spouse name: Not on file  . Number of children: Not on file  . Years of education: Not on file  . Highest education level: Not on file  Occupational History  . Not on file  Social Needs  . Financial resource strain: Not on file  . Food insecurity:    Worry: Not on file    Inability: Not on file  . Transportation needs:    Medical: Not on file    Non-medical: Not on file  Tobacco Use  . Smoking status: Never Smoker  . Smokeless tobacco: Never Used  Substance and Sexual Activity  . Alcohol use: No  . Drug use: No  . Sexual activity: Not on file  Lifestyle  . Physical activity:     Days per week: Not on file    Minutes per session: Not on file  . Stress: Not on file  Relationships  . Social connections:    Talks on phone: Not on file    Gets together: Not on file    Attends religious service: Not on file    Active member of club or organization: Not on file    Attends meetings of clubs or organizations: Not on file    Relationship status: Not on file  . Intimate partner violence:    Fear of current or ex partner: Not on file    Emotionally abused: Not on file    Physically abused: Not on file    Forced sexual activity: Not on file  Other Topics Concern  . Not on file  Social History Narrative  . Not on file    Past Surgical Hx:  Past Surgical History:  Procedure Laterality Date  . COLONOSCOPY    . ESOPHAGOGASTRODUODENOSCOPY N/A 12/02/2017   Procedure: ESOPHAGOGASTRODUODENOSCOPY (EGD);  Surgeon: Ronnette Juniper, MD;  Location: [facility];  Service: Gastroenterology;  Laterality: N/A;  . ROBOTIC ASSISTED TOTAL HYSTERECTOMY WITH BILATERAL SALPINGO OOPHERECTOMY Bilateral 12/19/2018   Procedure: XI ROBOTIC ASSISTED TOTAL HYSTERECTOMY (UTERUS GREATER THAN 250gr) WITH BILATERAL SALPINGO OOPHORECTOMY;  Surgeon: Everitt Amber, MD;  Location: [facility];  Service: Gynecology;  Laterality: Bilateral;  . SENTINEL NODE BIOPSY N/A 12/19/2018   Procedure: SENTINEL NODE BIOPSY;  Surgeon: Everitt Amber, MD;  Location: [facility];  Service: Gynecology;  Laterality: N/A;  . SUPRAPUBIC CATHETER INSERTION  2009  . TONSILLECTOMY    . TUBAL LIGATION      Past Medical Hx:  Past Medical History:  Diagnosis Date  . Anemia    history of  . Arthritis   . Decubitus ulcers    currently treated at wound center  . Diabetes mellitus without complication ([place])   . Diabetic polyneuropathy ([place])   . Endometrial cancer ([facility])    Grade 2  . Fibroids   . Heartburn    Occ  . Hypertension   . Mixed hyperlipidemia   . Morbid obesity ([place])   . MS (multiple sclerosis) ([place])     Past  Gynecological History:  See HPI No LMP recorded. Patient is postmenopausal.  Family Hx:  Family History  Problem Relation Age of Onset  . Cancer Mother   . Hypertension Mother   . Heart disease  Father   . Diabetes Father   . Hypertension Sister   . Hypertension Brother     Review of Systems:  Constitutional  Feels well,   ENT Normal appearing ears and nares bilaterally Skin/Breast  No rash, sores, jaundice, itching, dryness Cardiovascular  No chest pain, shortness of breath, or edema  Pulmonary  No cough or wheeze.  Gastro Intestinal  No nausea, vomitting, or diarrhoea. No bright red blood per rectum, no abdominal pain,+constipation.  Genito Urinary  No frequency, urgency, dysuria, no bleeding Musculo Skeletal  No myalgia, arthralgia, joint swelling or pain  Neurologic  + spastic paralysis lower extremities Psychology  No depression, anxiety, insomnia.   Vitals:  Blood pressure 137/85, pulse 76, temperature 98.1 F (36.7 C), temperature source Oral, resp. rate 18, SpO2 100 %.  Physical Exam: WD in NAD Neck  Supple NROM, without any enlargements.  Lymph Node Survey No cervical supraclavicular or inguinal adenopathy Cardiovascular  Pulse normal rate, regularity and rhythm. S1 and S2 normal.  Lungs  Clear to auscultation bilateraly, without wheezes/crackles/rhonchi. Good air movement.  Skin  No rash/lesions/breakdown  Psychiatry  Alert and oriented to person, place, and time  Abdomen  Normoactive bowel sounds, abdomen soft, non-tender and obese without evidence of hernia. Suprapubic catheter sites present. Well healed incisions. Back No CVA tenderness Genito Urinary  Vaginal cuff in tact, well healed, no bleeding, no masses palpable. Rectal  deferred Extremities  No bilateral cyanosis, clubbing or edema.   Thereasa Solo, MD  01/19/2019, 4:50 PM

## 2019-01-19 NOTE — Patient Instructions (Signed)
Dr Denman George is clearing you to receive radiation with Dr Sondra Come as he sees fit.  She will see you back for follow-up after completing therapy. Dr Clabe Seal office will set this follow-up for you after completing radiation.

## 2019-01-22 ENCOUNTER — Encounter: Payer: Self-pay | Admitting: Radiation Oncology

## 2019-01-22 ENCOUNTER — Ambulatory Visit
Admission: RE | Admit: 2019-01-22 | Discharge: 2019-01-22 | Disposition: A | Discharge status: Home / Self Care | Payer: Medicare Other | Source: Ambulatory Visit | Attending: Radiation Oncology | Admitting: Radiation Oncology

## 2019-01-22 ENCOUNTER — Other Ambulatory Visit: Payer: Self-pay

## 2019-01-22 VITALS — BP 111/64 | HR 76 | Temp 98.0°F | Resp 20

## 2019-01-22 DIAGNOSIS — Z90710 Acquired absence of both cervix and uterus: Secondary | ICD-10-CM | POA: Insufficient documentation

## 2019-01-22 DIAGNOSIS — E782 Mixed hyperlipidemia: Secondary | ICD-10-CM | POA: Insufficient documentation

## 2019-01-22 DIAGNOSIS — Z90722 Acquired absence of ovaries, bilateral: Secondary | ICD-10-CM | POA: Diagnosis not present

## 2019-01-22 DIAGNOSIS — D649 Anemia, unspecified: Secondary | ICD-10-CM | POA: Insufficient documentation

## 2019-01-22 DIAGNOSIS — Z79899 Other long term (current) drug therapy: Secondary | ICD-10-CM | POA: Diagnosis not present

## 2019-01-22 DIAGNOSIS — M1990 Unspecified osteoarthritis, unspecified site: Secondary | ICD-10-CM | POA: Diagnosis not present

## 2019-01-22 DIAGNOSIS — E119 Type 2 diabetes mellitus without complications: Secondary | ICD-10-CM | POA: Insufficient documentation

## 2019-01-22 DIAGNOSIS — E1142 Type 2 diabetes mellitus with diabetic polyneuropathy: Secondary | ICD-10-CM | POA: Insufficient documentation

## 2019-01-22 DIAGNOSIS — Z90711 Acquired absence of uterus with remaining cervical stump: Secondary | ICD-10-CM | POA: Diagnosis not present

## 2019-01-22 DIAGNOSIS — G35 Multiple sclerosis: Secondary | ICD-10-CM | POA: Diagnosis not present

## 2019-01-22 DIAGNOSIS — C541 Malignant neoplasm of endometrium: Secondary | ICD-10-CM

## 2019-01-22 DIAGNOSIS — G8222 Paraplegia, incomplete: Secondary | ICD-10-CM | POA: Diagnosis not present

## 2019-01-22 DIAGNOSIS — C775 Secondary and unspecified malignant neoplasm of intrapelvic lymph nodes: Secondary | ICD-10-CM | POA: Diagnosis not present

## 2019-01-22 NOTE — Progress Notes (Signed)
Radiation Oncology         [phone] ________________________________  Initial Outpatient Consultation  Name: [name] MRN: [id number]  Date: 01/22/2019  DOB: 11-Jun-1961  [id number], Alexandria Solomons, MD  Alexandria Amber, MD   REFERRING PHYSICIAN: Everitt Amber, MD  DIAGNOSIS: Endometrial cancer grade 2, stage IB, pT1b, pN0(i+)  HISTORY OF PRESENT ILLNESS::[name] is a 58 y.o. female who is presenting to the office today for evaluation of endometrial cancer. She first noticed vaginal bleeding in October/November 2019. She has type 2 diabetes, MS, and a suprapubic catheter. She was diagnosed with MS in 2006.  She lives with her daughter who helps her with ADLs.   She then underwent a biopsy on December 6 which showed endometrial adenocarcinoma, FIGO grade II.    On January 14, she had robotic assisted total hysterectomy (uterus greater than 250g) with bilateral salpingoophorectomy. The pathology report from this showed: isolated tumor cells in 1 lymph node. In the uterus, invasive endometrioid adenocarcinoma grade II/III, spanning 9.5 cm. Adenocarcinoma involves the outer half of the myometrium (extends 30 mm deep where the myometrium is 40 mm deep). LVSI was identified. Adenocarcinoma involves the lower uterine segment.    she denies rectal bleeding and any other symptoms.   PREVIOUS RADIATION THERAPY: No  PAST MEDICAL HISTORY:  has a past medical history of Anemia, Arthritis, Decubitus ulcers, Diabetes mellitus without complication ([place]), Diabetic polyneuropathy ([place]), Endometrial cancer ([place]), Fibroids, Heartburn, Hypertension, Mixed hyperlipidemia, Morbid obesity ([place]), and MS (multiple sclerosis) ([place]).    PAST SURGICAL HISTORY: Past Surgical History:  Procedure Laterality Date  . COLONOSCOPY    . ESOPHAGOGASTRODUODENOSCOPY N/A 12/02/2017   Procedure: ESOPHAGOGASTRODUODENOSCOPY (EGD);  Surgeon: Ronnette Juniper, MD;  Location: [facility];  Service: Gastroenterology;  Laterality: N/A;  .  ROBOTIC ASSISTED TOTAL HYSTERECTOMY WITH BILATERAL SALPINGO OOPHERECTOMY Bilateral 12/19/2018   Procedure: XI ROBOTIC ASSISTED TOTAL HYSTERECTOMY (UTERUS GREATER THAN 250gr) WITH BILATERAL SALPINGO OOPHORECTOMY;  Surgeon: Alexandria Amber, MD;  Location: [facility];  Service: Gynecology;  Laterality: Bilateral;  . SENTINEL NODE BIOPSY N/A 12/19/2018   Procedure: SENTINEL NODE BIOPSY;  Surgeon: Alexandria Amber, MD;  Location[facility];  Service: Gynecology;  Laterality: N/A;  . SUPRAPUBIC CATHETER INSERTION  2009  . TONSILLECTOMY    . TUBAL LIGATION      FAMILY HISTORY: family history includes Cancer in her mother; Diabetes in her father; Heart disease in her father; Hypertension in her brother, mother, and sister.  SOCIAL HISTORY:  reports that she has never smoked. She has never used smokeless tobacco. She reports that she does not drink alcohol or use drugs.  ALLERGIES: Patient has no known allergies.  MEDICATIONS:  Current Outpatient Medications  Medication Sig Dispense Refill  . Adhesive Tape (PAPER TAPE 1"X10YD) TAPE Apply to dressings as needed 4 each 6  . atorvastatin (LIPITOR) 10 MG tablet Take 10 mg by mouth every evening.     . baclofen (LIORESAL) 20 MG tablet TAKE 1 TABLET BY MOUTH FOUR TIMES A DAY**OFFICE VISIT NEEDED** 30 tablet 0  . Catheters (DOVER UNIVERSAL FOLEY TRAY) KIT Use foley tray kit to change catheter every 3 weeks or as needed 15 each 1  . Catheters (FOLEY CATHETER 2-WAY) MISC Use to aspirate urine suprapubic 15 each 1  . Cholecalciferol (D3-1000) 1000 units capsule Take 1,000 Units by mouth 2 (two) times daily. MORNING & AFTERNOON.    . cyanocobalamin 500 MCG tablet Take 500 mcg by mouth daily.    . Disposable Gloves MISC Use disposable gloves  to change catheter as needed 200 each 11  . furosemide (LASIX) 20 MG tablet TAKE 1 TABLET BY MOUTH ONCE DAILY 90 tablet 0  . glipiZIDE (GLUCOTROL) 5 MG tablet Take 1 tablet (5 mg total) by mouth daily before lunch. (Patient taking  differently: Take 5 mg by mouth daily. ) 90 tablet 3  . hydrochlorothiazide (HYDRODIURIL) 25 MG tablet Take 25 mg by mouth daily.    . Incontinence Supply Disposable (TENA FLEX 16 PLUS) MISC Use daily as needed 30 each 11  . Incontinence Supply Disposable (TENA PROTECT UNDERWEAR PLS/XLG) MISC Use for incontinence. 30 each 11  . Interferon Beta-1b (BETASERON) 0.3 MG KIT injection Inject 0.3 mg into the skin every other day. 36 each 3  . metFORMIN (GLUCOPHAGE) 500 MG tablet Take 500 mg by mouth 2 (two) times daily.  3  . metoCLOPramide (REGLAN) 5 MG tablet Take 1 tablet (5 mg total) by mouth 4 (four) times daily -  before meals and at bedtime. 42 tablet 0  . MOVANTIK 25 MG TABS tablet Take 25 mg by mouth every other day. IN THE MORNING.  4  . NON FORMULARY Power wheelchair repairs  Diagnosis code z99.3 1 each 0  . NON FORMULARY Dispense catheter urine drainage bags 90 Bag 3  . Ostomy Supplies (NEW IMAGE SKIN/FLANGE/TAPE) MISC 1 application by Does not apply route daily as needed. 30 each 11  . oxyCODONE (OXYCONTIN) 20 mg 12 hr tablet Take 1 tablet (20 mg total) by mouth 3 (three) times daily as needed (pain). 90 tablet 0  . oxyCODONE-acetaminophen (PERCOCET) 10-325 MG tablet Take 1 tablet by mouth every 8 (eight) hours as needed for pain. Needs office visit. 90 tablet 0  . potassium chloride (K-DUR,KLOR-CON) 10 MEQ tablet TAKE 3 TABLETS BY MOUTH TWICE DAILY**OFFICE VISIT NEEDED FOR REFILLS** 30 tablet 0  . senna-docusate (SENOKOT-S) 8.6-50 MG tablet Take 2 tablets by mouth at bedtime. Do not take if having loose stools 30 tablet 1  . Syringe, Disposable, (B-D SYRINGE LUER-LOK 30CC) 30 ML MISC Use to change catheter 15 each 0  . Wound Dressings (GRX HYDROGEL GAUZE 4X4) PADS Use gauze to clean wound daily as needed 200 each 11  . Control Gel Formula Dressing (DUODERM CGF DRESSING) MISC Apply 1 each topically daily as needed. (Patient not taking: Reported on 01/22/2019) 5 each 11  . nystatin ointment  (MYCOSTATIN) Apply to affected area twice daily as needed for irritation (Patient not taking: Reported on 01/22/2019) 30 g 6  . sulfamethoxazole-trimethoprim (BACTRIM DS,SEPTRA DS) 800-160 MG tablet Take 1 tablet by mouth 2 (two) times daily. (Patient not taking: Reported on 01/22/2019) 14 tablet 0   No current facility-administered medications for this encounter.     REVIEW OF SYSTEMS:  A 10+ POINT REVIEW OF SYSTEMS WAS OBTAINED including neurology, dermatology, psychiatry, cardiac, respiratory, lymph, extremities, GI, GU, musculoskeletal, constitutional, reproductive, HEENT. All pertinent positives are noted in the HPI. All others are negative.    PHYSICAL EXAM:  oral temperature is 98 F (36.7 C). Her blood pressure is 111/64 and her pulse is 76. Her respiration is 20 and oxygen saturation is 100%.   Lungs are clear to auscultation bilaterally. Heart has regular rate and rhythm. No palpable cervical, supraclavicular, or axillary adenopathy. Abdomen soft, non-tender, normal bowel sounds. She is essentially paralyzed from the wait down from [place]. She uses a motorized wheelchair. Pelvic exam deferred until simulation and planning day.    ECOG = 3  0 - Asymptomatic (Fully active,  able to carry on all predisease activities without restriction)  1 - Symptomatic but completely ambulatory (Restricted in physically strenuous activity but ambulatory and able to carry out work of a light or sedentary nature. For example, light housework, office work)  2 - Symptomatic, <50% in bed during the day (Ambulatory and capable of all self care but unable to carry out any work activities. Up and about more than 50% of waking hours)  3 - Symptomatic, >50% in bed, but not bedbound (Capable of only limited self-care, confined to bed or chair 50% or more of waking hours)  4 - Bedbound (Completely disabled. Cannot carry on any self-care. Totally confined to bed or chair)  5 - Death   Eustace Pen MM, Creech RH, Tormey DC,  et al. [phone]). "Toxicity and response criteria of the [facility]". [place] Oncol. 5 (6): 649-55  LABORATORY DATA:  Lab Results  Component Value Date   WBC 11.6 (H) 12/20/2018   HGB 9.2 (L) 12/20/2018   HCT 30.5 (L) 12/20/2018   MCV 85.7 12/20/2018   PLT 374 12/20/2018   NEUTROABS 2.6 12/15/2016   Lab Results  Component Value Date   NA 137 12/20/2018   K 4.7 12/20/2018   CL 103 12/20/2018   CO2 24 12/20/2018   GLUCOSE 169 (H) 12/20/2018   CREATININE 1.20 (H) 12/20/2018   CALCIUM 8.2 (L) 12/20/2018      RADIOGRAPHY: No results found.    IMPRESSION: Endometrial cancer grade 2, stage IB, pT1b, pN0(i+). Pt was found to have a deeply invasive tumor with lower uterine segment involvement as well as LVSI. She was also found to have metastatic spread in one of her sentinel lymph nodes. Given that these findings, the pt would be at risk for recurrence in the pelvis and vaginal cuff area, and would agree with Dr. Serita Grit recommendations for post-operative adjuvant treatment, including external beam radiation and intracavitary brachytherapy.  Treatments will be difficult for the patient given her significant comorbidities including multiple sclerosis, she is wheelchair bound and transfers with [facility] lift. She also has a suprapubic catheter has no function in her lower extremities with history of decubitus ulcers.  Today, I talked to the patient about the findings and work-up thus far.  We discussed the natural history of endometrial cancer and general treatment, highlighting the role of radiotherapy in the management.  We discussed the available radiation techniques, and focused on the details of logistics and delivery.  We reviewed the anticipated acute and late sequelae associated with radiation in this setting.  The patient was encouraged to ask questions that I answered to the best of my ability.  A patient consent form was discussed and signed.  We retained a copy  for our records.  The patient would like to proceed with radiation and will be scheduled for CT simulation.   PLAN: She will return for CT simulation on March 2 with treatments to begin a week later. Anticipate 5 weeks of external beam radiation therapy and 3 intracavitary radiation treatments.     ------------------------------------------------  Blair Promise, PhD, MD    This document serves as a record of services personally performed by Gery Pray, MD. It was created on his behalf by Mary-Margaret Loma Messing, a trained medical scribe. The creation of this record is based on the scribe's personal observations and the provider's statements to them. This document has been checked and approved by the attending provider.

## 2019-01-24 ENCOUNTER — Ambulatory Visit: Payer: Medicare Other | Admitting: Family Medicine

## 2019-01-24 ENCOUNTER — Ambulatory Visit: Payer: Medicare Other | Admitting: Podiatry

## 2019-01-24 ENCOUNTER — Other Ambulatory Visit: Payer: Self-pay

## 2019-01-24 ENCOUNTER — Ambulatory Visit (INDEPENDENT_AMBULATORY_CARE_PROVIDER_SITE_OTHER): Payer: Medicare Other | Admitting: Family Medicine

## 2019-01-24 ENCOUNTER — Encounter: Payer: Self-pay | Admitting: Family Medicine

## 2019-01-24 VITALS — BP 140/77 | HR 66 | Temp 97.7°F | Ht 67.0 in | Wt 300.0 lb

## 2019-01-24 DIAGNOSIS — G35 Multiple sclerosis: Secondary | ICD-10-CM

## 2019-01-24 DIAGNOSIS — K5909 Other constipation: Secondary | ICD-10-CM | POA: Diagnosis not present

## 2019-01-24 DIAGNOSIS — C541 Malignant neoplasm of endometrium: Secondary | ICD-10-CM | POA: Diagnosis not present

## 2019-01-24 DIAGNOSIS — Z79891 Long term (current) use of opiate analgesic: Secondary | ICD-10-CM | POA: Diagnosis not present

## 2019-01-24 MED ORDER — OXYCODONE-ACETAMINOPHEN 10-325 MG PO TABS: 1.0000 | ORAL_TABLET | Freq: Three times a day (TID) | ORAL | 0 refills | Status: DC | PRN

## 2019-01-24 NOTE — Patient Instructions (Signed)
° ° ° °  If you have lab work done today you will be contacted with your lab results within the next 2 weeks.  If you have not heard from us then please contact us. The fastest way to get your results is to register for My Chart. ° ° °IF you received an x-ray today, you will receive an invoice from [facility]. Please contact [facility] at [phone] with questions or concerns regarding your invoice.  ° °IF you received labwork today, you will receive an invoice from [facility]. Please contact [facility] at [phone] with questions or concerns regarding your invoice.  ° °Our billing staff will not be able to assist you with questions regarding bills from these companies. ° °You will be contacted with the lab results as soon as they are available. The fastest way to get your results is to activate your My Chart account. Instructions are located on the last page of this paperwork. If you have not heard from us regarding the results in 2 weeks, please contact this office. °  ° ° ° °

## 2019-01-24 NOTE — Progress Notes (Signed)
Established Patient Office Visit  Subjective:  Patient ID: [name], female    DOB: February 24, 1961  Age: 58 y.o. MRN: [id number]  CC:  Chief Complaint  Patient presents with  . opiate therapy    6 mo f/u on chronic opiate therapy. Last sesen 07/19/18. Need a refill on Percocet 10-333m    HPI [name] for  Endometrial Cancer Patient is an established patient at this practice who presents for follow-up for her opiate medication management.  Since her last visit she has had an eventful several months.  She was diagnosed with endometrial cancer stage I after abnormal biopsy.  She is now status post robotic assisted hysterectomy and bilateral salpingo-oophorectomy.  She has met with her radiation oncologist and the plan is for 5 weeks of external radiation Monday to Friday as well as 3 weeks of internal radiation once a week.  She reports that she still cannot believe after all the health concern she has that she ended up with cancer.  She is worried about how she will manage to transfer from the bed to the wheelchair back into a vehicle and back into the clinic to only [facility]  She is already feeling a little stressed about all the fatigue this will cause.  She reports that her daughter who is her primary caregiver will need accommodations for her work.  She has an upcoming appointment with her urologist as she has a chronic indwelling Foley catheter. She is concerned about how the potential risk of radiation to her bladder.  Chronic Constipation Patient also reports that she continues to have chronic constipation but had a very poor experience with her last GI doctor.  [place] and the fact that she has opiates on board which caused her constipation as well as her multiple sclerosis which affects her bowel movements.  She states that she would like to meet with a new  gastroenterologist. 5 weeks of external radiation M-F 3 weeks of internal radiation once a week  Past Medical History:  Diagnosis Date  . Anemia    history of  . Arthritis   . Decubitus ulcers    currently treated at wound center  . Diabetes [place] ([place]   . Diabetic polyneuropathy ([place]   . Endometrial cancer ([facility])    Grade 2  . Fibroids   . Heartburn    Occ  . Hypertension   . Mixed hyperlipidemia   . Morbid obesity ([place]   . MS (multiple sclerosis) ([place]     Past Surgical History:  Procedure Laterality Date  . COLONOSCOPY    . ESOPHAGOGASTRODUODENOSCOPY N/A 12/02/2017   Procedure: ESOPHAGOGASTRODUODENOSCOPY (EGD);  Surgeon: KRonnette Juniper MD;  [facility]  Service: Gastroenterology;  Laterality: N/A;  . ROBOTIC ASSISTED TOTAL HYSTERECTOMY WITH BILATERAL SALPINGO OOPHERECTOMY Bilateral 12/19/2018   Procedure: XI ROBOTIC ASSISTED TOTAL HYSTERECTOMY (UTERUS GREATER THAN 250gr) WITH BILATERAL SALPINGO OOPHORECTOMY;  Surgeon: REveritt Amber MD;  [facility];  Service: Gynecology;  Laterality: Bilateral;  . SENTINEL NODE BIOPSY N/A 12/19/2018   Procedure: SENTINEL NODE BIOPSY;  Surgeon: REveritt Amber MD;  [facility];  Service: Gynecology;  Laterality: N/A;  . SUPRAPUBIC CATHETER INSERTION  2009  . TONSILLECTOMY    . TUBAL LIGATION      Family History  Problem Relation Age of Onset  . Cancer Mother   . Hypertension  Mother   . Heart disease Father   . Diabetes Father   . Hypertension Sister   . Hypertension Brother     Social History   Socioeconomic History  . Marital status: Single    Spouse name: Not on file  . Number of children: Not on file  . Years of education: Not on file  . Highest education level: Not on file  Occupational History  . Not on file  Social Needs  . Financial resource strain: Not on file  . Food insecurity:    Worry: Not on file    Inability: Not on file  . Transportation needs:    Medical: Not on  file    Non-medical: Not on file  Tobacco Use  . Smoking status: Never Smoker  . Smokeless tobacco: Never Used  Substance and Sexual Activity  . Alcohol use: No  . Drug use: No  . Sexual activity: Not on file  Lifestyle  . Physical activity:    Days per week: Not on file    Minutes per session: Not on file  . Stress: Not on file  Relationships  . Social connections:    Talks on phone: Not on file    Gets together: Not on file    Attends religious service: Not on file    Active member of club or organization: Not on file    Attends meetings of clubs or organizations: Not on file    Relationship status: Not on file  . Intimate partner violence:    Fear of current or ex partner: Not on file    Emotionally abused: Not on file    Physically abused: Not on file    Forced sexual activity: Not on file  Other Topics Concern  . Not on file  Social History Narrative  . Not on file    Outpatient Medications Prior to Visit  Medication Sig Dispense Refill  . Adhesive Tape (PAPER TAPE 1"X10YD) TAPE Apply to dressings as needed 4 each 6  . atorvastatin (LIPITOR) 10 MG tablet Take 10 mg by mouth every evening.     . baclofen (LIORESAL) 20 MG tablet TAKE 1 TABLET BY MOUTH FOUR TIMES A DAY**OFFICE VISIT NEEDED** 30 tablet 0  . Catheters (DOVER UNIVERSAL FOLEY TRAY) KIT Use foley tray kit to change catheter every 3 weeks or as needed 15 each 1  . Catheters (FOLEY CATHETER 2-WAY) MISC Use to aspirate urine suprapubic 15 each 1  . Cholecalciferol (D3-1000) 1000 units capsule Take 1,000 Units by mouth 2 (two) times daily. MORNING & AFTERNOON.    . Control Gel Formula Dressing (DUODERM CGF DRESSING) MISC Apply 1 each topically daily as needed. 5 each 11  . cyanocobalamin 500 MCG tablet Take 500 mcg by mouth daily.    . Disposable Gloves MISC Use disposable gloves to change catheter as needed 200 each 11  . furosemide (LASIX) 20 MG tablet TAKE 1 TABLET BY MOUTH ONCE DAILY 90 tablet 0  . glipiZIDE  (GLUCOTROL) 5 MG tablet Take 1 tablet (5 mg total) by mouth daily before lunch. (Patient taking differently: Take 5 mg by mouth daily. ) 90 tablet 3  . hydrochlorothiazide (HYDRODIURIL) 25 MG tablet Take 25 mg by mouth daily.    . Incontinence Supply Disposable (TENA FLEX 16 PLUS) MISC Use daily as needed 30 each 11  . Incontinence Supply Disposable (TENA PROTECT UNDERWEAR PLS/XLG) MISC Use for incontinence. 30 each 11  . Interferon Beta-1b (BETASERON) 0.3 MG KIT injection Inject  0.3 mg into the skin every other day. 36 each 3  . metFORMIN (GLUCOPHAGE) 500 MG tablet Take 500 mg by mouth 2 (two) times daily.  3  . metoCLOPramide (REGLAN) 5 MG tablet Take 1 tablet (5 mg total) by mouth 4 (four) times daily -  before meals and at bedtime. 42 tablet 0  . MOVANTIK 25 MG TABS tablet Take 25 mg by mouth every other day. IN THE MORNING.  4  . NON FORMULARY Power wheelchair repairs  Diagnosis code z99.3 1 each 0  . NON FORMULARY Dispense catheter urine drainage bags 90 Bag 3  . nystatin ointment (MYCOSTATIN) Apply to affected area twice daily as needed for irritation 30 g 6  . Ostomy Supplies (NEW IMAGE SKIN/FLANGE/TAPE) MISC 1 application by Does not apply route daily as needed. 30 each 11  . potassium chloride (K-DUR,KLOR-CON) 10 MEQ tablet TAKE 3 TABLETS BY MOUTH TWICE DAILY**OFFICE VISIT NEEDED FOR REFILLS** 30 tablet 0  . senna-docusate (SENOKOT-S) 8.6-50 MG tablet Take 2 tablets by mouth at bedtime. Do not take if having loose stools 30 tablet 1  . sulfamethoxazole-trimethoprim (BACTRIM DS,SEPTRA DS) 800-160 MG tablet Take 1 tablet by mouth 2 (two) times daily. 14 tablet 0  . Syringe, Disposable, (B-D SYRINGE LUER-LOK 30CC) 30 ML MISC Use to change catheter 15 each 0  . Wound Dressings (GRX HYDROGEL GAUZE 4X4) PADS Use gauze to clean wound daily as needed 200 each 11  . oxyCODONE-acetaminophen (PERCOCET) 10-325 MG tablet Take 1 tablet by mouth every 8 (eight) hours as needed for pain. Needs office  visit. 90 tablet 0  . oxyCODONE (OXYCONTIN) 20 mg 12 hr tablet Take 1 tablet (20 mg total) by mouth 3 (three) times daily as needed (pain). 90 tablet 0   No facility-administered medications prior to visit.     No Known Allergies  ROS Review of Systems    Objective:    Physical Exam  There were no vitals taken for this visit. Wt Readings from Last 3 Encounters:  12/19/18 290 lb 14.4 oz (132 kg)  11/10/18 200 lb (90.7 kg)  07/19/18 220 lb (99.8 kg)   Physical Exam  Constitutional: Oriented to person, place, and time. Appears well-developed and well-nourished.  Wheelchair-bound HENT:  Head: Normocephalic and atraumatic.  Eyes: Conjunctivae and EOM are normal.  Cardiovascular: Normal rate, regular rhythm, normal heart sounds and intact distal pulses.  No murmur heard. Pulmonary/Chest: Effort normal and breath sounds normal. No stridor. No respiratory distress. Has no wheezes.  Neurological: Is alert and oriented to person, place, and time.  Skin: Skin is warm. Capillary refill takes less than 2 seconds.  Psychiatric: Has a normal mood and affect. Behavior is normal. Judgment and thought content normal.    Health Maintenance Due  Topic Date Due  . OPHTHALMOLOGY EXAM  01/09/1970  . HIV Screening  01/09/1975    There are no preventive care reminders to display for this patient.  Lab Results  Component Value Date   TSH 0.134 (L) 07/19/2018   Lab Results  Component Value Date   WBC 11.6 (H) 12/20/2018   HGB 9.2 (L) 12/20/2018   HCT 30.5 (L) 12/20/2018   MCV 85.7 12/20/2018   PLT 374 12/20/2018   Lab Results  Component Value Date   NA 137 12/20/2018   K 4.7 12/20/2018   CO2 24 12/20/2018   GLUCOSE 169 (H) 12/20/2018   BUN 16 12/20/2018   CREATININE 1.20 (H) 12/20/2018   BILITOT 0.6 12/12/2018     ALKPHOS 64 12/12/2018   AST 13 (L) 12/12/2018   ALT 12 12/12/2018   PROT 7.7 12/12/2018   ALBUMIN 4.2 12/12/2018   CALCIUM 8.2 (L) 12/20/2018   ANIONGAP 10  12/20/2018   Lab Results  Component Value Date   CHOL 130 07/19/2018   Lab Results  Component Value Date   HDL 64 07/19/2018   Lab Results  Component Value Date   LDLCALC 52 07/19/2018   Lab Results  Component Value Date   TRIG 69 07/19/2018   Lab Results  Component Value Date   CHOLHDL 2.0 07/19/2018   Lab Results  Component Value Date   HGBA1C 7.0 (H) 12/12/2018      Assessment & Plan:   Problem List Items Addressed This Visit      Nervous and Auditory   Multiple sclerosis ([place]) (Chronic)  - discussed that she should continue with Urology and her other specialist, refilled her pain med   Relevant Medications   oxyCODONE-acetaminophen (PERCOCET) 10-325 MG tablet (Start on 02/21/2019)     Other   Chronically on opiate therapy - Primary (Chronic)  - PMP reviewed and appropriate. Also refilled March to be stored on file   Relevant Medications   oxyCODONE-acetaminophen (PERCOCET) 10-325 MG tablet (Start on 02/21/2019)    Other Visit Diagnoses    Chronic constipation    -  Referral placed for GI with Dr. Collene Mares who would be a better match for this patient who neds someone with a good beside manner   Relevant Orders   Ambulatory referral to Gastroenterology   Endometrial cancer, grade I ([place])    -  Will follow up with patient for what needs she has and get FMLA form completed for her daughter      Meds ordered this encounter  Medications  . oxyCODONE-acetaminophen (PERCOCET) 10-325 MG tablet    Sig: Take 1 tablet by mouth every 8 (eight) hours as needed for pain. Needs office visit.    Dispense:  90 tablet    Refill:  0    Store on file. Fill date will be 02/21/2019.    Follow-up: No follow-ups on file.    Forrest Moron, MD

## 2019-01-26 ENCOUNTER — Encounter (HOSPITAL_BASED_OUTPATIENT_CLINIC_OR_DEPARTMENT_OTHER): Payer: Medicare Other | Attending: Internal Medicine

## 2019-01-26 DIAGNOSIS — N318 Other neuromuscular dysfunction of bladder: Secondary | ICD-10-CM | POA: Diagnosis not present

## 2019-01-29 ENCOUNTER — Ambulatory Visit: Payer: Medicare Other | Admitting: Radiation Oncology

## 2019-01-30 ENCOUNTER — Ambulatory Visit
Admission: RE | Admit: 2019-01-30 | Discharge: 2019-01-30 | Disposition: A | Discharge status: Home / Self Care | Payer: Medicare Other | Source: Ambulatory Visit | Attending: Obstetrics and Gynecology | Admitting: Obstetrics and Gynecology

## 2019-01-30 DIAGNOSIS — Z01411 Encounter for gynecological examination (general) (routine) with abnormal findings: Secondary | ICD-10-CM

## 2019-01-30 DIAGNOSIS — Z1231 Encounter for screening mammogram for malignant neoplasm of breast: Secondary | ICD-10-CM | POA: Diagnosis not present

## 2019-01-31 ENCOUNTER — Ambulatory Visit (INDEPENDENT_AMBULATORY_CARE_PROVIDER_SITE_OTHER): Payer: Medicare Other | Admitting: Podiatry

## 2019-01-31 ENCOUNTER — Encounter: Payer: Self-pay | Admitting: Podiatry

## 2019-01-31 DIAGNOSIS — G35 Multiple sclerosis: Secondary | ICD-10-CM

## 2019-01-31 DIAGNOSIS — B351 Tinea unguium: Secondary | ICD-10-CM

## 2019-01-31 DIAGNOSIS — E1142 Type 2 diabetes mellitus with diabetic polyneuropathy: Secondary | ICD-10-CM

## 2019-01-31 NOTE — Patient Instructions (Signed)
Diabetic Neuropathy Diabetic neuropathy refers to nerve damage that is caused by diabetes (diabetes mellitus). Over time, people with diabetes can develop nerve damage throughout the body. There are several types of diabetic neuropathy:  Peripheral neuropathy. This is the most common type of diabetic neuropathy. It causes damage to nerves that carry signals between the spinal cord and other parts of the body (peripheral nerves). This usually affects nerves in the feet and legs first, and may eventually affect the hands and arms. The damage affects the ability to sense touch or temperature.  Autonomic neuropathy. This type causes damage to nerves that control involuntary functions (autonomic nerves). These nerves carry signals that control: ? Heartbeat. ? Body temperature. ? Blood pressure. ? Urination. ? Digestion. ? Sweating. ? Sexual function. ? Response to changing blood sugar (glucose) levels.  Focal neuropathy. This type of nerve damage affects one area of the body, such as an arm, a leg, or the face. The injury may involve one nerve or a small group of nerves. Focal neuropathy can be painful and unpredictable, and occurs most often in older adults with diabetes. This often develops suddenly, but usually improves over time and does not cause long-term problems.  Proximal neuropathy. This type of nerve damage affects the nerves of the thighs, hips, buttocks, or legs. It causes severe pain, weakness, and muscle death (atrophy), usually in the thigh muscles. It is more common among older men and people who have type 2 diabetes. The length of recovery time may vary. What are the causes? Peripheral, autonomic, and focal neuropathies are caused by diabetes that is not well controlled with treatment. The cause of proximal neuropathy is not known, but it may be caused by inflammation related to uncontrolled blood glucose levels. What are the signs or symptoms? Peripheral neuropathy Peripheral  neuropathy develops slowly over time. When the nerves of the feet and legs no longer work, you may experience:  Burning, stabbing, or aching pain in the legs or feet.  Pain or cramping in the legs or feet.  Loss of feeling (numbness) and inability to feel pressure or pain in the feet. This can lead to: ? Thick calluses or sores on areas of constant pressure. ? Ulcers. ? Reduced ability to feel temperature changes.  Foot deformities.  Muscle weakness.  Loss of balance or coordination. Autonomic neuropathy The symptoms of autonomic neuropathy vary depending on which nerves are affected. Symptoms may include:  Problems with digestion, such as: ? Nausea or vomiting. ? Poor appetite. ? Bloating. ? Diarrhea or constipation. ? Trouble swallowing. ? Losing weight without trying to.  Problems with the heart, blood and lungs, such as: ? Dizziness, especially when standing up. ? Fainting. ? Shortness of breath. ? Irregular heartbeat.  Bladder problems, such as: ? Trouble starting or stopping urination. ? Leaking urine. ? Trouble emptying the bladder. ? Urinary tract infections (UTIs).  Problems with other body functions, such as: ? Sweat. You may sweat too much or too little. ? Temperature. You might get hot easily. Or, you might feel cold more than usual. ? Sexual function. Men may not be able to get or maintain an erection. Women may have vaginal dryness and difficulty with arousal. Focal neuropathy Symptoms affect only one area of the body. Common symptoms include:  Numbness.  Tingling.  Burning pain.  Prickling feeling.  Very sensitive skin.  Weakness.  Inability to move (paralysis).  Muscle twitching.  Muscles getting smaller (wasting).  Poor coordination.  Double or blurred vision. Proximal   neuropathy  Sudden, severe pain in the hip, thigh, or buttocks. Pain may spread from the back into the legs (sciatica).  Pain and numbness in the arms and  legs.  Tingling.  Loss of bladder control or bowel control.  Weakness and wasting of thigh muscles.  Difficulty getting up from a seated position.  Abdominal swelling.  Unexplained weight loss. How is this diagnosed? Diagnosis usually involves reviewing your medical history and any symptoms you have. Diagnosis varies depending on the type of neuropathy your health care provider suspects. Peripheral neuropathy Your health care provider will check areas that are affected by your nervous system (neurologic exam), such as your reflexes, how you move, and what you can feel. You may have other tests, such as:  Blood tests.  Removal and examination of fluid that surrounds the spinal cord (lumbar puncture).  CT scan.  MRI.  A test to check the nerves that control muscles (electromyogram, EMG).  Tests of how quickly messages pass through your nerves (nerve conduction velocity tests).  Removal of a small piece of nerve to be examined under a microscope (biopsy). Autonomic neuropathy You may have tests, such as:  Tests to measure your blood pressure and heart rate. This may include monitoring you while you are safely secured to an exam table that moves you from a lying position to an upright position (table tilt test).  Breathing tests to check your lungs.  Tests to check how food moves through the digestive system (gastric emptying tests).  Blood, sweat, or urine tests.  Ultrasound of your bladder.  Spinal fluid tests. Focal neuropathy This condition may be diagnosed with:  A neurologic exam.  CT scan.  MRI.  EMG.  Nerve conduction velocity tests. Proximal neuropathy There is no test to diagnose this type of neuropathy. You may have tests to rule out other possible causes of this type of neuropathy. Tests may include:  X-rays of your spine and lumbar region.  Lumbar puncture.  MRI. How is this treated? The goal of treatment is to keep nerve damage from getting  worse. The most important part of treatment is keeping your blood glucose level and your A1C level within your target range by following your diabetes management plan. Over time, maintaining lower blood glucose levels helps lessen symptoms. In some cases, you may need prescription pain medicine. Follow these instructions at home:  Lifestyle   Do not use any products that contain nicotine or tobacco, such as cigarettes and e-cigarettes. If you need help quitting, ask your health care provider.  Be physically active every day. Include strength training and balance exercises.  Follow a healthy meal plan.  Work with your health care provider to manage your blood pressure. General instructions  Follow your diabetes management plan as directed. ? Check your blood glucose levels as directed by your health care provider. ? Keep your blood glucose in your target range as directed by your health care provider. ? Have your A1C level checked at least two times a year, or as often as told by your health care provider.  Take over the counter and prescription medicines only as told by your health care provider. This includes insulin and diabetes medicine.  Do not drive or use heavy machinery while taking prescription pain medicines.  Check your skin and feet every day for cuts, bruises, redness, blisters, or sores.  Keep all follow up visits as told by your health care provider. This is important. Contact a health care provider if:  by your health care provider.  ? Keep your blood glucose in your target range as directed by your health care provider.  ? Have your A1C level checked at least two times a year, or as often as told by your health care provider.  · Take over the counter and prescription medicines only as told by your health care provider. This includes insulin and diabetes medicine.  · Do not drive or use heavy machinery while taking prescription pain medicines.  · Check your skin and feet every day for cuts, bruises, redness, blisters, or sores.  · Keep all follow up visits as told by your health care provider. This is important.  Contact a health care provider if:  · You have burning, stabbing, or aching pain in your legs or feet.  · You are unable to feel pressure or pain in your feet.  · You develop problems with digestion, such as:  ? Nausea.  ? Vomiting.  ? Bloating.  ? Constipation.  ? Diarrhea.  ? Abdominal pain.  · You have difficulty with urination, such as inability:  ? To control when you urinate (incontinence).  ? To completely empty the bladder (retention).  · You have palpitations.  · You feel dizzy, weak, or faint when you  stand up.  Get help right away if:  · You cannot urinate.  · You have sudden weakness or loss of coordination.  · You have trouble speaking.  · You have pain or pressure in your chest.  · You have an irregular heart beat.  · You have sudden inability to move a part of your body.  Summary  · Diabetic neuropathy refers to nerve damage that is caused by diabetes. It can affect nerves throughout the entire body, causing numbness and pain in the arms, legs, digestive tract, heart, and other body systems.  · Keep your blood glucose level and your blood pressure in your target range, as directed by your health care provider. This can help prevent neuropathy from getting worse.  · Check your skin and feet every day for cuts, bruises, redness, blisters, or sores.  · Do not use any products that contain nicotine or tobacco, such as cigarettes and e-cigarettes. If you need help quitting, ask your health care provider.  This information is not intended to replace advice given to you by your health care provider. Make sure you discuss any questions you have with your health care provider.  Document Released: 01/31/2002 Document Revised: 01/04/2018 Document Reviewed: 12/27/2016  Elsevier Interactive Patient Education © 2019 [employer]

## 2019-02-05 ENCOUNTER — Ambulatory Visit
Admission: RE | Admit: 2019-02-05 | Discharge: 2019-02-05 | Disposition: A | Discharge status: Home / Self Care | Payer: Medicare Other | Source: Ambulatory Visit | Attending: Radiation Oncology | Admitting: Radiation Oncology

## 2019-02-05 DIAGNOSIS — M1990 Unspecified osteoarthritis, unspecified site: Secondary | ICD-10-CM | POA: Diagnosis not present

## 2019-02-05 DIAGNOSIS — Z90710 Acquired absence of both cervix and uterus: Secondary | ICD-10-CM | POA: Insufficient documentation

## 2019-02-05 DIAGNOSIS — D649 Anemia, unspecified: Secondary | ICD-10-CM | POA: Insufficient documentation

## 2019-02-05 DIAGNOSIS — E119 Type 2 diabetes mellitus without complications: Secondary | ICD-10-CM | POA: Insufficient documentation

## 2019-02-05 DIAGNOSIS — E1142 Type 2 diabetes mellitus with diabetic polyneuropathy: Secondary | ICD-10-CM | POA: Insufficient documentation

## 2019-02-05 DIAGNOSIS — Z90722 Acquired absence of ovaries, bilateral: Secondary | ICD-10-CM | POA: Diagnosis not present

## 2019-02-05 DIAGNOSIS — E782 Mixed hyperlipidemia: Secondary | ICD-10-CM | POA: Insufficient documentation

## 2019-02-05 DIAGNOSIS — Z79899 Other long term (current) drug therapy: Secondary | ICD-10-CM | POA: Insufficient documentation

## 2019-02-05 DIAGNOSIS — G35 Multiple sclerosis: Secondary | ICD-10-CM | POA: Insufficient documentation

## 2019-02-05 DIAGNOSIS — C541 Malignant neoplasm of endometrium: Secondary | ICD-10-CM | POA: Insufficient documentation

## 2019-02-05 NOTE — Progress Notes (Signed)
  Radiation Oncology         [phone] ________________________________  Name: [name] MRN: [id number]  Date: 02/05/2019  DOB: November 21, 1961  SIMULATION AND TREATMENT PLANNING NOTE    ICD-10-CM   1. Endometrial cancer ([place]) C54.1     DIAGNOSIS:  Endometrial cancer grade 2, stage IB, pT1b, pN0(i+)  NARRATIVE:  The patient was brought to the [facility].  Identity was confirmed.  All relevant records and images related to the planned course of therapy were reviewed.  The patient freely provided informed written consent to proceed with treatment after reviewing the details related to the planned course of therapy. The consent form was witnessed and verified by the simulation staff.  Then, the patient was set-up in a stable reproducible  supine position for radiation therapy.  CT images were obtained.  Surface markings were placed.  The CT images were loaded into the planning software.  Then the target and avoidance structures were contoured.  Treatment planning then occurred.  The radiation prescription was entered and confirmed.  Then, I designed and supervised the construction of a total of 2 medically necessary complex treatment devices.  I have requested : Intensity Modulated Radiotherapy (IMRT) is medically necessary for this case for the following reason:  Small bowel sparing..  I have ordered:dose calc.  PLAN:  The patient will receive 45 Gy in 25 fractions directed at the pelvis region. The patient will then proceed with 3 intracavitary therapy treatments directed at the vaginal cuff. Iridium 192 will be the high-dose-rate source.  -----------------------------------  Blair Promise, PhD, MD  This document serves as a record of services personally performed by Gery Pray, MD. It was created on his behalf by Mary-Margaret Loma Messing, a trained medical scribe. The creation of this record is based on the scribe's personal observations and the provider's statements to them.  This document has been checked and approved by the attending provider.

## 2019-02-06 NOTE — Progress Notes (Signed)
Subjective: [name] presents to clinic today for preventative diabetic foot care.  She has history of diabetic neuropathy as well as multiple sclerosis.  She is seen for management of mycotic toenails and callus plantar aspect of right foot.   She voices no new pedal concerns on today's visit.  Forrest Moron, MD is her PCP. Last visit January 24, 2019.   Current Outpatient Medications:  .  Adhesive Tape (PAPER TAPE 1"[id number]) TAPE, Apply to dressings as needed, Disp: 4 each, Rfl: 6 .  atorvastatin (LIPITOR) 10 MG tablet, Take 10 mg by mouth every evening. , Disp: , Rfl:  .  baclofen (LIORESAL) 20 MG tablet, TAKE 1 TABLET BY MOUTH FOUR TIMES A DAY**OFFICE VISIT NEEDED**, Disp: 30 tablet, Rfl: 0 .  Catheters (DOVER UNIVERSAL FOLEY TRAY) KIT, Use foley tray kit to change catheter every 3 weeks or as needed, Disp: 15 each, Rfl: 1 .  Catheters (FOLEY CATHETER 2-WAY) MISC, Use to aspirate urine suprapubic, Disp: 15 each, Rfl: 1 .  Cholecalciferol (D3-1000) 1000 units capsule, Take 1,000 Units by mouth 2 (two) times daily. MORNING & AFTERNOON., Disp: , Rfl:  .  Control Gel Formula Dressing (DUODERM CGF DRESSING) MISC, Apply 1 each topically daily as needed., Disp: 5 each, Rfl: 11 .  cyanocobalamin 500 MCG tablet, Take 500 mcg by mouth daily., Disp: , Rfl:  .  Disposable Gloves MISC, Use disposable gloves to change catheter as needed, Disp: 200 each, Rfl: 11 .  furosemide (LASIX) 20 MG tablet, TAKE 1 TABLET BY MOUTH ONCE DAILY, Disp: 90 tablet, Rfl: 0 .  glipiZIDE (GLUCOTROL) 5 MG tablet, Take 1 tablet (5 mg total) by mouth daily before lunch. (Patient taking differently: Take 5 mg by mouth daily. ), Disp: 90 tablet, Rfl: 3 .  hydrochlorothiazide (HYDRODIURIL) 25 MG tablet, Take 25 mg by mouth daily., Disp: , Rfl:  .  Incontinence Supply Disposable (TENA FLEX 16 PLUS) MISC, Use daily as needed, Disp: 30 each, Rfl: 11 .  Incontinence Supply Disposable (TENA PROTECT UNDERWEAR PLS/XLG) MISC, Use for  incontinence., Disp: 30 each, Rfl: 11 .  Interferon Beta-1b (BETASERON) 0.3 MG KIT injection, Inject 0.3 mg into the skin every other day., Disp: 36 each, Rfl: 3 .  metFORMIN (GLUCOPHAGE) 500 MG tablet, Take 500 mg by mouth 2 (two) times daily., Disp: , Rfl: 3 .  metoCLOPramide (REGLAN) 5 MG tablet, Take 1 tablet (5 mg total) by mouth 4 (four) times daily -  before meals and at bedtime., Disp: 42 tablet, Rfl: 0 .  MOVANTIK 25 MG TABS tablet, Take 25 mg by mouth every other day. IN THE MORNING., Disp: , Rfl: 4 .  NON FORMULARY, Power wheelchair repairs  Diagnosis code z99.3, Disp: 1 each, Rfl: 0 .  NON FORMULARY, Dispense catheter urine drainage bags, Disp: 90 Bag, Rfl: 3 .  nystatin ointment (MYCOSTATIN), Apply to affected area twice daily as needed for irritation, Disp: 30 g, Rfl: 6 .  Ostomy Supplies (NEW IMAGE SKIN/FLANGE/TAPE) MISC, 1 application by Does not apply route daily as needed., Disp: 30 each, Rfl: 11 .  [START ON 02/21/2019] oxyCODONE-acetaminophen (PERCOCET) 10-325 MG tablet, Take 1 tablet by mouth every 8 (eight) hours as needed for pain. Needs office visit., Disp: 90 tablet, Rfl: 0 .  potassium chloride (K-DUR,KLOR-CON) 10 MEQ tablet, TAKE 3 TABLETS BY MOUTH TWICE DAILY**OFFICE VISIT NEEDED FOR REFILLS**, Disp: 30 tablet, Rfl: 0 .  senna-docusate (SENOKOT-S) 8.6-50 MG tablet, Take 2 tablets by mouth at bedtime. Do not take  if having loose stools, Disp: 30 tablet, Rfl: 1 .  sulfamethoxazole-trimethoprim (BACTRIM DS,SEPTRA DS) 800-160 MG tablet, Take 1 tablet by mouth 2 (two) times daily., Disp: 14 tablet, Rfl: 0 .  Syringe, Disposable, (B-D SYRINGE LUER-LOK 30CC) 30 ML MISC, Use to change catheter, Disp: 15 each, Rfl: 0 .  Wound Dressings (GRX HYDROGEL GAUZE 4X4) PADS, Use gauze to clean wound daily as needed, Disp: 200 each, Rfl: 11  No Known Allergies  Vascular Examination: Capillary refill time <3 seconds x 10 digits Dorsalis pedis and Posterior tibial pulses present b/l No  digital hair x 10 digits Skin temperature gradient within normal limits bilaterally.  Dermatological Examination: Skin with normal turgor, texture and tone b/l.  Toenails 1-5 b/l discolored, thick, dystrophic with subungual debris and pain with palpation to nailbeds due to thickness of nails.  No hyperkeratotic lesions appreciated today.    Musculoskeletal: Lower extremity muscle strength 0/5 secondary to multiple sclerosis bilaterally.  Patient uses a motorized wheelchair for mobility.  Neurological: Sensation diminished with 10 gram monofilament.  Vibratory sensation diminished  Assessment: 1. Onychomycosis toenails 1-5 b/l 2. Multiple sclerosis 3. NIDDM with Diabetic neuropathy  Plan: 1. Continue diabetic foot care principles.  2. Toenails 1-5 b/l were debrided in length and girth without iatrogenic bleeding. 3. Patient to continue soft, supportive shoe gear daily. 4. Patient to report any pedal injuries to medical professional immediately. 5. Follow up 3 months.  6. Patient/POA to call should there be a concern in the interim.

## 2019-02-07 ENCOUNTER — Encounter (HOSPITAL_BASED_OUTPATIENT_CLINIC_OR_DEPARTMENT_OTHER): Payer: Medicare Other | Attending: Internal Medicine

## 2019-02-07 DIAGNOSIS — Z993 Dependence on wheelchair: Secondary | ICD-10-CM | POA: Insufficient documentation

## 2019-02-07 DIAGNOSIS — I10 Essential (primary) hypertension: Secondary | ICD-10-CM | POA: Insufficient documentation

## 2019-02-07 DIAGNOSIS — N318 Other neuromuscular dysfunction of bladder: Secondary | ICD-10-CM | POA: Diagnosis not present

## 2019-02-07 DIAGNOSIS — E119 Type 2 diabetes mellitus without complications: Secondary | ICD-10-CM | POA: Insufficient documentation

## 2019-02-07 DIAGNOSIS — G35 Multiple sclerosis: Secondary | ICD-10-CM | POA: Insufficient documentation

## 2019-02-07 DIAGNOSIS — L89313 Pressure ulcer of right buttock, stage 3: Secondary | ICD-10-CM | POA: Insufficient documentation

## 2019-02-08 ENCOUNTER — Other Ambulatory Visit: Payer: Self-pay | Admitting: Family Medicine

## 2019-02-09 DIAGNOSIS — E119 Type 2 diabetes mellitus without complications: Secondary | ICD-10-CM | POA: Diagnosis not present

## 2019-02-09 DIAGNOSIS — G35 Multiple sclerosis: Secondary | ICD-10-CM | POA: Diagnosis not present

## 2019-02-09 DIAGNOSIS — D649 Anemia, unspecified: Secondary | ICD-10-CM | POA: Diagnosis not present

## 2019-02-09 DIAGNOSIS — M1990 Unspecified osteoarthritis, unspecified site: Secondary | ICD-10-CM | POA: Diagnosis not present

## 2019-02-09 DIAGNOSIS — E1142 Type 2 diabetes mellitus with diabetic polyneuropathy: Secondary | ICD-10-CM | POA: Diagnosis not present

## 2019-02-09 DIAGNOSIS — C541 Malignant neoplasm of endometrium: Secondary | ICD-10-CM | POA: Diagnosis not present

## 2019-02-09 NOTE — Telephone Encounter (Signed)
Requested medication (s) are due for refill today: due at the end of month   Requested medication (s) are on the active medication list: yes  Last refill:  12/05/18 #90  Future visit scheduled: yes  Notes to clinic:  No acknowledgement of the abnormal results- routing request to practice PEC pool   Requested Prescriptions  Pending Prescriptions Disp Refills   furosemide (LASIX) 20 MG tablet [Pharmacy Med Name: FUROSEMIDE 20MG  TAB] 90 tablet 0    Sig: TAKE 1 TABLET BY MOUTH ONCE DAILY     Cardiovascular:  Diuretics - Loop Failed - 02/08/2019 10:18 PM      Failed - Ca in normal range and within 360 days    Calcium  Date Value Ref Range Status  12/20/2018 8.2 (L) 8.9 - 10.3 mg/dL Final         Failed - Cr in normal range and within 360 days    Creatinine, Ser  Date Value Ref Range Status  12/20/2018 1.20 (H) 0.44 - 1.00 mg/dL Final         Failed - Last BP in normal range    BP Readings from Last 1 Encounters:  01/24/19 140/77         Passed - K in normal range and within 360 days    Potassium  Date Value Ref Range Status  12/20/2018 4.7 3.5 - 5.1 mmol/L Final         Passed - Na in normal range and within 360 days    Sodium  Date Value Ref Range Status  12/20/2018 137 135 - 145 mmol/L Final  07/19/2018 141 134 - 144 mmol/L Final         Passed - Valid encounter within last 6 months    Recent Outpatient Visits          2 weeks ago Chronically on opiate therapy   Primary Care at [facility], [name], MD   6 months ago Chronically on opiate therapy   Primary Care at [place], [place] A, MD   1 year ago Type 2 diabetes mellitus with complication, without long-term current use of insulin ([place])   Primary Care at [facility], [name], MD   1 year ago Screening for cervical cancer   Primary Care at [facility], [name], MD   1 year ago Essential hypertension   Primary Care at [place], MD      Future Appointments            In 5 months  Forrest Moron, MD Primary Care at [place], [facility]

## 2019-02-14 ENCOUNTER — Ambulatory Visit
Admission: RE | Admit: 2019-02-14 | Discharge: 2019-02-14 | Disposition: A | Discharge status: Home / Self Care | Payer: Medicare Other | Source: Ambulatory Visit | Attending: Radiation Oncology | Admitting: Radiation Oncology

## 2019-02-14 ENCOUNTER — Other Ambulatory Visit: Payer: Self-pay

## 2019-02-14 DIAGNOSIS — M1990 Unspecified osteoarthritis, unspecified site: Secondary | ICD-10-CM | POA: Diagnosis not present

## 2019-02-14 DIAGNOSIS — E1142 Type 2 diabetes mellitus with diabetic polyneuropathy: Secondary | ICD-10-CM | POA: Diagnosis not present

## 2019-02-14 DIAGNOSIS — E119 Type 2 diabetes mellitus without complications: Secondary | ICD-10-CM | POA: Diagnosis not present

## 2019-02-14 DIAGNOSIS — G35 Multiple sclerosis: Secondary | ICD-10-CM | POA: Diagnosis not present

## 2019-02-14 DIAGNOSIS — D649 Anemia, unspecified: Secondary | ICD-10-CM | POA: Diagnosis not present

## 2019-02-14 DIAGNOSIS — C541 Malignant neoplasm of endometrium: Secondary | ICD-10-CM | POA: Diagnosis not present

## 2019-02-15 ENCOUNTER — Ambulatory Visit
Admission: RE | Admit: 2019-02-15 | Discharge: 2019-02-15 | Disposition: A | Discharge status: Home / Self Care | Payer: Medicare Other | Source: Ambulatory Visit | Attending: Radiation Oncology | Admitting: Radiation Oncology

## 2019-02-15 ENCOUNTER — Other Ambulatory Visit: Payer: Self-pay

## 2019-02-15 DIAGNOSIS — D649 Anemia, unspecified: Secondary | ICD-10-CM | POA: Diagnosis not present

## 2019-02-15 DIAGNOSIS — Z993 Dependence on wheelchair: Secondary | ICD-10-CM | POA: Diagnosis not present

## 2019-02-15 DIAGNOSIS — E119 Type 2 diabetes mellitus without complications: Secondary | ICD-10-CM | POA: Diagnosis not present

## 2019-02-15 DIAGNOSIS — C541 Malignant neoplasm of endometrium: Secondary | ICD-10-CM | POA: Diagnosis not present

## 2019-02-15 DIAGNOSIS — I10 Essential (primary) hypertension: Secondary | ICD-10-CM | POA: Diagnosis not present

## 2019-02-15 DIAGNOSIS — M1990 Unspecified osteoarthritis, unspecified site: Secondary | ICD-10-CM | POA: Diagnosis not present

## 2019-02-15 DIAGNOSIS — L89313 Pressure ulcer of right buttock, stage 3: Secondary | ICD-10-CM | POA: Diagnosis not present

## 2019-02-15 DIAGNOSIS — G35 Multiple sclerosis: Secondary | ICD-10-CM | POA: Diagnosis not present

## 2019-02-15 DIAGNOSIS — E1142 Type 2 diabetes mellitus with diabetic polyneuropathy: Secondary | ICD-10-CM | POA: Diagnosis not present

## 2019-02-16 ENCOUNTER — Ambulatory Visit
Admission: RE | Admit: 2019-02-16 | Discharge: 2019-02-16 | Disposition: A | Discharge status: Home / Self Care | Payer: Medicare Other | Source: Ambulatory Visit | Attending: Radiation Oncology | Admitting: Radiation Oncology

## 2019-02-16 ENCOUNTER — Telehealth: Payer: Self-pay

## 2019-02-16 ENCOUNTER — Other Ambulatory Visit: Payer: Self-pay

## 2019-02-16 ENCOUNTER — Other Ambulatory Visit: Payer: Self-pay | Admitting: Radiation Oncology

## 2019-02-16 DIAGNOSIS — D649 Anemia, unspecified: Secondary | ICD-10-CM | POA: Diagnosis not present

## 2019-02-16 DIAGNOSIS — G35 Multiple sclerosis: Secondary | ICD-10-CM | POA: Diagnosis not present

## 2019-02-16 DIAGNOSIS — C541 Malignant neoplasm of endometrium: Secondary | ICD-10-CM | POA: Diagnosis not present

## 2019-02-16 DIAGNOSIS — M1990 Unspecified osteoarthritis, unspecified site: Secondary | ICD-10-CM | POA: Diagnosis not present

## 2019-02-16 DIAGNOSIS — E1142 Type 2 diabetes mellitus with diabetic polyneuropathy: Secondary | ICD-10-CM | POA: Diagnosis not present

## 2019-02-16 DIAGNOSIS — N318 Other neuromuscular dysfunction of bladder: Secondary | ICD-10-CM | POA: Diagnosis not present

## 2019-02-16 DIAGNOSIS — E119 Type 2 diabetes mellitus without complications: Secondary | ICD-10-CM | POA: Diagnosis not present

## 2019-02-16 MED ORDER — PROMETHAZINE HCL 25 MG PO TABS: 25.0000 mg | ORAL_TABLET | Freq: Four times a day (QID) | ORAL | 0 refills | Status: DC | PRN

## 2019-02-16 NOTE — Telephone Encounter (Signed)
Spoke with pt on treatment machine. Pt states that nausea began on first day of radiation treatment and persists, no vomiting. Pt requesting anti-nausea medication. Conveyed to pt that Dr. Sondra Come is out today and this nurse would convey request to his partner, Dr. Isidore Moos. Pt uses Haematologist. Pt verbalized understanding. Pt to contact pharmacy later today to check status of prescription. Loma Sousa, RN BSN

## 2019-02-19 ENCOUNTER — Ambulatory Visit
Admission: RE | Admit: 2019-02-19 | Discharge: 2019-02-19 | Disposition: A | Discharge status: Home / Self Care | Payer: Medicare Other | Source: Ambulatory Visit | Attending: Radiation Oncology | Admitting: Radiation Oncology

## 2019-02-19 ENCOUNTER — Other Ambulatory Visit: Payer: Self-pay

## 2019-02-19 DIAGNOSIS — E1142 Type 2 diabetes mellitus with diabetic polyneuropathy: Secondary | ICD-10-CM | POA: Diagnosis not present

## 2019-02-19 DIAGNOSIS — D649 Anemia, unspecified: Secondary | ICD-10-CM | POA: Diagnosis not present

## 2019-02-19 DIAGNOSIS — E119 Type 2 diabetes mellitus without complications: Secondary | ICD-10-CM | POA: Diagnosis not present

## 2019-02-19 DIAGNOSIS — C541 Malignant neoplasm of endometrium: Secondary | ICD-10-CM | POA: Diagnosis not present

## 2019-02-19 DIAGNOSIS — G35 Multiple sclerosis: Secondary | ICD-10-CM | POA: Diagnosis not present

## 2019-02-19 DIAGNOSIS — M1990 Unspecified osteoarthritis, unspecified site: Secondary | ICD-10-CM | POA: Diagnosis not present

## 2019-02-20 ENCOUNTER — Other Ambulatory Visit: Payer: Self-pay

## 2019-02-20 ENCOUNTER — Ambulatory Visit
Admission: RE | Admit: 2019-02-20 | Discharge: 2019-02-20 | Disposition: A | Discharge status: Home / Self Care | Payer: Medicare Other | Source: Ambulatory Visit | Attending: Radiation Oncology | Admitting: Radiation Oncology

## 2019-02-20 ENCOUNTER — Other Ambulatory Visit: Payer: Self-pay | Admitting: Radiation Oncology

## 2019-02-20 DIAGNOSIS — D649 Anemia, unspecified: Secondary | ICD-10-CM | POA: Diagnosis not present

## 2019-02-20 DIAGNOSIS — G35 Multiple sclerosis: Secondary | ICD-10-CM | POA: Diagnosis not present

## 2019-02-20 DIAGNOSIS — C541 Malignant neoplasm of endometrium: Secondary | ICD-10-CM | POA: Diagnosis not present

## 2019-02-20 DIAGNOSIS — M1990 Unspecified osteoarthritis, unspecified site: Secondary | ICD-10-CM | POA: Diagnosis not present

## 2019-02-20 DIAGNOSIS — E1142 Type 2 diabetes mellitus with diabetic polyneuropathy: Secondary | ICD-10-CM | POA: Diagnosis not present

## 2019-02-20 DIAGNOSIS — E119 Type 2 diabetes mellitus without complications: Secondary | ICD-10-CM | POA: Diagnosis not present

## 2019-02-20 MED ORDER — HYDROCORTISONE 2.5 % RE CREA: 1.0000 "application " | TOPICAL_CREAM | Freq: Two times a day (BID) | RECTAL | 0 refills | Status: DC

## 2019-02-21 ENCOUNTER — Ambulatory Visit
Admission: RE | Admit: 2019-02-21 | Discharge: 2019-02-21 | Disposition: A | Discharge status: Home / Self Care | Payer: Medicare Other | Source: Ambulatory Visit | Attending: Radiation Oncology | Admitting: Radiation Oncology

## 2019-02-21 ENCOUNTER — Other Ambulatory Visit: Payer: Self-pay

## 2019-02-21 DIAGNOSIS — G35 Multiple sclerosis: Secondary | ICD-10-CM | POA: Diagnosis not present

## 2019-02-21 DIAGNOSIS — E119 Type 2 diabetes mellitus without complications: Secondary | ICD-10-CM | POA: Diagnosis not present

## 2019-02-21 DIAGNOSIS — M1990 Unspecified osteoarthritis, unspecified site: Secondary | ICD-10-CM | POA: Diagnosis not present

## 2019-02-21 DIAGNOSIS — D649 Anemia, unspecified: Secondary | ICD-10-CM | POA: Diagnosis not present

## 2019-02-21 DIAGNOSIS — C541 Malignant neoplasm of endometrium: Secondary | ICD-10-CM | POA: Diagnosis not present

## 2019-02-21 DIAGNOSIS — E1142 Type 2 diabetes mellitus with diabetic polyneuropathy: Secondary | ICD-10-CM | POA: Diagnosis not present

## 2019-02-22 ENCOUNTER — Ambulatory Visit
Admission: RE | Admit: 2019-02-22 | Discharge: 2019-02-22 | Disposition: A | Discharge status: Home / Self Care | Payer: Medicare Other | Source: Ambulatory Visit | Attending: Radiation Oncology | Admitting: Radiation Oncology

## 2019-02-22 ENCOUNTER — Other Ambulatory Visit: Payer: Self-pay

## 2019-02-22 DIAGNOSIS — C541 Malignant neoplasm of endometrium: Secondary | ICD-10-CM | POA: Diagnosis not present

## 2019-02-22 DIAGNOSIS — E1142 Type 2 diabetes mellitus with diabetic polyneuropathy: Secondary | ICD-10-CM | POA: Diagnosis not present

## 2019-02-22 DIAGNOSIS — M1990 Unspecified osteoarthritis, unspecified site: Secondary | ICD-10-CM | POA: Diagnosis not present

## 2019-02-22 DIAGNOSIS — E119 Type 2 diabetes mellitus without complications: Secondary | ICD-10-CM | POA: Diagnosis not present

## 2019-02-22 DIAGNOSIS — G35 Multiple sclerosis: Secondary | ICD-10-CM | POA: Diagnosis not present

## 2019-02-22 DIAGNOSIS — D649 Anemia, unspecified: Secondary | ICD-10-CM | POA: Diagnosis not present

## 2019-02-23 ENCOUNTER — Ambulatory Visit
Admission: RE | Admit: 2019-02-23 | Discharge: 2019-02-23 | Disposition: A | Discharge status: Home / Self Care | Payer: Medicare Other | Source: Ambulatory Visit | Attending: Radiation Oncology | Admitting: Radiation Oncology

## 2019-02-23 ENCOUNTER — Other Ambulatory Visit: Payer: Self-pay

## 2019-02-23 DIAGNOSIS — M1990 Unspecified osteoarthritis, unspecified site: Secondary | ICD-10-CM | POA: Diagnosis not present

## 2019-02-23 DIAGNOSIS — D649 Anemia, unspecified: Secondary | ICD-10-CM | POA: Diagnosis not present

## 2019-02-23 DIAGNOSIS — C541 Malignant neoplasm of endometrium: Secondary | ICD-10-CM | POA: Diagnosis not present

## 2019-02-23 DIAGNOSIS — E119 Type 2 diabetes mellitus without complications: Secondary | ICD-10-CM | POA: Diagnosis not present

## 2019-02-23 DIAGNOSIS — G35 Multiple sclerosis: Secondary | ICD-10-CM | POA: Diagnosis not present

## 2019-02-23 DIAGNOSIS — E1142 Type 2 diabetes mellitus with diabetic polyneuropathy: Secondary | ICD-10-CM | POA: Diagnosis not present

## 2019-02-26 ENCOUNTER — Ambulatory Visit
Admission: RE | Admit: 2019-02-26 | Discharge: 2019-02-26 | Disposition: A | Discharge status: Home / Self Care | Payer: Medicare Other | Source: Ambulatory Visit | Attending: Radiation Oncology | Admitting: Radiation Oncology

## 2019-02-26 ENCOUNTER — Other Ambulatory Visit: Payer: Self-pay

## 2019-02-26 DIAGNOSIS — C541 Malignant neoplasm of endometrium: Secondary | ICD-10-CM | POA: Diagnosis not present

## 2019-02-26 DIAGNOSIS — G35 Multiple sclerosis: Secondary | ICD-10-CM | POA: Diagnosis not present

## 2019-02-26 DIAGNOSIS — D649 Anemia, unspecified: Secondary | ICD-10-CM | POA: Diagnosis not present

## 2019-02-26 DIAGNOSIS — E119 Type 2 diabetes mellitus without complications: Secondary | ICD-10-CM | POA: Diagnosis not present

## 2019-02-26 DIAGNOSIS — E1142 Type 2 diabetes mellitus with diabetic polyneuropathy: Secondary | ICD-10-CM | POA: Diagnosis not present

## 2019-02-26 DIAGNOSIS — M1990 Unspecified osteoarthritis, unspecified site: Secondary | ICD-10-CM | POA: Diagnosis not present

## 2019-02-27 ENCOUNTER — Other Ambulatory Visit: Payer: Self-pay | Admitting: Radiation Oncology

## 2019-02-27 ENCOUNTER — Other Ambulatory Visit: Payer: Self-pay

## 2019-02-27 ENCOUNTER — Ambulatory Visit
Admission: RE | Admit: 2019-02-27 | Discharge: 2019-02-27 | Disposition: A | Discharge status: Home / Self Care | Payer: Medicare Other | Source: Ambulatory Visit | Attending: Radiation Oncology | Admitting: Radiation Oncology

## 2019-02-27 DIAGNOSIS — G35 Multiple sclerosis: Secondary | ICD-10-CM | POA: Diagnosis not present

## 2019-02-27 DIAGNOSIS — E119 Type 2 diabetes mellitus without complications: Secondary | ICD-10-CM | POA: Diagnosis not present

## 2019-02-27 DIAGNOSIS — D649 Anemia, unspecified: Secondary | ICD-10-CM | POA: Diagnosis not present

## 2019-02-27 DIAGNOSIS — M1990 Unspecified osteoarthritis, unspecified site: Secondary | ICD-10-CM | POA: Diagnosis not present

## 2019-02-27 DIAGNOSIS — E1142 Type 2 diabetes mellitus with diabetic polyneuropathy: Secondary | ICD-10-CM | POA: Diagnosis not present

## 2019-02-27 DIAGNOSIS — C541 Malignant neoplasm of endometrium: Secondary | ICD-10-CM | POA: Diagnosis not present

## 2019-02-27 MED ORDER — HYDROCORTISONE ACETATE 25 MG RE SUPP: 25.0000 mg | Freq: Two times a day (BID) | RECTAL | 0 refills | Status: DC

## 2019-02-28 ENCOUNTER — Ambulatory Visit
Admission: RE | Admit: 2019-02-28 | Discharge: 2019-02-28 | Disposition: A | Discharge status: Home / Self Care | Payer: Medicare Other | Source: Ambulatory Visit | Attending: Radiation Oncology | Admitting: Radiation Oncology

## 2019-02-28 ENCOUNTER — Encounter (HOSPITAL_COMMUNITY): Payer: Self-pay | Admitting: Physician Assistant

## 2019-02-28 ENCOUNTER — Other Ambulatory Visit: Payer: Self-pay

## 2019-02-28 DIAGNOSIS — E1142 Type 2 diabetes mellitus with diabetic polyneuropathy: Secondary | ICD-10-CM | POA: Diagnosis not present

## 2019-02-28 DIAGNOSIS — D649 Anemia, unspecified: Secondary | ICD-10-CM | POA: Diagnosis not present

## 2019-02-28 DIAGNOSIS — E119 Type 2 diabetes mellitus without complications: Secondary | ICD-10-CM | POA: Diagnosis not present

## 2019-02-28 DIAGNOSIS — G35 Multiple sclerosis: Secondary | ICD-10-CM | POA: Diagnosis not present

## 2019-02-28 DIAGNOSIS — M1990 Unspecified osteoarthritis, unspecified site: Secondary | ICD-10-CM | POA: Diagnosis not present

## 2019-02-28 DIAGNOSIS — C541 Malignant neoplasm of endometrium: Secondary | ICD-10-CM | POA: Diagnosis not present

## 2019-03-01 ENCOUNTER — Other Ambulatory Visit: Payer: Self-pay

## 2019-03-01 ENCOUNTER — Ambulatory Visit
Admission: RE | Admit: 2019-03-01 | Discharge: 2019-03-01 | Disposition: A | Discharge status: Home / Self Care | Payer: Medicare Other | Source: Ambulatory Visit | Attending: Radiation Oncology | Admitting: Radiation Oncology

## 2019-03-01 DIAGNOSIS — C541 Malignant neoplasm of endometrium: Secondary | ICD-10-CM | POA: Diagnosis not present

## 2019-03-01 DIAGNOSIS — G35 Multiple sclerosis: Secondary | ICD-10-CM | POA: Diagnosis not present

## 2019-03-01 DIAGNOSIS — E119 Type 2 diabetes mellitus without complications: Secondary | ICD-10-CM | POA: Diagnosis not present

## 2019-03-01 DIAGNOSIS — E1142 Type 2 diabetes mellitus with diabetic polyneuropathy: Secondary | ICD-10-CM | POA: Diagnosis not present

## 2019-03-01 DIAGNOSIS — D649 Anemia, unspecified: Secondary | ICD-10-CM | POA: Diagnosis not present

## 2019-03-01 DIAGNOSIS — M1990 Unspecified osteoarthritis, unspecified site: Secondary | ICD-10-CM | POA: Diagnosis not present

## 2019-03-02 ENCOUNTER — Ambulatory Visit
Admission: RE | Admit: 2019-03-02 | Discharge: 2019-03-02 | Disposition: A | Discharge status: Home / Self Care | Payer: Medicare Other | Source: Ambulatory Visit | Attending: Radiation Oncology | Admitting: Radiation Oncology

## 2019-03-02 ENCOUNTER — Other Ambulatory Visit: Payer: Self-pay

## 2019-03-02 DIAGNOSIS — E1142 Type 2 diabetes mellitus with diabetic polyneuropathy: Secondary | ICD-10-CM | POA: Diagnosis not present

## 2019-03-02 DIAGNOSIS — M1990 Unspecified osteoarthritis, unspecified site: Secondary | ICD-10-CM | POA: Diagnosis not present

## 2019-03-02 DIAGNOSIS — G35 Multiple sclerosis: Secondary | ICD-10-CM | POA: Diagnosis not present

## 2019-03-02 DIAGNOSIS — E119 Type 2 diabetes mellitus without complications: Secondary | ICD-10-CM | POA: Diagnosis not present

## 2019-03-02 DIAGNOSIS — C541 Malignant neoplasm of endometrium: Secondary | ICD-10-CM | POA: Diagnosis not present

## 2019-03-02 DIAGNOSIS — D649 Anemia, unspecified: Secondary | ICD-10-CM | POA: Diagnosis not present

## 2019-03-05 ENCOUNTER — Other Ambulatory Visit: Payer: Self-pay | Admitting: Family Medicine

## 2019-03-05 ENCOUNTER — Ambulatory Visit
Admission: RE | Admit: 2019-03-05 | Discharge: 2019-03-05 | Disposition: A | Discharge status: Home / Self Care | Payer: Medicare Other | Source: Ambulatory Visit | Attending: Radiation Oncology | Admitting: Radiation Oncology

## 2019-03-05 ENCOUNTER — Other Ambulatory Visit: Payer: Self-pay

## 2019-03-05 DIAGNOSIS — D649 Anemia, unspecified: Secondary | ICD-10-CM | POA: Diagnosis not present

## 2019-03-05 DIAGNOSIS — M1990 Unspecified osteoarthritis, unspecified site: Secondary | ICD-10-CM | POA: Diagnosis not present

## 2019-03-05 DIAGNOSIS — E1142 Type 2 diabetes mellitus with diabetic polyneuropathy: Secondary | ICD-10-CM | POA: Diagnosis not present

## 2019-03-05 DIAGNOSIS — G35 Multiple sclerosis: Secondary | ICD-10-CM | POA: Diagnosis not present

## 2019-03-05 DIAGNOSIS — C541 Malignant neoplasm of endometrium: Secondary | ICD-10-CM | POA: Diagnosis not present

## 2019-03-05 DIAGNOSIS — E119 Type 2 diabetes mellitus without complications: Secondary | ICD-10-CM | POA: Diagnosis not present

## 2019-03-05 NOTE — Telephone Encounter (Signed)
Requested medication (s) are due for refill today: yes  Requested medication (s) are on the active medication list: yes  Last refill:01/12/2019  #30 0 refills  Future visit scheduled no  Notes to clinic:not delegated  Requested Prescriptions  Pending Prescriptions Disp Refills   baclofen (LIORESAL) 20 MG tablet [Pharmacy Med Name: BACLOFEN 20 MG TAB] 30 tablet 0    Sig: TAKE 1 TABLET BY MOUTH FOUR TIMES A DAY**OFFICE VISIT NEEDED**     Not Delegated - Analgesics:  Muscle Relaxants Failed - 03/05/2019 11:59 AM      Failed - This refill cannot be delegated      Passed - Valid encounter within last 6 months    Recent Outpatient Visits          1 month ago Chronically on opiate therapy   Primary Care at [facility], [name], MD   7 months ago Chronically on opiate therapy   Primary Care at [facility], [place] A, MD   1 year ago Type 2 diabetes mellitus with complication, without long-term current use of insulin ([place])   Primary Care at [facility], [name], MD   1 year ago Screening for cervical cancer   Primary Care at [facility], [name], MD   1 year ago Essential hypertension   Primary Care at [place], MD      Future Appointments            In 4 months Forrest Moron, MD Primary Care at [place], [facility]

## 2019-03-06 ENCOUNTER — Ambulatory Visit
Admission: RE | Admit: 2019-03-06 | Discharge: 2019-03-06 | Disposition: A | Discharge status: Home / Self Care | Payer: Medicare Other | Source: Ambulatory Visit | Attending: Radiation Oncology | Admitting: Radiation Oncology

## 2019-03-06 ENCOUNTER — Other Ambulatory Visit: Payer: Self-pay

## 2019-03-06 DIAGNOSIS — M1990 Unspecified osteoarthritis, unspecified site: Secondary | ICD-10-CM | POA: Diagnosis not present

## 2019-03-06 DIAGNOSIS — C541 Malignant neoplasm of endometrium: Secondary | ICD-10-CM | POA: Diagnosis not present

## 2019-03-06 DIAGNOSIS — E119 Type 2 diabetes mellitus without complications: Secondary | ICD-10-CM | POA: Diagnosis not present

## 2019-03-06 DIAGNOSIS — E1142 Type 2 diabetes mellitus with diabetic polyneuropathy: Secondary | ICD-10-CM | POA: Diagnosis not present

## 2019-03-06 DIAGNOSIS — D649 Anemia, unspecified: Secondary | ICD-10-CM | POA: Diagnosis not present

## 2019-03-06 DIAGNOSIS — G35 Multiple sclerosis: Secondary | ICD-10-CM | POA: Diagnosis not present

## 2019-03-06 NOTE — Telephone Encounter (Signed)
Please advise  Patient is requesting a refill of the following medications: Requested Prescriptions   Pending Prescriptions Disp Refills  . baclofen (LIORESAL) 20 MG tablet [Pharmacy Med Name: BACLOFEN 20 MG TAB] 30 tablet 0    Sig: TAKE 1 TABLET BY MOUTH FOUR TIMES A DAY**OFFICE VISIT NEEDED**    Date of patient request: 03/06/19  Last office visit: 01/24/19 Date of last refill: 01/12/19 Last refill amount: 30 Follow up time period per chart:07/26/19

## 2019-03-07 ENCOUNTER — Other Ambulatory Visit: Payer: Self-pay

## 2019-03-07 ENCOUNTER — Ambulatory Visit
Admission: RE | Admit: 2019-03-07 | Discharge: 2019-03-07 | Disposition: A | Discharge status: Home / Self Care | Payer: Medicare Other | Source: Ambulatory Visit | Attending: Radiation Oncology | Admitting: Radiation Oncology

## 2019-03-07 DIAGNOSIS — E119 Type 2 diabetes mellitus without complications: Secondary | ICD-10-CM | POA: Diagnosis not present

## 2019-03-07 DIAGNOSIS — Z90710 Acquired absence of both cervix and uterus: Secondary | ICD-10-CM | POA: Diagnosis not present

## 2019-03-07 DIAGNOSIS — E1142 Type 2 diabetes mellitus with diabetic polyneuropathy: Secondary | ICD-10-CM | POA: Insufficient documentation

## 2019-03-07 DIAGNOSIS — E782 Mixed hyperlipidemia: Secondary | ICD-10-CM | POA: Diagnosis not present

## 2019-03-07 DIAGNOSIS — D649 Anemia, unspecified: Secondary | ICD-10-CM | POA: Diagnosis not present

## 2019-03-07 DIAGNOSIS — M1990 Unspecified osteoarthritis, unspecified site: Secondary | ICD-10-CM | POA: Insufficient documentation

## 2019-03-07 DIAGNOSIS — C541 Malignant neoplasm of endometrium: Secondary | ICD-10-CM | POA: Diagnosis not present

## 2019-03-07 DIAGNOSIS — G35 Multiple sclerosis: Secondary | ICD-10-CM | POA: Diagnosis not present

## 2019-03-07 DIAGNOSIS — Z79899 Other long term (current) drug therapy: Secondary | ICD-10-CM | POA: Diagnosis not present

## 2019-03-07 DIAGNOSIS — Z90722 Acquired absence of ovaries, bilateral: Secondary | ICD-10-CM | POA: Insufficient documentation

## 2019-03-08 ENCOUNTER — Other Ambulatory Visit: Payer: Self-pay

## 2019-03-08 ENCOUNTER — Other Ambulatory Visit: Payer: Self-pay | Admitting: Family Medicine

## 2019-03-08 ENCOUNTER — Ambulatory Visit
Admission: RE | Admit: 2019-03-08 | Discharge: 2019-03-08 | Disposition: A | Discharge status: Home / Self Care | Payer: Medicare Other | Source: Ambulatory Visit | Attending: Radiation Oncology | Admitting: Radiation Oncology

## 2019-03-08 DIAGNOSIS — E119 Type 2 diabetes mellitus without complications: Secondary | ICD-10-CM | POA: Diagnosis not present

## 2019-03-08 DIAGNOSIS — C541 Malignant neoplasm of endometrium: Secondary | ICD-10-CM | POA: Diagnosis not present

## 2019-03-08 DIAGNOSIS — D649 Anemia, unspecified: Secondary | ICD-10-CM | POA: Diagnosis not present

## 2019-03-08 DIAGNOSIS — G35 Multiple sclerosis: Secondary | ICD-10-CM | POA: Diagnosis not present

## 2019-03-08 DIAGNOSIS — M1990 Unspecified osteoarthritis, unspecified site: Secondary | ICD-10-CM | POA: Diagnosis not present

## 2019-03-08 DIAGNOSIS — E1142 Type 2 diabetes mellitus with diabetic polyneuropathy: Secondary | ICD-10-CM | POA: Diagnosis not present

## 2019-03-09 ENCOUNTER — Other Ambulatory Visit: Payer: Self-pay

## 2019-03-09 ENCOUNTER — Ambulatory Visit
Admission: RE | Admit: 2019-03-09 | Discharge: 2019-03-09 | Disposition: A | Discharge status: Home / Self Care | Payer: Medicare Other | Source: Ambulatory Visit | Attending: Radiation Oncology | Admitting: Radiation Oncology

## 2019-03-09 DIAGNOSIS — M1990 Unspecified osteoarthritis, unspecified site: Secondary | ICD-10-CM | POA: Diagnosis not present

## 2019-03-09 DIAGNOSIS — E1142 Type 2 diabetes mellitus with diabetic polyneuropathy: Secondary | ICD-10-CM | POA: Diagnosis not present

## 2019-03-09 DIAGNOSIS — G35 Multiple sclerosis: Secondary | ICD-10-CM | POA: Diagnosis not present

## 2019-03-09 DIAGNOSIS — E119 Type 2 diabetes mellitus without complications: Secondary | ICD-10-CM | POA: Diagnosis not present

## 2019-03-09 DIAGNOSIS — D649 Anemia, unspecified: Secondary | ICD-10-CM | POA: Diagnosis not present

## 2019-03-09 DIAGNOSIS — C541 Malignant neoplasm of endometrium: Secondary | ICD-10-CM | POA: Diagnosis not present

## 2019-03-09 DIAGNOSIS — N318 Other neuromuscular dysfunction of bladder: Secondary | ICD-10-CM | POA: Diagnosis not present

## 2019-03-12 ENCOUNTER — Ambulatory Visit
Admission: RE | Admit: 2019-03-12 | Discharge: 2019-03-12 | Disposition: A | Discharge status: Home / Self Care | Payer: Medicare Other | Source: Ambulatory Visit | Attending: Radiation Oncology | Admitting: Radiation Oncology

## 2019-03-12 ENCOUNTER — Other Ambulatory Visit: Payer: Self-pay

## 2019-03-12 DIAGNOSIS — D649 Anemia, unspecified: Secondary | ICD-10-CM | POA: Diagnosis not present

## 2019-03-12 DIAGNOSIS — E1142 Type 2 diabetes mellitus with diabetic polyneuropathy: Secondary | ICD-10-CM | POA: Diagnosis not present

## 2019-03-12 DIAGNOSIS — G35 Multiple sclerosis: Secondary | ICD-10-CM | POA: Diagnosis not present

## 2019-03-12 DIAGNOSIS — E119 Type 2 diabetes mellitus without complications: Secondary | ICD-10-CM | POA: Diagnosis not present

## 2019-03-12 DIAGNOSIS — M1990 Unspecified osteoarthritis, unspecified site: Secondary | ICD-10-CM | POA: Diagnosis not present

## 2019-03-12 DIAGNOSIS — C541 Malignant neoplasm of endometrium: Secondary | ICD-10-CM | POA: Diagnosis not present

## 2019-03-13 ENCOUNTER — Other Ambulatory Visit: Payer: Self-pay

## 2019-03-13 ENCOUNTER — Ambulatory Visit
Admission: RE | Admit: 2019-03-13 | Discharge: 2019-03-13 | Disposition: A | Discharge status: Home / Self Care | Payer: Medicare Other | Source: Ambulatory Visit | Attending: Radiation Oncology | Admitting: Radiation Oncology

## 2019-03-13 DIAGNOSIS — D649 Anemia, unspecified: Secondary | ICD-10-CM | POA: Diagnosis not present

## 2019-03-13 DIAGNOSIS — G35 Multiple sclerosis: Secondary | ICD-10-CM | POA: Diagnosis not present

## 2019-03-13 DIAGNOSIS — C541 Malignant neoplasm of endometrium: Secondary | ICD-10-CM | POA: Diagnosis not present

## 2019-03-13 DIAGNOSIS — E1142 Type 2 diabetes mellitus with diabetic polyneuropathy: Secondary | ICD-10-CM | POA: Diagnosis not present

## 2019-03-13 DIAGNOSIS — E119 Type 2 diabetes mellitus without complications: Secondary | ICD-10-CM | POA: Diagnosis not present

## 2019-03-13 DIAGNOSIS — M1990 Unspecified osteoarthritis, unspecified site: Secondary | ICD-10-CM | POA: Diagnosis not present

## 2019-03-14 ENCOUNTER — Ambulatory Visit
Admission: RE | Admit: 2019-03-14 | Discharge: 2019-03-14 | Disposition: A | Discharge status: Home / Self Care | Payer: Medicare Other | Source: Ambulatory Visit | Attending: Radiation Oncology | Admitting: Radiation Oncology

## 2019-03-14 ENCOUNTER — Other Ambulatory Visit: Payer: Self-pay

## 2019-03-14 DIAGNOSIS — D649 Anemia, unspecified: Secondary | ICD-10-CM | POA: Diagnosis not present

## 2019-03-14 DIAGNOSIS — M1990 Unspecified osteoarthritis, unspecified site: Secondary | ICD-10-CM | POA: Diagnosis not present

## 2019-03-14 DIAGNOSIS — E119 Type 2 diabetes mellitus without complications: Secondary | ICD-10-CM | POA: Diagnosis not present

## 2019-03-14 DIAGNOSIS — E1142 Type 2 diabetes mellitus with diabetic polyneuropathy: Secondary | ICD-10-CM | POA: Diagnosis not present

## 2019-03-14 DIAGNOSIS — G35 Multiple sclerosis: Secondary | ICD-10-CM | POA: Diagnosis not present

## 2019-03-14 DIAGNOSIS — C541 Malignant neoplasm of endometrium: Secondary | ICD-10-CM | POA: Diagnosis not present

## 2019-03-15 ENCOUNTER — Ambulatory Visit
Admission: RE | Admit: 2019-03-15 | Discharge: 2019-03-15 | Disposition: A | Discharge status: Home / Self Care | Payer: Medicare Other | Source: Ambulatory Visit | Attending: Radiation Oncology | Admitting: Radiation Oncology

## 2019-03-15 ENCOUNTER — Other Ambulatory Visit: Payer: Self-pay

## 2019-03-15 DIAGNOSIS — D649 Anemia, unspecified: Secondary | ICD-10-CM | POA: Diagnosis not present

## 2019-03-15 DIAGNOSIS — G35 Multiple sclerosis: Secondary | ICD-10-CM | POA: Diagnosis not present

## 2019-03-15 DIAGNOSIS — C541 Malignant neoplasm of endometrium: Secondary | ICD-10-CM | POA: Diagnosis not present

## 2019-03-15 DIAGNOSIS — M1990 Unspecified osteoarthritis, unspecified site: Secondary | ICD-10-CM | POA: Diagnosis not present

## 2019-03-15 DIAGNOSIS — E1142 Type 2 diabetes mellitus with diabetic polyneuropathy: Secondary | ICD-10-CM | POA: Diagnosis not present

## 2019-03-15 DIAGNOSIS — E119 Type 2 diabetes mellitus without complications: Secondary | ICD-10-CM | POA: Diagnosis not present

## 2019-03-16 ENCOUNTER — Other Ambulatory Visit: Payer: Self-pay

## 2019-03-16 ENCOUNTER — Encounter (HOSPITAL_BASED_OUTPATIENT_CLINIC_OR_DEPARTMENT_OTHER): Payer: Medicare Other | Attending: Internal Medicine

## 2019-03-16 ENCOUNTER — Ambulatory Visit
Admission: RE | Admit: 2019-03-16 | Discharge: 2019-03-16 | Disposition: A | Discharge status: Home / Self Care | Payer: Medicare Other | Source: Ambulatory Visit | Attending: Radiation Oncology | Admitting: Radiation Oncology

## 2019-03-16 DIAGNOSIS — E119 Type 2 diabetes mellitus without complications: Secondary | ICD-10-CM | POA: Diagnosis not present

## 2019-03-16 DIAGNOSIS — G35 Multiple sclerosis: Secondary | ICD-10-CM | POA: Insufficient documentation

## 2019-03-16 DIAGNOSIS — M1990 Unspecified osteoarthritis, unspecified site: Secondary | ICD-10-CM | POA: Diagnosis not present

## 2019-03-16 DIAGNOSIS — D649 Anemia, unspecified: Secondary | ICD-10-CM | POA: Diagnosis not present

## 2019-03-16 DIAGNOSIS — L89313 Pressure ulcer of right buttock, stage 3: Secondary | ICD-10-CM | POA: Diagnosis not present

## 2019-03-16 DIAGNOSIS — L89320 Pressure ulcer of left buttock, unstageable: Secondary | ICD-10-CM | POA: Insufficient documentation

## 2019-03-16 DIAGNOSIS — I10 Essential (primary) hypertension: Secondary | ICD-10-CM | POA: Insufficient documentation

## 2019-03-16 DIAGNOSIS — E1142 Type 2 diabetes mellitus with diabetic polyneuropathy: Secondary | ICD-10-CM | POA: Diagnosis not present

## 2019-03-16 DIAGNOSIS — C541 Malignant neoplasm of endometrium: Secondary | ICD-10-CM | POA: Diagnosis not present

## 2019-03-16 DIAGNOSIS — E1140 Type 2 diabetes mellitus with diabetic neuropathy, unspecified: Secondary | ICD-10-CM | POA: Insufficient documentation

## 2019-03-19 ENCOUNTER — Ambulatory Visit
Admission: RE | Admit: 2019-03-19 | Discharge: 2019-03-19 | Disposition: A | Discharge status: Home / Self Care | Payer: Medicare Other | Source: Ambulatory Visit | Attending: Radiation Oncology | Admitting: Radiation Oncology

## 2019-03-19 ENCOUNTER — Other Ambulatory Visit: Payer: Self-pay

## 2019-03-19 DIAGNOSIS — D649 Anemia, unspecified: Secondary | ICD-10-CM | POA: Diagnosis not present

## 2019-03-19 DIAGNOSIS — E1142 Type 2 diabetes mellitus with diabetic polyneuropathy: Secondary | ICD-10-CM | POA: Diagnosis not present

## 2019-03-19 DIAGNOSIS — G35 Multiple sclerosis: Secondary | ICD-10-CM | POA: Diagnosis not present

## 2019-03-19 DIAGNOSIS — E119 Type 2 diabetes mellitus without complications: Secondary | ICD-10-CM | POA: Diagnosis not present

## 2019-03-19 DIAGNOSIS — C541 Malignant neoplasm of endometrium: Secondary | ICD-10-CM | POA: Diagnosis not present

## 2019-03-19 DIAGNOSIS — M1990 Unspecified osteoarthritis, unspecified site: Secondary | ICD-10-CM | POA: Diagnosis not present

## 2019-03-20 ENCOUNTER — Ambulatory Visit
Admission: RE | Admit: 2019-03-20 | Discharge: 2019-03-20 | Disposition: A | Discharge status: Home / Self Care | Payer: Medicare Other | Source: Ambulatory Visit | Attending: Radiation Oncology | Admitting: Radiation Oncology

## 2019-03-20 ENCOUNTER — Other Ambulatory Visit: Payer: Self-pay

## 2019-03-20 DIAGNOSIS — Z923 Personal history of irradiation: Secondary | ICD-10-CM

## 2019-03-20 DIAGNOSIS — D649 Anemia, unspecified: Secondary | ICD-10-CM | POA: Diagnosis not present

## 2019-03-20 DIAGNOSIS — G35 Multiple sclerosis: Secondary | ICD-10-CM | POA: Diagnosis not present

## 2019-03-20 DIAGNOSIS — M1990 Unspecified osteoarthritis, unspecified site: Secondary | ICD-10-CM | POA: Diagnosis not present

## 2019-03-20 DIAGNOSIS — E119 Type 2 diabetes mellitus without complications: Secondary | ICD-10-CM | POA: Diagnosis not present

## 2019-03-20 DIAGNOSIS — C541 Malignant neoplasm of endometrium: Secondary | ICD-10-CM | POA: Diagnosis not present

## 2019-03-20 DIAGNOSIS — E1142 Type 2 diabetes mellitus with diabetic polyneuropathy: Secondary | ICD-10-CM | POA: Diagnosis not present

## 2019-03-20 HISTORY — DX: Personal history of irradiation: Z92.3

## 2019-03-26 ENCOUNTER — Telehealth: Payer: Self-pay | Admitting: *Deleted

## 2019-03-26 NOTE — Telephone Encounter (Signed)
CALLED PATIENT TO REMIND OF NEW HDR [facility], LVM FOR A RETURN CALL ?

## 2019-03-27 ENCOUNTER — Ambulatory Visit
Admission: RE | Admit: 2019-03-27 | Discharge: 2019-03-27 | Disposition: A | Discharge status: Home / Self Care | Payer: Medicare Other | Source: Ambulatory Visit | Attending: Radiation Oncology | Admitting: Radiation Oncology

## 2019-03-27 ENCOUNTER — Other Ambulatory Visit: Payer: Self-pay

## 2019-03-27 ENCOUNTER — Encounter: Payer: Self-pay | Admitting: Radiation Oncology

## 2019-03-27 VITALS — BP 123/62 | HR 79 | Temp 98.0°F | Resp 20

## 2019-03-27 DIAGNOSIS — C541 Malignant neoplasm of endometrium: Secondary | ICD-10-CM

## 2019-03-27 DIAGNOSIS — E1142 Type 2 diabetes mellitus with diabetic polyneuropathy: Secondary | ICD-10-CM | POA: Diagnosis not present

## 2019-03-27 DIAGNOSIS — M1990 Unspecified osteoarthritis, unspecified site: Secondary | ICD-10-CM | POA: Diagnosis not present

## 2019-03-27 DIAGNOSIS — D649 Anemia, unspecified: Secondary | ICD-10-CM | POA: Diagnosis not present

## 2019-03-27 DIAGNOSIS — E119 Type 2 diabetes mellitus without complications: Secondary | ICD-10-CM | POA: Diagnosis not present

## 2019-03-27 DIAGNOSIS — G35 Multiple sclerosis: Secondary | ICD-10-CM | POA: Diagnosis not present

## 2019-03-27 NOTE — Progress Notes (Signed)
  Radiation Oncology         (336) [phone] ________________________________  Name: Gregg Holster MRN: [id number]  Date: 03/27/2019  DOB: 05-Mar-1961  SIMULATION AND TREATMENT PLANNING NOTE HDR BRACHYTHERAPY  DIAGNOSIS: Endometrial cancer grade 2, stage IB, pT1b, pN0(i+)   NARRATIVE:  The patient was brought to the [facility].  Identity was confirmed.  All relevant records and images related to the planned course of therapy were reviewed.  The patient freely provided informed written consent to proceed with treatment after reviewing the details related to the planned course of therapy. The consent form was witnessed and verified by the simulation staff.  Then, the patient was set-up in a stable reproducible  supine position for radiation therapy.  CT images were obtained.  Surface markings were placed.  The CT images were loaded into the planning software.  Then the target and avoidance structures were contoured.  Treatment planning then occurred.  The radiation prescription was entered and confirmed.   I have requested : Brachytherapy Isodose Plan and Dosimetry Calculations to plan the radiation distribution.    PLAN:  The patient will receive 18 Gy in 3 fractions.  Patient will be treated with a 3 cm diameter segmented cylinder with a treatment length of 3 cm.  Iridium 192 will be the high-dose-rate source.    ________________________________  Blair Promise, PhD, MD

## 2019-03-27 NOTE — Progress Notes (Signed)
  Radiation Oncology         [phone] ________________________________  Name: [name] MRN: [id number]  Date: 03/27/2019  DOB: [birth year]  CC: Forrest Moron, MD  Forrest Moron, MD  HDR BRACHYTHERAPY NOTE  DIAGNOSIS: Endometrial cancer grade 2, stage IB, pT1b, pN0(i+)   Simple treatment device note: Patient had construction of her custom vaginal cylinder. She will be treated with a 3.0 cm diameter segmented cylinder. This conforms to her anatomy without undue discomfort.  Vaginal brachytherapy procedure node: The patient was brought to the [place] suite. Identity was confirmed. All relevant records and images related to the planned course of therapy were reviewed. The patient freely provided informed written consent to proceed with treatment after reviewing the details related to the planned course of therapy. The consent form was witnessed and verified by the simulation staff. Then, the patient was set-up in a stable reproducible supine position for radiation therapy. Pelvic exam revealed the vaginal cuff to be intact . The patient's custom vaginal cylinder was placed in the proximal vagina.  Patient tolerated the placement well.  Verification simulation note:  A fiducial marker was placed within the vaginal cylinder. An AP and lateral film was then obtained through the pelvis area. This documented accurate position of the vaginal cylinder for treatment.  HDR BRACHYTHERAPY TREATMENT  The remote afterloading device was affixed to the vaginal cylinder by catheter. Patient then proceeded to undergo her first high-dose-rate treatment directed at the proximal vagina. The patient was prescribed a dose of 6.0 gray to be delivered to the mucosal surface. Treatment length was 3.0 cm. Patient was treated with 1 channel using 7 dwell positions. Treatment time was 191.8 seconds. Iridium 192 was the high-dose-rate source for treatment. The patient tolerated the treatment well. After completion of her  therapy, a radiation survey was performed documenting return of the iridium source into the [facility] safe.   PLAN: She will return next week for her second high-dose-rate treatment ________________________________  Blair Promise, PhD, MD

## 2019-03-27 NOTE — Progress Notes (Signed)
Radiation Oncology         [phone] ________________________________  Name: [name] MRN: [id number]  Date: 03/27/2019  DOB: 01-Aug-1961  Vaginal Brachytherapy Procedure Note  CC: Forrest Moron, MD Forrest Moron, MD    ICD-10-CM   1. Endometrial cancer ([place]) C54.1     Diagnosis: Endometrial cancer grade 2, stage IB, pT1b, pN0(i+)    Narrative: She returns today for vaginal cylinder fitting.  She recently completed her external beam radiation therapy.  Overall she tolerated this reasonably well given her performance status.  ALLERGIES: has No Known Allergies.  Meds: Current Outpatient Medications  Medication Sig Dispense Refill  . Adhesive Tape (PAPER TAPE 1"[id number]) TAPE Apply to dressings as needed 4 each 6  . atorvastatin (LIPITOR) 10 MG tablet Take 10 mg by mouth every evening.     . baclofen (LIORESAL) 20 MG tablet Take 1 tablet (20 mg total) by mouth 4 (four) times daily. 120 tablet 11  . Catheters (DOVER UNIVERSAL FOLEY TRAY) KIT Use foley tray kit to change catheter every 3 weeks or as needed 15 each 1  . Catheters (FOLEY CATHETER 2-WAY) MISC Use to aspirate urine suprapubic 15 each 1  . Cholecalciferol (D3-1000) 1000 units capsule Take 1,000 Units by mouth 2 (two) times daily. MORNING & AFTERNOON.    . Control Gel Formula Dressing (DUODERM CGF DRESSING) MISC Apply 1 each topically daily as needed. 5 each 11  . cyanocobalamin 500 MCG tablet Take 500 mcg by mouth daily.    . Disposable Gloves MISC Use disposable gloves to change catheter as needed 200 each 11  . furosemide (LASIX) 20 MG tablet TAKE 1 TABLET BY MOUTH ONCE DAILY 30 tablet 0  . glipiZIDE (GLUCOTROL) 5 MG tablet Take 1 tablet (5 mg total) by mouth daily before lunch. (Patient taking differently: Take 5 mg by mouth daily. ) 90 tablet 3  . hydrochlorothiazide (HYDRODIURIL) 25 MG tablet Take 25 mg by mouth daily.    . hydrocortisone (ANUSOL-HC) 2.5 % rectal cream Place 1 application rectally 2 (two)  times daily. 30 g 0  . hydrocortisone (ANUSOL-HC) 25 MG suppository Place 1 suppository (25 mg total) rectally 2 (two) times daily. 12 suppository 0  . Incontinence Supply Disposable (TENA FLEX 16 PLUS) MISC Use daily as needed 30 each 11  . Incontinence Supply Disposable (TENA PROTECT UNDERWEAR PLS/XLG) MISC Use for incontinence. 30 each 11  . Interferon Beta-1b (BETASERON) 0.3 MG KIT injection Inject 0.3 mg into the skin every other day. 36 each 3  . metFORMIN (GLUCOPHAGE) 500 MG tablet Take 500 mg by mouth 2 (two) times daily.  3  . metoCLOPramide (REGLAN) 5 MG tablet Take 1 tablet (5 mg total) by mouth 4 (four) times daily -  before meals and at bedtime. 42 tablet 0  . MOVANTIK 25 MG TABS tablet Take 25 mg by mouth every other day. IN THE MORNING.  4  . NON FORMULARY Power wheelchair repairs  Diagnosis code z99.3 1 each 0  . NON FORMULARY Dispense catheter urine drainage bags 90 Bag 3  . nystatin ointment (MYCOSTATIN) Apply to affected area twice daily as needed for irritation (Patient taking differently: 1 application. Apply to affected area twice daily as needed for irritation) 30 g 6  . Ostomy Supplies (NEW IMAGE SKIN/FLANGE/TAPE) MISC 1 application by Does not apply route daily as needed. 30 each 11  . oxyCODONE-acetaminophen (PERCOCET) 10-325 MG tablet Take 1 tablet by mouth every 8 (eight) hours as needed for  pain. Needs office visit. 90 tablet 0  . potassium chloride (K-DUR,KLOR-CON) 10 MEQ tablet TAKE 3 TABLETS BY MOUTH TWICE DAILY**OFFICE VISIT NEEDED FOR REFILLS** 30 tablet 0  . promethazine (PHENERGAN) 25 MG tablet Take 1 tablet (25 mg total) by mouth every 6 (six) hours as needed for nausea or vomiting. 45 tablet 0  . senna-docusate (SENOKOT-S) 8.6-50 MG tablet Take 2 tablets by mouth at bedtime. Do not take if having loose stools 30 tablet 1  . sulfamethoxazole-trimethoprim (BACTRIM DS,SEPTRA DS) 800-160 MG tablet Take 1 tablet by mouth 2 (two) times daily. 14 tablet 0  . Syringe,  Disposable, (B-D SYRINGE LUER-LOK 30CC) 30 ML MISC Use to change catheter 15 each 0  . Wound Dressings (GRX HYDROGEL GAUZE 4X4) PADS Use gauze to clean wound daily as needed 200 each 11   No current facility-administered medications for this encounter.     Physical Findings: The patient is in no acute distress. Patient is alert and oriented.  oral temperature is 98 F (36.7 C). Her blood pressure is 123/62 and her pulse is 79. Her respiration is 20 and oxygen saturation is 98%.   On pelvic examination A speculum exam was performed. Vaginal cuff intact, no palpable mucosal lesions. On bimanual exam there were no pelvic masses appreciated.  Lab Findings: Lab Results  Component Value Date   WBC 11.6 (H) 12/20/2018   HGB 9.2 (L) 12/20/2018   HCT 30.5 (L) 12/20/2018   MCV 85.7 12/20/2018   PLT 374 12/20/2018    Radiographic Findings: No results found.  Impression:Endometrial cancer grade 2, stage IB, pT1b, pN0(i+)n:   Patient was fitted for a vaginal cylinder. The patient will be treated with a 3. cm diameter cylinder with a treatment length of 3.0 cm. This distended the vaginal vault without undue discomfort. The patient tolerated the procedure well.  The patient was successfully fitted for a vaginal cylinder. The patient is appropriate to begin vaginal brachytherapy.   Plan: The patient will proceed with CT simulation and vaginal brachytherapy today.    _______________________________   Blair Promise, PhD, MD

## 2019-03-30 DIAGNOSIS — N318 Other neuromuscular dysfunction of bladder: Secondary | ICD-10-CM | POA: Diagnosis not present

## 2019-04-03 ENCOUNTER — Telehealth: Payer: Self-pay | Admitting: *Deleted

## 2019-04-03 NOTE — Telephone Encounter (Signed)
CALLED PATIENT'S DAUGHTER - JESSICA Monday TO REMIND OF MOM'S HDR TX. FOR 04-04-19 @ 10 AM, LVM FOR A RETURN CALL

## 2019-04-04 ENCOUNTER — Other Ambulatory Visit: Payer: Self-pay

## 2019-04-04 ENCOUNTER — Ambulatory Visit
Admission: RE | Admit: 2019-04-04 | Discharge: 2019-04-04 | Disposition: A | Discharge status: Home / Self Care | Payer: Medicare Other | Source: Ambulatory Visit | Attending: Radiation Oncology | Admitting: Radiation Oncology

## 2019-04-04 DIAGNOSIS — M1990 Unspecified osteoarthritis, unspecified site: Secondary | ICD-10-CM | POA: Diagnosis not present

## 2019-04-04 DIAGNOSIS — G35 Multiple sclerosis: Secondary | ICD-10-CM | POA: Diagnosis not present

## 2019-04-04 DIAGNOSIS — D649 Anemia, unspecified: Secondary | ICD-10-CM | POA: Diagnosis not present

## 2019-04-04 DIAGNOSIS — C541 Malignant neoplasm of endometrium: Secondary | ICD-10-CM | POA: Diagnosis not present

## 2019-04-04 DIAGNOSIS — E1142 Type 2 diabetes mellitus with diabetic polyneuropathy: Secondary | ICD-10-CM | POA: Diagnosis not present

## 2019-04-04 DIAGNOSIS — E119 Type 2 diabetes mellitus without complications: Secondary | ICD-10-CM | POA: Diagnosis not present

## 2019-04-04 NOTE — Progress Notes (Signed)
  Radiation Oncology         (336) [phone] ________________________________  Name: [name] MRN: [id number]  Date: 04/04/2019  DOB: 05-24-1961  CC: Forrest Moron, MD  Forrest Moron, MD  HDR BRACHYTHERAPY NOTE  DIAGNOSIS: Endometrial cancer grade 2, stage IB, pT1b, pN0(i+)   Simple treatment device note: Patient had construction of her custom vaginal cylinder. She will be treated with a 3.0 cm diameter segmented cylinder. This conforms to her anatomy without undue discomfort.  Vaginal brachytherapy procedure node: The patient was brought to the [place] suite. Identity was confirmed. All relevant records and images related to the planned course of therapy were reviewed. The patient freely provided informed written consent to proceed with treatment after reviewing the details related to the planned course of therapy. The consent form was witnessed and verified by the simulation staff. Then, the patient was set-up in a stable reproducible supine position for radiation therapy. Pelvic exam revealed the vaginal cuff to be intact . The patient's custom vaginal cylinder was placed in the proximal vagina.  Patient tolerated the placement well.  Verification simulation note:  A fiducial marker was placed within the vaginal cylinder. An AP  was then obtained through the pelvis area. This documented accurate position of the vaginal cylinder for treatment.  HDR BRACHYTHERAPY TREATMENT  The remote afterloading device was affixed to the vaginal cylinder by catheter. Patient then proceeded to undergo her second high-dose-rate treatment directed at the proximal vagina. The patient was prescribed a dose of 6.0 gray to be delivered to the mucosal surface. Treatment length was 3.0 cm. Patient was treated with 1 channel using 7 dwell positions. Treatment time was 207.0 seconds. Iridium 192 was the high-dose-rate source for treatment. The patient tolerated the treatment well. After completion of her therapy, a  radiation survey was performed documenting return of the iridium source into the [facility] safe.   PLAN: She will return next week for her third and final high-dose-rate treatment. ________________________________  Blair Promise, PhD, MD  This document serves as a record of services personally performed by Gery Pray, MD. It was created on his behalf by Mary-Margaret Loma Messing, a trained medical scribe. The creation of this record is based on the scribe's personal observations and the provider's statements to them. This document has been checked and approved by the attending provider.

## 2019-04-05 ENCOUNTER — Other Ambulatory Visit: Payer: Self-pay | Admitting: Family Medicine

## 2019-04-06 NOTE — Telephone Encounter (Signed)
Please review

## 2019-04-09 ENCOUNTER — Telehealth: Payer: Self-pay | Admitting: Family Medicine

## 2019-04-09 NOTE — Telephone Encounter (Unsigned)
Copied from [place] [phone]. Topic: Quick Communication - Rx Refill/Question >> Apr 09, 2019  5:52 PM Waylan Rocher, [place] wrote: Medication: oxyCODONE-acetaminophen (PERCOCET) 10-325 MG tablet  Has the patient contacted their pharmacy? {yes (Agent: If no, request that the patient contact the pharmacy for the refill.) (Agent: If yes, when and what did the pharmacy advise?)  Preferred Pharmacy (with phone number or street name): [place], [facility] [address] [place] [address] Phone: [phone] Fax: [phone]  Agent: Please be advised that RX refills may take up to 3 business days. We ask that you follow-up with your pharmacy.

## 2019-04-10 ENCOUNTER — Ambulatory Visit: Payer: Medicare Other | Admitting: Radiation Oncology

## 2019-04-10 ENCOUNTER — Telehealth: Payer: Self-pay | Admitting: Family Medicine

## 2019-04-10 ENCOUNTER — Other Ambulatory Visit: Payer: Self-pay | Admitting: Family Medicine

## 2019-04-10 ENCOUNTER — Telehealth: Payer: Self-pay | Admitting: *Deleted

## 2019-04-10 DIAGNOSIS — G35 Multiple sclerosis: Secondary | ICD-10-CM

## 2019-04-10 DIAGNOSIS — Z79891 Long term (current) use of opiate analgesic: Secondary | ICD-10-CM

## 2019-04-10 MED ORDER — OXYCODONE-ACETAMINOPHEN 10-325 MG PO TABS: 1.0000 | ORAL_TABLET | Freq: Three times a day (TID) | ORAL | 0 refills | Status: DC | PRN

## 2019-04-10 NOTE — Telephone Encounter (Signed)
Sent message to Dr. Kaleen Mask about refill

## 2019-04-10 NOTE — Telephone Encounter (Signed)
CALLED PATIENT TO REMIND OF [facility] [place] 04-11-19 @ 10 AM, SPOKE WITH PATIENT AND SHE IS AWARE OF THIS [name].

## 2019-04-10 NOTE — Telephone Encounter (Signed)
Copied from [place] [phone]. Topic: General - Inquiry >> Apr 10, 2019  4:02 PM Richardo Priest, [place] wrote: Bretta Bang, from [facility], called in stating she would like some clarification on a medication, oxyCODONE-acetaminophen (PERCOCET) 10-325 MG tablet, that was called in for a patient. Call back is [phone]

## 2019-04-10 NOTE — Telephone Encounter (Signed)
Called pharmacy and clarified medication refill.

## 2019-04-10 NOTE — Telephone Encounter (Signed)
Spoke with pt and informed her Dr.Stalling will be sending her medication into the pharmacy, she verbalized understanding.

## 2019-04-11 ENCOUNTER — Ambulatory Visit
Admission: RE | Admit: 2019-04-11 | Discharge: 2019-04-11 | Disposition: A | Discharge status: Home / Self Care | Payer: Medicare Other | Source: Ambulatory Visit | Attending: Radiation Oncology | Admitting: Radiation Oncology

## 2019-04-11 ENCOUNTER — Other Ambulatory Visit: Payer: Self-pay

## 2019-04-11 ENCOUNTER — Encounter: Payer: Self-pay | Admitting: Radiation Oncology

## 2019-04-11 DIAGNOSIS — M1990 Unspecified osteoarthritis, unspecified site: Secondary | ICD-10-CM | POA: Diagnosis not present

## 2019-04-11 DIAGNOSIS — Z90710 Acquired absence of both cervix and uterus: Secondary | ICD-10-CM | POA: Diagnosis not present

## 2019-04-11 DIAGNOSIS — E119 Type 2 diabetes mellitus without complications: Secondary | ICD-10-CM | POA: Insufficient documentation

## 2019-04-11 DIAGNOSIS — C541 Malignant neoplasm of endometrium: Secondary | ICD-10-CM | POA: Diagnosis not present

## 2019-04-11 DIAGNOSIS — D649 Anemia, unspecified: Secondary | ICD-10-CM | POA: Diagnosis not present

## 2019-04-11 DIAGNOSIS — Z90722 Acquired absence of ovaries, bilateral: Secondary | ICD-10-CM | POA: Insufficient documentation

## 2019-04-11 DIAGNOSIS — E782 Mixed hyperlipidemia: Secondary | ICD-10-CM | POA: Insufficient documentation

## 2019-04-11 DIAGNOSIS — E1142 Type 2 diabetes mellitus with diabetic polyneuropathy: Secondary | ICD-10-CM | POA: Diagnosis not present

## 2019-04-11 DIAGNOSIS — G35 Multiple sclerosis: Secondary | ICD-10-CM | POA: Insufficient documentation

## 2019-04-11 DIAGNOSIS — Z79899 Other long term (current) drug therapy: Secondary | ICD-10-CM | POA: Diagnosis not present

## 2019-04-11 NOTE — Progress Notes (Signed)
  Radiation Oncology         (336) [phone] ________________________________  Name: [name] MRN: [id number]  Date: 04/11/2019  DOB: 06-16-1961  End of Treatment Note  Diagnosis:   Endometrial cancer grade 2, stage IB, pT1b, pN0(i+)      Indication for treatment:  Curative       Radiation treatment dates:   1. IMRT: 02/14/19-03/20/19      2. HDR Ir-192: 03/27/19, 04/04/19, 04/11/19   Site/dose: 1. Pelvis; 25 fractions of 1.8 Gy for a total of 45 Gy         2. Vagina, 6 Gy in 3 fractions for a total dose of 18 Gy  Beams/energy:   1.Photon IMRT; 6X        2. HDR Ir-Vaginal, Iridium HDR, patient was treated with a 3 cm diameter segmented cylinder, Rx length of 3 cm, prescription was 6 gray to the mucosal surface  Narrative: The patient tolerated radiation treatment relatively well.     At the beginning of treatment, pt reported mild fatigue and nausea which quickly resolved. Pt denied urethral burning/itching, vaginal bleeding/discharge, and rectal bleeding throughout treatments. Towards the end of treatment, pt reported mild diarrhea, somewhat controlled with Immodium. She did not develop any skin issues. Overall the pt was without complaints.   Plan: The patient has completed radiation treatment. The patient will return to radiation oncology clinic for routine followup in one month. I advised them to call or return sooner if they have any questions or concerns related to their recovery or treatment.  -----------------------------------  Blair Promise, PhD, MD  This document serves as a record of services personally performed by Gery Pray, MD. It was created on his behalf by Mary-Margaret Loma Messing, a trained medical scribe. The creation of this record is based on the scribe's personal observations and the provider's statements to them. This document has been checked and approved by the attending provider.

## 2019-04-11 NOTE — Progress Notes (Signed)
  Radiation Oncology         [phone] ________________________________  Name: [name] MRN: [id number]  Date: 04/11/2019  DOB: 04-Oct-1961  CC: Forrest Moron, MD  Forrest Moron, MD  HDR BRACHYTHERAPY NOTE  DIAGNOSIS: Endometrial cancer grade 2, stage IB, pT1b, pN0(i+)   Simple treatment device note: Patient had construction of her custom vaginal cylinder. She will be treated with a 3.0 cm diameter segmented cylinder. This conforms to her anatomy without undue discomfort.  Vaginal brachytherapy procedure node: The patient was brought to the [place] suite. Identity was confirmed. All relevant records and images related to the planned course of therapy were reviewed. The patient freely provided informed written consent to proceed with treatment after reviewing the details related to the planned course of therapy. The consent form was witnessed and verified by the simulation staff. Then, the patient was set-up in a stable reproducible supine position for radiation therapy. Pelvic exam revealed the vaginal cuff to be intact . The patient's custom vaginal cylinder was placed in the proximal vagina.  Patient tolerated the placement well.  Verification simulation note:  A fiducial marker was placed within the vaginal cylinder. An AP  was then obtained through the pelvis area. This documented accurate position of the vaginal cylinder for treatment.  HDR BRACHYTHERAPY TREATMENT  The remote afterloading device was affixed to the vaginal cylinder by catheter. Patient then proceeded to undergo her third high-dose-rate treatment directed at the proximal vagina. The patient was prescribed a dose of 6.0 gray to be delivered to the mucosal surface. Treatment length was 3.0 cm. Patient was treated with 1 channel using 7 dwell positions. Treatment time was 221.0 seconds. Iridium 192 was the high-dose-rate source for treatment. The patient tolerated the treatment well. After completion of her therapy, a  radiation survey was performed documenting return of the iridium source into the [facility] safe.   PLAN: She completed her third and final HDR treatment today. Follow-up in one month, possibly a phone interview. Vaginal dilators given; daughter maybe able to place concerning this issue. ________________________________  Blair Promise, PhD, MD  This document serves as a record of services personally performed by Gery Pray, MD. It was created on his behalf by Mary-Margaret Loma Messing, a trained medical scribe. The creation of this record is based on the scribe's personal observations and the provider's statements to them. This document has been checked and approved by the attending provider.

## 2019-04-13 ENCOUNTER — Other Ambulatory Visit: Payer: Self-pay | Admitting: Urology

## 2019-04-13 ENCOUNTER — Encounter (HOSPITAL_BASED_OUTPATIENT_CLINIC_OR_DEPARTMENT_OTHER): Payer: Medicare Other | Attending: Internal Medicine

## 2019-04-13 DIAGNOSIS — L89313 Pressure ulcer of right buttock, stage 3: Secondary | ICD-10-CM | POA: Insufficient documentation

## 2019-04-13 DIAGNOSIS — L89312 Pressure ulcer of right buttock, stage 2: Secondary | ICD-10-CM | POA: Diagnosis not present

## 2019-04-13 DIAGNOSIS — E1140 Type 2 diabetes mellitus with diabetic neuropathy, unspecified: Secondary | ICD-10-CM | POA: Diagnosis not present

## 2019-04-13 DIAGNOSIS — I10 Essential (primary) hypertension: Secondary | ICD-10-CM | POA: Insufficient documentation

## 2019-04-13 DIAGNOSIS — L89329 Pressure ulcer of left buttock, unspecified stage: Secondary | ICD-10-CM | POA: Diagnosis not present

## 2019-04-13 MED ORDER — ONABOTULINUMTOXINA 100 UNITS IJ SOLR: 200.0000 [IU] | Freq: Once | INTRAMUSCULAR | Status: DC

## 2019-04-20 DIAGNOSIS — N3020 Other chronic cystitis without hematuria: Secondary | ICD-10-CM | POA: Diagnosis not present

## 2019-04-20 DIAGNOSIS — N318 Other neuromuscular dysfunction of bladder: Secondary | ICD-10-CM | POA: Diagnosis not present

## 2019-05-02 ENCOUNTER — Ambulatory Visit (INDEPENDENT_AMBULATORY_CARE_PROVIDER_SITE_OTHER): Payer: Medicare Other | Admitting: Podiatry

## 2019-05-02 ENCOUNTER — Encounter: Payer: Self-pay | Admitting: Podiatry

## 2019-05-02 ENCOUNTER — Other Ambulatory Visit: Payer: Self-pay

## 2019-05-02 VITALS — Temp 97.0°F

## 2019-05-02 DIAGNOSIS — E1142 Type 2 diabetes mellitus with diabetic polyneuropathy: Secondary | ICD-10-CM | POA: Diagnosis not present

## 2019-05-02 DIAGNOSIS — B351 Tinea unguium: Secondary | ICD-10-CM | POA: Diagnosis not present

## 2019-05-02 DIAGNOSIS — L84 Corns and callosities: Secondary | ICD-10-CM

## 2019-05-02 DIAGNOSIS — G35 Multiple sclerosis: Secondary | ICD-10-CM | POA: Diagnosis not present

## 2019-05-02 DIAGNOSIS — M79676 Pain in unspecified toe(s): Secondary | ICD-10-CM | POA: Diagnosis not present

## 2019-05-02 NOTE — Patient Instructions (Addendum)
Diabetes Mellitus and Foot Care Foot care is an important part of your health, especially when you have diabetes. Diabetes may cause you to have problems because of poor blood flow (circulation) to your feet and legs, which can cause your skin to:  Become thinner and drier.  Break more easily.  Heal more slowly.  Peel and crack. You may also have nerve damage (neuropathy) in your legs and feet, causing decreased feeling in them. This means that you may not notice minor injuries to your feet that could lead to more serious problems. Noticing and addressing any potential problems early is the best way to prevent future foot problems. How to care for your feet Foot hygiene  Wash your feet daily with warm water and mild soap. Do not use hot water. Then, pat your feet and the areas between your toes until they are completely dry. Do not soak your feet as this can dry your skin.  Trim your toenails straight across. Do not dig under them or around the cuticle. File the edges of your nails with an emery board or nail file.  Apply a moisturizing lotion or petroleum jelly to the skin on your feet and to dry, brittle toenails. Use lotion that does not contain alcohol and is unscented. Do not apply lotion between your toes. Shoes and socks  Wear clean socks or stockings every day. Make sure they are not too tight. Do not wear knee-high stockings since they may decrease blood flow to your legs.  Wear shoes that fit properly and have enough cushioning. Always look in your shoes before you put them on to be sure there are no objects inside.  To break in new shoes, wear them for just a few hours a day. This prevents injuries on your feet. Wounds, scrapes, corns, and calluses  Check your feet daily for blisters, cuts, bruises, sores, and redness. If you cannot see the bottom of your feet, use a mirror or ask someone for help.  Do not cut corns or calluses or try to remove them with medicine.  If you  find a minor scrape, cut, or break in the skin on your feet, keep it and the skin around it clean and dry. You may clean these areas with mild soap and water. Do not clean the area with peroxide, alcohol, or iodine.  If you have a wound, scrape, corn, or callus on your foot, look at it several times a day to make sure it is healing and not infected. Check for: ? Redness, swelling, or pain. ? Fluid or blood. ? Warmth. ? Pus or a bad smell. General instructions  Do not cross your legs. This may decrease blood flow to your feet.  Do not use heating pads or hot water bottles on your feet. They may burn your skin. If you have lost feeling in your feet or legs, you may not know this is happening until it is too late.  Protect your feet from hot and cold by wearing shoes, such as at the beach or on hot pavement.  Schedule a complete foot exam at least once a year (annually) or more often if you have foot problems. If you have foot problems, report any cuts, sores, or bruises to your health care provider immediately. Contact a health care provider if:  You have a medical condition that increases your risk of infection and you have any cuts, sores, or bruises on your feet.  You have an injury that is not   healing.  You have redness on your legs or feet.  You feel burning or tingling in your legs or feet.  You have pain or cramps in your legs and feet.  Your legs or feet are numb.  Your feet always feel cold.  You have pain around a toenail. Get help right away if:  You have a wound, scrape, corn, or callus on your foot and: ? You have pain, swelling, or redness that gets worse. ? You have fluid or blood coming from the wound, scrape, corn, or callus. ? Your wound, scrape, corn, or callus feels warm to the touch. ? You have pus or a bad smell coming from the wound, scrape, corn, or callus. ? You have a fever. ? You have a red line going up your leg. Summary  Check your feet every day  for cuts, sores, red spots, swelling, and blisters.  Moisturize feet and legs daily.  Wear shoes that fit properly and have enough cushioning.  If you have foot problems, report any cuts, sores, or bruises to your health care provider immediately.  Schedule a complete foot exam at least once a year (annually) or more often if you have foot problems. This information is not intended to replace advice given to you by your health care provider. Make sure you discuss any questions you have with your health care provider. Document Released: 11/19/2000 Document Revised: 01/04/2018 Document Reviewed: 12/24/2016 Else[employer] Interactive Patient Education  2019 [employer]  Diabetic Neuropathy Diabetic neuropathy refers to nerve damage that is caused by diabetes (diabetes mellitus). Over time, people with diabetes can develop nerve damage throughout the body. There are several types of diabetic neuropathy:  Peripheral neuropathy. This is the most common type of diabetic neuropathy. It causes damage to nerves that carry signals between the spinal cord and other parts of the body (peripheral nerves). This usually affects nerves in the feet and legs first, and may eventually affect the hands and arms. The damage affects the ability to sense touch or temperature.  Autonomic neuropathy. This type causes damage to nerves that control involuntary functions (autonomic nerves). These nerves carry signals that control: ? Heartbeat. ? Body temperature. ? Blood pressure. ? Urination. ? Digestion. ? Sweating. ? Sexual function. ? Response to changing blood sugar (glucose) levels.  Focal neuropathy. This type of nerve damage affects one area of the body, such as an arm, a leg, or the face. The injury may involve one nerve or a small group of nerves. Focal neuropathy can be painful and unpredictable, and occurs most often in older adults with diabetes. This often develops suddenly, but usually improves over time  and does not cause long-term problems.  Proximal neuropathy. This type of nerve damage affects the nerves of the thighs, hips, buttocks, or legs. It causes severe pain, weakness, and muscle death (atrophy), usually in the thigh muscles. It is more common among older men and people who have type 2 diabetes. The length of recovery time may vary. What are the causes? Peripheral, autonomic, and focal neuropathies are caused by diabetes that is not well controlled with treatment. The cause of proximal neuropathy is not known, but it may be caused by inflammation related to uncontrolled blood glucose levels. What are the signs or symptoms? Peripheral neuropathy Peripheral neuropathy develops slowly over time. When the nerves of the feet and legs no longer work, you may experience:  Burning, stabbing, or aching pain in the legs or feet.  Pain or cramping in the  legs or feet.  Loss of feeling (numbness) and inability to feel pressure or pain in the feet. This can lead to: ? Thick calluses or sores on areas of constant pressure. ? Ulcers. ? Reduced ability to feel temperature changes.  Foot deformities.  Muscle weakness.  Loss of balance or coordination. Autonomic neuropathy The symptoms of autonomic neuropathy vary depending on which nerves are affected. Symptoms may include:  Problems with digestion, such as: ? Nausea or vomiting. ? Poor appetite. ? Bloating. ? Diarrhea or constipation. ? Trouble swallowing. ? Losing weight without trying to.  Problems with the heart, blood and lungs, such as: ? Dizziness, especially when standing up. ? Fainting. ? Shortness of breath. ? Irregular heartbeat.  Bladder problems, such as: ? Trouble starting or stopping urination. ? Leaking urine. ? Trouble emptying the bladder. ? Urinary tract infections (UTIs).  Problems with other body functions, such as: ? Sweat. You may sweat too much or too little. ? Temperature. You might get hot easily.  Or, you might feel cold more than usual. ? Sexual function. Men may not be able to get or maintain an erection. Women may have vaginal dryness and difficulty with arousal. Focal neuropathy Symptoms affect only one area of the body. Common symptoms include:  Numbness.  Tingling.  Burning pain.  Prickling feeling.  Very sensitive skin.  Weakness.  Inability to move (paralysis).  Muscle twitching.  Muscles getting smaller (wasting).  Poor coordination.  Double or blurred vision. Proximal neuropathy  Sudden, severe pain in the hip, thigh, or buttocks. Pain may spread from the back into the legs (sciatica).  Pain and numbness in the arms and legs.  Tingling.  Loss of bladder control or bowel control.  Weakness and wasting of thigh muscles.  Difficulty getting up from a seated position.  Abdominal swelling.  Unexplained weight loss. How is this diagnosed? Diagnosis usually involves reviewing your medical history and any symptoms you have. Diagnosis varies depending on the type of neuropathy your health care provider suspects. Peripheral neuropathy Your health care provider will check areas that are affected by your nervous system (neurologic exam), such as your reflexes, how you move, and what you can feel. You may have other tests, such as:  Blood tests.  Removal and examination of fluid that surrounds the spinal cord (lumbar puncture).  CT scan.  MRI.  A test to check the nerves that control muscles (electromyogram, EMG).  Tests of how quickly messages pass through your nerves (nerve conduction velocity tests).  Removal of a small piece of nerve to be examined under a microscope (biopsy). Autonomic neuropathy You may have tests, such as:  Tests to measure your blood pressure and heart rate. This may include monitoring you while you are safely secured to an exam table that moves you from a lying position to an upright position (table tilt test).  Breathing  tests to check your lungs.  Tests to check how food moves through the digestive system (gastric emptying tests).  Blood, sweat, or urine tests.  Ultrasound of your bladder.  Spinal fluid tests. Focal neuropathy This condition may be diagnosed with:  A neurologic exam.  CT scan.  MRI.  EMG.  Nerve conduction velocity tests. Proximal neuropathy There is no test to diagnose this type of neuropathy. You may have tests to rule out other possible causes of this type of neuropathy. Tests may include:  X-rays of your spine and lumbar region.  Lumbar puncture.  MRI. How is this treated? The  goal of treatment is to keep nerve damage from getting worse. The most important part of treatment is keeping your blood glucose level and your A1C level within your target range by following your diabetes management plan. Over time, maintaining lower blood glucose levels helps lessen symptoms. In some cases, you may need prescription pain medicine. Follow these instructions at home:  Lifestyle   Do not use any products that contain nicotine or tobacco, such as cigarettes and e-cigarettes. If you need help quitting, ask your health care provider.  Be physically active every day. Include strength training and balance exercises.  Follow a healthy meal plan.  Work with your health care provider to manage your blood pressure. General instructions  Follow your diabetes management plan as directed. ? Check your blood glucose levels as directed by your health care provider. ? Keep your blood glucose in your target range as directed by your health care provider. ? Have your A1C level checked at least two times a year, or as often as told by your health care provider.  Take over the counter and prescription medicines only as told by your health care provider. This includes insulin and diabetes medicine.  Do not drive or use heavy machinery while taking prescription pain medicines.  Check your  skin and feet every day for cuts, bruises, redness, blisters, or sores.  Keep all follow up visits as told by your health care provider. This is important. Contact a health care provider if:  You have burning, stabbing, or aching pain in your legs or feet.  You are unable to feel pressure or pain in your feet.  You develop problems with digestion, such as: ? Nausea. ? Vomiting. ? Bloating. ? Constipation. ? Diarrhea. ? Abdominal pain.  You have difficulty with urination, such as inability: ? To control when you urinate (incontinence). ? To completely empty the bladder (retention).  You have palpitations.  You feel dizzy, weak, or faint when you stand up. Get help right away if:  You cannot urinate.  You have sudden weakness or loss of coordination.  You have trouble speaking.  You have pain or pressure in your chest.  You have an irregular heart beat.  You have sudden inability to move a part of your body. Summary  Diabetic neuropathy refers to nerve damage that is caused by diabetes. It can affect nerves throughout the entire body, causing numbness and pain in the arms, legs, digestive tract, heart, and other body systems.  Keep your blood glucose level and your blood pressure in your target range, as directed by your health care provider. This can help prevent neuropathy from getting worse.  Check your skin and feet every day for cuts, bruises, redness, blisters, or sores.  Do not use any products that contain nicotine or tobacco, such as cigarettes and e-cigarettes. If you need help quitting, ask your health care provider. This information is not intended to replace advice given to you by your health care provider. Make sure you discuss any questions you have with your health care provider. Document Released: 01/31/2002 Document Revised: 01/04/2018 Document Reviewed: 12/27/2016 Elsevier Interactive Patient Education  2019 [employer] Onychomycosis/Fungal  Toenails  WHAT IS IT? An infection that lies within the keratin of your nail plate that is caused by a fungus.  WHY ME? Fungal infections affect all ages, sexes, races, and creeds.  There may be many factors that predispose you to a fungal infection such as age, coexisting medical conditions such as diabetes, or   an autoimmune disease; stress, medications, fatigue, genetics, etc.  Bottom line: fungus thrives in a warm, moist environment and your shoes offer such a location.  IS IT CONTAGIOUS? Theoretically, yes.  You do not want to share shoes, nail clippers or files with someone who has fungal toenails.  Walking around barefoot in the same room or sleeping in the same bed is unlikely to transfer the organism.  It is important to realize, however, that fungus can spread easily from one nail to the next on the same foot.  HOW DO WE TREAT THIS?  There are several ways to treat this condition.  Treatment may depend on many factors such as age, medications, pregnancy, liver and kidney conditions, etc.  It is best to ask your doctor which options are available to you.  No treatment.   Unlike many other medical concerns, you can live with this condition.  However for many people this can be a painful condition and may lead to ingrown toenails or a bacterial infection.  It is recommended that you keep the nails cut short to help reduce the amount of fungal nail. Topical treatment.  These range from herbal remedies to prescription strength nail lacquers.  About 40-50% effective, topicals require twice daily application for approximately 9 to 12 months or until an entirely new nail has grown out.  The most effective topicals are medical grade medications available through physicians offices. Oral antifungal medications.  With an 80-90% cure rate, the most common oral medication requires 3 to 4 months of therapy and stays in your system for a year as the new nail grows out.  Oral antifungal medications do require  blood work to make sure it is a safe drug for you.  A liver function panel will be performed prior to starting the medication and after the first month of treatment.  It is important to have the blood work performed to avoid any harmful side effects.  In general, this medication safe but blood work is required. Laser Therapy.  This treatment is performed by applying a specialized laser to the affected nail plate.  This therapy is noninvasive, fast, and non-painful.  It is not covered by insurance and is therefore, out of pocket.  The results have been very good with a 80-95% cure rate.  [facility] is the only practice in the area to offer this therapy. Permanent Nail Avulsion.  Removing the entire nail so that a new nail will not grow back.  Corns and Calluses Corns are small areas of thickened skin that occur on the top, sides, or tip of a toe. They contain a cone-shaped core with a point that can press on a nerve below. This causes pain.  Calluses are areas of thickened skin that can occur anywhere on the body, including the hands, fingers, palms, soles of the feet, and heels. Calluses are usually larger than corns. What are the causes? Corns and calluses are caused by rubbing (friction) or pressure, such as from shoes that are too tight or do not fit properly. What increases the risk? Corns are more likely to develop in people who have misshapen toes (toe deformities), such as hammer toes. Calluses can occur with friction to any area of the skin. They are more likely to develop in people who:  Work with their hands.  Wear shoes that fit poorly, are too tight, or are high-heeled.  Have toe deformities. What are the signs or symptoms? Symptoms of a corn or callus  include:  A hard growth on the skin.  Pain or tenderness under the skin.  Redness and swelling.  Increased discomfort while wearing tight-fitting shoes, if your feet are affected. If a corn or callus becomes infected,  symptoms may include:  Redness and swelling that gets worse.  Pain.  Fluid, blood, or pus draining from the corn or callus. How is this diagnosed? Corns and calluses may be diagnosed based on your symptoms, your medical history, and a physical exam. How is this treated? Treatment for corns and calluses may include:  Removing the cause of the friction or pressure. This may involve: ? Changing your shoes. ? Wearing shoe inserts (orthotics) or other protective layers in your shoes, such as a corn pad. ? Wearing gloves.  Applying medicine to the skin (topical medicine) to help soften skin in the hardened, thickened areas.  Removing layers of dead skin with a file to reduce the size of the corn or callus.  Removing the corn or callus with a scalpel or laser.  Taking antibiotic medicines, if your corn or callus is infected.  Having surgery, if a toe deformity is the cause. Follow these instructions at home:   Take over-the-counter and prescription medicines only as told by your health care provider.  If you were prescribed an antibiotic, take it as told by your health care provider. Do not stop taking it even if your condition starts to improve.  Wear shoes that fit well. Avoid wearing high-heeled shoes and shoes that are too tight or too loose.  Wear any padding, protective layers, gloves, or orthotics as told by your health care provider.  Soak your hands or feet and then use a file or pumice stone to soften your corn or callus. Do this as told by your health care provider.  Check your corn or callus every day for symptoms of infection. Contact a health care provider if you:  Notice that your symptoms do not improve with treatment.  Have redness or swelling that gets worse.  Notice that your corn or callus becomes painful.  Have fluid, blood, or pus coming from your corn or callus.  Have new symptoms. Summary  Corns are small areas of thickened skin that occur on the  top, sides, or tip of a toe.  Calluses are areas of thickened skin that can occur anywhere on the body, including the hands, fingers, palms, and soles of the feet. Calluses are usually larger than corns.  Corns and calluses are caused by rubbing (friction) or pressure, such as from shoes that are too tight or do not fit properly.  Treatment may include wearing any padding, protective layers, gloves, or orthotics as told by your health care provider. This information is not intended to replace advice given to you by your health care provider. Make sure you discuss any questions you have with your health care provider. Document Released: 08/28/2004 Document Revised: 10/05/2017 Document Reviewed: 10/05/2017 [employer]vier Interactive Patient Education  2019 [employer].

## 2019-05-06 NOTE — Progress Notes (Signed)
Subjective: [name] presents today with history of multiple sclerosis and diabetic neuropathy  Patient seen for preventative foot care.  She has mycotic toenails plantar callus left foot which pose a risk due to her diabetes.   Forrest Moron, MD is her PCP.  Last visit Apr 10, 2019.   Current Outpatient Medications:  .  Adhesive Tape (PAPER TAPE 1"X10YD) TAPE, Apply to dressings as needed, Disp: 4 each, Rfl: 6 .  atorvastatin (LIPITOR) 10 MG tablet, Take 10 mg by mouth every evening. , Disp: , Rfl:  .  baclofen (LIORESAL) 20 MG tablet, Take 1 tablet (20 mg total) by mouth 4 (four) times daily., Disp: 120 tablet, Rfl: 11 .  Catheters (DOVER UNIVERSAL FOLEY TRAY) KIT, Use foley tray kit to change catheter every 3 weeks or as needed, Disp: 15 each, Rfl: 1 .  Catheters (FOLEY CATHETER 2-WAY) MISC, Use to aspirate urine suprapubic, Disp: 15 each, Rfl: 1 .  Cholecalciferol (D3-1000) 1000 units capsule, Take 1,000 Units by mouth 2 (two) times daily. MORNING & AFTERNOON., Disp: , Rfl:  .  Control Gel Formula Dressing (DUODERM CGF DRESSING) MISC, Apply 1 each topically daily as needed., Disp: 5 each, Rfl: 11 .  cyanocobalamin 500 MCG tablet, Take 500 mcg by mouth daily., Disp: , Rfl:  .  Disposable Gloves MISC, Use disposable gloves to change catheter as needed, Disp: 200 each, Rfl: 11 .  furosemide (LASIX) 20 MG tablet, TAKE 1 TABLET BY MOUTH ONCE DAILY, Disp: 30 tablet, Rfl: 0 .  glipiZIDE (GLUCOTROL) 5 MG tablet, Take 1 tablet (5 mg total) by mouth daily before lunch. (Patient taking differently: Take 5 mg by mouth daily. ), Disp: 90 tablet, Rfl: 3 .  hydrochlorothiazide (HYDRODIURIL) 25 MG tablet, Take 25 mg by mouth daily., Disp: , Rfl:  .  hydrocortisone (ANUSOL-HC) 2.5 % rectal cream, Place 1 application rectally 2 (two) times daily., Disp: 30 g, Rfl: 0 .  hydrocortisone (ANUSOL-HC) 25 MG suppository, Place 1 suppository (25 mg total) rectally 2 (two) times daily., Disp: 12 suppository, Rfl:  0 .  Incontinence Supply Disposable (TENA FLEX 16 PLUS) MISC, Use daily as needed, Disp: 30 each, Rfl: 11 .  Incontinence Supply Disposable (TENA PROTECT UNDERWEAR PLS/XLG) MISC, Use for incontinence., Disp: 30 each, Rfl: 11 .  Interferon Beta-1b (BETASERON) 0.3 MG KIT injection, Inject 0.3 mg into the skin every other day., Disp: 36 each, Rfl: 3 .  metFORMIN (GLUCOPHAGE) 500 MG tablet, Take 500 mg by mouth 2 (two) times daily., Disp: , Rfl: 3 .  metoCLOPramide (REGLAN) 5 MG tablet, Take 1 tablet (5 mg total) by mouth 4 (four) times daily -  before meals and at bedtime., Disp: 42 tablet, Rfl: 0 .  MOVANTIK 25 MG TABS tablet, Take 25 mg by mouth every other day. IN THE MORNING., Disp: , Rfl: 4 .  NON FORMULARY, Power wheelchair repairs  Diagnosis code z99.3, Disp: 1 each, Rfl: 0 .  NON FORMULARY, Dispense catheter urine drainage bags, Disp: 90 Bag, Rfl: 3 .  nystatin ointment (MYCOSTATIN), Apply to affected area twice daily as needed for irritation (Patient taking differently: 1 application. Apply to affected area twice daily as needed for irritation), Disp: 30 g, Rfl: 6 .  Ostomy Supplies (NEW IMAGE SKIN/FLANGE/TAPE) MISC, 1 application by Does not apply route daily as needed., Disp: 30 each, Rfl: 11 .  oxyCODONE-acetaminophen (PERCOCET) 10-325 MG tablet, Take 1 tablet by mouth every 8 (eight) hours as needed for pain. Needs office visit., Disp:  90 tablet, Rfl: 0 .  potassium chloride (K-DUR) 10 MEQ tablet, TAKE 3 TABLETS BY MOUTH TWICE DAILY**OFFICE VISIT NEEDED FOR REFILLS**, Disp: 30 tablet, Rfl: 0 .  promethazine (PHENERGAN) 25 MG tablet, Take 1 tablet (25 mg total) by mouth every 6 (six) hours as needed for nausea or vomiting., Disp: 45 tablet, Rfl: 0 .  senna-docusate (SENOKOT-S) 8.6-50 MG tablet, Take 2 tablets by mouth at bedtime. Do not take if having loose stools, Disp: 30 tablet, Rfl: 1 .  sulfamethoxazole-trimethoprim (BACTRIM DS,SEPTRA DS) 800-160 MG tablet, Take 1 tablet by mouth 2 (two)  times daily., Disp: 14 tablet, Rfl: 0 .  Syringe, Disposable, (B-D SYRINGE LUER-LOK 30CC) 30 ML MISC, Use to change catheter, Disp: 15 each, Rfl: 0 .  Wound Dressings (GRX HYDROGEL GAUZE 4X4) PADS, Use gauze to clean wound daily as needed, Disp: 200 each, Rfl: 11 No current facility-administered medications for this visit.   Facility-Administered Medications Ordered in Other Visits:  .  botulinum toxin Type A (BOTOX) injection 200 Units, 200 Units, Intramuscular, Once, Festus Aloe, MD  No Known Allergies  Objective: Vitals:   05/02/19 1320  Temp: (!) 97 F (36.1 C)    Vascular Examination: Capillary refill time less than 3 seconds x 10 digits.  Dorsalis pedis pulses present bilaterally.  Posterior tibial pulses present bilaterally.  No digital hair x 10 digits.  Skin temperature gradient within normal limits bilaterally.  Dermatological Examination: Skin with normal turgor texture and tone bilaterally.  Toenails 1-5 b/l discolored, thick, dystrophic with subungual debris and pain with palpation to nailbeds due to thickness of nails.  Hyperkeratotic lesion(s) submetatarsal head 5 left foot.Marland Kitchen No erythema, no edema, no drainage, no flocculence noted.   Musculoskeletal: Muscle strength 0/5 to all LE muscle groups bilaterally secondary to multiple sclerosis.  Patient utilizes motorized wheelchair for mobility.  Neurological: Sensation diminished with 10 gram monofilament.  Vibratory sensation diminished bilaterally.  Assessment: 1. Painful onychomycosis toenails 1-5 b/l 2. Callus submetatarsal head 5 left foot 3. Multiple sclerosis 4. NIDDM with neuropathy  Plan: 1. Toenails 1-5 b/l were debrided in length and girth without iatrogenic bleeding. 2. Calluses pared submetatarsal head(s) 5 left foot utilizing sterile scalpel blade without incident. 3. Patient to continue soft, supportive shoe gear daily. 4. Patient to report any pedal injuries to medical  professional immediately. 5. Follow up 3 months.  6. Patient/POA to call should there be a concern in the interim.

## 2019-05-11 DIAGNOSIS — N318 Other neuromuscular dysfunction of bladder: Secondary | ICD-10-CM | POA: Diagnosis not present

## 2019-05-15 ENCOUNTER — Other Ambulatory Visit: Payer: Self-pay | Admitting: Family Medicine

## 2019-05-15 ENCOUNTER — Ambulatory Visit (HOSPITAL_COMMUNITY): Admit: 2019-05-15 | Payer: Medicare Other | Admitting: Urology

## 2019-05-15 ENCOUNTER — Encounter (HOSPITAL_COMMUNITY): Payer: Self-pay

## 2019-05-15 SURGERY — CYSTOSCOPY, WITH BIOPSY
Anesthesia: General

## 2019-05-15 NOTE — Telephone Encounter (Signed)
appnt scheduled

## 2019-05-16 NOTE — Telephone Encounter (Signed)
Requested medication (s) are due for refill today: HCTZ, yes  Requested medication (s) are on the active medication list: yes  Last refill: 04/15/2019  Future visit scheduled: yes 07/26/2019  Notes to clinic: historical provider  Requested medication (s) are due for refill today: Potassium Cl, yes  Requested medication (s) are on the active medication list: yes  Last refill:  03/30/2019  Future visit scheduled: yes  Notes to clinic:  Appointment not until 07/26/2019  Requested medication (s) are due for refill today: Furosemide, yes  Requested medication (s) are on the active medication list: yes  Last refill:  03/30/2019  Future visit scheduled:yes 07/26/2019  Notes to clinic: please complete prescription signature

## 2019-05-18 ENCOUNTER — Encounter (HOSPITAL_BASED_OUTPATIENT_CLINIC_OR_DEPARTMENT_OTHER): Payer: Medicare Other | Attending: Internal Medicine

## 2019-05-18 DIAGNOSIS — G35 Multiple sclerosis: Secondary | ICD-10-CM | POA: Insufficient documentation

## 2019-05-18 DIAGNOSIS — E1140 Type 2 diabetes mellitus with diabetic neuropathy, unspecified: Secondary | ICD-10-CM | POA: Diagnosis not present

## 2019-05-18 DIAGNOSIS — L89319 Pressure ulcer of right buttock, unspecified stage: Secondary | ICD-10-CM | POA: Diagnosis not present

## 2019-05-18 DIAGNOSIS — I10 Essential (primary) hypertension: Secondary | ICD-10-CM | POA: Insufficient documentation

## 2019-05-18 DIAGNOSIS — L89313 Pressure ulcer of right buttock, stage 3: Secondary | ICD-10-CM | POA: Insufficient documentation

## 2019-05-23 ENCOUNTER — Telehealth: Payer: Self-pay | Admitting: Family Medicine

## 2019-05-23 DIAGNOSIS — G35 Multiple sclerosis: Secondary | ICD-10-CM

## 2019-05-23 DIAGNOSIS — Z79891 Long term (current) use of opiate analgesic: Secondary | ICD-10-CM

## 2019-05-23 NOTE — Telephone Encounter (Signed)
Patient is requesting a refill of the following medications: Requested Prescriptions    No prescriptions requested or ordered in this encounter   Oxycodone-acetaminophen 10-325 mg (percocet)  Date of patient request: 05/23/2019 Last office visit: 01/24/2019 Date of last refill: 04/10/2019 Last refill amount:#90 Follow up time period per chart: n/a

## 2019-05-23 NOTE — Telephone Encounter (Signed)
Please advise. Dgaddy, CMA 

## 2019-05-23 NOTE — Telephone Encounter (Signed)
Refill for oxyCODONE-acetaminophen (PERCOCET) 10-325 MG tablet    Send to:  [place], [place] [address]  Phone: [phone] Fax: [phone]

## 2019-05-23 NOTE — Telephone Encounter (Signed)
Patient is requesting a new order for a hospital bed through [facility]. The one she has is worn out.

## 2019-05-28 MED ORDER — OXYCODONE-ACETAMINOPHEN 10-325 MG PO TABS: 1.0000 | ORAL_TABLET | Freq: Three times a day (TID) | ORAL | 0 refills | Status: DC | PRN

## 2019-05-31 NOTE — Telephone Encounter (Signed)
Pt calling to check status. Pt states that he bed is broken. Please advise    Cb#[phone]

## 2019-05-31 NOTE — Telephone Encounter (Signed)
Patient would like a order for a bed. Patient called again today to check on the status for a bed

## 2019-06-01 DIAGNOSIS — N318 Other neuromuscular dysfunction of bladder: Secondary | ICD-10-CM | POA: Diagnosis not present

## 2019-06-06 NOTE — Telephone Encounter (Signed)
Patient calling to check and see if there was an update on the status of getting a hospital bed ordered. Please advise.

## 2019-06-14 ENCOUNTER — Other Ambulatory Visit: Payer: Self-pay | Admitting: Family Medicine

## 2019-06-14 NOTE — Telephone Encounter (Signed)
Sent instant message to [facility] an awaiting response back so I can contact pt. Dgaddy, CMA

## 2019-06-14 NOTE — Telephone Encounter (Signed)
Forwarding medication refills to PCP for review. 

## 2019-06-15 ENCOUNTER — Encounter (HOSPITAL_BASED_OUTPATIENT_CLINIC_OR_DEPARTMENT_OTHER): Payer: Medicare Other | Attending: Internal Medicine

## 2019-06-15 ENCOUNTER — Other Ambulatory Visit: Payer: Self-pay

## 2019-06-15 DIAGNOSIS — G35 Multiple sclerosis: Secondary | ICD-10-CM | POA: Insufficient documentation

## 2019-06-15 DIAGNOSIS — Z993 Dependence on wheelchair: Secondary | ICD-10-CM | POA: Insufficient documentation

## 2019-06-15 DIAGNOSIS — L89319 Pressure ulcer of right buttock, unspecified stage: Secondary | ICD-10-CM | POA: Diagnosis not present

## 2019-06-15 DIAGNOSIS — L89313 Pressure ulcer of right buttock, stage 3: Secondary | ICD-10-CM | POA: Diagnosis not present

## 2019-06-15 DIAGNOSIS — L89312 Pressure ulcer of right buttock, stage 2: Secondary | ICD-10-CM | POA: Diagnosis not present

## 2019-06-15 DIAGNOSIS — E1140 Type 2 diabetes mellitus with diabetic neuropathy, unspecified: Secondary | ICD-10-CM | POA: Diagnosis not present

## 2019-06-15 DIAGNOSIS — I10 Essential (primary) hypertension: Secondary | ICD-10-CM | POA: Diagnosis not present

## 2019-06-15 NOTE — Telephone Encounter (Signed)
Discussed with CMA Delores that form was put in the fax bin on Tuesday Please follow up on the status of this form. This is a high priority

## 2019-06-19 ENCOUNTER — Other Ambulatory Visit: Payer: Self-pay | Admitting: Family Medicine

## 2019-06-19 DIAGNOSIS — G35 Multiple sclerosis: Secondary | ICD-10-CM

## 2019-06-19 DIAGNOSIS — Z79891 Long term (current) use of opiate analgesic: Secondary | ICD-10-CM

## 2019-06-19 MED ORDER — OXYCODONE-ACETAMINOPHEN 10-325 MG PO TABS: 1.0000 | ORAL_TABLET | Freq: Three times a day (TID) | ORAL | 0 refills | Status: DC | PRN

## 2019-06-19 NOTE — Telephone Encounter (Signed)
Can anyone follow up to see if this fax was received?

## 2019-06-19 NOTE — Progress Notes (Signed)
Refilled pain med for fill date after 06/25/2019

## 2019-06-22 DIAGNOSIS — N318 Other neuromuscular dysfunction of bladder: Secondary | ICD-10-CM | POA: Diagnosis not present

## 2019-06-27 ENCOUNTER — Encounter: Payer: Self-pay | Admitting: Family Medicine

## 2019-06-27 ENCOUNTER — Ambulatory Visit (INDEPENDENT_AMBULATORY_CARE_PROVIDER_SITE_OTHER): Payer: Medicare Other | Admitting: Family Medicine

## 2019-06-27 ENCOUNTER — Other Ambulatory Visit: Payer: Self-pay

## 2019-06-27 VITALS — BP 121/58 | HR 63 | Temp 98.0°F | Resp 17 | Ht 67.0 in | Wt 300.0 lb

## 2019-06-27 DIAGNOSIS — C541 Malignant neoplasm of endometrium: Secondary | ICD-10-CM

## 2019-06-27 DIAGNOSIS — L909 Atrophic disorder of skin, unspecified: Secondary | ICD-10-CM

## 2019-06-27 DIAGNOSIS — Z7409 Other reduced mobility: Secondary | ICD-10-CM | POA: Diagnosis not present

## 2019-06-27 DIAGNOSIS — Z960 Presence of urogenital implants: Secondary | ICD-10-CM | POA: Diagnosis not present

## 2019-06-27 DIAGNOSIS — R238 Other skin changes: Secondary | ICD-10-CM

## 2019-06-27 DIAGNOSIS — R5381 Other malaise: Secondary | ICD-10-CM

## 2019-06-27 DIAGNOSIS — K5901 Slow transit constipation: Secondary | ICD-10-CM

## 2019-06-27 DIAGNOSIS — Z79891 Long term (current) use of opiate analgesic: Secondary | ICD-10-CM

## 2019-06-27 DIAGNOSIS — G35 Multiple sclerosis: Secondary | ICD-10-CM

## 2019-06-27 DIAGNOSIS — E1142 Type 2 diabetes mellitus with diabetic polyneuropathy: Secondary | ICD-10-CM | POA: Diagnosis not present

## 2019-06-27 DIAGNOSIS — Z978 Presence of other specified devices: Secondary | ICD-10-CM

## 2019-06-27 NOTE — Progress Notes (Signed)
Established Patient Office Visit  Subjective:  Patient ID: [name], female    DOB: [birth year]  Age: 58 y.o. MRN: [id number]  CC:  Chief Complaint  Patient presents with  . chronic medical conditions    6 month recheck  . Medication Refill    baclofen and movantik    HPI [name] presents for   Diabetes Mellitus: Patient presents for follow up of diabetes.  Patient denies foot ulcerations, hyperglycemia, increase appetite, polydipsia and polyuria.  Evaluation to date has been included: fasting blood sugar and hemoglobin A1C.  Home sugars: BGs consistently in an acceptable range. She gets fasting glucose of 140. Lab Results  Component Value Date   HGBA1C 7.0 (H) 12/12/2018   Hypertension: Patient here for follow-up of elevated blood pressure. She is not exercising and is adherent to low salt diet.  Blood pressure is well controlled at home. Cardiac symptoms none. Patient denies chest pressure/discomfort, dyspnea, exertional chest pressure/discomfort, irregular heart beat and near-syncope.  Cardiovascular risk factors: diabetes mellitus, hypertension and obesity (BMI >= 30 kg/m2). Use of agents associated with hypertension: none. History of target organ damage: none. BP Readings from Last 3 Encounters:  06/27/19 (!) 121/58  03/27/19 123/62  01/24/19 140/77   Chronic Pain Syndrome Her opiate use helps his pain She would rate his pain as 3/10 She has chronic constipation and is on a bowel regiment every 3 days She denies sleep disturbance, hallucination There isnt a history of OSA  Endometrial cancer - she has no finished her treatments  She denies any blood per vagina She has a foley catheter and sees urology often  Physical deconditioning and inability to transfer She spends most of her time in bed or in her wheelchair She has skin breakdown but it is shallow and she uses dressings to prevent worsening of the skin She denies pus She has it cleaned regularly She needs  a hoyer lift for her to get into the shower and a mesh sling that can get wet in the shower    Past Medical History:  Diagnosis Date  . Anemia    history of  . Arthritis   . Decubitus ulcers    currently treated at wound center  . Diabetes mellitus without complication ([facility])   . Diabetic polyneuropathy ([facility])   . Endometrial cancer ([facility])    Grade 2  . Fibroids   . Heartburn    Occ  . Hypertension   . Mixed hyperlipidemia   . Morbid obesity ([facility])   . MS (multiple sclerosis) ([facility])     Past Surgical History:  Procedure Laterality Date  . COLONOSCOPY    . ESOPHAGOGASTRODUODENOSCOPY N/A 12/02/2017   Procedure: ESOPHAGOGASTRODUODENOSCOPY (EGD);  Surgeon: Ronnette Juniper, MD;  Location: [facility];  Service: Gastroenterology;  Laterality: N/A;  . ROBOTIC ASSISTED TOTAL HYSTERECTOMY WITH BILATERAL SALPINGO OOPHERECTOMY Bilateral 12/19/2018   Procedure: XI ROBOTIC ASSISTED TOTAL HYSTERECTOMY (UTERUS GREATER THAN 250gr) WITH BILATERAL SALPINGO OOPHORECTOMY;  Surgeon: Everitt Amber, MD;  Location: [facility];  Service: Gynecology;  Laterality: Bilateral;  . SENTINEL NODE BIOPSY N/A 12/19/2018   Procedure: SENTINEL NODE BIOPSY;  Surgeon: Everitt Amber, MD;  Location: [facility];  Service: Gynecology;  Laterality: N/A;  . SUPRAPUBIC CATHETER INSERTION  2009  . TONSILLECTOMY    . TUBAL LIGATION      Family History  Problem Relation Age of Onset  . Cancer Mother   . Hypertension Mother   . Heart disease Father   . Diabetes Father   .  Hypertension Sister   . Hypertension Brother   . Breast cancer Neg Hx     Social History   Socioeconomic History  . Marital status: Single    Spouse name: Not on file  . Number of children: Not on file  . Years of education: Not on file  . Highest education level: Not on file  Occupational History  . Not on file  Social Needs  . Financial resource strain: Not on file  . Food insecurity    Worry: Not on file    Inability: Not on file  . Transportation  needs    Medical: Not on file    Non-medical: Not on file  Tobacco Use  . Smoking status: Never Smoker  . Smokeless tobacco: Never Used  Substance and Sexual Activity  . Alcohol use: No  . Drug use: No  . Sexual activity: Not on file  Lifestyle  . Physical activity    Days per week: Not on file    Minutes per session: Not on file  . Stress: Not on file  Relationships  . Social Herbalist on phone: Not on file    Gets together: Not on file    Attends religious service: Not on file    Active member of club or organization: Not on file    Attends meetings of clubs or organizations: Not on file    Relationship status: Not on file  . Intimate partner violence    Fear of current or ex partner: Not on file    Emotionally abused: Not on file    Physically abused: Not on file    Forced sexual activity: Not on file  Other Topics Concern  . Not on file  Social History Narrative  . Not on file    Outpatient Medications Prior to Visit  Medication Sig Dispense Refill  . Adhesive Tape (PAPER TAPE 1"X10YD) TAPE Apply to dressings as needed 4 each 6  . atorvastatin (LIPITOR) 10 MG tablet Take 10 mg by mouth every evening.     . baclofen (LIORESAL) 20 MG tablet Take 1 tablet (20 mg total) by mouth 4 (four) times daily. 120 tablet 11  . Catheters (DOVER UNIVERSAL FOLEY TRAY) KIT Use foley tray kit to change catheter every 3 weeks or as needed 15 each 1  . Catheters (FOLEY CATHETER 2-WAY) MISC Use to aspirate urine suprapubic 15 each 1  . Cholecalciferol (D3-1000) 1000 units capsule Take 1,000 Units by mouth 2 (two) times daily. MORNING & AFTERNOON.    . Control Gel Formula Dressing (DUODERM CGF DRESSING) MISC Apply 1 each topically daily as needed. 5 each 11  . cyanocobalamin 500 MCG tablet Take 500 mcg by mouth daily.    . Disposable Gloves MISC Use disposable gloves to change catheter as needed 200 each 11  . furosemide (LASIX) 20 MG tablet TAKE 1 TABLET BY MOUTH ONCE DAILY 30  tablet 0  . glipiZIDE (GLUCOTROL) 5 MG tablet Take 1 tablet (5 mg total) by mouth daily before lunch. (Patient taking differently: Take 5 mg by mouth daily. ) 90 tablet 3  . hydrochlorothiazide (HYDRODIURIL) 25 MG tablet TAKE 1 TABLET BY MOUTH EVERY MORNING  OFFICE VISIT NEEDED 90 tablet 0  . hydrocortisone (ANUSOL-HC) 2.5 % rectal cream Place 1 application rectally 2 (two) times daily. 30 g 0  . hydrocortisone (ANUSOL-HC) 25 MG suppository Place 1 suppository (25 mg total) rectally 2 (two) times daily. 12 suppository 0  . Incontinence Supply  Disposable (TENA FLEX 16 PLUS) MISC Use daily as needed 30 each 11  . Incontinence Supply Disposable (TENA PROTECT UNDERWEAR PLS/XLG) MISC Use for incontinence. 30 each 11  . Interferon Beta-1b (BETASERON) 0.3 MG KIT injection Inject 0.3 mg into the skin every other day. 36 each 3  . metFORMIN (GLUCOPHAGE) 500 MG tablet Take 500 mg by mouth 2 (two) times daily.  3  . metoCLOPramide (REGLAN) 5 MG tablet Take 1 tablet (5 mg total) by mouth 4 (four) times daily -  before meals and at bedtime. 42 tablet 0  . MOVANTIK 25 MG TABS tablet Take 25 mg by mouth every other day. IN THE MORNING.  4  . NON FORMULARY Power wheelchair repairs  Diagnosis code z99.3 1 each 0  . NON FORMULARY Dispense catheter urine drainage bags 90 Bag 3  . nystatin ointment (MYCOSTATIN) Apply to affected area twice daily as needed for irritation (Patient taking differently: 1 application. Apply to affected area twice daily as needed for irritation) 30 g 6  . Ostomy Supplies (NEW IMAGE SKIN/FLANGE/TAPE) MISC 1 application by Does not apply route daily as needed. 30 each 11  . oxyCODONE-acetaminophen (PERCOCET) 10-325 MG tablet Take 1 tablet by mouth every 8 (eight) hours as needed for pain. 90 tablet 0  . potassium chloride (K-DUR) 10 MEQ tablet TAKE 3 TABLETS BY MOUTH TWICE DAILY 30 tablet 0  . promethazine (PHENERGAN) 25 MG tablet Take 1 tablet (25 mg total) by mouth every 6 (six) hours as  needed for nausea or vomiting. 45 tablet 0  . senna-docusate (SENOKOT-S) 8.6-50 MG tablet Take 2 tablets by mouth at bedtime. Do not take if having loose stools 30 tablet 1  . sulfamethoxazole-trimethoprim (BACTRIM DS,SEPTRA DS) 800-160 MG tablet Take 1 tablet by mouth 2 (two) times daily. 14 tablet 0  . Syringe, Disposable, (B-D SYRINGE LUER-LOK 30CC) 30 ML MISC Use to change catheter 15 each 0  . Wound Dressings (GRX HYDROGEL GAUZE 4X4) PADS Use gauze to clean wound daily as needed 200 each 11   Facility-Administered Medications Prior to Visit  Medication Dose Route Frequency Provider Last Rate Last Dose  . botulinum toxin Type A (BOTOX) injection 200 Units  200 Units Intramuscular Once Festus Aloe, MD        No Known Allergies  ROS Review of Systems See hpi   Objective:    Physical Exam  BP (!) 121/58 (BP Location: Left Arm, Patient Position: Sitting, Cuff Size: Large)   Pulse 63   Temp 98 F (36.7 C) (Oral)   Resp 17   Ht '5\' 7"'$  (1.702 m)   Wt 300 lb (136.1 kg)   SpO2 97%   BMI 46.99 kg/m  Wt Readings from Last 3 Encounters:  06/27/19 300 lb (136.1 kg)  01/24/19 300 lb (136.1 kg)  12/19/18 290 lb 14.4 oz (132 kg)   Physical Exam  Constitutional: Oriented to person, place, and time. Appears well-developed and well-nourished.  HENT:  Head: Normocephalic and atraumatic.  Eyes: Conjunctivae and EOM are normal.  Cardiovascular: Normal rate, regular rhythm, normal heart sounds and intact distal pulses.  No murmur heard. Pulmonary/Chest: Effort normal and breath sounds normal. No stridor. No respiratory distress. Has no wheezes.  Neurological: Is alert and oriented to person, place, and time.  Skin: Skin is warm. Capillary refill takes less than 2 seconds.  Psychiatric: Has a normal mood and affect. Behavior is normal. Judgment and thought content normal.    Health Maintenance Due  Topic Date  Due  . OPHTHALMOLOGY EXAM  01/09/1970  . HIV Screening  01/09/1975  .  TETANUS/TDAP  01/09/1979  . URINE MICROALBUMIN  12/15/2017  . HEMOGLOBIN A1C  06/12/2019    There are no preventive care reminders to display for this patient.  Lab Results  Component Value Date   TSH 0.134 (L) 07/19/2018   Lab Results  Component Value Date   WBC 11.6 (H) 12/20/2018   HGB 9.2 (L) 12/20/2018   HCT 30.5 (L) 12/20/2018   MCV 85.7 12/20/2018   PLT 374 12/20/2018   Lab Results  Component Value Date   NA 137 12/20/2018   K 4.7 12/20/2018   CO2 24 12/20/2018   GLUCOSE 169 (H) 12/20/2018   BUN 16 12/20/2018   CREATININE 1.20 (H) 12/20/2018   BILITOT 0.6 12/12/2018   ALKPHOS 64 12/12/2018   AST 13 (L) 12/12/2018   ALT 12 12/12/2018   PROT 7.7 12/12/2018   ALBUMIN 4.2 12/12/2018   CALCIUM 8.2 (L) 12/20/2018   ANIONGAP 10 12/20/2018   Lab Results  Component Value Date   CHOL 130 07/19/2018   Lab Results  Component Value Date   HDL 64 07/19/2018   Lab Results  Component Value Date   LDLCALC 52 07/19/2018   Lab Results  Component Value Date   TRIG 69 07/19/2018   Lab Results  Component Value Date   CHOLHDL 2.0 07/19/2018   Lab Results  Component Value Date   HGBA1C 7.0 (H) 12/12/2018      Assessment & Plan:   Problem List Items Addressed This Visit      Digestive   Slow transit constipation (Chronic)     Endocrine   Type 2 diabetes mellitus with diabetic polyneuropathy, without long-term current use of insulin ([facility]) (Chronic)   Relevant Orders   CMP14+EGFR   Hemoglobin A1c   Lipid panel     Nervous and Auditory   Multiple sclerosis ([facility]CC) - Primary (Chronic)   Diabetes, polyneuropathy ([facility]CC) (Chronic)     Genitourinary   Endometrial cancer ([facility])     Other   Chronic indwelling Foley catheter (Chronic)   Chronically on opiate therapy (Chronic)   Physical deconditioning (Chronic)   Morbid obesity ([facility])    Other Visit Diagnoses    Does not transfer location       Skin breakdown          Patient needs a semi electric hospital  bed  The patient has a medical condition (multiple sclerosis, neuropathy, chronic pain and morbid obesity) which requires positioning of the body in ways not feasible in an ordinary bed in order to alleviate pain and chronic skin breakdown  Patient continues to have chronic urinary incontinence with indwelling foley catheter. She cannot shower without a hoyer lift as well as a mesh hoy seat attachment to move her into the shower chair to clean her skin and decrease wound break down Patient is unable to independently transfer from bed to chair or even to shift in the bed thus patient requires maximum assistance for all transfers due to body habitus (BMI 46.99), limited mobility, multiple sclerosis, chronic pain syndrome and neuropathy.  She should continue with the air pressure pump and pad to decrease skin and soft tissue breakdown as she is currently is unable to turn and reposition herself in bed   Diabetes:  Patient diabetes continues to be well controlled with blood glucoses in the 140 fastings Endometrial Cancer: She is finished with her cancer treatment for cervical cancer  s/p hysterectomy and is awaiting follow up Essential Hypertension: She continues to have very good blood pressures and is compliant with treatment Morbid obesity: Her weight is stable at 300 pounds Chronic Opiate Therapy and Chronic Constipation: she has chronic constipation with her chronic opiate use and MS, stable currently   No orders of the defined types were placed in this encounter.   Follow-up: No follow-ups on file.    Forrest Moron, MD

## 2019-06-27 NOTE — Patient Instructions (Signed)
° ° ° °  If you have lab work done today you will be contacted with your lab results within the next 2 weeks.  If you have not heard from us then please contact us. The fastest way to get your results is to register for My Chart. ° ° °IF you received an x-ray today, you will receive an invoice from [facility]. Please contact [facility] at [phone] with questions or concerns regarding your invoice.  ° °IF you received labwork today, you will receive an invoice from [facility]. Please contact [facility] at [phone] with questions or concerns regarding your invoice.  ° °Our billing staff will not be able to assist you with questions regarding bills from these companies. ° °You will be contacted with the lab results as soon as they are available. The fastest way to get your results is to activate your My Chart account. Instructions are located on the last page of this paperwork. If you have not heard from us regarding the results in 2 weeks, please contact this office. °  ° ° ° °

## 2019-06-28 LAB — CMP14+EGFR
ALT: 10 IU/L (ref 0–32)
AST: 12 IU/L (ref 0–40)
Albumin/Globulin Ratio: 1.6 (ref 1.2–2.2)
Albumin: 4.5 g/dL (ref 3.8–4.9)
Alkaline Phosphatase: 72 IU/L (ref 39–117)
BUN/Creatinine Ratio: 20 (ref 9–23)
BUN: 18 mg/dL (ref 6–24)
Bilirubin Total: 0.2 mg/dL (ref 0.0–1.2)
CO2: 26 mmol/L (ref 20–29)
Calcium: 9.5 mg/dL (ref 8.7–10.2)
Chloride: 94 mmol/L — ABNORMAL LOW (ref 96–106)
Creatinine, Ser: 0.88 mg/dL (ref 0.57–1.00)
GFR calc Af Amer: 83 mL/min/{1.73_m2} (ref >59)
GFR calc non Af Amer: 72 mL/min/{1.73_m2} (ref >59)
Globulin, Total: 2.8 g/dL (ref 1.5–4.5)
Glucose: 144 mg/dL — ABNORMAL HIGH (ref 65–99)
Potassium: 3.4 mmol/L — ABNORMAL LOW (ref 3.5–5.2)
Sodium: 138 mmol/L (ref 134–144)
Total Protein: 7.3 g/dL (ref 6.0–8.5)

## 2019-06-28 LAB — LIPID PANEL
Chol/HDL Ratio: 2.4 ratio (ref 0.0–4.4)
Cholesterol, Total: 151 mg/dL (ref 100–199)
HDL: 62 mg/dL (ref >39)
LDL Calculated: 72 mg/dL (ref 0–99)
Triglycerides: 83 mg/dL (ref 0–149)
VLDL Cholesterol Cal: 17 mg/dL (ref 5–40)

## 2019-06-28 LAB — HEMOGLOBIN A1C
Est. average glucose Bld gHb Est-mCnc: 143 mg/dL
Hgb A1c MFr Bld: 6.6 % — ABNORMAL HIGH (ref 4.8–5.6)

## 2019-07-06 ENCOUNTER — Telehealth: Payer: Self-pay | Admitting: Family Medicine

## 2019-07-06 NOTE — Telephone Encounter (Signed)
Pt called in regards to hospital bed and would like a call back as soon as possible.

## 2019-07-06 NOTE — Telephone Encounter (Signed)
Daughter answered phone advised to have pt call office for 5 pm regarding hospital bed. Agreeable will give message. Dgaddy, CMA

## 2019-07-10 ENCOUNTER — Telehealth: Payer: Self-pay | Admitting: Family Medicine

## 2019-07-10 NOTE — Telephone Encounter (Signed)
Spoke with Daphne at [facility] and she advises 2 order received but additional info needed and sent request twice for additional info with no response and order voided.  She advises we will need to resend order and additional supporting documentation for why pt needs bed. I found the cover sheet sent with missing info needed in detail.  I have rewritten order and it needs to have semi electric  Hospital bed on order and length patient will need the hospital bed.  The orders must have prescriber's full printed name, signature and NPI# on it and clinical supporting documentation that speak to the need for the hosptial bed being ordered. I contacted pt an advised the referral was voided due to our lack of sending new order and supporting documentation.  I advised I have supporting documentation printed along with new order for semi electric bed ready for Dr. Nolon Rod signature in the morning when she returns.  Pt asked will this be top priority I advised "yes " as we dropped the ball on this and I will make sure it is faxed and received by [employer] and will contact her when referral is in processing.  Pt is agreeable.  I again apologized for the inconvenience this has caused and will do all I can to make sure this is handled once and for all. Dgaddy, CMA

## 2019-07-10 NOTE — Telephone Encounter (Signed)
Pt is wanting tro know status of hospital bead request please  Contact pt asap.. [phone]

## 2019-07-11 DIAGNOSIS — E782 Mixed hyperlipidemia: Secondary | ICD-10-CM | POA: Diagnosis not present

## 2019-07-11 DIAGNOSIS — E1142 Type 2 diabetes mellitus with diabetic polyneuropathy: Secondary | ICD-10-CM | POA: Diagnosis not present

## 2019-07-11 DIAGNOSIS — I10 Essential (primary) hypertension: Secondary | ICD-10-CM | POA: Diagnosis not present

## 2019-07-11 NOTE — Telephone Encounter (Signed)
Documents refaxed 

## 2019-07-12 ENCOUNTER — Telehealth: Payer: Self-pay

## 2019-07-12 NOTE — Telephone Encounter (Signed)
Spoke with Ivin Booty at [facility] regarding status of hospital bed she advises it is processing but order doesn't have length of use of it.  She it advises to add length of use and intial and date beside it and resend.  I added 78yrs as length of use and initialed and faxed back updated order to [phone].  Confirmation faxed received at 12:12 pm.  Notified pt hospital bed order is in processing status.  Pt agreeable. Dgaddy, CMA

## 2019-07-13 DIAGNOSIS — N318 Other neuromuscular dysfunction of bladder: Secondary | ICD-10-CM | POA: Diagnosis not present

## 2019-07-18 ENCOUNTER — Other Ambulatory Visit: Payer: Self-pay | Admitting: Family Medicine

## 2019-07-18 NOTE — Telephone Encounter (Signed)
Forwarding medication refill request to the clinical pool for review. 

## 2019-07-20 ENCOUNTER — Telehealth: Payer: Self-pay

## 2019-07-20 ENCOUNTER — Encounter (HOSPITAL_BASED_OUTPATIENT_CLINIC_OR_DEPARTMENT_OTHER): Payer: Medicare Other | Attending: Internal Medicine

## 2019-07-20 DIAGNOSIS — L89313 Pressure ulcer of right buttock, stage 3: Secondary | ICD-10-CM | POA: Insufficient documentation

## 2019-07-20 DIAGNOSIS — G35 Multiple sclerosis: Secondary | ICD-10-CM | POA: Insufficient documentation

## 2019-07-20 DIAGNOSIS — L89319 Pressure ulcer of right buttock, unspecified stage: Secondary | ICD-10-CM | POA: Diagnosis not present

## 2019-07-20 NOTE — Telephone Encounter (Signed)
Tried contacting pt re: [employer] forms being completed but no answer or vm at home # but left message on [phone] to return office call. Dgaddy, CMA

## 2019-07-23 ENCOUNTER — Other Ambulatory Visit: Payer: Self-pay | Admitting: Family Medicine

## 2019-07-23 NOTE — Telephone Encounter (Signed)
Copied from [place] [id number]. Topic: Quick Communication - Rx Refill/Question >> Jul 23, 2019  1:33 PM Mcneil, Ja-Kwan wrote: Medication: potassium chloride (K-DUR) 10 MEQ tablet  Has the patient contacted their pharmacy? no  Preferred Pharmacy (with phone number or street name): [facility] - [address] [place] [phone] (Phone)   [phone] (Fax)  Agent: Please be advised that RX refills may take up to 3 business days. We ask that you follow-up with your pharmacy.

## 2019-07-23 NOTE — Telephone Encounter (Signed)
Please advise on refill and sig

## 2019-07-23 NOTE — Telephone Encounter (Signed)
Refilled medication 90 days,0 refills

## 2019-07-25 ENCOUNTER — Encounter: Payer: Self-pay | Admitting: Oncology

## 2019-07-25 ENCOUNTER — Telehealth: Payer: Self-pay

## 2019-07-25 ENCOUNTER — Telehealth: Payer: Self-pay | Admitting: Oncology

## 2019-07-25 MED ORDER — POTASSIUM CHLORIDE CRYS ER 10 MEQ PO TBCR: EXTENDED_RELEASE_TABLET | ORAL | 0 refills | Status: DC

## 2019-07-25 NOTE — Telephone Encounter (Signed)
[facility] and scheduled a follow up appointment with Dr. Denman George on 08/03/19 at 2 pm.  She verbalized agreement and requested a written appointment letter sent to her.

## 2019-07-25 NOTE — Telephone Encounter (Signed)
Tried contacting pt with no answer and left message on vm at the [phone] number per [employer] G[employer] paperwork complete and ready for pickup or do we need to fax and if so where to and number.  Tried another number listed on [employer] [phone] and daughter answered and will have pt call us back to give instructions on if form will be picked up or if we need to fax.  (Delores) has the form with her. Dgaddy, CMA

## 2019-07-26 ENCOUNTER — Ambulatory Visit: Payer: Medicare Other | Admitting: Family Medicine

## 2019-07-31 NOTE — Telephone Encounter (Signed)
Pt paperwork for hospital sent via fax to [place]. Dgaddy, CMA

## 2019-08-01 ENCOUNTER — Telehealth: Payer: Self-pay | Admitting: *Deleted

## 2019-08-01 NOTE — Telephone Encounter (Signed)
Returned the patient's call and rescheduled her appt for 10/2

## 2019-08-03 ENCOUNTER — Ambulatory Visit: Payer: Medicare Other | Admitting: Gynecologic Oncology

## 2019-08-03 ENCOUNTER — Telehealth: Payer: Self-pay | Admitting: Family Medicine

## 2019-08-03 DIAGNOSIS — G35 Multiple sclerosis: Secondary | ICD-10-CM

## 2019-08-03 DIAGNOSIS — N318 Other neuromuscular dysfunction of bladder: Secondary | ICD-10-CM | POA: Diagnosis not present

## 2019-08-03 DIAGNOSIS — Z79891 Long term (current) use of opiate analgesic: Secondary | ICD-10-CM

## 2019-08-03 NOTE — Telephone Encounter (Signed)
MED REFILL oxyCODONE-acetaminophen (PERCOCET) 10-325 MG tablet  REFILL [place], [place] [phone] (Phone) [phone] (Fax

## 2019-08-03 NOTE — Telephone Encounter (Signed)
Pt need pain pill filled

## 2019-08-06 ENCOUNTER — Ambulatory Visit (INDEPENDENT_AMBULATORY_CARE_PROVIDER_SITE_OTHER): Payer: Medicare Other | Admitting: Podiatry

## 2019-08-06 ENCOUNTER — Other Ambulatory Visit: Payer: Self-pay

## 2019-08-06 ENCOUNTER — Encounter: Payer: Self-pay | Admitting: Podiatry

## 2019-08-06 DIAGNOSIS — E1142 Type 2 diabetes mellitus with diabetic polyneuropathy: Secondary | ICD-10-CM | POA: Diagnosis not present

## 2019-08-06 DIAGNOSIS — B351 Tinea unguium: Secondary | ICD-10-CM

## 2019-08-06 DIAGNOSIS — L84 Corns and callosities: Secondary | ICD-10-CM

## 2019-08-06 DIAGNOSIS — G35 Multiple sclerosis: Secondary | ICD-10-CM | POA: Diagnosis not present

## 2019-08-06 NOTE — Patient Instructions (Signed)
Diabetic Neuropathy Diabetic neuropathy refers to nerve damage that is caused by diabetes (diabetes mellitus). Over time, people with diabetes can develop nerve damage throughout the body. There are several types of diabetic neuropathy:  Peripheral neuropathy. This is the most common type of diabetic neuropathy. It causes damage to nerves that carry signals between the spinal cord and other parts of the body (peripheral nerves). This usually affects nerves in the feet and legs first, and may eventually affect the hands and arms. The damage affects the ability to sense touch or temperature.  Autonomic neuropathy. This type causes damage to nerves that control involuntary functions (autonomic nerves). These nerves carry signals that control: ? Heartbeat. ? Body temperature. ? Blood pressure. ? Urination. ? Digestion. ? Sweating. ? Sexual function. ? Response to changing blood sugar (glucose) levels.  Focal neuropathy. This type of nerve damage affects one area of the body, such as an arm, a leg, or the face. The injury may involve one nerve or a small group of nerves. Focal neuropathy can be painful and unpredictable, and occurs most often in older adults with diabetes. This often develops suddenly, but usually improves over time and does not cause long-term problems.  Proximal neuropathy. This type of nerve damage affects the nerves of the thighs, hips, buttocks, or legs. It causes severe pain, weakness, and muscle death (atrophy), usually in the thigh muscles. It is more common among older men and people who have type 2 diabetes. The length of recovery time may vary. What are the causes? Peripheral, autonomic, and focal neuropathies are caused by diabetes that is not well controlled with treatment. The cause of proximal neuropathy is not known, but it may be caused by inflammation related to uncontrolled blood glucose levels. What are the signs or symptoms? Peripheral neuropathy Peripheral  neuropathy develops slowly over time. When the nerves of the feet and legs no longer work, you may experience:  Burning, stabbing, or aching pain in the legs or feet.  Pain or cramping in the legs or feet.  Loss of feeling (numbness) and inability to feel pressure or pain in the feet. This can lead to: ? Thick calluses or sores on areas of constant pressure. ? Ulcers. ? Reduced ability to feel temperature changes.  Foot deformities.  Muscle weakness.  Loss of balance or coordination. Autonomic neuropathy The symptoms of autonomic neuropathy vary depending on which nerves are affected. Symptoms may include:  Problems with digestion, such as: ? Nausea or vomiting. ? Poor appetite. ? Bloating. ? Diarrhea or constipation. ? Trouble swallowing. ? Losing weight without trying to.  Problems with the heart, blood and lungs, such as: ? Dizziness, especially when standing up. ? Fainting. ? Shortness of breath. ? Irregular heartbeat.  Bladder problems, such as: ? Trouble starting or stopping urination. ? Leaking urine. ? Trouble emptying the bladder. ? Urinary tract infections (UTIs).  Problems with other body functions, such as: ? Sweat. You may sweat too much or too little. ? Temperature. You might get hot easily. Or, you might feel cold more than usual. ? Sexual function. Men may not be able to get or maintain an erection. Women may have vaginal dryness and difficulty with arousal. Focal neuropathy Symptoms affect only one area of the body. Common symptoms include:  Numbness.  Tingling.  Burning pain.  Prickling feeling.  Very sensitive skin.  Weakness.  Inability to move (paralysis).  Muscle twitching.  Muscles getting smaller (wasting).  Poor coordination.  Double or blurred vision. Proximal   neuropathy  Sudden, severe pain in the hip, thigh, or buttocks. Pain may spread from the back into the legs (sciatica).  Pain and numbness in the arms and legs.   Tingling.  Loss of bladder control or bowel control.  Weakness and wasting of thigh muscles.  Difficulty getting up from a seated position.  Abdominal swelling.  Unexplained weight loss. How is this diagnosed? Diagnosis usually involves reviewing your medical history and any symptoms you have. Diagnosis varies depending on the type of neuropathy your health care provider suspects. Peripheral neuropathy Your health care provider will check areas that are affected by your nervous system (neurologic exam), such as your reflexes, how you move, and what you can feel. You may have other tests, such as:  Blood tests.  Removal and examination of fluid that surrounds the spinal cord (lumbar puncture).  CT scan.  MRI.  A test to check the nerves that control muscles (electromyogram, EMG).  Tests of how quickly messages pass through your nerves (nerve conduction velocity tests).  Removal of a small piece of nerve to be examined under a microscope (biopsy). Autonomic neuropathy You may have tests, such as:  Tests to measure your blood pressure and heart rate. This may include monitoring you while you are safely secured to an exam table that moves you from a lying position to an upright position (table tilt test).  Breathing tests to check your lungs.  Tests to check how food moves through the digestive system (gastric emptying tests).  Blood, sweat, or urine tests.  Ultrasound of your bladder.  Spinal fluid tests. Focal neuropathy This condition may be diagnosed with:  A neurologic exam.  CT scan.  MRI.  EMG.  Nerve conduction velocity tests. Proximal neuropathy There is no test to diagnose this type of neuropathy. You may have tests to rule out other possible causes of this type of neuropathy. Tests may include:  X-rays of your spine and lumbar region.  Lumbar puncture.  MRI. How is this treated? The goal of treatment is to keep nerve damage from getting worse.  The most important part of treatment is keeping your blood glucose level and your A1C level within your target range by following your diabetes management plan. Over time, maintaining lower blood glucose levels helps lessen symptoms. In some cases, you may need prescription pain medicine. Follow these instructions at home:  Lifestyle   Do not use any products that contain nicotine or tobacco, such as cigarettes and e-cigarettes. If you need help quitting, ask your health care provider.  Be physically active every day. Include strength training and balance exercises.  Follow a healthy meal plan.  Work with your health care provider to manage your blood pressure. General instructions  Follow your diabetes management plan as directed. ? Check your blood glucose levels as directed by your health care provider. ? Keep your blood glucose in your target range as directed by your health care provider. ? Have your A1C level checked at least two times a year, or as often as told by your health care provider.  Take over the counter and prescription medicines only as told by your health care provider. This includes insulin and diabetes medicine.  Do not drive or use heavy machinery while taking prescription pain medicines.  Check your skin and feet every day for cuts, bruises, redness, blisters, or sores.  Keep all follow up visits as told by your health care provider. This is important. Contact a health care provider if:    You have burning, stabbing, or aching pain in your legs or feet.  You are unable to feel pressure or pain in your feet.  You develop problems with digestion, such as: ? Nausea. ? Vomiting. ? Bloating. ? Constipation. ? Diarrhea. ? Abdominal pain.  You have difficulty with urination, such as inability: ? To control when you urinate (incontinence). ? To completely empty the bladder (retention).  You have palpitations.  You feel dizzy, weak, or faint when you stand  up. Get help right away if:  You cannot urinate.  You have sudden weakness or loss of coordination.  You have trouble speaking.  You have pain or pressure in your chest.  You have an irregular heart beat.  You have sudden inability to move a part of your body. Summary  Diabetic neuropathy refers to nerve damage that is caused by diabetes. It can affect nerves throughout the entire body, causing numbness and pain in the arms, legs, digestive tract, heart, and other body systems.  Keep your blood glucose level and your blood pressure in your target range, as directed by your health care provider. This can help prevent neuropathy from getting worse.  Check your skin and feet every day for cuts, bruises, redness, blisters, or sores.  Do not use any products that contain nicotine or tobacco, such as cigarettes and e-cigarettes. If you need help quitting, ask your health care provider. This information is not intended to replace advice given to you by your health care provider. Make sure you discuss any questions you have with your health care provider. Document Released: 01/31/2002 Document Revised: 01/04/2018 Document Reviewed: 12/27/2016 Elsevier Patient Education  2020 Elsevier Inc.  Onychomycosis/Fungal Toenails  WHAT IS IT? An infection that lies within the keratin of your nail plate that is caused by a fungus.  WHY ME? Fungal infections affect all ages, sexes, races, and creeds.  There may be many factors that predispose you to a fungal infection such as age, coexisting medical conditions such as diabetes, or an autoimmune disease; stress, medications, fatigue, genetics, etc.  Bottom line: fungus thrives in a warm, moist environment and your shoes offer such a location.  IS IT CONTAGIOUS? Theoretically, yes.  You do not want to share shoes, nail clippers or files with someone who has fungal toenails.  Walking around barefoot in the same room or sleeping in the same bed is unlikely  to transfer the organism.  It is important to realize, however, that fungus can spread easily from one nail to the next on the same foot.  HOW DO WE TREAT THIS?  There are several ways to treat this condition.  Treatment may depend on many factors such as age, medications, pregnancy, liver and kidney conditions, etc.  It is best to ask your doctor which options are available to you.  1. No treatment.   Unlike many other medical concerns, you can live with this condition.  However for many people this can be a painful condition and may lead to ingrown toenails or a bacterial infection.  It is recommended that you keep the nails cut short to help reduce the amount of fungal nail. 2. Topical treatment.  These range from herbal remedies to prescription strength nail lacquers.  About 40-50% effective, topicals require twice daily application for approximately 9 to 12 months or until an entirely new nail has grown out.  The most effective topicals are medical grade medications available through physicians offices. 3. Oral antifungal medications.  With an 80-90% cure   rate, the most common oral medication requires 3 to 4 months of therapy and stays in your system for a year as the new nail grows out.  Oral antifungal medications do require blood work to make sure it is a safe drug for you.  A liver function panel will be performed prior to starting the medication and after the first month of treatment.  It is important to have the blood work performed to avoid any harmful side effects.  In general, this medication safe but blood work is required. 4. Laser Therapy.  This treatment is performed by applying a specialized laser to the affected nail plate.  This therapy is noninvasive, fast, and non-painful.  It is not covered by insurance and is therefore, out of pocket.  The results have been very good with a 80-95% cure rate.  [facility] is the only practice in the area to offer this therapy. 5. Permanent  Nail Avulsion.  Removing the entire nail so that a new nail will not grow back. 

## 2019-08-07 MED ORDER — OXYCODONE-ACETAMINOPHEN 10-325 MG PO TABS: 1.0000 | ORAL_TABLET | Freq: Three times a day (TID) | ORAL | 0 refills | Status: DC | PRN

## 2019-08-07 NOTE — Telephone Encounter (Signed)
Patient notified

## 2019-08-07 NOTE — Telephone Encounter (Signed)
Pain medication sent in. Please notify the patient.

## 2019-08-13 NOTE — Progress Notes (Signed)
Subjective: [name] is seen today with h/o Multiple Sclerosis for follow up painful, elongated, thickened toenails b/l great toes that she cannot cut. Pain interferes with daily activities. Aggravating factor includes wearing enclosed shoe gear and relieved with periodic debridement.  She states her daughter trims her lesser toenails with no problems.  She also has plantar callus on plantar aspect of left foot.  Objective:  Neurovascular status unchanged: CFT <3 seconds x 10 digits.  DP/PT pulses palpable b/l.  No digital hair x 10 digits.   Skin temperature gradient WNL b/l.  Sensation diminished with 10 gram monofilament b/l.  Vibratory sensation diminished b/l LE.  Dermatological Examination: Skin with normal turgor, texture and tone b/l.  Toenails b/l great toes discolored, thick, dystrophic with subungual debris and pain with palpation to nailbeds due to thickness of nails. Nails 2-5 b/l recently trimmed.   Hyperkeratotic lesion submet head 5 left foot.  No edema, no erythema, no drainage, no flocculence.  Musculoskeletal: Muscle strength flaccid b/l LE.  Utilizes motorized wheelchair for mobility.  No gross bony deformities b/l.  No pain, crepitus or joint limitation noted with ROM.   Assessment: Painful onychomycosis toenails b/l great toes  Calluses submet head 5 left foot Multiple Sclerosis NIDDM with neuropathy  Plan: 1. Toenails b/l great toes were debrided in length and girth without iatrogenic bleeding. Toenails 2-5 recently trimmed by her daughter. 2. Callus pared submetatarsal head 5 left foot utilizing sterile scalpel blade without incident. 3. Patient to continue soft, supportive shoe gear daily.  4. Patient to report any pedal injuries to medical professional immediately. 5. Follow up 3 months.  6. Patient/POA to call should there be a concern in the interim.

## 2019-08-14 ENCOUNTER — Other Ambulatory Visit: Payer: Self-pay | Admitting: Family Medicine

## 2019-08-21 ENCOUNTER — Telehealth: Payer: Self-pay | Admitting: Family Medicine

## 2019-08-21 DIAGNOSIS — G35 Multiple sclerosis: Secondary | ICD-10-CM

## 2019-08-21 DIAGNOSIS — Z79891 Long term (current) use of opiate analgesic: Secondary | ICD-10-CM

## 2019-08-21 NOTE — Telephone Encounter (Signed)
Requested medication (s) are due for refill today: yes  Requested medication (s) are on the active medication list: yes  Last refill: 08/07/2019 and 07/25/2019  Future visit scheduled:yes  Notes to clinic:  Refill cannot be delegated    Requested Prescriptions  Pending Prescriptions Disp Refills   oxyCODONE-acetaminophen (PERCOCET) 10-325 MG tablet 90 tablet 0    Sig: Take 1 tablet by mouth every 8 (eight) hours as needed for pain.     Not Delegated - Analgesics:  Opioid Agonist Combinations Failed - 08/21/2019 10:53 AM      Failed - This refill cannot be delegated      Failed - Urine Drug Screen completed in last 360 days.      Passed - Valid encounter within last 6 months    Recent Outpatient Visits          1 month ago Multiple sclerosis ([place])   Primary Care at [facility], [name], MD   6 months ago Chronically on opiate therapy   Primary Care at [facility], [name], MD   1 year ago Chronically on opiate therapy   Primary Care at [facility], [place] A, MD   1 year ago Type 2 diabetes mellitus with complication, without long-term current use of insulin ([place])   Primary Care at [facility], Zoe A, MD   1 year ago Screening for cervical cancer   Primary Care at [place], MD      Future Appointments            In 4 months Forrest Moron, MD Primary Care at [place], [facility]            potassium chloride (K-DUR) 10 MEQ tablet 30 tablet 0    Sig: TAKE 3 TABLETS BY MOUTH TWICE DAILY     Endocrinology:  Minerals - Potassium Supplementation Failed - 08/21/2019 10:53 AM      Failed - K in normal range and within 360 days    Potassium  Date Value Ref Range Status  06/27/2019 3.4 (L) 3.5 - 5.2 mmol/L Final         Passed - Cr in normal range and within 360 days    Creatinine, Ser  Date Value Ref Range Status  06/27/2019 0.88 0.57 - 1.00 mg/dL Final         Passed - Valid encounter within last 12 months    Recent Outpatient Visits          1  month ago Multiple sclerosis ([place])   Primary Care at [facility], [name], MD   6 months ago Chronically on opiate therapy   Primary Care at [facility], [name], MD   1 year ago Chronically on opiate therapy   Primary Care at [facility], [place] A, MD   1 year ago Type 2 diabetes mellitus with complication, without long-term current use of insulin [facility])   Primary Care at [facility], [name], MD   1 year ago Screening for cervical cancer   Primary Care at [facility], MD      Future Appointments            In 4 months Forrest Moron, MD Primary Care at [place], [facility]

## 2019-08-21 NOTE — Telephone Encounter (Signed)
Medication:  oxyCODONE (OXYCONTIN) 20 mg 12 hr tablet  potassium chloride (K-DUR) 10 MEQ tablet    Patient is requesting refills.     Pharmacy:  [place], [place] [phone] (Phone) [phone] (Fax)

## 2019-08-24 ENCOUNTER — Other Ambulatory Visit: Payer: Self-pay

## 2019-08-24 ENCOUNTER — Encounter (HOSPITAL_BASED_OUTPATIENT_CLINIC_OR_DEPARTMENT_OTHER): Payer: Medicare Other | Attending: Internal Medicine

## 2019-08-24 DIAGNOSIS — I10 Essential (primary) hypertension: Secondary | ICD-10-CM | POA: Insufficient documentation

## 2019-08-24 DIAGNOSIS — L89313 Pressure ulcer of right buttock, stage 3: Secondary | ICD-10-CM | POA: Diagnosis not present

## 2019-08-24 DIAGNOSIS — G35 Multiple sclerosis: Secondary | ICD-10-CM | POA: Insufficient documentation

## 2019-08-24 DIAGNOSIS — L89312 Pressure ulcer of right buttock, stage 2: Secondary | ICD-10-CM | POA: Diagnosis not present

## 2019-08-24 DIAGNOSIS — E1140 Type 2 diabetes mellitus with diabetic neuropathy, unspecified: Secondary | ICD-10-CM | POA: Insufficient documentation

## 2019-08-24 DIAGNOSIS — N318 Other neuromuscular dysfunction of bladder: Secondary | ICD-10-CM | POA: Diagnosis not present

## 2019-08-31 ENCOUNTER — Telehealth: Payer: Self-pay

## 2019-08-31 NOTE — Telephone Encounter (Signed)
Received orders for DME for Disposable incontinence Liners.   Order form signed and faxed back with confirmation. Faxed to [phone]

## 2019-09-06 ENCOUNTER — Other Ambulatory Visit: Payer: Self-pay

## 2019-09-06 DIAGNOSIS — G35 Multiple sclerosis: Secondary | ICD-10-CM

## 2019-09-06 DIAGNOSIS — Z79891 Long term (current) use of opiate analgesic: Secondary | ICD-10-CM

## 2019-09-06 NOTE — Telephone Encounter (Signed)
Requested Prescriptions   Pending Prescriptions Disp Refills  . oxyCODONE-acetaminophen (PERCOCET) 10-325 MG tablet 90 tablet 0    Sig: Take 1 tablet by mouth every 8 (eight) hours as needed for pain.    Last OV 06/27/2019  Last written 08/07/2019

## 2019-09-07 ENCOUNTER — Inpatient Hospital Stay: Payer: Medicare Other | Attending: Gynecologic Oncology | Admitting: Gynecologic Oncology

## 2019-09-07 ENCOUNTER — Encounter: Payer: Self-pay | Admitting: Gynecologic Oncology

## 2019-09-07 ENCOUNTER — Other Ambulatory Visit: Payer: Self-pay

## 2019-09-07 VITALS — BP 142/67 | HR 86 | Temp 98.3°F | Ht 67.0 in | Wt 300.0 lb

## 2019-09-07 DIAGNOSIS — M1990 Unspecified osteoarthritis, unspecified site: Secondary | ICD-10-CM | POA: Insufficient documentation

## 2019-09-07 DIAGNOSIS — Z90710 Acquired absence of both cervix and uterus: Secondary | ICD-10-CM

## 2019-09-07 DIAGNOSIS — Z993 Dependence on wheelchair: Secondary | ICD-10-CM | POA: Diagnosis not present

## 2019-09-07 DIAGNOSIS — E782 Mixed hyperlipidemia: Secondary | ICD-10-CM | POA: Insufficient documentation

## 2019-09-07 DIAGNOSIS — Z923 Personal history of irradiation: Secondary | ICD-10-CM

## 2019-09-07 DIAGNOSIS — G35 Multiple sclerosis: Secondary | ICD-10-CM | POA: Insufficient documentation

## 2019-09-07 DIAGNOSIS — C541 Malignant neoplasm of endometrium: Secondary | ICD-10-CM | POA: Insufficient documentation

## 2019-09-07 DIAGNOSIS — I10 Essential (primary) hypertension: Secondary | ICD-10-CM | POA: Insufficient documentation

## 2019-09-07 DIAGNOSIS — Z7984 Long term (current) use of oral hypoglycemic drugs: Secondary | ICD-10-CM | POA: Insufficient documentation

## 2019-09-07 DIAGNOSIS — Z90722 Acquired absence of ovaries, bilateral: Secondary | ICD-10-CM | POA: Insufficient documentation

## 2019-09-07 DIAGNOSIS — E1142 Type 2 diabetes mellitus with diabetic polyneuropathy: Secondary | ICD-10-CM | POA: Diagnosis not present

## 2019-09-07 MED ORDER — OXYCODONE-ACETAMINOPHEN 10-325 MG PO TABS: 1.0000 | ORAL_TABLET | Freq: Three times a day (TID) | ORAL | 0 refills | Status: DC | PRN

## 2019-09-07 NOTE — Patient Instructions (Signed)
Please notify Dr Denman George at phone number [phone] if you notice vaginal bleeding, new pelvic or abdominal pains, bloating, feeling full easy, or a change in bladder or bowel function.   Please ask Dr Clabe Seal office to contact Dr Serita Grit office (at [phone]) in January, after your appointment with him to request an appointment with her for April, 2021.  If your bladder leakage becomes worse and bothersome, consider reaching out to [facility] to discuss injections of the urethra.

## 2019-09-07 NOTE — Progress Notes (Signed)
Follow-up Note: Gyn-Onc  Consult was initially requested by Dr. Mora Bellman for the evaluation of [name] 58 y.o. female  CC:  Chief Complaint  Patient presents with  . endometrial cancer    follow-up    Assessment/Plan:  [place]. [name]  is a 58 y.o.  year old with [place] (wheelchair bound) with stage IB grade 2 endometrial cancer with positive ITC's in the SLN's and LVSI.  High/intermediate risk factors, s/p WPRT and vaginal brachytherapy completed May, 2020.   I recommend she follow-up at 3 monthly intervals for symptom review, physical examination and pelvic examination. Pap smear is not recommended in routine endometrial cancer surveillance. After 2 years we will space these visits to every 6 months, and then annually if recurrence has not developed within 5 years. All questions were answered.  HPI: [name] is a 58 year old P1 who is seen in consultation at the request of Dr Elly Modena for grade 2 endometrial cancer.  The patient reports a history of vaginal bleeding for 6 months (since the summer of 2019).  Formed a transvaginal ultrasound scan on November 15, 2018 which revealed a uterus measuring 11.5 x 7.2 x 7.3 cm with a posterior lower uterine segment myoma measuring approximately 6 cm.  The endometrium was thickened to 34 mm.  The left and right ovaries were not clearly visualized.  An endometrial biopsy was then taken in the office which revealed FIGO grade 2 endometrioid adenocarcinoma.  The patient has a medical history significant for multiple sclerosis.  She is wheelchair-bound.  She has a suprapubic catheter.  She has spasms and no function in her lower extremities.  She has multiple decubitus ulcers.  She lives with her daughter.  She is independent through the day in her motorized wheelchair.  She requires Hoyer lift for transfers.  Her associated medical history significant for hypertension, diabetes mellitus on oral medications, and arthritis  Her family  history is significant for a mother who had a history of breast cancer.  A suprapubic catheter was placed in 2009 and then later replaced.  She has had no other prior abdominal surgeries.  Her neurologist is Dr. Felecia Shelling. Endometrial biopsy was performed on 11/10/18 which showed FIGO grade 2 endometrial cancer. [place] on November 15, 2018 showed a uterus measuring 11.5 x 7.2 x 7.3 cm with a 5 cm posterior leiomyoma in the mid to lower uterine segment.  There was a thickened endometrium at 34 mm.  The ovaries were not visualized.  There is no free fluid.  On 12/19/18 she underwent robotic assisted total hysterectomy BSO sentinel lymph node biopsy.  The uterus is greater than 250 g.  The procedure was complicated by morbid obesity and multiple sclerosis, paralysis.  Final pathology revealed a FIGO grade 2 endometrial cancer with outer half myometrial invasion (30 mm of 40 mm depth invasion close (.  LVSI was identified.  There was carcinoma involving the lower uterine segment.  The tumor size was 9.5 cm.  Ovaries and fallopian tubes and cervix were unremarkable.  There were no macro micrometastases and sentinel lymph nodes however isolated tumor cells were identified in the left obturator sentinel lymph node.  She was staged as FIGO stage Ib grade 2 adenocarcinoma of the endometrium with high risk features and recommendation for adjuvant radiation was made in accordance with NCCN guidelines.  Interval Hx:  She received adjuvant radiation for her high risk early stage endometrial cancer. Radiation treatment dates:   1. IMRT: 02/14/19-03/20/19  2. HDR Ir-192: 03/27/19, 04/04/19, 04/11/19   Site/dose: 1. Pelvis; 25 fractions of 1.8 Gy for a total of 45 Gy                    2. Vagina, 6 Gy in 3 fractions for a total dose of 18 Gy  Current Meds:   Current Outpatient Medications:  .  Adhesive Tape (PAPER TAPE 1"X10YD) TAPE, Apply to dressings as needed, Disp: 4 each,  Rfl: 6 .  atorvastatin (LIPITOR) 10 MG tablet, Take 10 mg by mouth daily. , Disp: , Rfl:  .  baclofen (LIORESAL) 20 MG tablet, TAKE 1 TABLET BY MOUTH FOUR TIMES A DAY**OFFICE VISIT NEEDED**, Disp: 120 tablet, Rfl: 0 .  Catheters (DOVER UNIVERSAL FOLEY TRAY) KIT, Use foley tray kit to change catheter every 3 weeks or as needed, Disp: 15 each, Rfl: 1 .  Catheters (FOLEY CATHETER 2-WAY) MISC, Use to aspirate urine suprapubic, Disp: 15 each, Rfl: 1 .  Cholecalciferol (D3-1000) 1000 units capsule, Take 2,000 Units by mouth daily. , Disp: , Rfl:  .  Control Gel Formula Dressing (DUODERM CGF DRESSING) MISC, Apply 1 each topically daily as needed., Disp: 5 each, Rfl: 11 .  cyanocobalamin 500 MCG tablet, Take 500 mcg by mouth daily., Disp: , Rfl:  .  Disposable Gloves MISC, Use disposable gloves to change catheter as needed, Disp: 200 each, Rfl: 11 .  furosemide (LASIX) 20 MG tablet, Take 1 tablet (20 mg total) by mouth daily., Disp: 90 tablet, Rfl: 0 .  glipiZIDE (GLUCOTROL) 5 MG tablet, Take 1 tablet (5 mg total) by mouth daily before lunch., Disp: 90 tablet, Rfl: 3 .  hydrochlorothiazide (HYDRODIURIL) 25 MG tablet, TAKE 1 TABLET BY MOUTH EVERY MORNING, Disp: 90 tablet, Rfl: 0 .  Incontinence Supply Disposable (TENA FLEX 16 PLUS) MISC, Use daily as needed, Disp: 30 each, Rfl: 11 .  Incontinence Supply Disposable (TENA PROTECT UNDERWEAR PLS/XLG) MISC, Use for incontinence., Disp: 30 each, Rfl: 11 .  Interferon Beta-1b (BETASERON) 0.3 MG KIT injection, Inject 0.3 mg into the skin every other day., Disp: 36 each, Rfl: 3 .  lidocaine (XYLOCAINE) 2 % jelly, USE when need with dressings, Disp: , Rfl: 1 .  metFORMIN (GLUCOPHAGE) 1000 MG tablet, Take 1,000 mg by mouth 2 (two) times daily with a meal., Disp: , Rfl:  .  Methenamine-Sodium Salicylate (AZO URINARY TRACT DEFENSE) 162-162.5 MG TABS, Take as instructed, Disp: , Rfl:  .  MOVANTIK 25 MG TABS tablet, Take 25 mg by mouth every morning., Disp: , Rfl: 4 .   NON FORMULARY, Power wheelchair repairs  Diagnosis code z99.3, Disp: 1 each, Rfl: 0 .  NON FORMULARY, Dispense catheter urine drainage bags, Disp: 90 Bag, Rfl: 3 .  nystatin ointment (MYCOSTATIN), Apply to affected area twice daily as needed for irritation, Disp: 30 g, Rfl: 6 .  Ostomy Supplies (NEW IMAGE SKIN/FLANGE/TAPE) MISC, 1 application by Does not apply route daily as needed., Disp: 30 each, Rfl: 11 .  oxyCODONE (OXYCONTIN) 20 mg 12 hr tablet, Take 1 tablet (20 mg total) by mouth 3 (three) times daily as needed (pain)., Disp: 90 tablet, Rfl: 0 .  oxyCODONE-acetaminophen (PERCOCET) 10-325 MG tablet, Take 1 tablet by mouth every 8 (eight) hours as needed for pain. Needs office visit., Disp: 90 tablet, Rfl: 0 .  potassium chloride (K-DUR,KLOR-CON) 10 MEQ tablet, TAKE 3 TABLETS BY MOUTH TWICE DAILY**OFFICE VISIT NEEDED FOR REFILLS**, Disp: 180 tablet, Rfl: 0 .  Syringe, Disposable, (B-D SYRINGE  LUER-LOK 30CC) 30 ML MISC, Use to change catheter, Disp: 15 each, Rfl: 0 .  Wound Dressings (GRX HYDROGEL GAUZE 4X4) PADS, Use gauze to clean wound daily as needed, Disp: 200 each, Rfl: 11   Allergy: No Known Allergies  Social Hx:   Social History   Socioeconomic History  . Marital status: Single    Spouse name: Not on file  . Number of children: Not on file  . Years of education: Not on file  . Highest education level: Not on file  Occupational History  . Not on file  Social Needs  . Financial resource strain: Not on file  . Food insecurity    Worry: Not on file    Inability: Not on file  . Transportation needs    Medical: Not on file    Non-medical: Not on file  Tobacco Use  . Smoking status: Never Smoker  . Smokeless tobacco: Never Used  Substance and Sexual Activity  . Alcohol use: No  . Drug use: No  . Sexual activity: Not on file  Lifestyle  . Physical activity    Days per week: Not on file    Minutes per session: Not on file  . Stress: Not on file  Relationships  . Social  Herbalist on phone: Not on file    Gets together: Not on file    Attends religious service: Not on file    Active member of club or organization: Not on file    Attends meetings of clubs or organizations: Not on file    Relationship status: Not on file  . Intimate partner violence    Fear of current or ex partner: Not on file    Emotionally abused: Not on file    Physically abused: Not on file    Forced sexual activity: Not on file  Other Topics Concern  . Not on file  Social History Narrative  . Not on file    Past Surgical Hx:  Past Surgical History:  Procedure Laterality Date  . COLONOSCOPY    . ESOPHAGOGASTRODUODENOSCOPY N/A 12/02/2017   Procedure: ESOPHAGOGASTRODUODENOSCOPY (EGD);  Surgeon: Ronnette Juniper, MD;  Location: [facility];  Service: Gastroenterology;  Laterality: N/A;  . ROBOTIC ASSISTED TOTAL HYSTERECTOMY WITH BILATERAL SALPINGO OOPHERECTOMY Bilateral 12/19/2018   Procedure: XI ROBOTIC ASSISTED TOTAL HYSTERECTOMY (UTERUS GREATER THAN 250gr) WITH BILATERAL SALPINGO OOPHORECTOMY;  Surgeon: Everitt Amber, MD;  Location: [facility];  Service: Gynecology;  Laterality: Bilateral;  . SENTINEL NODE BIOPSY N/A 12/19/2018   Procedure: SENTINEL NODE BIOPSY;  Surgeon: Everitt Amber, MD;  Location: [facility];  Service: Gynecology;  Laterality: N/A;  . SUPRAPUBIC CATHETER INSERTION  2009  . TONSILLECTOMY    . TUBAL LIGATION      Past Medical Hx:  Past Medical History:  Diagnosis Date  . Anemia    history of  . Arthritis   . Decubitus ulcers    currently treated at wound center  . Diabetes mellitus without complication ([place])   . Diabetic polyneuropathy ([place])   . Endometrial cancer ([facility])    Grade 2  . Fibroids   . Heartburn    Occ  . Hypertension   . Mixed hyperlipidemia   . Morbid obesity ([place])   . [place] (multiple sclerosis) ([place])     Past Gynecological History:  See HPI No LMP recorded. Patient is postmenopausal.  Family Hx:  Family History  Problem Relation Age  of Onset  . Cancer Mother   . Hypertension  Mother   . Heart disease Father   . Diabetes Father   . Hypertension Sister   . Hypertension Brother   . Breast cancer Neg Hx     Review of Systems:  Constitutional  Feels well,   ENT Normal appearing ears and nares bilaterally Skin/Breast  No rash, sores, jaundice, itching, dryness Cardiovascular  No chest pain, shortness of breath, or edema  Pulmonary  No cough or wheeze.  Gastro Intestinal  No nausea, vomitting, or diarrhoea. No bright red blood per rectum, no abdominal pain,+constipation.  Genito Urinary  No frequency, urgency, dysuria, no bleeding Musculo Skeletal  No myalgia, arthralgia, joint swelling or pain  Neurologic  + spastic paralysis lower extremities Psychology  No depression, anxiety, insomnia.   Vitals:  Blood pressure (!) 142/67, pulse 86, temperature 98.3 F (36.8 C), temperature source Temporal, height '5\' 7"'$  (1.702 m), weight 300 lb (136.1 kg), SpO2 100 %.  Physical Exam: WD in NAD Neck  Supple NROM, without any enlargements.  Lymph Node Survey No cervical supraclavicular or inguinal adenopathy Cardiovascular  Pulse normal rate, regularity and rhythm. S1 and S2 normal.  Lungs  Clear to auscultation bilateraly, without wheezes/crackles/rhonchi. Good air movement.  Skin  No rash/lesions/breakdown  Psychiatry  Alert and oriented to person, place, and time  Abdomen  Normoactive bowel sounds, abdomen soft, non-tender and obese without evidence of hernia. Suprapubic catheter sites present. Well healed incisions. Back No CVA tenderness Genito Urinary  Vaginal smooth, no bleeding, no masses palpable. Rectal  deferred Extremities  No bilateral cyanosis, clubbing or edema. Right posterior thigh was healing well.    Thereasa Solo, MD  09/07/2019, 3:53 PM

## 2019-09-10 ENCOUNTER — Telehealth: Payer: Self-pay | Admitting: *Deleted

## 2019-09-10 NOTE — Telephone Encounter (Signed)
Called patient to inform of fu with Dr. Sondra Come on 12/20/19 @ 4 pm, spoke with patient and she is aware of this appt.

## 2019-09-11 ENCOUNTER — Other Ambulatory Visit: Payer: Self-pay | Admitting: Family Medicine

## 2019-09-11 NOTE — Progress Notes (Signed)
Alexandria, Brooks ([id number]) Visit Report for 07/20/2019 Arrival Information Details Patient Name: Date of Service: Alexandria, Brooks 07/20/2019 1:45 PM Medical Record [id number] Patient Account Number: [id number] Date of Birth/Sex: 1961-08-02 (58 y.o. Female) Treating RN: [name] Primary Care [name]: [name] Other Clinician: Sandre Brooks Referring [name]: Treating [name]/Extender:[name] Weeks in Treatment: 127 Visit Information History Since Last Visit All ordered tests and consults were completed: No Patient Arrived: Wheel Chair Added or deleted any medications: No Arrival Time: 13:52 Any new allergies or adverse reactions: No Accompanied By: self Had a fall or experienced change in No activities of daily living that may affect Transfer Assistance: Harrel Lemon Lift risk of falls: Patient Identification Verified: Yes Signs or symptoms of abuse/neglect since last No Secondary Verification Process Yes visito Completed: Hospitalized since last visit: No Patient Requires Transmission-Based No Implantable device outside of the clinic excluding No Precautions: cellular tissue based products placed in the center Patient Has Alerts: No since last visit: Has Dressing in Place as Prescribed: Yes Pain Present Now: No Electronic Signature(s) Signed: 09/11/2019 6:03:31 PM By: Alexandria Coria RN Entered By: [name] on 07/20/2019 13:53:34 -------------------------------------------------------------------------------- Clinic Level of Care Assessment Details Patient Name: Date of Service: [name] 07/20/2019 1:45 PM Medical Record [id number] Patient Account Number: [id number] Date of Birth/Sex: [birth year] (58 y.o. Female) Treating RN: Baruch Gouty Primary Care [name]: [name] Other Clinician: Sandre Brooks Referring Jennesis Ramaswamy: Treating Selah Klang/Extender:[name] Weeks in Treatment: 127 Clinic Level of  Care Assessment Items TOOL 4 Quantity Score _0  - Use when only an EandM is performed on FOLLOW-UP visit 0 ASSESSMENTS - Nursing Assessment / Reassessment X - Reassessment of Co-morbidities (includes updates in patient status) 1 10 X - Reassessment of Adherence to Treatment Plan 1 5 ASSESSMENTS - Wound and Skin Assessment / Reassessment X - Simple Wound Assessment / Reassessment - one wound 1 5 _1  - Complex Wound Assessment / Reassessment - multiple wounds 0 _2  - Dermatologic / Skin Assessment (not related to wound area) 0 ASSESSMENTS - Focused Assessment _3  - Circumferential Edema Measurements - multi extremities 0 _4  - Nutritional Assessment / Counseling / Intervention 0 _5  - Lower Extremity Assessment (monofilament, tuning fork, pulses) 0 _6  - Peripheral Arterial Disease Assessment (using hand held doppler) 0 ASSESSMENTS - Ostomy and/or Continence Assessment and Care _7  - Incontinence Assessment and Management 0 _8  - Ostomy Care Assessment and Management (repouching, etc.) 0 PROCESS - Coordination of Care X - Simple Patient / Family Education for ongoing care 1 15 _9  - Complex (extensive) Patient / Family Education for ongoing care 0 X - Staff obtains Programmer, systems, Records, Test Results / Process Orders 1 10 _10  - Staff telephones HHA, Nursing Homes / Clarify orders / etc 0 _11  - Routine Transfer to another Facility (non-emergent condition) 0 _12  - Routine Hospital Admission (non-emergent condition) 0 _13  - New Admissions / Biomedical engineer / Ordering NPWT, Apligraf, etc. 0 _14  - Emergency Hospital Admission (emergent condition) 0 X - Simple Discharge Coordination 1 10 _15  - Complex (extensive) Discharge Coordination 0 PROCESS - Special Needs _16  - Pediatric / Minor Patient Management 0 _17  - Isolation Patient Management 0 _18  - Hearing / Language / Visual special needs 0 _19  - Assessment of Community assistance (transportation, D/C planning, etc.) 0 _20  - Additional assistance / Altered  mentation 0 _21  - Support Surface(s) Assessment (bed, cushion, seat, etc.) 0 INTERVENTIONS - Wound Cleansing / Measurement X - Simple Wound Cleansing - one wound 1 5 _22  - Complex Wound  Cleansing - multiple wounds 0 X - Wound Imaging (photographs - any number of wounds) 1 5 _0  - Wound Tracing (instead of photographs) 0 X - Simple Wound Measurement - one wound 1 5 _1  - Complex Wound Measurement - multiple wounds 0 INTERVENTIONS - Wound Dressings X - Small Wound Dressing one or multiple wounds 1 10 _2  - Medium Wound Dressing one or multiple wounds 0 _3  - Large Wound Dressing one or multiple wounds 0 X - Application of Medications - topical 1 5 <[id number]>_3<\/[id number]>_3  - Application of Medications - injection 0 INTERVENTIONS - Miscellaneous _5  - External ear exam 0 _6  - Specimen Collection (cultures, biopsies, blood, body fluids, etc.) 0 _7  - Specimen(s) / Culture(s) sent or taken to Lab for analysis 0 _8  - Patient Transfer (multiple staff / Civil Service fast streamer / Similar devices) 0 _9  - Simple Staple / Suture removal (25 or less) 0 _10  - Complex Staple / Suture removal (26 or more) 0 _11  - Hypo / Hyperglycemic Management (close monitor of Blood Glucose) 0 _12  - Ankle / Brachial Index (ABI) - do not check if billed separately 0 X - Vital Signs 1 5 Has the patient been seen at the hospital within the last three years: Yes Total Score: 90 Level Of Care: New/Established - Level 3 Electronic Signature(s) Signed: 07/20/2019 5:20:10 PM By: Baruch Gouty RN, BSN Entered By: Baruch Gouty on 07/20/2019 14:19:25 -------------------------------------------------------------------------------- Encounter Discharge Information Details Patient Name: Date of Service: Alexandria, Brooks 07/20/2019 1:45 PM Medical Record [id number] Patient Account Number: [id number] Date of Birth/Sex: Treating RN: 11-Jan-1961 (58 y.o. Female) Deon Pilling Primary Care Yevette Knust: [name] Other Clinician: Sandre Brooks Referring Fariha Goto:  Treating Wade Asebedo/Extender:[name] Weeks in Treatment: 127 Encounter Discharge Information Items Discharge Condition: Stable Ambulatory Status: Wheelchair Discharge Destination: Home Transportation: Private Auto Accompanied By: self Schedule Follow-up Appointment: Yes Clinical Summary of Care: Electronic Signature(s) Signed: 07/20/2019 5:35:34 PM By: Deon Pilling Entered By: Deon Pilling on 07/20/2019 14:54:23 -------------------------------------------------------------------------------- [name] Details Patient Name: Date of Service: Kawthar, Ennen 07/20/2019 1:45 PM Medical Record [id number] Patient Account Number: [id number] Date of Birth/Sex: [birth year] (58 y.o. Female) Treating RN: Baruch Gouty Primary Care Teira Arcilla: [name] Other Clinician: Sandre Brooks Referring Eliyohu Class: Treating Jaren Vanetten/Extender:[name] Weeks in Treatment: 127 Active Inactive Pressure Nursing Diagnoses: Knowledge deficit related to causes and risk factors for pressure ulcer development Goals: Patient will remain free from development of additional pressure ulcers Date Initiated: 10/27/2018 Target Resolution Date: 08/17/2019 Goal Status: Active Patient/caregiver will verbalize risk factors for pressure ulcer development Date Inactivated: 10/27/2018 Target12/25/2019 Resolution Date Initiated: 02/11/2017 Date: Goal Status: Met Interventions: Assess: immobility, friction, shearing, incontinence upon admission and as needed Notes: Wound/Skin Impairment Nursing Diagnoses: Impaired tissue integrity Knowledge deficit related to ulceration/compromised skin integrity Goals: Patient/caregiver will verbalize understanding of skin care regimen Date Initiated: 11/11/2017 Target Resolution Date: 08/17/2019 Goal Status: Active Interventions: Assess patient/caregiver ability to obtain necessary supplies Assess  patient/caregiver ability to perform ulcer/skin care regimen upon admission and as needed Assess ulceration(s) every visit Treatment Activities: Patient referred to home care : 11/11/2017 Skin care regimen initiated : 11/11/2017 Topical wound management initiated : 11/11/2017 Notes: Electronic Signature(s) Signed: 07/20/2019 5:20:10 PM By: Baruch Gouty RN, BSN Entered By: Baruch Gouty on 07/20/2019 14:18:17 -------------------------------------------------------------------------------- Pain Assessment Details Patient Name: Date of Service: Love, Chowning 07/20/2019 1:45 PM Medical Record [id number] Patient Account Number: [id number] Date of Birth/Sex: 06/24/1961 (58 y.o. Female) Treating RN: [name] Primary Care Casilda Pickerill: [name] Other Clinician: Sandre Brooks  Referring Elzena Muston: Treating Raine Elsass/Extender:[name] Weeks in Treatment: 127 Active Problems Location of Pain Severity and Description of Pain Patient Has Paino No Site Locations Pain Management and Medication Current Pain Management: Electronic Signature(s) Signed: 09/11/2019 6:03:31 PM By: Alexandria Coria RN Entered By: [name] on 07/20/2019 13:56:07 -------------------------------------------------------------------------------- Patient/Caregiver Education Details Patient Name: [name] 8/14/2020andnbsp1:45 Date of Service: PM Medical Record [id number] Number: Patient Account Number: [id number] Treating RN: 03/17/1961 (58 y.o. Baruch Gouty Date of Birth/Gender: Female) Other Clinician: Sandre Brooks Primary Care Treating STALLINGS, Hedy Jacob Physician: Physician/Extender: Referring Physician: Faylene Kurtz in Treatment: 127 Education Assessment Education Provided To: Patient Education Topics Provided Pressure: Methods: Explain/Verbal Responses: Reinforcements needed, State content correctly Wound/Skin Impairment: Methods:  Explain/Verbal Responses: Reinforcements needed, State content correctly Electronic Signature(s) Signed: 07/20/2019 5:20:10 PM By: Baruch Gouty RN, BSN Entered By: Baruch Gouty on 07/20/2019 14:18:58 -------------------------------------------------------------------------------- Wound Assessment Details Patient Name: Date of Service: Alexandria, Brooks 07/20/2019 1:45 PM Medical Record [id number] Patient Account Number: [id number] Date of Birth/Sex: 1961/02/10 (58 y.o. Female) Treating RN: [name] Primary Care Rian Koon: [name] Other Clinician: Sandre Brooks Referring Shakira Los: Treating Lahna Nath/Extender:[name] Weeks in Treatment: 127 Wound Status Wound Number: 1 Primary Pressure Ulcer Etiology: Wound Location: Right Ischium Wound Open Wounding Event: Pressure Injury Status: Date Acquired: 12/06/2005 Comorbid Hypertension, Type II Diabetes, Weeks Of Treatment: 127 History: Osteoarthritis, Neuropathy Clustered Wound: No Photos Wound Measurements Length: (cm) 1.1 % Reduction in A Width: (cm) 4.3 % Reduction in V Depth: (cm) 0.1 Epithelializatio Clustered Quantity: 2 Tunneling: Area: (cm) 3.715 Undermining: Volume: (cm) 0.371 Wound Description Classification: Category/Stage III Foul Odor After Wound Margin: Flat and Intact Slough/Fibrino Exudate Amount: Medium Exudate Type: Serosanguineous Exudate Color: red, brown Wound Bed Granulation Amount: Large (67-100%) Granulation Quality: Red, Pink Fascia Exposed: Necrotic Amount: None Present (0%) Fat Layer (Subcu Tendon Exposed: Muscle Exposed: Joint Exposed: Bone Exposed: Electronic Signature(s) Signed: 07/24/2019 1:57:29 PM By: Mikeal Hawthorne EMT/HBOT Signed: 09/11/2019 6:03:31 PM By: Alexandria Coria RN Entered By: Mikeal Hawthorne on 08/17/ Cleansing: No No Exposed Structure No taneous Tissue) Exposed: Yes No No No No 2020 11:18:16 rea: 80.3% olume: 93.4% n: Small  (1-33%) No No -------------------------------------------------------------------------------- Wound Assessment Details Patient Name: Date of Service: Alexandria, Brooks 07/20/2019 1:45 PM Medical Record [id number] Patient Account Number: [id number] Date of Birth/Sex: Mar 16, 1961 (58 y.o. Female) Treating RN: [name] Primary Care Destinee Taber: [name] Other Clinician: Sandre Brooks Referring Zurie Platas: Treating Trendon Zaring/Extender:[name] Weeks in Treatment: 127 Wound Status Wound Number: 8 Primary Pressure Ulcer Etiology: Wound Location: Right Gluteus Wound Open Wounding Event: Pressure Injury Status: Date Acquired: 04/13/2019 Comorbid Hypertension, Type II Diabetes, Weeks Of Treatment: 14 History: Osteoarthritis, Neuropathy Clustered Wound: No Photos Wound Measurements Length: (cm) 0.8 Width: (cm) 1.4 Depth: (cm) 0.1 Area: (cm) 0.88 Volume: (cm) 0.088 Wound Description Classification: Unstageable/Unclassified Wound Margin: Flat and Intact Exudate Amount: Medium Exudate Type: Serosanguineous Exudate Color: red, brown Wound Bed Granulation Amount: Medium (34-66%) Granulation Quality: Red Necrotic Amount: Medium (34-66%) Necrotic Quality: Adherent Slough After Cleansing: No rino Yes Exposed Structure sed: No Subcutaneous Tissue) Exposed: Yes sed: No sed: No ed: No d: No % Reduction in Area: 63.1% % Reduction in Volume: 63.2% Epithelialization: None Tunneling: No Undermining: No Foul Odor Slough/Fib Fascia Expo Fat Layer ( Tendon Expo Muscle Expo Joint Expos Bone Expose Electronic Signature(s) Signed: 07/24/2019 1:57:29 PM By: Mikeal Hawthorne EMT/HBOT Signed: 09/11/2019 6:03:31 PM By: Alexandria Coria RN Entered By: Mikeal Hawthorne on 07/23/2019 11:18:42 -------------------------------------------------------------------------------- Wound Assessment  Details Patient Name: Date of Service: Alexandria, Brooks 07/20/2019 1:45  PM Medical Record [id number] Patient Account Number: [id number] Date of Birth/Sex: 03-14-1961 (58 y.o. Female) Treating RN: [name] Primary Care Jaequan Propes: [name] Other Clinician: Sandre Brooks Referring Aralyn Nowak: Treating Layal Javid/Extender:[name] Weeks in Treatment: 127 Wound Status Wound Number: 9 Primary Pressure Ulcer Etiology: Wound Location: Right Ischium - Proximal Wound Open Wounding Event: Pressure Injury Status: Date Acquired: 07/12/2019 Comorbid Hypertension, Type II Diabetes, Weeks Of Treatment: 0 History: Osteoarthritis, Neuropathy Clustered Wound: No Photos Wound Measurements Length: (cm) 1 Width: (cm) 1.2 Depth: (cm) 0.1 Area: (cm) 0.942 Volume: (cm) 0.094 Wound Description Classification: Category/Stage II Exudate Amount: Small Exudate Type: Serosanguineous Exudate Color: red, brown Wound Bed Granulation Amount: Large (67-100%) Granulation Quality: Red, Pink Foul Odor After Cleansing: No Slough/Fibrino No Exposed Structure Fascia Exposed: No Fat Layer (Subcutaneous Tissue) Exposed: Ye Tendon Exposed: No Muscle Exposed: No Joint Exposed: No Bone Exposed: No % Reduction in Area: 0% % Reduction in Volume: 0% Tunneling: No Undermining: No s Electronic Signature(s) Signed: 07/24/2019 1:57:29 PM By: Mikeal Hawthorne EMT/HBOT Signed: 09/11/2019 6:03:31 PM By: Alexandria Coria RN Entered By: Mikeal Hawthorne on 07/23/2019 11:19:09 -------------------------------------------------------------------------------- Vitals Details Patient Name: Date of Service: Alexandria, Brooks 07/20/2019 1:45 PM Medical Record [id number] Patient Account Number: [id number] Date of Birth/Sex: 03-23-1961 (58 y.o. Female) Treating RN: [name] Primary Care Munirah Doerner: [name] Other Clinician: Sandre Brooks Referring Yer Olivencia: Treating Shakora Nordquist/Extender:[name] Weeks in Treatment: 127 Vital Signs Time  Taken: 13:50 Temperature (F): 98.2 Height (in): 67 Pulse (bpm): 86 Weight (lbs): 220 Respiratory Rate (breaths/min): 20 Body Mass Index (BMI): 34.5 Blood Pressure (mmHg): 131/91 Reference Range: 80 - 120 mg / dl Electronic Signature(s) Signed: 09/11/2019 6:03:31 PM By: Alexandria Coria RN Entered By: [name] on 07/20/2019 13:56:00

## 2019-09-13 NOTE — Progress Notes (Signed)
Alexandria, Brooks ([id number]) Visit Report for 08/24/2019 Arrival Information Details Patient Name: Date of Service: [name], [name] 08/24/2019 2:15 PM Medical Record [id number] Patient Account Number: [id number] Date of Birth/Sex: Treating RN: Sep 20, 1961 (58 y.o. Female) Kela Millin Primary Care Zavia Pullen: Delia Chimes Other Clinician: Referring Ariel Dimitri: Treating Vanassa Penniman/Extender:Robson, Shirley Muscat, ZOE Weeks in Treatment: 67 Visit Information History Since Last Visit All ordered tests and consults were completed: No Patient Arrived: Wheel Chair Added or deleted any medications: No Arrival Time: 15:23 Any new allergies or adverse reactions: No Accompanied By: self Had a fall or experienced change in No activities of daily living that may affect Transfer Assistance: Harrel Lemon Lift risk of falls: Patient Identification Verified: Yes Signs or symptoms of abuse/neglect since last No Secondary Verification Process Completed: Yes visito Patient Requires Transmission-Based No Hospitalized since last visit: No Precautions: Implantable device outside of the clinic excluding No Patient Has Alerts: No cellular tissue based products placed in the center since last visit: Has Dressing in Place as Prescribed: Yes Pain Present Now: Yes Electronic Signature(s) Signed: 09/13/2019 4:32:31 PM By: Kela Millin Entered By: Kela Millin on 08/24/2019 15:24:00 -------------------------------------------------------------------------------- Clinic Level of Care Assessment Details Patient Name: Date of Service: [name], [name] 08/24/2019 2:15 PM Medical Record [id number] Patient Account Number: [id number] Date of Birth/Sex: Treating RN: 06-08-1961 (58 y.o. Female) Baruch Gouty Primary Care Elman Dettman: Delia Chimes Other Clinician: Referring Boluwatife Mutchler: Treating Dshawn Mcnay/Extender:Robson, Shirley Muscat, ZOE Weeks in Treatment: 132 Clinic Level of Care Assessment  Items TOOL 4 Quantity Score '[]'$  - Use when only an EandM is performed on FOLLOW-UP visit 0 ASSESSMENTS - Nursing Assessment / Reassessment X - Reassessment of Co-morbidities (includes updates in patient status) 1 10 X - Reassessment of Adherence to Treatment Plan 1 5 ASSESSMENTS - Wound and Skin Assessment / Reassessment '[]'$  - Simple Wound Assessment / Reassessment - one wound 0 '[]'$  - Complex Wound Assessment / Reassessment - multiple wounds 0 '[]'$  - Dermatologic / Skin Assessment (not related to wound area) 0 ASSESSMENTS - Focused Assessment '[]'$  - Circumferential Edema Measurements - multi extremities 0 '[]'$  - Nutritional Assessment / Counseling / Intervention 0 '[]'$  - Lower Extremity Assessment (monofilament, tuning fork, pulses) 0 '[]'$  - Peripheral Arterial Disease Assessment (using hand held doppler) 0 ASSESSMENTS - Ostomy and/or Continence Assessment and Care '[]'$  - Incontinence Assessment and Management 0 '[]'$  - Ostomy Care Assessment and Management (repouching, etc.) 0 PROCESS - Coordination of Care X - Simple Patient / Family Education for ongoing care 1 15 '[]'$  - Complex (extensive) Patient / Family Education for ongoing care 0 X - Staff obtains Programmer, systems, Records, Test Results / Process Orders 1 10 '[]'$  - Staff telephones HHA, Nursing Homes / Clarify orders / etc 0 '[]'$  - Routine Transfer to another Facility (non-emergent condition) 0 '[]'$  - Routine Hospital Admission (non-emergent condition) 0 '[]'$  - New Admissions / Biomedical engineer / Ordering NPWT, Apligraf, etc. 0 '[]'$  - Emergency Hospital Admission (emergent condition) 0 X - Simple Discharge Coordination 1 10 '[]'$  - Complex (extensive) Discharge Coordination 0 PROCESS - Special Needs '[]'$  - Pediatric / Minor Patient Management 0 '[]'$  - Isolation Patient Management 0 '[]'$  - Hearing / Language / Visual special needs 0 '[]'$  - Assessment of Community assistance (transportation, D/C planning, etc.) 0 '[]'$  - Additional assistance / Altered mentation 0 '[]'$   - Support Surface(s) Assessment (bed, cushion, seat, etc.) 0 INTERVENTIONS - Wound Cleansing / Measurement X - Simple Wound Cleansing - one wound 1 5 '[]'$  - Complex Wound Cleansing - multiple wounds 0 X -  Wound Imaging (photographs - any number of wounds) 1 5 '[]'$  - Wound Tracing (instead of photographs) 0 X - Simple Wound Measurement - one wound 1 5 '[]'$  - Complex Wound Measurement - multiple wounds 0 INTERVENTIONS - Wound Dressings X - Small Wound Dressing one or multiple wounds 1 10 '[]'$  - Medium Wound Dressing one or multiple wounds 0 '[]'$  - Large Wound Dressing one or multiple wounds 0 X - Application of Medications - topical 1 5 '[]'$  - Application of Medications - injection 0 INTERVENTIONS - Miscellaneous '[]'$  - External ear exam 0 '[]'$  - Specimen Collection (cultures, biopsies, blood, body fluids, etc.) 0 '[]'$  - Specimen(s) / Culture(s) sent or taken to Lab for analysis 0 X - Patient Transfer (multiple staff / Civil Service fast streamer / Similar devices) 1 10 '[]'$  - Simple Staple / Suture removal (25 or less) 0 '[]'$  - Complex Staple / Suture removal (26 or more) 0 '[]'$  - Hypo / Hyperglycemic Management (close monitor of Blood Glucose) 0 '[]'$  - Ankle / Brachial Index (ABI) - do not check if billed separately 0 X - Vital Signs 1 5 Has the patient been seen at the hospital within the last three years: Yes Total Score: 95 Level Of Care: New/Established - Level 3 Electronic Signature(s) Signed: 08/24/2019 5:53:09 PM By: Baruch Gouty RN, BSN Entered By: Baruch Gouty on 08/24/2019 15:56:42 -------------------------------------------------------------------------------- Encounter Discharge Information Details Patient Name: Date of Service: Alexandria, Brooks 08/24/2019 2:15 PM Medical Record [id number] Patient Account Number: [id number] Date of Birth/Sex: Treating RN: [birth year] (58 y.o. Female) Deon Pilling Primary Care Kla Bily: Delia Chimes Other Clinician: Referring Chetan Mehring: Treating  Miko Markwood/Extender:Robson, Shirley Muscat, ZOE Weeks in Treatment: 132 Encounter Discharge Information Items Discharge Condition: Stable Ambulatory Status: Wheelchair Discharge Destination: Home Transportation: Private Auto Accompanied By: self Schedule Follow-up Appointment: Yes Clinical Summary of Care: Electronic Signature(s) Signed: 08/24/2019 5:30:49 PM By: Deon Pilling Entered By: Deon Pilling on 08/24/2019 17:23:39 -------------------------------------------------------------------------------- Lower Extremity Assessment Details Patient Name: Date of Service: [name], [name] 08/24/2019 2:15 PM Medical Record [id number] Patient Account Number: [id number] Date of Birth/Sex: Treating RN: May 26, 1961 (58 y.o. Female) Kela Millin Primary Care Maryfrances Portugal: Delia Chimes Other Clinician: Referring Lorena Clearman: Treating Dody Smartt/Extender:Robson, Shirley Muscat, ZOE Weeks in Treatment: 132 Edema Assessment Assessed: [Left: No] [Right: No] Edema: [Left: N] [Right: o] Electronic Signature(s) Signed: 09/13/2019 4:32:31 PM By: Kela Millin Entered By: Kela Millin on 08/24/2019 15:27:27 -------------------------------------------------------------------------------- Multi Wound Chart Details Patient Name: Date of Service: [name], [name] 08/24/2019 2:15 PM Medical Record [id number] Patient Account Number: [id number] Date of Birth/Sex: Treating RN: November 29, 1961 (58 y.o. Female) Baruch Gouty Primary Care Adaia Matthies: Delia Chimes Other Clinician: Referring Shyann Hefner: Treating Freya Zobrist/Extender:Robson, Shirley Muscat, ZOE Weeks in Treatment: 132 Vital Signs Height(in): 67 Pulse(bpm): 70 Weight(lbs): 220 Blood Pressure(mmHg): 133/61 Body Mass Index(BMI): 34 Temperature(F): 98.4 Respiratory 18 Rate(breaths/min): Photos: [1:No Photos] [8:No Photos] [9:No Photos] Wound Location: [1:Right Ischium] [8:Right Gluteus] [9:Right, Proximal Ischium] Wounding Event:  [1:Pressure Injury] [8:Pressure Injury] [9:Pressure Injury] Primary Etiology: [1:Pressure Ulcer] [8:Pressure Ulcer] [9:Pressure Ulcer] Comorbid History: [1:Hypertension, Type II Diabetes, Osteoarthritis, Neuropathy] [8:Hypertension, Type II Diabetes, Osteoarthritis, Neuropathy] [9:Hypertension, Type II Diabetes, Osteoarthritis, Neuropathy] Date Acquired: [1:12/06/2005] [8:04/13/2019] [9:07/12/2019] Weeks of Treatment: [1:132] [8:19] [9:5] Wound Status: [1:Open] [8:Open] [9:Healed - Epithelialized] Clustered Quantity: [1:2] [8:N/A] [9:N/A] Measurements L x W x D 1x3.5x0.1 [8:0x0x0] [9:0x0x0] (cm) Area (cm) : [1:2.749] [8:0] [9:0] Volume (cm) : [1:0.275] [8:0] [9:0] % Reduction in Area: [1:85.40%] [8:100.00%] [9:100.00%] % Reduction in Volume: 95.10% [8:100.00%] [9:100.00%] Classification: [1:Category/Stage III] [8:Unstageable/Unclassified] [9:Category/Stage II] Exudate Amount: [1:Medium] [8:None  Present] [9:None Present] Exudate Type: [1:Serosanguineous] [8:N/A] [9:N/A] Exudate Color: [1:red, brown] [8:N/A] [9:N/A] Wound Margin: [1:Flat and Intact] [8:Flat and Intact] [9:N/A] Granulation Amount: [1:Large (67-100%)] [8:None Present (0%)] [9:None Present (0%)] Granulation Quality: [1:Red, Pink] [8:N/A] [9:N/A] Necrotic Amount: [1:None Present (0%)] [8:None Present (0%)] [9:None Present (0%)] Exposed Structures: [1:Fat Layer (Subcutaneous Tissue) Exposed: Yes Fascia: No Tendon: No Muscle: No Joint: No Bone: No] [8:Fascia: No Fat Layer (Subcutaneous Tissue) Exposed: No Tendon: No Muscle: No Joint: No Bone: No] [9:Fascia: No Fat Layer (Subcutaneous Tissue)  Exposed: No Tendon: No Muscle: No Joint: No Bone: No] Epithelialization: [1:Small (1-33%) N/A] [8:Large (67-100%) N/A] [9:Large (67-100%) newly epithelialized] Treatment Notes Wound #1 (Right Ischium) 1. Cleanse With Wound Cleanser 3. Primary Dressing Applied Polymem 4. Secondary Dressing ABD Pad 5. Secured With [employer]) Signed: 08/24/2019 5:53:09 PM By: Baruch Gouty RN, BSN Signed: 08/24/2019 6:09:31 PM By: Linton Ham MD Entered By: Linton Ham on 08/24/2019 17:44:35 -------------------------------------------------------------------------------- Multi-Disciplinary Care Plan Details Patient Name: Date of Service: [name], [name] 08/24/2019 2:15 PM Medical Record [id number] Patient Account Number: [id number] Date of Birth/Sex: Treating RN: 1961/08/28 (58 y.o. Female) Baruch Gouty Primary Care Draylon Mercadel: Delia Chimes Other Clinician: Referring Timarie Labell: Treating Bao Coreas/Extender:Robson, Shirley Muscat, ZOE Weeks in Treatment: 132 Active Inactive Pressure Nursing Diagnoses: Knowledge deficit related to causes and risk factors for pressure ulcer development Goals: Patient will remain free from development of additional pressure ulcers Date Initiated: 10/27/2018 Target Resolution Date: 09/21/2019 Goal Status: Active Patient/caregiver will verbalize risk factors for pressure ulcer development Target Resolution Date Initiated: 02/11/2017 Date Inactivated: 10/27/2018 Date: 11/29/2018 Goal Status: Met Interventions: Assess: immobility, friction, shearing, incontinence upon admission and as needed Notes: Wound/Skin Impairment Nursing Diagnoses: Impaired tissue integrity Knowledge deficit related to ulceration/compromised skin integrity Goals: Patient/caregiver will verbalize understanding of skin care regimen Date Initiated: 11/11/2017 Target Resolution Date: 09/21/2019 Goal Status: Active Interventions: Assess patient/caregiver ability to obtain necessary supplies Assess patient/caregiver ability to perform ulcer/skin care regimen upon admission and as needed Assess ulceration(s) every visit Treatment Activities: Patient referred to home care : 11/11/2017 Skin care regimen initiated : 11/11/2017 Topical wound management initiated : 11/11/2017 Notes: Electronic  Signature(s) Signed: 08/24/2019 5:53:09 PM By: Baruch Gouty RN, BSN Entered By: Baruch Gouty on 08/24/2019 15:45:40 -------------------------------------------------------------------------------- Pain Assessment Details Patient Name: Date of Service: [name], [name] 08/24/2019 2:15 PM Medical Record [id number] Patient Account Number: [id number] Date of Birth/Sex: Treating RN: Nov 19, 1961 (58 y.o. Female) Kela Millin Primary Care Special Ranes: Delia Chimes Other Clinician: Referring Davon Folta: Treating Veronda Gabor/Extender:Robson, Shirley Muscat, ZOE Weeks in Treatment: 132 Active Problems Location of Pain Severity and Description of Pain Patient Has Paino Yes Site Locations Pain Location: Pain in Ulcers With Dressing Change: Yes Duration of the Pain. Constant / Intermittento Constant Rate the pain. Current Pain Level: 3 Worst Pain Level: 4 Least Pain Level: 0 Tolerable Pain Level: 3 Character of Pain Describe the Pain: Aching, Burning, Difficult to Pinpoint, Throbbing Pain Management and Medication Current Pain Management: Electronic Signature(s) Signed: 09/13/2019 4:32:31 PM By: Kela Millin Entered By: Kela Millin on 08/24/2019 15:27:20 -------------------------------------------------------------------------------- Patient/Caregiver Education Details Patient Name: Robin Searing 9/18/2020andnbsp2:15 Date of Service: PM Medical Record [id number] Number: Patient Account Number: [id number] Treating RN: Date of Birth/Gender: 1961/09/05 (58 y.o. Baruch Gouty Female) Other Clinician: Primary Care Physician:STALLINGS, ZOE Treating Linton Ham Referring Physician: Physician/Extender: Faylene Kurtz in Treatment: 132 Education Assessment Education Provided To: Patient Education Topics Provided Pressure: Methods: Explain/Verbal Responses: Reinforcements needed, State content correctly Wound/Skin Impairment: Methods:  Explain/Verbal Responses: Reinforcements needed,  State content correctly Electronic Signature(s) Signed: 08/24/2019 5:53:09 PM By: Baruch Gouty RN, BSN Entered By: Baruch Gouty on 08/24/2019 15:46:08 -------------------------------------------------------------------------------- Wound Assessment Details Patient Name: Date of Service: [name], [name] 08/24/2019 2:15 PM Medical Record [id number] Patient Account Number: [id number] Date of Birth/Sex: Treating RN: 1961/07/03 (58 y.o. Female) Kela Millin Primary Care Jovahn Breit: Delia Chimes Other Clinician: Referring Bethani Brugger: Treating Kosisochukwu Burningham/Extender:Robson, Shirley Muscat, ZOE Weeks in Treatment: 132 Wound Status Wound Number: 1 Primary Pressure Ulcer Etiology: Wound Location: Right Ischium Wound Open Wounding Event: Pressure Injury Status: Date Acquired: 12/06/2005 Comorbid Hypertension, Type II Diabetes, Weeks Of Treatment: 132 History: Osteoarthritis, Neuropathy Clustered Wound: No Photos Wound Measurements Length: (cm) 1 Width: (cm) 3.5 Depth: (cm) 0.1 Clustered Quantity: 2 Area: (cm) 2.749 Volume: (cm) 0.275 Wound Description Classification: Category/Stage III Wound Margin: Flat and Intact Exudate Amount: Medium Exudate Type: Serosanguineous Exudate Color: red, brown Wound Bed Granulation Amount: Large (67-100%) Granulation Quality: Red, Pink Necrotic Amount: None Present (0%) After Cleansing: No rino No Exposed Structure sed: No Subcutaneous Tissue) Exposed: Yes sed: No sed: No ed: No d: No % Reduction in Area: 85.4% % Reduction in Volume: 95.1% Epithelialization: Small (1-33%) Tunneling: No Undermining: No Foul Odor Slough/Fib Fascia Expo Fat Layer ( Tendon Expo Muscle Expo Joint Expos Bone Expose Treatment Notes Wound #1 (Right Ischium) 1. Cleanse With Wound Cleanser 3. Primary Dressing Applied Polymem 4. Secondary Dressing ABD Pad 5. Secured With [employer]) Signed: 08/27/2019 9:24:03 AM By: Mikeal Hawthorne EMT/HBOT Signed: 09/13/2019 4:32:31 PM By: Kela Millin Entered By: Mikeal Hawthorne on 08/27/2019 08:50:21 -------------------------------------------------------------------------------- Wound Assessment Details Patient Name: Date of Service: ASPEN, LAWRANCE 08/24/2019 2:15 PM Medical Record [id number] Patient Account Number: [id number] Date of Birth/Sex: Treating RN: [birth year] (58 y.o. Female) Kela Millin Primary Care Hridhaan Yohn: Delia Chimes Other Clinician: Referring Analena Gama: Treating Saylee Sherrill/Extender:Robson, Shirley Muscat, ZOE Weeks in Treatment: 132 Wound Status Wound Number: 8 Primary Pressure Ulcer Etiology: Wound Location: Right Gluteus Wound Healed - Epithelialized Wounding Event: Pressure Injury Status: Date Acquired: 04/13/2019 Comorbid Hypertension, Type II Diabetes, Weeks Of Treatment: 19 History: Osteoarthritis, Neuropathy Clustered Wound: No Photos Wound Measurements Length: (cm) 0 % Reduction Width: (cm) 0 % Reduction Depth: (cm) 0 Epithelializ Area: (cm) 0 Tunneling: Volume: (cm) 0 Undermining Wound Description Classification: Unstageable/Unclassified Foul Odor A Wound Margin: Flat and Intact Slough/Fibr Exudate Amount: None Present Wound Bed Granulation Amount: None Present (0%) Necrotic Amount: None Present (0%) Fascia Expos Fat Layer (S Tendon Expos Muscle Expos Joint Expose Bone Exposed fter Cleansing: No ino No Exposed Structure ed: No ubcutaneous Tissue) Exposed: No ed: No ed: No d: No : No in Area: 100% in Volume: 100% ation: Large (67-100%) No : No Electronic Signature(s) Signed: 08/27/2019 9:24:03 AM By: Mikeal Hawthorne EMT/HBOT Signed: 09/13/2019 4:32:31 PM By: Kela Millin Entered By: Mikeal Hawthorne on 08/27/2019 08:49:49 -------------------------------------------------------------------------------- Wound Assessment  Details Patient Name: Date of Service: [name], [name] 08/24/2019 2:15 PM Medical Record [id number] Patient Account Number: [id number] Date of Birth/Sex: Treating RN: July 26, 1961 (58 y.o. Female) Baruch Gouty Primary Care Kilie Rund: Delia Chimes Other Clinician: Referring Shawnika Pepin: Treating Chong January/Extender:Robson, Shirley Muscat, ZOE Weeks in Treatment: 132 Wound Status Wound Number: 9 Primary Pressure Ulcer Etiology: Wound Location: Right, Proximal Ischium Wound Healed - Epithelialized Wounding Event: Pressure Injury Status: Date Acquired: 07/12/2019 Comorbid Hypertension, Type II Diabetes, Weeks Of Treatment: 5 History: Osteoarthritis, Neuropathy Clustered Wound: No Wound Measurements Length: (cm) 0 % Reduction Width: (cm) 0 % Reduction Depth: (cm) 0 Epitheliali Area: (cm) 0 Tunneling: Volume: (cm) 0 Underminin  Wound Description Classification: Category/Stage II Foul Odor Exudate Amount: None Present Slough/Fib After Cleansing: No rino No in Area: 100% in Volume: 100% zation: Large (67-100%) No g: No Wound Bed Granulation Amount: None Present (0%) Exposed Structure Necrotic Amount: None Present (0%) Fascia Exposed: No Fat Layer (Subcutaneous Tissue) Exposed: No Tendon Exposed: No Muscle Exposed: No Joint Exposed: No Bone Exposed: No Assessment Notes newly epithelialized Electronic Signature(s) Signed: 08/24/2019 5:53:09 PM By: Baruch Gouty RN, BSN Entered By: Baruch Gouty on 08/24/2019 15:54:15 -------------------------------------------------------------------------------- [place] Details Patient Name: Date of Service: [name], [name] 08/24/2019 2:15 PM Medical Record [id number] Patient Account Number: [id number] Date of Birth/Sex: Treating RN: Oct 22, 1961 (58 y.o. Female) Kela Millin Primary Care Deseree Zemaitis: Delia Chimes Other Clinician: Referring Zilpha Mcandrew: Treating Tonishia Steffy/Extender:Robson, Shirley Muscat, ZOE Weeks in  Treatment: 132 Vital Signs Time Taken: 15:15 Temperature (F): 98.4 Height (in): 67 Pulse (bpm): 70 Weight (lbs): 220 Respiratory Rate (breaths/min): 18 Body Mass Index (BMI): 34.5 Blood Pressure (mmHg): 133/61 Reference Range: 80 - 120 mg / dl Electronic Signature(s) Signed: 09/13/2019 4:32:31 PM By: Kela Millin Entered By: Kela Millin on 08/24/2019 15:26:40

## 2019-09-14 DIAGNOSIS — N318 Other neuromuscular dysfunction of bladder: Secondary | ICD-10-CM | POA: Diagnosis not present

## 2019-09-21 ENCOUNTER — Other Ambulatory Visit: Payer: Self-pay

## 2019-09-21 ENCOUNTER — Encounter (HOSPITAL_BASED_OUTPATIENT_CLINIC_OR_DEPARTMENT_OTHER): Payer: Medicare Other | Attending: Internal Medicine | Admitting: Internal Medicine

## 2019-09-21 DIAGNOSIS — E1140 Type 2 diabetes mellitus with diabetic neuropathy, unspecified: Secondary | ICD-10-CM | POA: Diagnosis not present

## 2019-09-21 DIAGNOSIS — G35 Multiple sclerosis: Secondary | ICD-10-CM | POA: Insufficient documentation

## 2019-09-21 DIAGNOSIS — I10 Essential (primary) hypertension: Secondary | ICD-10-CM | POA: Insufficient documentation

## 2019-09-21 DIAGNOSIS — L89319 Pressure ulcer of right buttock, unspecified stage: Secondary | ICD-10-CM | POA: Diagnosis not present

## 2019-09-21 DIAGNOSIS — L89313 Pressure ulcer of right buttock, stage 3: Secondary | ICD-10-CM | POA: Diagnosis not present

## 2019-09-24 NOTE — Progress Notes (Signed)
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 Her ooHer right ischial tuberosity is still using an alginate perhaps slight improvement in dimensions and conditions of the wound bed. Situation here is complicated by surrounding scar tissue 09/16/17; dorsal right foot wound is healed. I changed her 3 weeks ago from Santyl to silver collagen to the area on the right buttock. There is no change in dimensions are 10/06/17; she has a continued area on the right buttock. I changed her to silver collagen 3 weeks ago although it appears that she has been receiving silver alginate. The area here I think was in our transcription the home health not home health misinterpretation of our orders 10/21/17; continued linear area on the right buttock. I changed her to silver collagen several weeks ago. She has new satellite lesions around the wound. In discussion with the patient she is sometimes up in her wheelchair for 6 hours, this is certainly enough to undo any healing and to cause other ulceration.There may be also excessive moisture 11/11/17; the linear area on her buttock. She had a satellite lesion near this however it appears to of closed however she has for linear satellite areas more laterally over the  ischial tuberosity. All of these had necrotic debris on the surface that required debridement. She tells me she is lessening amount of time that she spends in the wheelchair to a maximum of 3 hours and unfortunately even this may be excessive. There is some improvement in her original wound 11/25/17; patient with fairly advanced multiple sclerosis she is  nonambulatory and wheelchair bound. She tells me that she is spending less consistent time in her wheelchair. She continues to have pressure ulcers on her right buttock the original wound which is a small linear wound and now 3 other satellite lesions today. She has scar tissue in this area. We have been using silver alginate 12/16/17; patient with fairly advanced multiple sclerosis who is essentially nonambulatory and wheelchair bound. She has pressure ulcers on the right buttock. We've been using silver alginate largely under the thought that the areas were moist and in full some tissue but we've really not been making any progress. She had some satellite lesions last time that seemed to have settled down from 3-1 laterally 01/05/18; non-ambulatory patient with multiple sclerosis. Pressure ulcer on the right buttock. She had a satellite lesion more laterally as well. We've been using Hydrofera Blue. I'm not sure about the pressure-relief 01/20/18; patient with advanced multiple sclerosis. She tells me she is spending less time in the wheelchair and trying to shift her weight off her buttocks while she was in the chair. The areas that actually are larger this week we've been using Hydrofera Blue for about the past month. 02/16/18 patient with advanced multiple sclerosis I follow her monthly. She has a history of multiple pressure ulcers over her bilateral buttocks most of which have healed but we've been dealing with a difficult area in the fold of her buttock just under her ischial tuberosity. We've not been making much headway. Today she arrives  with the original wound in the crease of her gluteal fold longer. Above this she has 3 necrotic areas and above this another satellite area which has a healthy surface. I think this is due to unrelieved pressure probably sitting in her wheelchair 03/17/18; this is a patient with advanced multiple sclerosis. I follow her monthly largely for palliative wounds on her right buttock. We have not been making much headway. We had people out to look at her wheelchair cushion and they're apparently seeing if they can update this. Patient tells me she is spending 2 hours in the chair and then an hour in bed. The wounds are really no better perhaps slightly larger. 04/13/18; patient arrives for her monthly visit. She has advanced multiple sclerosis and is wheelchair bound. She has 3 open areas on the right buttock one in the fold between the buttock and the upper thigh, one above this and one above that. The 2 more distal wounds are measuring smaller. The surface of the wounds does not look too bad. There is a large amount of scar tissue around these wounds from previous wound in this area. She tells me she is up in her wheelchair for 2 hours and then puts her sock back to bed. We have attempted to get her a offloading cushion for her wheelchair and apparently double come in next week 05/11/18; monthly follow-up. This is a patient is wheelchair bound secondary to multiple sclerosis. When she was here a month ago she had 3 open areas on the right buttock today she has 6 on the same scar tissue from previous wounds. None of the wounds themselves look particularly ominous.We've been using silver collagen. I think this is all a pressure relief issue. I've talked to her about this again. We had the cushion people about the look at the cushion on her chair apparently is fine she just simply puts to much weight on the right buttock, it may be a balance issue 06/09/18; monthly follow-up. Patient  I follow palliatively who  is wheelchair bound from multiple sclerosis. When she was here last time she had 6 wounds on the same scar tissue from previous wounds in the lower right buttock. I think some of the wounds of cold last into a larger wound. These are stage III pressure ulcers. She has smaller areas below this and a single pressure area above this 07/06/18; monthly follow-up. I follow this patient palliatively who is wheelchair bound for multiple sclerosis. She has 2 open areas which is somewhat better than we described a month or so ago. These are on the right lower buttock. She has a large wound just at the upper part of her thigh and then a smaller wounds superiorly. I think the larger wound was a coalesced wound from smaller wounds so so thinks may not be completely better. We've been using silver collagen 08/03/18; monthly follow-up. This is a patient who is wheelchair-bound because of multiple sclerosis. She has 2 wound areas on the right by Dr. and one on the right gluteal fold. Both of these covered and necrotic debris this time. We've been using silver collagen. I don't think there is enough offloading here and I talked to her each time about this. This would include limiting her wheelchair time. Propping her buttocks off the wheelchair with her arms that she is strong enough 08/31/2018; monthly follow-up. This is a patient who is wheelchair-bound because of multiple sclerosis. She has 2 wounds in the lower right buttock area and one small one superiorly. Although this looks better than last time. She has been using silver collagen 09/29/2018; monthly follow-up. This is a patient is wheelchair-bound because of multiple sclerosis she has 2 wounds in the lower right buttock and one superiorly. The one superiorly I think is close this week we have been using silver collagen although the area looks moist and all probably changed to silver alginate 10/27/2018; monthly follow-up. Patient is wheelchair-bound because  of multiple sclerosis she reports no new changes in her status. She tells me she is up for 6 hours a day in the wheelchair. With regards to her wounds the wound superiorly is closed. She has a small area in the mid buttock and then the linear wound with with and a small open area underneath this. This is all a lot better we are using silver collagen 11/24/18; 1 month follow-up. Patient is wheelchair-bound because of multiple sclerosis. Her wounds are on the right buttock. In general these have been improving we've been using soap or collagen. The remaining areas are close to the right gluteal fold 1/24; 1 month follow-up. Wheelchair-bound because of multiple sclerosis. More extensive wounds on the lower right buttock than last time. She says these are painful. She takes states that she is up 3 to 4 hours a time in a wheelchair. She is also complaining about her wheelchair cushion. Been using silver collagen to this area and up until this appointment she has been doing quite a bit better with healing of several wounds more proximally. I think this is probably an offloading issue 3/12; have not seen this patient in a fairly long time. She is wheelchair-bound because of multiple sclerosis. Wounds on the right lower buttock. I think this is an offloading issue. She has 2 wounds one right in the gluteal fold and one slightly above it. The area slightly above it was part of her original wound complex where is the gluteal fold wounds have been more recent. 4/10; 1 month follow-up. The  patient has a new wound on the upper left buttock in close proximity to the coccyx. This is a nonadherent surface. Patient states that this is been there for about a week. The area on the right gluteal fold is worse. There is the original area superiorly but now a deep crepitus along the fold. There is no clear infection. The skin is very wet and macerated. The patient states that this is because of bladder spasms causing  urethral leakage in spite of her suprapubic catheter. She tells me that she is only on this for 2 hours and then goes back into bed 5/8; 1 month follow-up. The new wound from last month on the left is closed over. She appears to have a new stage II wound on the upper right gluteal. The area in the lower right great heel in the gluteal fold is worse however and it is also developing some depth. The patient is using silver alginate. Her daughter changes the dressing. Apparently they have to change it twice a day because there are bladder spasms around her Foley catheter causing urinary wetness. Apparently this is been fully looked at by urology 6/12; 1 month follow-up. The patient has 2 wounds on the right buttock 1 inferiorly more just below the ischial tuberosity and one more superiorly. This is surrounded by scar tissue from previous wounds. We have been using silver alginate for a long period. She has a Foley catheter that leaks i.e. bladder spasms and she has to change the dressing twice a day which limits what I can try to use on these areas. A lot of this is probably an adequate pressure relief 7/10; 1 month follow-up. 2 wounds on the right buttock. One inferiorly linearly across the right initial tuberosity and one more superiorly. The superior wound is down to a very superficial wound. We have been using PolyMem. She states that nothing is really changed including her up in the wheelchair time 8/14-1 month follow-up, the 2 wounds on the right buttock, the one lower down remains the same, the one more superior where the brief is cutting across this area looks superficial. We will continue to use PolyMem 9/18; 1 month follow-up. The patient is down to 1 wound on the right buttock in the gluteal fold. This is really improved we continue to use PolyMem Ag 10/16 1 month follow-up. She has 1 wound on the right buttock in the gluteal fold where is the gluteal fold. She has been using PolyMem.  The wound is measuring smaller Objective Constitutional Sitting or standing Blood Pressure is within target range for patient.. Pulse regular and within target range for patient.Marland Kitchen Respirations regular, non-labored and within target range.. Temperature is normal and within the target range for the patient.Marland Kitchen Appears in no distress. Vitals Time Taken: 2:17 PM, Height: 67 in, Weight: 220 lbs, BMI: 34.5, Temperature: 98.4 F, Pulse: 70 bpm, Respiratory Rate: 18 breaths/min, Blood Pressure: 137/61 mmHg. General Notes: The patient only has 1 wound remaining in the right gluteal fold. Clean looking surface. Dimensions are down both in length and width. Where is the gluteal fold where is the gluteal cleft. No evidence of infection Integumentary (Hair, Skin) Wound #1 status is Open. Original cause of wound was Pressure Injury. The wound is located on the Right Ischium. The wound measures 0.5cm length x 2.8cm width x 0.2cm depth; 1.1cm^2 area and 0.22cm^3 volume. There is Fat Layer (Subcutaneous Tissue) Exposed exposed. There is no tunneling or undermining noted. There is a medium amount  of serosanguineous drainage noted. The wound margin is flat and intact. There is large (67-100%) red, pink granulation within the wound bed. There is no necrotic tissue within the wound bed. Assessment Active Problems ICD-10 Pressure ulcer of right buttock, stage 3 Multiple sclerosis Plan Follow-up Appointments: Return appointment in 1 month. - **Room 5 - [facility]** Dressing Change Frequency: Wound #1 Right Ischium: Change dressing every day. Skin Barriers/Peri-Wound Care: Wound #1 Right Ischium: Barrier cream - zinc oxide cream to macerated periwound as needed Wound Cleansing: Wound #1 Right Ischium: May shower and wash wound with soap and water. - with dressing change Primary Wound Dressing: Wound #1 Right Ischium: Polymem Secondary Dressing: Wound #1 Right Ischium: ABD pad - secure with  tape Off-Loading: Wound #1 Right Ischium: Low air-loss mattress (Group 2) Turn and reposition every 2 hours Other: - may be up in chair for no more than 2 hour increments 1. I am continuing polymen she seems to have done very well with this. Electronic Signature(s) Signed: 09/21/2019 5:33:39 PM By: Linton Ham MD Entered By: Linton Ham on 09/21/2019 14:44:55 -------------------------------------------------------------------------------- SuperBill Details Patient Name: Date of Service: [name], [name] 09/21/2019 Medical Record [id number] Patient Account Number: [id number] Date of Birth/Sex: Treating RN: February 07, 1961 (58 y.o. Nancy Fetter Primary Care Provider: Delia Chimes Other Clinician: Referring Provider: Treating Provider/Extender:Robson, Shirley Muscat, ZOE Weeks in Treatment: 136 Diagnosis Coding ICD-10 Codes Code Description [id number] Pressure ulcer of right buttock, stage 3 G35 Multiple sclerosis Facility Procedures CPT4 Code: [id number] Description: [id number] - WOUND CARE VISIT-LEV 3 EST PT Modifier: Quantity: 1 Physician Procedures Electronic Signature(s) Signed: 09/21/2019 6:00:55 PM By: Levan Hurst RN, BSN Signed: 09/24/2019 5:54:03 PM By: Linton Ham MD Previous Signature: 09/21/2019 5:33:39 PM Version By: Linton Ham MD Entered By: Levan Hurst on 09/21/2019 17:55:35

## 2019-10-04 ENCOUNTER — Telehealth: Payer: Self-pay | Admitting: Family Medicine

## 2019-10-04 DIAGNOSIS — Z79891 Long term (current) use of opiate analgesic: Secondary | ICD-10-CM

## 2019-10-04 DIAGNOSIS — G35 Multiple sclerosis: Secondary | ICD-10-CM

## 2019-10-04 NOTE — Telephone Encounter (Signed)
[facility] called said they faxed over script in oct. Have not heard back please advise   Phone:[phone] fax: [phone]

## 2019-10-05 DIAGNOSIS — N318 Other neuromuscular dysfunction of bladder: Secondary | ICD-10-CM | POA: Diagnosis not present

## 2019-10-10 ENCOUNTER — Other Ambulatory Visit: Payer: Self-pay | Admitting: Family Medicine

## 2019-10-11 ENCOUNTER — Other Ambulatory Visit: Payer: Self-pay | Admitting: Family Medicine

## 2019-10-11 NOTE — Telephone Encounter (Signed)
Forwarding medication refill requests to the clinical pool for review. 

## 2019-10-15 ENCOUNTER — Telehealth: Payer: Self-pay | Admitting: Family Medicine

## 2019-10-15 ENCOUNTER — Other Ambulatory Visit: Payer: Self-pay | Admitting: Family Medicine

## 2019-10-15 DIAGNOSIS — Z79891 Long term (current) use of opiate analgesic: Secondary | ICD-10-CM

## 2019-10-15 DIAGNOSIS — G35 Multiple sclerosis: Secondary | ICD-10-CM

## 2019-10-15 NOTE — Telephone Encounter (Signed)
Medication: oxyCODONE-acetaminophen (PERCOCET) 10-325 MG tablet [id number]   Has the patient contacted their pharmacy? Yes  (Agent: If no, request that the patient contact the pharmacy for the refill.) (Agent: If yes, when and what did the pharmacy advise?)  Preferred Pharmacy (with phone number or street name): [facility], [place] [phone] (Phone) [phone] (Fax)    Agent: Please be advised that RX refills may take up to 3 business days. We ask that you follow-up with your pharmacy.

## 2019-10-15 NOTE — Telephone Encounter (Signed)
Patient would like a call back from Dr. Bridget Hartshorn or her CMA about a request she ask PCP about a hospital bed and mattress that she needs. Her cap social worker Dellia Cloud from [facility] can be reached with any questions about this to [phone].

## 2019-10-15 NOTE — Telephone Encounter (Signed)
Requested medication (s) are due for refill today: yes  Requested medication (s) are on the active medication list: yes  Last refill:  09/07/2019  Future visit scheduled: yes  Notes to clinic:  Refill cannot be delegated    Requested Prescriptions  Pending Prescriptions Disp Refills   oxyCODONE-acetaminophen (PERCOCET) 10-325 MG tablet 90 tablet 0    Sig: Take 1 tablet by mouth every 8 (eight) hours as needed for pain.     Not Delegated - Analgesics:  Opioid Agonist Combinations Failed - 10/15/2019  1:25 PM      Failed - This refill cannot be delegated      Failed - Urine Drug Screen completed in last 360 days.      Passed - Valid encounter within last 6 months    Recent Outpatient Visits          3 months ago Multiple sclerosis ([place])   Primary Care at [facility], [name], MD   8 months ago Chronically on opiate therapy   Primary Care at [facility], [name], MD   1 year ago Chronically on opiate therapy   Primary Care at [facility], [place] A, MD   1 year ago Type 2 diabetes mellitus with complication, without long-term current use of insulin [facility])   Primary Care at [facility], [name], MD   2 years ago Screening for cervical cancer   Primary Care at [place], MD      Future Appointments            In 2 months Forrest Moron, MD Primary Care at [place], [facility]

## 2019-10-17 ENCOUNTER — Other Ambulatory Visit: Payer: Self-pay | Admitting: *Deleted

## 2019-10-17 MED ORDER — OXYCODONE-ACETAMINOPHEN 10-325 MG PO TABS: 1.0000 | ORAL_TABLET | Freq: Three times a day (TID) | ORAL | 0 refills | Status: DC | PRN

## 2019-10-17 NOTE — Telephone Encounter (Signed)
Faxed prescription over  To [phone] For [facility] lift [employer] along with office notes

## 2019-10-20 ENCOUNTER — Other Ambulatory Visit: Payer: Self-pay | Admitting: Family Medicine

## 2019-10-20 MED ORDER — POTASSIUM CHLORIDE CRYS ER 10 MEQ PO TBCR: 30.0000 meq | EXTENDED_RELEASE_TABLET | Freq: Two times a day (BID) | ORAL | 3 refills | Status: DC

## 2019-10-26 ENCOUNTER — Other Ambulatory Visit: Payer: Self-pay

## 2019-10-26 ENCOUNTER — Encounter (HOSPITAL_BASED_OUTPATIENT_CLINIC_OR_DEPARTMENT_OTHER): Payer: Medicare Other | Attending: Internal Medicine | Admitting: Internal Medicine

## 2019-10-26 DIAGNOSIS — G35 Multiple sclerosis: Secondary | ICD-10-CM | POA: Diagnosis not present

## 2019-10-26 DIAGNOSIS — E11622 Type 2 diabetes mellitus with other skin ulcer: Secondary | ICD-10-CM | POA: Insufficient documentation

## 2019-10-26 DIAGNOSIS — L89313 Pressure ulcer of right buttock, stage 3: Secondary | ICD-10-CM | POA: Insufficient documentation

## 2019-10-26 DIAGNOSIS — M1990 Unspecified osteoarthritis, unspecified site: Secondary | ICD-10-CM | POA: Diagnosis not present

## 2019-10-26 DIAGNOSIS — Z993 Dependence on wheelchair: Secondary | ICD-10-CM | POA: Insufficient documentation

## 2019-10-26 DIAGNOSIS — E1140 Type 2 diabetes mellitus with diabetic neuropathy, unspecified: Secondary | ICD-10-CM | POA: Insufficient documentation

## 2019-10-26 DIAGNOSIS — I10 Essential (primary) hypertension: Secondary | ICD-10-CM | POA: Diagnosis not present

## 2019-10-26 DIAGNOSIS — E1151 Type 2 diabetes mellitus with diabetic peripheral angiopathy without gangrene: Secondary | ICD-10-CM | POA: Insufficient documentation

## 2019-10-26 DIAGNOSIS — L89312 Pressure ulcer of right buttock, stage 2: Secondary | ICD-10-CM | POA: Diagnosis not present

## 2019-10-26 NOTE — Telephone Encounter (Signed)
Spoke with pt and she confirmed that she does not need any oxygen Rx. She states she does no know why this was requested.

## 2019-10-29 NOTE — Telephone Encounter (Signed)
Pt requesting call back asap  In regards the [place] lift    [phone]

## 2019-10-29 NOTE — Progress Notes (Signed)
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 alginate. No major change in this. Complicated by surrounding scar tissue a previous pressure ulcerations. 08/26/17; dorsal aspect of her right foot wound which we have been using Santyl has a clean granulated base with advancing epithelialization. I don't think Santyl required. Her ooHer right ischial tuberosity is still using an alginate perhaps slight improvement in dimensions and conditions of the wound bed. Situation here is complicated by surrounding scar tissue 09/16/17; dorsal right foot wound is healed. I changed her 3 weeks ago from Santyl to silver collagen to the area on the right buttock. There is no change in dimensions are 10/06/17; she has a continued area on the right buttock. I changed her to silver collagen 3 weeks ago although it appears that she has been receiving silver alginate. The area here I think was in our transcription the home health not home health misinterpretation of our orders 10/21/17; continued linear area on the right buttock. I changed her to silver collagen several weeks ago. She has new satellite lesions around the wound. In discussion with the patient she is  sometimes up in her wheelchair for 6 hours, this is certainly enough to undo any healing and to cause other ulceration.There may be also excessive moisture 11/11/17; the linear area on her buttock. She had a satellite lesion near this however it appears to of closed however she has for  linear satellite areas more laterally over the ischial tuberosity. All of these had necrotic debris on the surface that required debridement. She tells me she is lessening amount of time that she spends in the wheelchair to a maximum of 3 hours and unfortunately even this may be excessive. There is some improvement in her original wound 11/25/17; patient with fairly advanced multiple sclerosis she is nonambulatory and wheelchair bound. She tells me that she is spending less consistent time in her wheelchair. She continues to have pressure ulcers on her right buttock the original wound which is a small linear wound and now 3 other satellite lesions today. She has scar tissue in this area. We have been using silver alginate 12/16/17; patient with fairly advanced multiple sclerosis who is essentially nonambulatory and wheelchair bound. She has pressure ulcers on the right buttock. We've been using silver alginate largely under the thought that the areas were moist and in full some tissue but we've really not been making any progress. She had some satellite lesions last time that seemed to have settled down from 3-1 laterally 01/05/18; non-ambulatory patient with multiple sclerosis. Pressure ulcer on the right buttock. She had a satellite lesion more laterally as well. We've been using Hydrofera Blue. I'm not sure about the pressure-relief 01/20/18; patient with advanced multiple sclerosis. She tells me she is spending less time in the wheelchair and trying to shift her weight off her buttocks while she was in the chair. The areas that actually are larger this week we've been using Hydrofera Blue for about the past  month. 02/16/18 patient with advanced multiple sclerosis I follow her monthly. She has a history of multiple pressure ulcers over her bilateral buttocks most of which have healed but we've been dealing with a difficult area in the fold of her buttock just under her ischial tuberosity. We've not been making much headway. Today she arrives with the original wound in the crease of her gluteal fold longer. Above this she has 3 necrotic areas and above this another satellite area which has a healthy surface. I think this is due to unrelieved pressure probably sitting in her wheelchair 03/17/18; this is a patient with advanced multiple sclerosis. I follow her monthly largely for palliative wounds on her right buttock. We have not been making much headway. We had people out to look at her wheelchair cushion and they're apparently seeing if they can update this. Patient tells me she is spending 2 hours in the chair and then an hour in bed. The wounds are really no better perhaps slightly larger. 04/13/18; patient arrives for her monthly visit. She has advanced multiple sclerosis and is wheelchair bound. She has 3 open areas on the right buttock one in the fold between the buttock and the upper thigh, one above this and one above that. The 2 more distal wounds are measuring smaller. The surface of the wounds does not look too bad. There is a large amount of scar tissue around these wounds from previous wound in this area. She tells me she is up in her wheelchair for 2 hours and then puts her sock back to bed. We have attempted to get her a offloading cushion for her wheelchair and apparently double come in next week 05/11/18; monthly follow-up. This is a patient is wheelchair bound secondary to multiple sclerosis. When she was here a month ago she had 3 open areas on the right buttock today she has 6 on the same scar tissue from previous wounds.  None of the wounds themselves look particularly ominous.We've been  using silver collagen. I think this is all a pressure relief issue. I've talked to her about this again. We had the cushion people about the look at the cushion on her chair apparently is fine she just simply puts to much weight on the right buttock, it may be a balance issue 06/09/18; monthly follow-up. Patient I follow palliatively who is wheelchair bound from multiple sclerosis. When she was here last time she had 6 wounds on the same scar tissue from previous wounds in the lower right buttock. I think some of the wounds of cold last into a larger wound. These are stage III pressure ulcers. She has smaller areas below this and a single pressure area above this 07/06/18; monthly follow-up. I follow this patient palliatively who is wheelchair bound for multiple sclerosis. She has 2 open areas which is somewhat better than we described a month or so ago. These are on the right lower buttock. She has a large wound just at the upper part of her thigh and then a smaller wounds superiorly. I think the larger wound was a coalesced wound from smaller wounds so so thinks may not be completely better. We've been using silver collagen 08/03/18; monthly follow-up. This is a patient who is wheelchair-bound because of multiple sclerosis. She has 2 wound areas on the right by Dr. and one on the right gluteal fold. Both of these covered and necrotic debris this time. We've been using silver collagen. I don't think there is enough offloading here and I talked to her each time about this. This would include limiting her wheelchair time. Propping her buttocks off the wheelchair with her arms that she is strong enough 08/31/2018; monthly follow-up. This is a patient who is wheelchair-bound because of multiple sclerosis. She has 2 wounds in the lower right buttock area and one small one superiorly. Although this looks better than last time. She has been using silver collagen 09/29/2018; monthly follow-up. This is a  patient is wheelchair-bound because of multiple sclerosis she has 2 wounds in the lower right buttock and one superiorly. The one superiorly I think is close this week we have been using silver collagen although the area looks moist and all probably changed to silver alginate 10/27/2018; monthly follow-up. Patient is wheelchair-bound because of multiple sclerosis she reports no new changes in her status. She tells me she is up for 6 hours a day in the wheelchair. With regards to her wounds the wound superiorly is closed. She has a small area in the mid buttock and then the linear wound with with and a small open area underneath this. This is all a lot better we are using silver collagen 11/24/18; 1 month follow-up. Patient is wheelchair-bound because of multiple sclerosis. Her wounds are on the right buttock. In general these have been improving we've been using soap or collagen. The remaining areas are close to the right gluteal fold 1/24; 1 month follow-up. Wheelchair-bound because of multiple sclerosis. More extensive wounds on the lower right buttock than last time. She says these are painful. She takes states that she is up 3 to 4 hours a time in a wheelchair. She is also complaining about her wheelchair cushion. Been using silver collagen to this area and up until this appointment she has been doing quite a bit better with healing of several wounds more proximally. I think this is probably an offloading issue 3/12; have not seen this patient in  a fairly long time. She is wheelchair-bound because of multiple sclerosis. Wounds on the right lower buttock. I think this is an offloading issue. She has 2 wounds one right in the gluteal fold and one slightly above it. The area slightly above it was part of her original wound complex where is the gluteal fold wounds have been more recent. 4/10; 1 month follow-up. The patient has a new wound on the upper left buttock in close proximity to the  coccyx. This is a nonadherent surface. Patient states that this is been there for about a week. The area on the right gluteal fold is worse. There is the original area superiorly but now a deep crepitus along the fold. There is no clear infection. The skin is very wet and macerated. The patient states that this is because of bladder spasms causing urethral leakage in spite of her suprapubic catheter. She tells me that she is only on this for 2 hours and then goes back into bed 5/8; 1 month follow-up. The new wound from last month on the left is closed over. She appears to have a new stage II wound on the upper right gluteal. The area in the lower right great heel in the gluteal fold is worse however and it is also developing some depth. The patient is using silver alginate. Her daughter changes the dressing. Apparently they have to change it twice a day because there are bladder spasms around her Foley catheter causing urinary wetness. Apparently this is been fully looked at by urology 6/12; 1 month follow-up. The patient has 2 wounds on the right buttock 1 inferiorly more just below the ischial tuberosity and one more superiorly. This is surrounded by scar tissue from previous wounds. We have been using silver alginate for a long period. She has a Foley catheter that leaks i.e. bladder spasms and she has to change the dressing twice a day which limits what I can try to use on these areas. A lot of this is probably an adequate pressure relief 7/10; 1 month follow-up. 2 wounds on the right buttock. One inferiorly linearly across the right initial tuberosity and one more superiorly. The superior wound is down to a very superficial wound. We have been using PolyMem. She states that nothing is really changed including her up in the wheelchair time 8/14-1 month follow-up, the 2 wounds on the right buttock, the one lower down remains the same, the one more superior where the brief is cutting across  this area looks superficial. We will continue to use PolyMem 9/18; 1 month follow-up. The patient is down to 1 wound on the right buttock in the gluteal fold. This is really improved we continue to use PolyMem Ag 10/16 1 month follow-up. She has 1 wound on the right buttock in the gluteal fold where is the gluteal fold. She has been using PolyMem. The wound is measuring smaller 11/20; 1 month follow-up. The area in question is right on the gluteal fold perhaps half an inch in length very narrow healthy looking tissue she has been using polymen. Truthfully I think she is offloading this more aggressively than she has in the past Objective Constitutional Patient is hypertensive.. Pulse regular and within target range for patient.Marland Kitchen Respirations regular, non-labored and within target range.. Temperature is normal and within the target range for the patient.Marland Kitchen Appears in no distress. Vitals Time Taken: 2:12 PM, Height: 67 in, Source: Stated, Weight: 220 lbs, Source: Stated, BMI: 34.5, Temperature: 98.8 F, Pulse: 77  bpm, Respiratory Rate: 18 breaths/min, Blood Pressure: 149/77 mmHg. General Notes: Wound exam; one small area in the right gluteal fold. This is come down quite a bit in the last month. Tissue looks healthy here. Unfortunately it is in a crease. We will need to put the polymen in with gauze to keep the crease open. Otherwise I think this is on the way to healing. There is no erythema Integumentary (Hair, Skin) Wound #1 status is Open. Original cause of wound was Pressure Injury. The wound is located on the Right Ischium. The wound measures 0.4cm length x 1.8cm width x 0.1cm depth; 0.565cm^2 area and 0.057cm^3 volume. There is Fat Layer (Subcutaneous Tissue) Exposed exposed. There is no tunneling or undermining noted. There is a small amount of serosanguineous drainage noted. The wound margin is flat and intact. There is large (67-100%) red, friable granulation within the wound bed. There  is no necrotic tissue within the wound bed. Assessment Active Problems ICD-10 Pressure ulcer of right buttock, stage 3 Multiple sclerosis Plan Follow-up Appointments: Return appointment in 1 month. - **Room 5 - [facility]** Dressing Change Frequency: Wound #1 Right Ischium: Change dressing every day. Skin Barriers/Peri-Wound Care: Wound #1 Right Ischium: Barrier cream - zinc oxide cream to macerated periwound as needed Wound Cleansing: Wound #1 Right Ischium: May shower and wash wound with soap and water. - with dressing change Primary Wound Dressing: Wound #1 Right Ischium: Polymem Secondary Dressing: Wound #1 Right Ischium: Dry Gauze - roll to spread gluteal fold ABD pad - secure with tape Off-Loading: Wound #1 Right Ischium: Low air-loss mattress (Group 2) Turn and reposition every 2 hours Other: - may be up in chair for no more than 2 hour increments 1. Continue with PolyMem Ag 2. She is using ABD pads I believe over the top 3. Much better. Follow-up in 1 month Electronic Signature(s) Signed: 10/26/2019 6:05:38 PM By: Linton Ham MD Entered By: Linton Ham on 10/26/2019 14:51:06 -------------------------------------------------------------------------------- SuperBill Details Patient Name: Date of Service: RENYA, KASLER 10/26/2019 Medical Record [id number] Patient Account Number: [id number] Date of Birth/Sex: Treating RN: August 10, 1961 (58 y.o. [name] Primary Care Provider: Delia Brooks Other Clinician: Referring Provider: Treating Provider/Extender:Sonia Stickels, Shirley Muscat, ZOE Weeks in Treatment: 141 Diagnosis Coding ICD-10 Codes Code Description L89.313 Pressure ulcer of right buttock, stage 3 G35 Multiple sclerosis Facility Procedures CPT4 Code: [id number] Description: [id number] - WOUND CARE VISIT-LEV 3 EST PT Modifier: Quantity: 1 Physician Procedures CPT4 Code: [id number] Description: [id number] - WC PHYS LEVEL 2 - EST PT ICD-10 Diagnosis  Description L89.313 Pressure ulcer of right buttock, stage 3 G35 Multiple sclerosis Modifier: Quantity: 1 Electronic Signature(s) Signed: 10/26/2019 6:05:38 PM By: Linton Ham MD Entered By: Linton Ham on 10/26/2019 14:51:19

## 2019-10-30 NOTE — Progress Notes (Signed)
[name], [name] ([id number]) Visit Report for 10/26/2019 Arrival Information Details Patient Name: Date of Service: [name], [name] 10/26/2019 2:15 PM Medical Record [id number] Patient Account Number: [id number] Date of Birth/Sex: Treating RN: 08-30-1961 (58 y.o. Elam Dutch Primary Care Kroy Sprung: Delia Chimes Other Clinician: Referring Ezabella Teska: Treating Nikaela Coyne/Extender:Robson, Shirley Muscat, ZOE Weeks in Treatment: 15 Visit Information History Since Last Visit Added or deleted any medications: No Patient Arrived: Wheel Chair Any new allergies or adverse reactions: No Arrival Time: 14:12 Had a fall or experienced change in No activities of daily living that may affect Accompanied By: self risk of falls: Transfer Assistance: Harrel Lemon Lift Signs or symptoms of abuse/neglect since last No Patient Identification Verified: Yes visito Secondary Verification Process Completed: Yes Hospitalized since last visit: No Patient Requires Transmission-Based No Implantable device outside of the clinic excluding No Precautions: cellular tissue based products placed in the center Patient Has Alerts: No since last visit: Has Dressing in Place as Prescribed: Yes Pain Present Now: Yes Electronic Signature(s) Signed: 10/30/2019 1:35:50 PM By: Baruch Gouty RN, BSN Entered By: Baruch Gouty on 10/26/2019 14:12:42 -------------------------------------------------------------------------------- Clinic Level of Care Assessment Details Patient Name: Date of Service: [name], [name] 10/26/2019 2:15 PM Medical Record [id number] Patient Account Number: [id number] Date of Birth/Sex: Treating RN: [birth year] (58 y.o. [name] Primary Care Ajna Moors: Delia Chimes Other Clinician: Referring Derrell Milanes: Treating Esperansa Sarabia/Extender:Robson, Shirley Muscat, ZOE Weeks in Treatment: 141 Clinic Level of Care Assessment Items TOOL 4 Quantity Score X - Use when only an EandM is  performed on FOLLOW-UP visit 1 0 ASSESSMENTS - Nursing Assessment / Reassessment X - Reassessment of Co-morbidities (includes updates in patient status) 1 10 X - Reassessment of Adherence to Treatment Plan 1 5 ASSESSMENTS - Wound and Skin Assessment / Reassessment X - Simple Wound Assessment / Reassessment - one wound 1 5 _0  - Complex Wound Assessment / Reassessment - multiple wounds 0 _1  - Dermatologic / Skin Assessment (not related to wound area) 0 ASSESSMENTS - Focused Assessment _2  - Circumferential Edema Measurements - multi extremities 0 _3  - Nutritional Assessment / Counseling / Intervention 0 _4  - Lower Extremity Assessment (monofilament, tuning fork, pulses) 0 _5  - Peripheral Arterial Disease Assessment (using hand held doppler) 0 ASSESSMENTS - Ostomy and/or Continence Assessment and Care _6  - Incontinence Assessment and Management 0 _7  - Ostomy Care Assessment and Management (repouching, etc.) 0 PROCESS - Coordination of Care X - Simple Patient / Family Education for ongoing care 1 15 _8  - Complex (extensive) Patient / Family Education for ongoing care 0 X - Staff obtains Programmer, systems, Records, Test Results / Process Orders 1 10 _9  - Staff telephones HHA, Nursing Homes / Clarify orders / etc 0 _10  - Routine Transfer to another Facility (non-emergent condition) 0 _11  - Routine Hospital Admission (non-emergent condition) 0 _12  - New Admissions / Biomedical engineer / Ordering NPWT, Apligraf, etc. 0 _13  - Emergency Hospital Admission (emergent condition) 0 X - Simple Discharge Coordination 1 10 _14  - Complex (extensive) Discharge Coordination 0 PROCESS - Special Needs _15  - Pediatric / Minor Patient Management 0 _16  - Isolation Patient Management 0 _17  - Hearing / Language / Visual special needs 0 _18  - Assessment of Community assistance (transportation, D/C planning, etc.) 0 _19  - Additional assistance / Altered mentation 0 _20  - Support Surface(s) Assessment (bed, cushion, seat, etc.)  0 INTERVENTIONS - Wound Cleansing / Measurement X - Simple Wound Cleansing - one wound 1 5 _21  - Complex Wound Cleansing - multiple wounds 0 X - Wound Imaging (photographs -  any number of wounds) 1 5 _0  - Wound Tracing (instead of photographs) 0 X - Simple Wound Measurement - one wound 1 5 _1  - Complex Wound Measurement - multiple wounds 0 INTERVENTIONS - Wound Dressings X - Small Wound Dressing one or multiple wounds 1 10 _2  - Medium Wound Dressing one or multiple wounds 0 _3  - Large Wound Dressing one or multiple wounds 0 X - Application of Medications - topical 1 5 <[id number]>_6<\/[id number]>_1  - Application of Medications - injection 0 INTERVENTIONS - Miscellaneous _5  - External ear exam 0 _6  - Specimen Collection (cultures, biopsies, blood, body fluids, etc.) 0 _7  - Specimen(s) / Culture(s) sent or taken to Lab for analysis 0 _8  - Patient Transfer (multiple staff / Civil Service fast streamer / Similar devices) 0 _9  - Simple Staple / Suture removal (25 or less) 0 _10  - Complex Staple / Suture removal (26 or more) 0 _11  - Hypo / Hyperglycemic Management (close monitor of Blood Glucose) 0 _12  - Ankle / Brachial Index (ABI) - do not check if billed separately 0 X - Vital Signs 1 5 Has the patient been seen at the hospital within the last three years: Yes Total Score: 90 Level Of Care: New/Established - Level 3 Electronic Signature(s) Signed: 10/29/2019 2:17:56 PM By: Kela Millin Entered By: Kela Millin on 10/26/2019 14:46:23 -------------------------------------------------------------------------------- Lower Extremity Assessment Details Patient Name: Date of Service: [name], [name] 10/26/2019 2:15 PM Medical Record [id number] Patient Account Number: [id number] Date of Birth/Sex: Treating RN: Apr 05, 1961 (58 y.o. Elam Dutch Primary Care Jourdyn Hasler: Delia Chimes Other Clinician: Referring Yulian Gosney: Treating Junko Ohagan/Extender:Robson, Shirley Muscat, ZOE Weeks in Treatment: 141 Electronic  Signature(s) Signed: 10/30/2019 1:35:50 PM By: Baruch Gouty RN, BSN Entered By: Baruch Gouty on 10/26/2019 14:15:26 -------------------------------------------------------------------------------- Multi Wound Chart Details Patient Name: Date of Service: [name], [name] 10/26/2019 2:15 PM Medical Record [id number] Patient Account Number: [id number] Date of Birth/Sex: Treating RN: July 09, 1961 (58 y.o. [name] Primary Care Simranjit Thayer: Delia Chimes Other Clinician: Referring Shelia Kingsberry: Treating Rihaan Barrack/Extender:Robson, Shirley Muscat, ZOE Weeks in Treatment: 141 Vital Signs Height(in): 67 Pulse(bpm): 77 Weight(lbs): 220 Blood Pressure(mmHg): 149/77 Body Mass Index(BMI): 34 Temperature(F): 98.8 Respiratory 18 Rate(breaths/min): Photos: [1:No Photos] [N/A:N/A] Wound Location: [1:Right Ischium] [N/A:N/A] Wounding Event: [1:Pressure Injury] [N/A:N/A] Primary Etiology: [1:Pressure Ulcer] [N/A:N/A] Comorbid History: [1:Hypertension, Type II Diabetes, Osteoarthritis, Neuropathy] [N/A:N/A] Date Acquired: [1:12/06/2005] [N/A:N/A] Weeks of Treatment: [1:141] [N/A:N/A] Wound Status: [1:Open] [N/A:N/A] Clustered Quantity: [1:2] [N/A:N/A] Measurements L x W x D 0.4x1.8x0.1 [N/A:N/A] (cm) Area (cm) : [1:0.565] [N/A:N/A] Volume (cm) : [1:0.057] [N/A:N/A] % Reduction in Area: [1:97.00%] [N/A:N/A] % Reduction in Volume: 99.00% [N/A:N/A] Classification: [1:Category/Stage III] [N/A:N/A] Exudate Amount: [1:Small] [N/A:N/A] Exudate Type: [1:Serosanguineous] [N/A:N/A] Exudate Color: [1:red, brown] [N/A:N/A] Wound Margin: [1:Flat and Intact] [N/A:N/A] Granulation Amount: [1:Large (67-100%)] [N/A:N/A] Granulation Quality: [1:Red, Friable] [N/A:N/A] Necrotic Amount: [1:None Present (0%)] [N/A:N/A] Exposed Structures: [1:Fat Layer (Subcutaneous Tissue) Exposed: Yes Fascia: No Tendon: No Muscle: No Joint: No Bone: No Small (1-33%)] [N/A:N/A N/A] Treatment Notes Electronic  Signature(s) Signed: 10/26/2019 6:05:38 PM By: Linton Ham MD Signed: 10/29/2019 2:17:56 PM By: Kela Millin Entered By: Linton Ham on 10/26/2019 14:49:02 -------------------------------------------------------------------------------- Multi-Disciplinary Care Plan Details Patient Name: Date of Service: [name], [name] 10/26/2019 2:15 PM Medical Record [id number] Patient Account Number: [id number] Date of Birth/Sex: Treating RN: 06/11/1961 (58 y.o. [name] Primary Care Pasqualina Colasurdo: Delia Chimes Other Clinician: Referring Yaquelin Langelier: Treating Chrishawna Farina/Extender:Robson, Shirley Muscat, ZOE Weeks in Treatment: 141 Active Inactive Pressure Nursing Diagnoses: Knowledge deficit related to causes and risk factors for pressure ulcer development Goals:  Patient will remain free from development of additional pressure ulcers Date Initiated: 10/27/2018 Target Resolution Date: 11/23/2019 Goal Status: Active Patient/caregiver will verbalize risk factors for pressure ulcer development Date Inactivated: 10/27/2018 Target Resolution Date Initiated: 02/11/2017 Date: 11/29/2018 Goal Status: Met Interventions: Assess: immobility, friction, shearing, incontinence upon admission and as needed Notes: Wound/Skin Impairment Nursing Diagnoses: Impaired tissue integrity Knowledge deficit related to ulceration/compromised skin integrity Goals: Patient/caregiver will verbalize understanding of skin care regimen Date Initiated: 11/11/2017 Target Resolution Date: 11/23/2019 Goal Status: Active Interventions: Assess patient/caregiver ability to obtain necessary supplies Assess patient/caregiver ability to perform ulcer/skin care regimen upon admission and as needed Assess ulceration(s) every visit Treatment Activities: Patient referred to home care : 11/11/2017 Skin care regimen initiated : 11/11/2017 Topical wound management initiated : 11/11/2017 Notes: Electronic  Signature(s) Signed: 10/29/2019 2:17:56 PM By: Kela Millin Entered By: Kela Millin on 10/26/2019 14:38:28 -------------------------------------------------------------------------------- Pain Assessment Details Patient Name: Date of Service: [name], [name] 10/26/2019 2:15 PM Medical Record [id number] Patient Account Number: [id number] Date of Birth/Sex: Treating RN: 21-Jul-1961 (58 y.o. Elam Dutch Primary Care Sanjay Broadfoot: Delia Chimes Other Clinician: Referring Jaylanie Boschee: Treating Jimmy Stipes/Extender:Robson, Shirley Muscat, ZOE Weeks in Treatment: 141 Active Problems Location of Pain Severity and Description of Pain Patient Has Paino Yes Site Locations Pain Location: Generalized Pain With Dressing Change: No Duration of the Pain. Constant / Intermittento Constant Rate the pain. Current Pain Level: 3 Character of Pain Describe the Pain: Aching Pain Management and Medication Current Pain Management: Notes reports generalized overall pain Electronic Signature(s) Signed: 10/30/2019 1:35:50 PM By: Baruch Gouty RN, BSN Entered By: Baruch Gouty on 10/26/2019 14:15:19 -------------------------------------------------------------------------------- Patient/Caregiver Education Details Patient Name: Date of Service: [name] 11/20/2020andnbsp2:15 PM Medical Record [id number] Patient Account Number: [id number] Date of Birth/Gender: 06/02/1961 (58 y.o. F) Treating RN: Kela Millin Primary Care Physician: Delia Chimes Other Clinician: Referring Physician: Treating Physician/Extender:Robson, Shirley Muscat, ZOE Weeks in Treatment: 141 Education Assessment Education Provided To: Patient Education Topics Provided Wound/Skin Impairment: Methods: Explain/Verbal Responses: State content correctly Electronic Signature(s) Signed: 10/29/2019 2:17:56 PM By: Kela Millin Entered By: Kela Millin on 10/26/2019  14:38:43 -------------------------------------------------------------------------------- Wound Assessment Details Patient Name: Date of Service: [name], [name] 10/26/2019 2:15 PM Medical Record [id number] Patient Account Number: [id number] Date of Birth/Sex: Treating RN: [birth year] (58 y.o. Elam Dutch Primary Care Braxton Weisbecker: Delia Chimes Other Clinician: Referring Kenna Kirn: Treating Shanita Kanan/Extender:Robson, Shirley Muscat, ZOE Weeks in Treatment: 141 Wound Status Wound Number: 1 Primary Pressure Ulcer Etiology: Wound Location: Right Ischium Wound Open Wounding Event: Pressure Injury Status: Date Acquired: 12/06/2005 Comorbid Hypertension, Type II Diabetes, Weeks Of Treatment: 141 History: Osteoarthritis, Neuropathy Clustered Wound: No Photos Wound Measurements Length: (cm) 0.4 % Reduction in Width: (cm) 1.8 % Reduction in Depth: (cm) 0.1 Epithelializat Clustered Quantity: 2 Tunneling: Area: (cm) 0.565 Undermining: Volume: (cm) 0.057 Wound Description Classification: Category/Stage III Foul Odor Aft Wound Margin: Flat and Intact Slough/Fibrin Exudate Amount: Small Exudate Type: Serosanguineous Exudate Color: red, brown Wound Bed Granulation Amount: Large (67-100%) Granulation Quality: Red, Friable Fascia Exposed Necrotic Amount: None Present (0%) Fat Layer (Sub Tendon Exposed Muscle Exposed Joint Exposed: Bone Exposed: Electronic Signature(s) Signed: 10/30/2019 7:37:04 AM By: Mikeal Hawthorne EMT/HBOT Signed: 10/30/2019 1:35:50 PM By: Baruch Gouty RN, BSN Entered By: Mikeal Hawthorne on 11/23/ er Cleansing: No o No Exposed Structure : No cutaneous Tissue) Exposed: Yes : No : No No No 2020 08:03:38 Area: 97% Volume: 99% ion: Small (1-33%) No No -------------------------------------------------------------------------------- Vitals Details Patient Name: Date of Service: [name], [name] 10/26/2019 2:15 PM Medical Record  [id number] Patient Account Number: [id number] Date of Birth/Sex: Treating RN: 04/03/1961 (58 y.o. Elam Dutch Primary Care Kayton Dunaj: Delia Chimes Other Clinician: Referring Hatley Henegar: Treating Nechemia Chiappetta/Extender:Robson, Shirley Muscat, ZOE Weeks in Treatment: 141 Vital Signs Time Taken: 14:12 Temperature (F): 98.8 Height (in): 67 Pulse (bpm): 77 Source: Stated Respiratory Rate (breaths/min): 18 Weight (lbs): 220 Blood Pressure (mmHg): 149/77 Source: Stated Reference Range: 80 - 120 mg / dl Body Mass Index (BMI): 34.5 Electronic Signature(s) Signed: 10/30/2019 1:35:50 PM By: Baruch Gouty RN, BSN Entered By: Baruch Gouty on 10/26/2019 14:13:21

## 2019-10-30 NOTE — Telephone Encounter (Signed)
I have called the pt back and informed her that we have faxed notes and the rx on 10/17/19.   I have attempted to call the social worker and there was no answer.  Pt reports that she is now sleeping on the floor and doesn't know the status of the bed and hoya lift.   Attempted to call [facility] with no response. [phone]

## 2019-10-31 DIAGNOSIS — N318 Other neuromuscular dysfunction of bladder: Secondary | ICD-10-CM | POA: Diagnosis not present

## 2019-11-08 ENCOUNTER — Other Ambulatory Visit: Payer: Self-pay | Admitting: Family Medicine

## 2019-11-09 IMAGING — MG DIGITAL SCREENING BILATERAL MAMMOGRAM WITH CAD
4 series · 4 of 4 positions shown · non-contrast
Comparison: None.

CLINICAL DATA: Screening. Patient is in a wheelchair and can not
stand for images.

EXAM:
DIGITAL SCREENING BILATERAL MAMMOGRAM WITH CAD

[L CC]
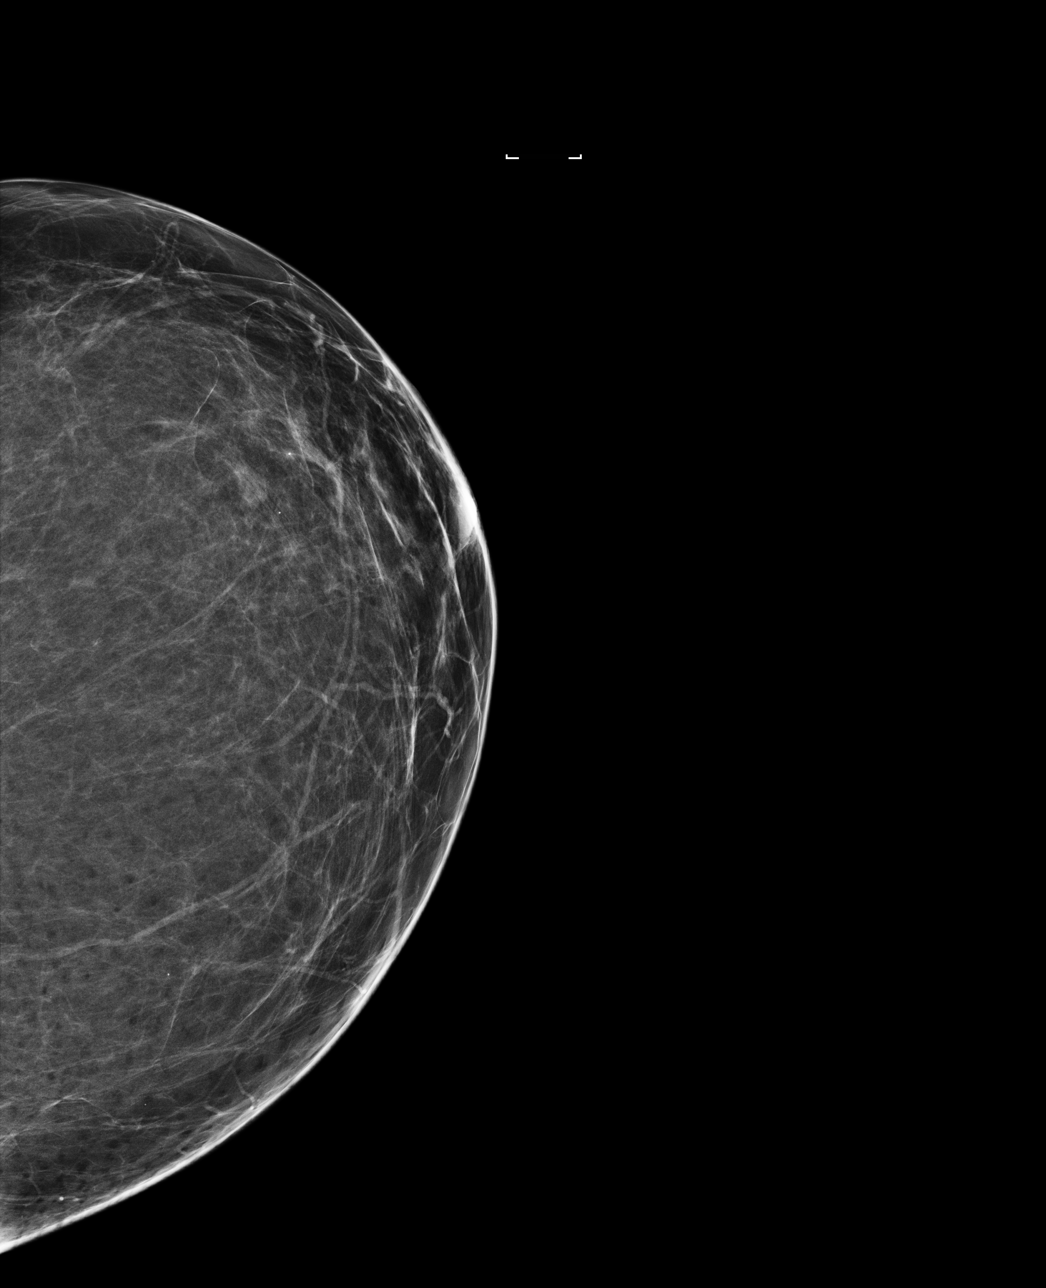

[R CC]
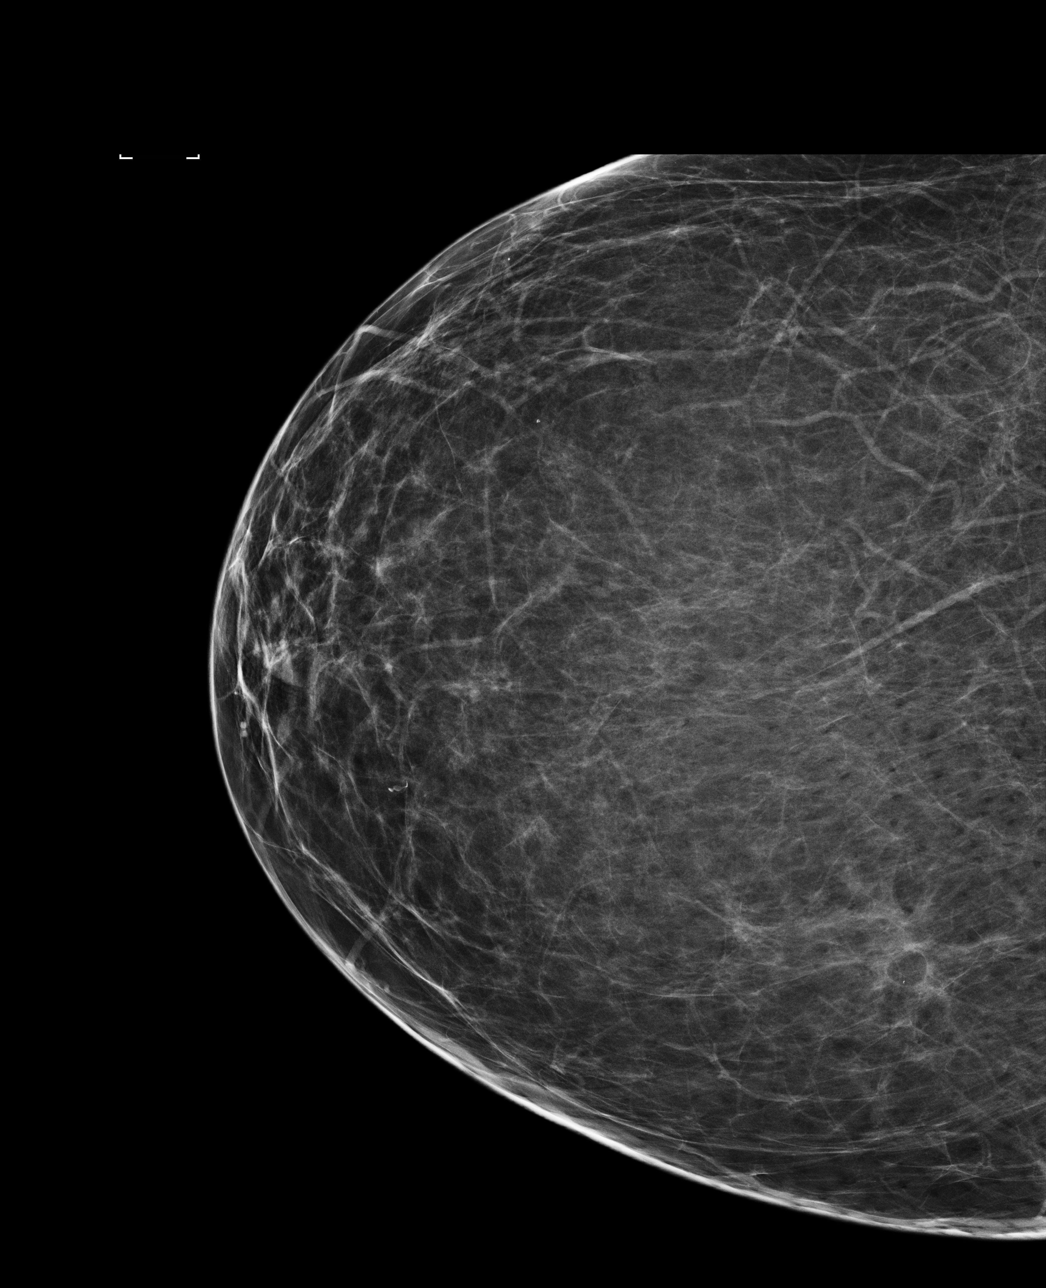

[L MLO]
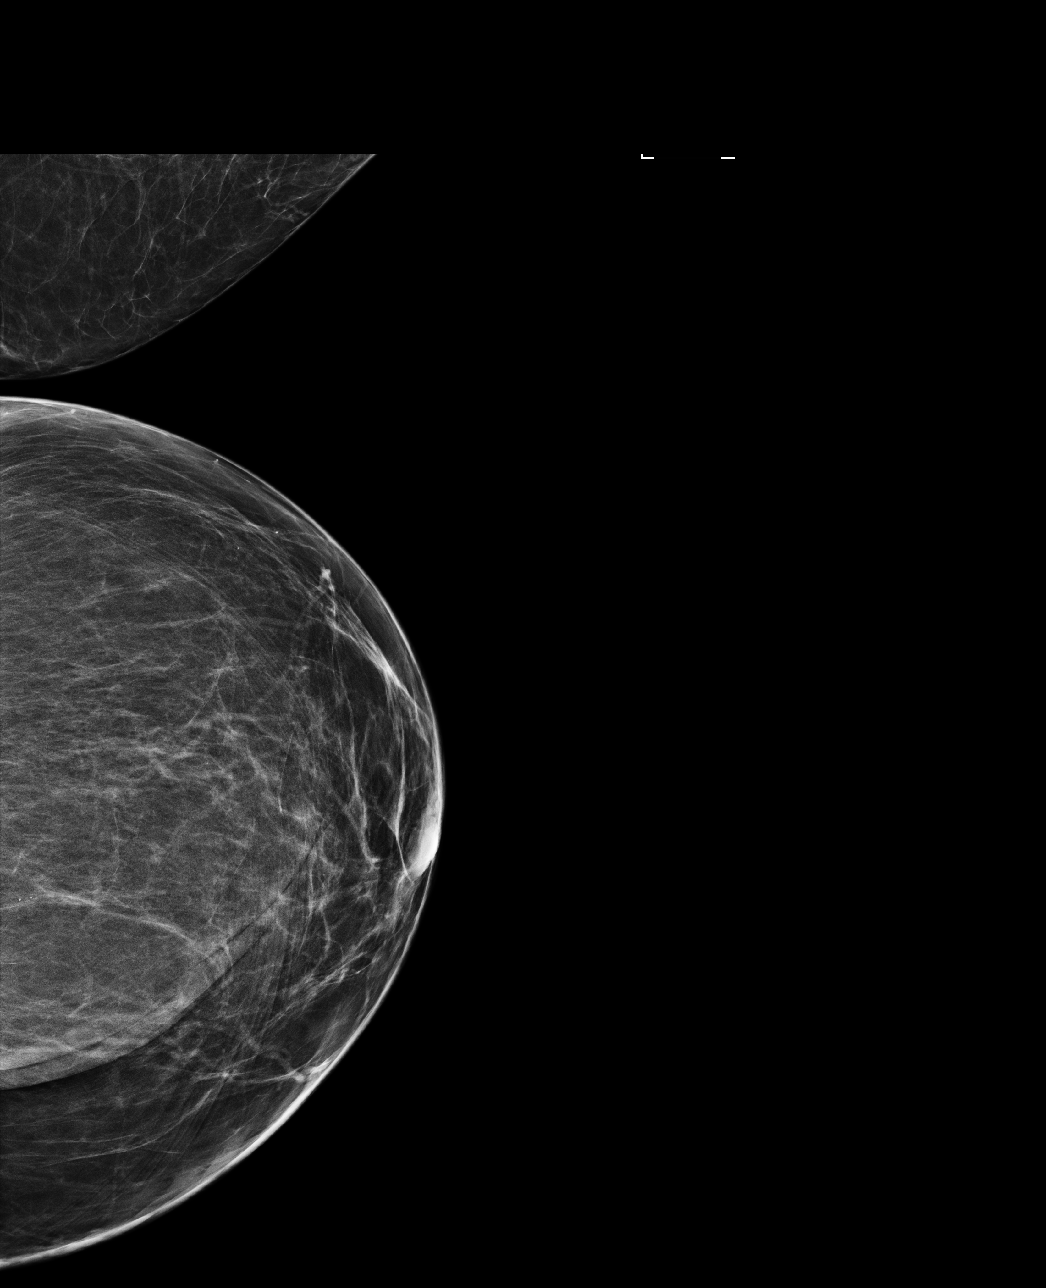

[R MLO]
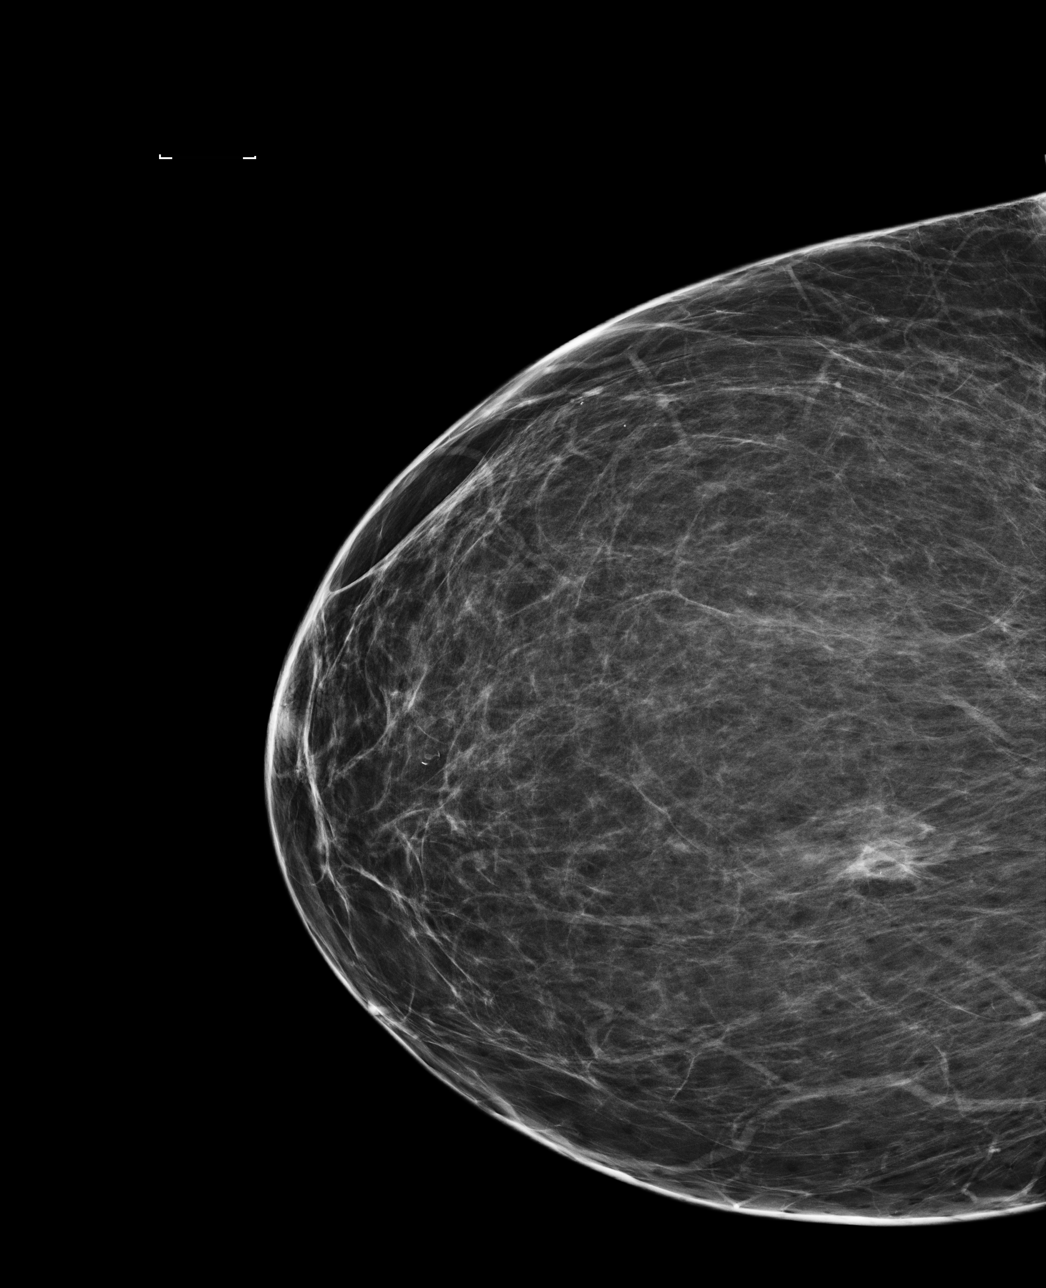

[4 of 4 positions shown; findings below may reference images not displayed]

ACR Breast Density Category b: There are scattered areas of
fibroglandular density.
FINDINGS: Technically challenging exam due to patient's limited mobility. Some
of the posterior breast tissue could not be included on the images.
There are no findings suspicious for malignancy. Images were
processed with CAD.
IMPRESSION: No mammographic evidence of malignancy. Some of the breast tissue is
excluded from the images due to patient's limited mobility. A result
letter of this screening mammogram will be mailed directly to the
patient.

RECOMMENDATION:
Screening mammogram in one year. (Code:AS-L-A6Q)

BI-RADS CATEGORY  1: Negative.

## 2019-11-13 NOTE — Progress Notes (Signed)
[name], [name] ([id number]) Visit Report for 09/21/2019 Arrival Information Details Patient Name: Date of Service: [name], [name] 09/21/2019 2:00 PM Medical Record [id number] Patient Account Number: [id number] Date of Birth/Sex: Treating RN: Feb 19, 1961 (58 y.o. Orvan Falconer Primary Care Ondria Oswald: Delia Chimes Other Clinician: Referring Babak Lucus: Treating Marteze Vecchio/Extender:Robson, Shirley Muscat, ZOE Weeks in Treatment: 28 Visit Information History Since Last Visit All ordered tests and consults were completed: No Patient Arrived: Wheel Chair Added or deleted any medications: No Arrival Time: 14:17 Any new allergies or adverse reactions: No Accompanied By: self Had a fall or experienced change in No activities of daily living that may affect Transfer Assistance: Harrel Lemon Lift risk of falls: Patient Identification Verified: Yes Signs or symptoms of abuse/neglect since last No Secondary Verification Process Completed: Yes visito Patient Requires Transmission-Based No Hospitalized since last visit: No Precautions: Implantable device outside of the clinic excluding No Patient Has Alerts: No cellular tissue based products placed in the center since last visit: Has Dressing in Place as Prescribed: Yes Pain Present Now: Yes Electronic Signature(s) Signed: 11/13/2019 3:01:15 PM By: Carlene Coria RN Entered By: Carlene Coria on 09/21/2019 14:17:32 -------------------------------------------------------------------------------- Clinic Level of Care Assessment Details Patient Name: Date of Service: JAYDYNN, WOLFORD 09/21/2019 2:00 PM Medical Record [id number] Patient Account Number: [id number] Date of Birth/Sex: Treating RN: [birth year] (58 y.o. Nancy Fetter Primary Care Amberrose Friebel: Delia Chimes Other Clinician: Referring Canyon Lohr: Treating Vela Render/Extender:Robson, Shirley Muscat, ZOE Weeks in Treatment: 136 Clinic Level of Care Assessment Items TOOL 4 Quantity  Score X - Use when only an EandM is performed on FOLLOW-UP visit 1 0 ASSESSMENTS - Nursing Assessment / Reassessment X - Reassessment of Co-morbidities (includes updates in patient status) 1 10 X - Reassessment of Adherence to Treatment Plan 1 5 ASSESSMENTS - Wound and Skin Assessment / Reassessment X - Simple Wound Assessment / Reassessment - one wound 1 5 '[]'$  - Complex Wound Assessment / Reassessment - multiple wounds 0 '[]'$  - Dermatologic / Skin Assessment (not related to wound area) 0 ASSESSMENTS - Focused Assessment '[]'$  - Circumferential Edema Measurements - multi extremities 0 '[]'$  - Nutritional Assessment / Counseling / Intervention 0 '[]'$  - Lower Extremity Assessment (monofilament, tuning fork, pulses) 0 '[]'$  - Peripheral Arterial Disease Assessment (using hand held doppler) 0 ASSESSMENTS - Ostomy and/or Continence Assessment and Care '[]'$  - Incontinence Assessment and Management 0 '[]'$  - Ostomy Care Assessment and Management (repouching, etc.) 0 PROCESS - Coordination of Care X - Simple Patient / Family Education for ongoing care 1 15 '[]'$  - Complex (extensive) Patient / Family Education for ongoing care 0 X - Staff obtains Programmer, systems, Records, Test Results / Process Orders 1 10 '[]'$  - Staff telephones HHA, Nursing Homes / Clarify orders / etc 0 '[]'$  - Routine Transfer to another Facility (non-emergent condition) 0 '[]'$  - Routine Hospital Admission (non-emergent condition) 0 '[]'$  - New Admissions / Biomedical engineer / Ordering NPWT, Apligraf, etc. 0 '[]'$  - Emergency Hospital Admission (emergent condition) 0 X - Simple Discharge Coordination 1 10 '[]'$  - Complex (extensive) Discharge Coordination 0 PROCESS - Special Needs '[]'$  - Pediatric / Minor Patient Management 0 '[]'$  - Isolation Patient Management 0 '[]'$  - Hearing / Language / Visual special needs 0 '[]'$  - Assessment of Community assistance (transportation, D/C planning, etc.) 0 '[]'$  - Additional assistance / Altered mentation 0 '[]'$  - Support Surface(s)  Assessment (bed, cushion, seat, etc.) 0 INTERVENTIONS - Wound Cleansing / Measurement X - Simple Wound Cleansing - one wound 1 5 '[]'$  - Complex Wound Cleansing - multiple  wounds 0 X - Wound Imaging (photographs - any number of wounds) 1 5 '[]'$  - Wound Tracing (instead of photographs) 0 X - Simple Wound Measurement - one wound 1 5 '[]'$  - Complex Wound Measurement - multiple wounds 0 INTERVENTIONS - Wound Dressings X - Small Wound Dressing one or multiple wounds 1 10 '[]'$  - Medium Wound Dressing one or multiple wounds 0 '[]'$  - Large Wound Dressing one or multiple wounds 0 X - Application of Medications - topical 1 5 '[]'$  - Application of Medications - injection 0 INTERVENTIONS - Miscellaneous '[]'$  - External ear exam 0 '[]'$  - Specimen Collection (cultures, biopsies, blood, body fluids, etc.) 0 '[]'$  - Specimen(s) / Culture(s) sent or taken to Lab for analysis 0 '[]'$  - Patient Transfer (multiple staff / Civil Service fast streamer / Similar devices) 0 '[]'$  - Simple Staple / Suture removal (25 or less) 0 '[]'$  - Complex Staple / Suture removal (26 or more) 0 '[]'$  - Hypo / Hyperglycemic Management (close monitor of Blood Glucose) 0 '[]'$  - Ankle / Brachial Index (ABI) - do not check if billed separately 0 X - Vital Signs 1 5 Has the patient been seen at the hospital within the last three years: Yes Total Score: 90 Level Of Care: New/Established - Level 3 Electronic Signature(s) Signed: 09/21/2019 6:00:55 PM By: Levan Hurst RN, BSN Entered By: Levan Hurst on 09/21/2019 17:55:26 -------------------------------------------------------------------------------- Encounter Discharge Information Details Patient Name: Date of Service: [name], [name] 09/21/2019 2:00 PM Medical Record [id number] Patient Account Number: [id number] Date of Birth/Sex: Treating RN: [birth year] (58 y.o. [name] Primary Care Macon Lesesne: Delia Chimes Other Clinician: Referring Orlandria Kissner: Treating Nguyen Butler/Extender:Robson, Shirley Muscat,  ZOE Weeks in Treatment: 136 Encounter Discharge Information Items Discharge Condition: Stable Ambulatory Status: Walker Discharge Destination: Home Transportation: Other Accompanied By: self Schedule Follow-up Appointment: Yes Clinical Summary of Care: Patient Declined Electronic Signature(s) Signed: 09/25/2019 6:16:57 PM By: Kela Millin Entered By: Kela Millin on 09/21/2019 15:12:09 -------------------------------------------------------------------------------- Multi Wound Chart Details Patient Name: Date of Service: [name], [name] 09/21/2019 2:00 PM Medical Record [id number] Patient Account Number: [id number] Date of Birth/Sex: Treating RN: 1961-03-01 (58 y.o. Nancy Fetter Primary Care Dasani Thurlow: Delia Chimes Other Clinician: Referring Mera Gunkel: Treating Francisco Eyerly/Extender:Robson, Shirley Muscat, ZOE Weeks in Treatment: 136 Vital Signs Height(in): 67 Pulse(bpm): 70 Weight(lbs): 220 Blood Pressure(mmHg): 137/61 Body Mass Index(BMI): 34 Temperature(F): 98.4 Respiratory 18 Rate(breaths/min): Photos: [1:No Photos] [N/A:N/A] Wound Location: [1:Right Ischium] [N/A:N/A] Wounding Event: [1:Pressure Injury] [N/A:N/A] Primary Etiology: [1:Pressure Ulcer] [N/A:N/A] Comorbid History: [1:Hypertension, Type II Diabetes, Osteoarthritis, Neuropathy] [N/A:N/A] Date Acquired: [1:12/06/2005] [N/A:N/A] Weeks of Treatment: [1:136] [N/A:N/A] Wound Status: [1:Open] [N/A:N/A] Clustered Quantity: [1:2] [N/A:N/A] Measurements L x W x D 0.5x2.8x0.2 [N/A:N/A] (cm) Area (cm) : [1:1.1] [N/A:N/A] Volume (cm) : [1:0.22] [N/A:N/A] % Reduction in Area: [1:94.20%] [N/A:N/A] % Reduction in Volume: 96.10% [N/A:N/A] Classification: [1:Category/Stage III] [N/A:N/A] Exudate Amount: [1:Medium] [N/A:N/A] Exudate Type: [1:Serosanguineous] [N/A:N/A] Exudate Color: [1:red, brown] [N/A:N/A] Wound Margin: [1:Flat and Intact] [N/A:N/A] Granulation Amount: [1:Large (67-100%)]  [N/A:N/A] Granulation Quality: [1:Red, Pink] [N/A:N/A] Necrotic Amount: [1:None Present (0%)] [N/A:N/A] Exposed Structures: [1:Fat Layer (Subcutaneous Tissue) Exposed: Yes Fascia: No Tendon: No Muscle: No Joint: No Bone: No Small (1-33%)] [N/A:N/A N/A] Treatment Notes Electronic Signature(s) Signed: 09/21/2019 5:33:39 PM By: Linton Ham MD Signed: 09/21/2019 6:00:55 PM By: Levan Hurst RN, BSN Entered By: Linton Ham on 09/21/2019 14:39:16 -------------------------------------------------------------------------------- Multi-Disciplinary Care Plan Details Patient Name: Date of Service: [name], [name] 09/21/2019 2:00 PM Medical Record [id number] Patient Account Number: [id number] Date of Birth/Sex: Treating RN: Sep 13, 1961 (58 y.o. F)  Levan Hurst Primary Care Nikolus Marczak: Delia Chimes Other Clinician: Referring Joydan Gretzinger: Treating Nael Petrosyan/Extender:Robson, Shirley Muscat, ZOE Weeks in Treatment: 136 Active Inactive Pressure Nursing Diagnoses: Knowledge deficit related to causes and risk factors for pressure ulcer development Goals: Patient will remain free from development of additional pressure ulcers Date Initiated: 10/27/2018 Target Resolution Date: 10/19/2019 Goal Status: Active Patient/caregiver will verbalize risk factors for pressure ulcer development Target Resolution Date Initiated: 02/11/2017 Date Inactivated: 10/27/2018 Date: 11/29/2018 Goal Status: Met Interventions: Assess: immobility, friction, shearing, incontinence upon admission and as needed Notes: Wound/Skin Impairment Nursing Diagnoses: Impaired tissue integrity Knowledge deficit related to ulceration/compromised skin integrity Goals: Patient/caregiver will verbalize understanding of skin care regimen Date Initiated: 11/11/2017 Target Resolution Date: 10/19/2019 Goal Status: Active Interventions: Assess patient/caregiver ability to obtain necessary supplies Assess patient/caregiver  ability to perform ulcer/skin care regimen upon admission and as needed Assess ulceration(s) every visit Treatment Activities: Patient referred to home care : 11/11/2017 Skin care regimen initiated : 11/11/2017 Topical wound management initiated : 11/11/2017 Notes: Electronic Signature(s) Signed: 09/21/2019 6:00:55 PM By: Levan Hurst RN, BSN Entered By: Levan Hurst on 09/21/2019 17:53:22 -------------------------------------------------------------------------------- Pain Assessment Details Patient Name: Date of Service: [name], [name] 09/21/2019 2:00 PM Medical Record [id number] Patient Account Number: [id number] Date of Birth/Sex: Treating RN: 05/29/1961 (58 y.o. Orvan Falconer Primary Care Kyleena Scheirer: Delia Chimes Other Clinician: Referring Leory Allinson: Treating Azora Bonzo/Extender:Robson, Shirley Muscat, ZOE Weeks in Treatment: 136 Active Problems Location of Pain Severity and Description of Pain Patient Has Paino Yes Site Locations With Dressing Change: Yes Duration of the Pain. Constant / Intermittento Constant Rate the pain. Current Pain Level: 3 Worst Pain Level: 9 Least Pain Level: 3 Tolerable Pain Level: 5 Character of Pain Describe the Pain: Aching, Burning Pain Management and Medication Current Pain Management: Medication: Yes Cold Application: No Rest: Yes Massage: No Activity: No T.E.N.S.: No Heat Application: No Leg drop or elevation: No Is the Current Pain Management Adequate: Inadequate How does your wound impact your activities of daily livingo Sleep: No Bathing: No Appetite: No Relationship With Others: No Bladder Continence: No Emotions: No Bowel Continence: No Work: No Toileting: No Drive: No Dressing: No Hobbies: No Electronic Signature(s) Signed: 11/13/2019 3:01:15 PM By: Carlene Coria RN Entered By: Carlene Coria on 09/21/2019  14:18:45 -------------------------------------------------------------------------------- Patient/Caregiver Education Details Patient Name: Date of Service: [name] 10/16/2020andnbsp2:00 PM Medical Record [id number] Patient Account Number: [id number] Date of Birth/Gender: 1961-10-13 (58 y.o. F) Treating RN: Levan Hurst Primary Care Physician: Delia Chimes Other Clinician: Referring Physician: Treating Physician/Extender:Robson, Shirley Muscat, ZOE Weeks in Treatment: 136 Education Assessment Education Provided To: Patient Education Topics Provided Wound/Skin Impairment: Methods: Explain/Verbal Responses: State content correctly Electronic Signature(s) Signed: 09/21/2019 6:00:55 PM By: Levan Hurst RN, BSN Entered By: Levan Hurst on 09/21/2019 17:53:33 -------------------------------------------------------------------------------- Wound Assessment Details Patient Name: Date of Service: [name], [name] 09/21/2019 2:00 PM Medical Record [id number] Patient Account Number: [id number] Date of Birth/Sex: Treating RN: 1961-09-23 (58 y.o. Orvan Falconer Primary Care Linea Calles: Delia Chimes Other Clinician: Referring Assata Juncaj: Treating Jonet Mathies/Extender:Robson, Shirley Muscat, ZOE Weeks in Treatment: 136 Wound Status Wound Number: 1 Primary Pressure Ulcer Etiology: Wound Location: Right Ischium Wound Open Wounding Event: Pressure Injury Status: Date Acquired: 12/06/2005 Comorbid Hypertension, Type II Diabetes, Weeks Of Treatment: 136 History: Osteoarthritis, Neuropathy Clustered Wound: No Photos Wound Measurements Length: (cm) 0.5 Width: (cm) 2.8 Depth: (cm) 0.2 Clustered Quantity: 2 Area: (cm) 1.1 Volume: (cm) 0.22 Wound Description Classification: Category/Stage III Wound Margin: Flat and Intact Exudate Amount: Medium Exudate Type: Serosanguineous Exudate Color: red, brown  Wound Bed Granulation Amount: Large (67-100%) Granulation  Quality: Red, Pink Necrotic Amount: None Present (0%) r After Cleansing: No ibrino No Exposed Structure osed: No (Subcutaneous Tissue) Exposed: Yes osed: No osed: No sed: No ed: No % Reduction in Area: 94.2% % Reduction in Volume: 96.1% Epithelialization: Small (1-33%) Tunneling: No Undermining: No Foul Odo Slough/F Fascia Exp Fat Layer Tendon Exp Muscle Exp Joint Expo Bone Expos Electronic Signature(s) Signed: 09/25/2019 1:32:19 PM By: Mikeal Hawthorne EMT/HBOT Signed: 11/13/2019 3:01:15 PM By: Carlene Coria RN Previous Signature: 09/21/2019 6:00:55 PM Version By: Levan Hurst RN, BSN Entered By: Mikeal Hawthorne on 09/25/2019 11:11:21 -------------------------------------------------------------------------------- [place] Details Patient Name: Date of Service: [name], [name] 09/21/2019 2:00 PM Medical Record [id number] Patient Account Number: [id number] Date of Birth/Sex: Treating RN: 1961/07/23 (58 y.o. Orvan Falconer Primary Care Mayetta Castleman: Delia Chimes Other Clinician: Referring Council Munguia: Treating Linkon Siverson/Extender:Robson, Shirley Muscat, ZOE Weeks in Treatment: 136 Vital Signs Time Taken: 14:17 Temperature (F): 98.4 Height (in): 67 Pulse (bpm): 70 Weight (lbs): 220 Respiratory Rate (breaths/min): 18 Body Mass Index (BMI): 34.5 Blood Pressure (mmHg): 137/61 Reference Range: 80 - 120 mg / dl Electronic Signature(s) Signed: 11/13/2019 3:01:15 PM By: Carlene Coria RN Entered By: Carlene Coria on 09/21/2019 14:18:08

## 2019-11-14 ENCOUNTER — Encounter: Payer: Self-pay | Admitting: Podiatry

## 2019-11-14 ENCOUNTER — Ambulatory Visit (INDEPENDENT_AMBULATORY_CARE_PROVIDER_SITE_OTHER): Payer: Medicare Other | Admitting: Podiatry

## 2019-11-14 ENCOUNTER — Other Ambulatory Visit: Payer: Self-pay

## 2019-11-14 DIAGNOSIS — L84 Corns and callosities: Secondary | ICD-10-CM | POA: Diagnosis not present

## 2019-11-14 DIAGNOSIS — E1142 Type 2 diabetes mellitus with diabetic polyneuropathy: Secondary | ICD-10-CM

## 2019-11-14 DIAGNOSIS — B351 Tinea unguium: Secondary | ICD-10-CM | POA: Diagnosis not present

## 2019-11-14 DIAGNOSIS — G35 Multiple sclerosis: Secondary | ICD-10-CM

## 2019-11-21 ENCOUNTER — Telehealth: Payer: Self-pay | Admitting: Family Medicine

## 2019-11-21 NOTE — Telephone Encounter (Signed)
[facility] FAX NUMBER [phone] TELE (INTAKE AT [facility]) - [phone]  PLEASE FAX ORDERS FOR HOYA LIFT AND AIR MATTRESS TO NUMBER ABOVE  THIS WAS FAXED ON 10/17/19 TO ANOTHER NUMBER AND THEY HAVE NOT RECEIVED IT.

## 2019-11-22 ENCOUNTER — Ambulatory Visit: Payer: Medicare Other | Attending: Internal Medicine

## 2019-11-22 DIAGNOSIS — Z20822 Contact with and (suspected) exposure to covid-19: Secondary | ICD-10-CM

## 2019-11-22 NOTE — Progress Notes (Signed)
Subjective: [name] is seen today for follow up. She has h/o multiple sclerosis, chronic plantar callus and painful, elongated, thickened toenails bilateral feet that she cannot cut. Pain interferes with daily activities. Aggravating factor includes wearing enclosed shoe gear and relieved with periodic debridement.  She states she will be getting a new motorized chair soon.   Medications reviewed in chart.  No Known Allergies   Objective:  Vascular Examination: Capillary refill time immediate b/l.  Dorsalis pedis present b/l.  Posterior tibial pulses present b/l.  Digital hair absent b/l.  Skin temperature gradient WNL b/l.   Dermatological Examination: Skin with normal turgor, texture and tone b/l.  Toenails 1-5 b/l discolored, thick, dystrophic with subungual debris and pain with palpation to nailbeds due to thickness of nails.  Hyperkeratotic lesion submet head 5 left foot.  No edema, no erythema, no drainage, no flocculence.  Musculoskeletal: Muscle strength flaccid LLE.  Utilizes motorized wheel chair for mobility.   No gross bony deformities b/l.  No pain, crepitus or joint limitation noted with ROM.   Neurological Examination: Protective sensation absent with 10 gram monofilament bilaterally.  Vibratory sensation intact absent b/l.  Assessment: Onychomycosis toenails 1-5 b/l  Callus submet head 5 left foot Multiple Sclerosis NIDDM with neuropathy  Plan: 1. Toenails 1-5 b/l were debrided in length and girth without iatrogenic bleeding. 2. Callus pared submetatarsal head 5 left foot utilizing sterile scalpel blade without incident. 3. She was given a written Rx for gel pad to be placed on her foot plate for cushioning. 4. Patient to continue soft, supportive shoe gear daily. 5. Patient to report any pedal injuries to medical professional immediately. 6. Follow up 3 months.  7. Patient/POA to call should there be a concern in the interim.

## 2019-11-23 ENCOUNTER — Telehealth: Payer: Self-pay | Admitting: Family Medicine

## 2019-11-23 ENCOUNTER — Encounter (HOSPITAL_BASED_OUTPATIENT_CLINIC_OR_DEPARTMENT_OTHER): Payer: Medicare Other | Admitting: Internal Medicine

## 2019-11-23 NOTE — Telephone Encounter (Signed)
[facility] is saying they have not received faxed request from Dr. Kaleen Mask ... this request has been faxed . However still haven't received . This  request was for low air mattress and a hoyer lift. So Joelene Millin with [facility] called with another way to send provider request called [employer] ; Please call [place] at [phone] (intake dept) ask to speak with a qualifier by the name of Kennis Carina . She will be able to expedite Dr. Kaleen Mask request . Please call and send through [facility]

## 2019-11-24 LAB — NOVEL CORONAVIRUS, NAA: SARS-CoV-2, NAA: NOT DETECTED

## 2019-11-26 NOTE — Telephone Encounter (Signed)
Please advise 

## 2019-11-27 ENCOUNTER — Telehealth: Payer: Self-pay | Admitting: Family Medicine

## 2019-11-27 NOTE — Telephone Encounter (Signed)
[name]  With [facility] is wanting a response or call [name] with an update on this request   FR

## 2019-11-27 NOTE — Telephone Encounter (Signed)
Pt. Also called requesting status on a fax that was reportedly sent for wheelchair parts, insists it is very important

## 2019-11-28 ENCOUNTER — Other Ambulatory Visit: Payer: Self-pay

## 2019-11-28 ENCOUNTER — Encounter (HOSPITAL_BASED_OUTPATIENT_CLINIC_OR_DEPARTMENT_OTHER): Payer: Medicare Other | Admitting: Physician Assistant

## 2019-11-28 ENCOUNTER — Telehealth (INDEPENDENT_AMBULATORY_CARE_PROVIDER_SITE_OTHER): Payer: Medicare Other | Admitting: Family Medicine

## 2019-11-28 VITALS — Ht 67.0 in | Wt 300.0 lb

## 2019-11-28 DIAGNOSIS — G35 Multiple sclerosis: Secondary | ICD-10-CM | POA: Diagnosis not present

## 2019-11-28 DIAGNOSIS — R5381 Other malaise: Secondary | ICD-10-CM | POA: Diagnosis not present

## 2019-11-28 DIAGNOSIS — E119 Type 2 diabetes mellitus without complications: Secondary | ICD-10-CM | POA: Diagnosis not present

## 2019-11-28 DIAGNOSIS — R238 Other skin changes: Secondary | ICD-10-CM | POA: Diagnosis not present

## 2019-11-28 DIAGNOSIS — K5901 Slow transit constipation: Secondary | ICD-10-CM | POA: Diagnosis not present

## 2019-11-28 DIAGNOSIS — Z7409 Other reduced mobility: Secondary | ICD-10-CM

## 2019-11-28 DIAGNOSIS — I10 Essential (primary) hypertension: Secondary | ICD-10-CM

## 2019-11-28 DIAGNOSIS — Z79891 Long term (current) use of opiate analgesic: Secondary | ICD-10-CM | POA: Diagnosis not present

## 2019-11-28 DIAGNOSIS — Z993 Dependence on wheelchair: Secondary | ICD-10-CM | POA: Diagnosis not present

## 2019-11-28 MED ORDER — MOVANTIK 25 MG PO TABS: 25.0000 mg | ORAL_TABLET | ORAL | 4 refills | Status: DC

## 2019-11-28 MED ORDER — OXYCODONE-ACETAMINOPHEN 10-325 MG PO TABS: 1.0000 | ORAL_TABLET | Freq: Three times a day (TID) | ORAL | 0 refills | Status: DC | PRN

## 2019-11-28 NOTE — Telephone Encounter (Signed)
Paperwork completed and signed by dr Nolon Rod.  Dr Bridget Hartshorn spoke with [place] Kennis Carina) and Ms. Salameh about the air mattress and hoyer lift.  Confirmation received at 3:32 pm.

## 2019-11-28 NOTE — Telephone Encounter (Signed)
Pt called in again requesting for fax orders for a hoyer lift and air mattress be sent again with .   She asked for [place] to call [facility] once the fax is sent to confirm.   She asked for [place] to call her to update her on the status of the fax and our communication with [facility].  She also advise that we speak with anyone and it does not have to be Kennis Carina.

## 2019-11-28 NOTE — Telephone Encounter (Signed)
I Called [facility] and left a msg for [name] to fax over the information she need Dr Nolon Rod to sign and we can get it signed and sent back to her asap

## 2019-11-28 NOTE — Telephone Encounter (Signed)
Spoke with [facility](operator) and advised to resend paperwork for hoyer lift and air mattress and I will have provider to sign and fax back today. Our fax number given [phone].

## 2019-11-28 NOTE — Progress Notes (Signed)
[facility] Video Encounter- SOAP NOTE Established Patient  This video encounter was conducted with the patient's (or proxy's) verbal consent via video telecommunications: yes/no: Yes Patient was instructed to have this encounter in a suitably private space; and to only have persons present to whom they give permission to participate. In addition, patient identity was confirmed by use of name plus two identifiers (DOB and address).  I discussed the limitations, risks, security and privacy concerns of performing an evaluation and management service by telephone and the availability of in person appointments. I also discussed with the patient that there may be a patient responsible charge related to this service. The patient expressed understanding and agreed to proceed.  I spent a total of TIME; 0 MIN TO 60 MIN: 25 minutes talking with the patient or their proxy.  Chief Complaint  Patient presents with  . Follow-up    patient requesting a hospital bed  . Constipation    needs movantik refill     Subjective   [name] is a 58 y.o. established patient. VIDEO visit today for  HPI Physical deconditioning Wheel chair dependent   Patient reports that her previous mattress was a low air loss mattress but it is now worn out.  Her bed and mattress are so uncomfortable that she has been sleeping on the floor.  It is winter and she is uncomfortable.  She reports that while she was having surgery and in the hospital she was in a bariatric bed. Now she is at home and she just needs a bed where she can rest. Her weight is now 300 pounds and her appetite is back.   While she is in bed she cannot turn from side to side due to her chronic pain, her physical deconditioning and her neuropathy from her Multiple Sclerosis.   She is interested in a [employer] to help her transfer from the bed to the wheel chair and also to the shower so that she can wash her wounds and her vagina to prevent further skin  breakdown.  Wound Care Her daughter performs dressing changes for her wounds and is using gau-zes, creams and pads on her bottom. She has chronic incontinence of urine.  She states that she has superficial skin breakdown but her cannot see the muscle or the the bone through her wounds.  Diabetes and Hypertension  Her diabetes is doing well on her current meds  She reports that she takes her meds as instructed. She states that her blood pressure is doing well She denies chest pains, shortness or breath or palpitations  Chronic Opiate/ Chronic constipation She was taking Movantik which helped her chronic opiate constipation She states that she would like to get a refill.  She has not been able to follow up with GI.  She is still taking opiates and needs to get a refill.    Patient Active Problem List   Diagnosis Date Noted  . Endometrial cancer ([place]) 11/21/2018  . Morbid obesity ([place]) 11/21/2018  . Pain management contract agreement 02/16/2017  . Mixed hyperlipidemia 11/24/2016  . Type 2 diabetes mellitus with diabetic polyneuropathy, without long-term current use of insulin ([place]) 11/24/2016  . Multiple sclerosis ([place]) 11/18/2016  . Leg weakness, bilateral 11/18/2016  . Dysesthesia 11/18/2016  . Diplegia of both lower extremities ([place]) 11/18/2016  . Diabetes, polyneuropathy ([place]) 11/18/2016  . Chronic indwelling Foley catheter 11/17/2016  . Essential hypertension 11/17/2016  . Slow transit constipation 11/17/2016  . Chronically on opiate therapy  11/17/2016  . Physical deconditioning 11/17/2016    Past Medical History:  Diagnosis Date  . Anemia    history of  . Arthritis   . Decubitus ulcers    currently treated at wound center  . Diabetes mellitus without complication ([place])   . Diabetic polyneuropathy ([place])   . Endometrial cancer ([facility])    Grade 2  . Fibroids   . Heartburn    Occ  . Hypertension   . Mixed hyperlipidemia   . Morbid obesity ([place])   . MS (multiple  sclerosis) ([place])     Current Outpatient Medications  Medication Sig Dispense Refill  . Adhesive Tape (PAPER TAPE 1"[id number]) TAPE Apply to dressings as needed 4 each 6  . atorvastatin (LIPITOR) 10 MG tablet Take 10 mg by mouth every evening.     . baclofen (LIORESAL) 20 MG tablet Take 1 tablet (20 mg total) by mouth 4 (four) times daily. 120 tablet 11  . Catheters (DOVER UNIVERSAL FOLEY TRAY) KIT Use foley tray kit to change catheter every 3 weeks or as needed 15 each 1  . Catheters (FOLEY CATHETER 2-WAY) MISC Use to aspirate urine suprapubic 15 each 1  . Cholecalciferol (D3-1000) 1000 units capsule Take 1,000 Units by mouth 2 (two) times daily. MORNING & AFTERNOON.    . Control Gel Formula Dressing (DUODERM CGF DRESSING) MISC Apply 1 each topically daily as needed. 5 each 11  . cyanocobalamin 500 MCG tablet Take 500 mcg by mouth daily.    . Disposable Gloves MISC Use disposable gloves to change catheter as needed 200 each 11  . furosemide (LASIX) 20 MG tablet TAKE 1 TABLET BY MOUTH ONCE DAILY 90 tablet 3  . glipiZIDE (GLUCOTROL) 5 MG tablet Take 1 tablet (5 mg total) by mouth daily before lunch. (Patient taking differently: Take 5 mg by mouth daily. ) 90 tablet 3  . hydrochlorothiazide (HYDRODIURIL) 25 MG tablet TAKE 1 TABLET BY MOUTH ONCE DAILY EVERY MORNING 90 tablet 0  . hydrocortisone (ANUSOL-HC) 2.5 % rectal cream Place 1 application rectally 2 (two) times daily. 30 g 0  . hydrocortisone (ANUSOL-HC) 25 MG suppository Place 1 suppository (25 mg total) rectally 2 (two) times daily. 12 suppository 0  . Incontinence Supply Disposable (TENA FLEX 16 PLUS) MISC Use daily as needed 30 each 11  . Incontinence Supply Disposable (TENA PROTECT UNDERWEAR PLS/XLG) MISC Use for incontinence. 30 each 11  . Interferon Beta-1b (BETASERON) 0.3 MG KIT injection Inject 0.3 mg into the skin every other day. 36 each 3  . metFORMIN (GLUCOPHAGE) 500 MG tablet Take 500 mg by mouth 2 (two) times daily.  3  .  metoCLOPramide (REGLAN) 5 MG tablet Take 1 tablet (5 mg total) by mouth 4 (four) times daily -  before meals and at bedtime. 42 tablet 0  . MOVANTIK 25 MG TABS tablet Take 25 mg by mouth every other day. IN THE MORNING.  4  . MYRBETRIQ 50 MG TB24 tablet Take 50 mg by mouth daily.    [facility] FORMULARY Power wheelchair repairs  Diagnosis code z99.3 1 each 0  . NON FORMULARY Dispense catheter urine drainage bags 90 Bag 3  . nystatin ointment (MYCOSTATIN) Apply to affected area twice daily as needed for irritation (Patient taking differently: 1 application. Apply to affected area twice daily as needed for irritation) 30 g 6  . Ostomy Supplies (NEW IMAGE SKIN/FLANGE/TAPE) MISC 1 application by Does not apply route daily as needed. 30 each 11  . oxyCODONE-acetaminophen (  PERCOCET) 10-325 MG tablet Take 1 tablet by mouth every 8 (eight) hours as needed for pain. 90 tablet 0  . potassium chloride (KLOR-CON) 10 MEQ tablet TAKE 3 TABLETS BY MOUTH TWICE DAILY 180 tablet 3  . potassium chloride (MICRO-K) 10 MEQ CR capsule Take 30 mEq by mouth 2 (two) times daily.    . promethazine (PHENERGAN) 25 MG tablet Take 1 tablet (25 mg total) by mouth every 6 (six) hours as needed for nausea or vomiting. 45 tablet 0  . senna-docusate (SENOKOT-S) 8.6-50 MG tablet Take 2 tablets by mouth at bedtime. Do not take if having loose stools 30 tablet 1  . sulfamethoxazole-trimethoprim (BACTRIM DS,SEPTRA DS) 800-160 MG tablet Take 1 tablet by mouth 2 (two) times daily. 14 tablet 0  . Syringe, Disposable, (B-D SYRINGE LUER-LOK 30CC) 30 ML MISC Use to change catheter 15 each 0  . Wound Dressings (GRX HYDROGEL GAUZE 4X4) PADS Use gauze to clean wound daily as needed 200 each 11   No current facility-administered medications for this visit.   Facility-Administered Medications Ordered in Other Visits  Medication Dose Route Frequency Provider Last Rate Last Admin  . botulinum toxin Type A (BOTOX) injection 200 Units  200 Units  Intramuscular Once Festus Aloe, MD        No Known Allergies  Social History   Socioeconomic History  . Marital status: Single    Spouse name: Not on file  . Number of children: Not on file  . Years of education: Not on file  . Highest education level: Not on file  Occupational History  . Not on file  Tobacco Use  . Smoking status: Never Smoker  . Smokeless tobacco: Never Used  Substance and Sexual Activity  . Alcohol use: No  . Drug use: No  . Sexual activity: Not on file  Other Topics Concern  . Not on file  Social History Narrative  . Not on file   Social Determinants of Health   Financial Resource Strain:   . Difficulty of Paying Living Expenses: Not on file  Food Insecurity:   . Worried About Charity fundraiser in the Last Year: Not on file  . Ran Out of Food in the Last Year: Not on file  Transportation Needs:   . Lack of Transportation (Medical): Not on file  . Lack of Transportation (Non-Medical): Not on file  Physical Activity:   . Days of Exercise per Week: Not on file  . Minutes of Exercise per Session: Not on file  Stress:   . Feeling of Stress : Not on file  Social Connections:   . Frequency of Communication with Friends and Family: Not on file  . Frequency of Social Gatherings with Friends and Family: Not on file  . Attends Religious Services: Not on file  . Active Member of Clubs or Organizations: Not on file  . Attends Archivist Meetings: Not on file  . Marital Status: Not on file  Intimate Partner Violence:   . Fear of Current or Ex-Partner: Not on file  . Emotionally Abused: Not on file  . Physically Abused: Not on file  . Sexually Abused: Not on file    ROS Review of Systems  Constitutional: Negative for activity change, appetite change, chills and fever.  HENT: Negative for congestion, nosebleeds, trouble swallowing and voice change.   Respiratory: Negative for cough, shortness of breath and wheezing.     Gastrointestinal: Negative for diarrhea, nausea and vomiting.  Genitourinary: incontinent, indwelling  foley catheter  Musculoskeletal: chronic pain.  Neurological: Negative for dizziness, speech difficulty, light-headedness and numbness.  See HPI. All other review of systems negative.   Objective   Vitals as reported by the patient: Today's Vitals   11/28/19 1348  Weight: 300 lb (136.1 kg)  Height: _0  (1.702 m)   Physical Exam  Constitutional: Oriented to person, place, and time. Appears well-developed and well-nourished.  HENT:  Head: Normocephalic and atraumatic.  Eyes: Conjunctivae and EOM are normal.  Cardiovascular: Normal rate, regular rhythm, normal heart sounds and intact distal pulses.  No murmur heard. Pulmonary/Chest: Effort normal  Neurological: Is alert and oriented to person, place, and time.  Skin: Skin is warm. Capillary refill takes less than 2 seconds.  Psychiatric: Has a normal mood and affect. Behavior is normal. Judgment and thought content normal.   Morrigan was seen today for follow-up and constipation.  Diagnoses and all orders for this visit:  Chronically on opiate therapy  Multiple sclerosis ([place])  Does not transfer location  Morbid obesity ([place])  Physical deconditioning  Skin breakdown  Slow transit constipation  Wheelchair bound    - Spoke with [place] regarding patient's orders.  She still has an active order for the semi-electric bed with foam mattress.  Discussed with Ms. Skilton that she does not meet CMS criteria for a bariatric bed.  She is agreeable to a semi-electric bed. -  Order placed for gel overlay  - discussed that due to her limited mobility and her physical deconditioning producing inability to turn in bed or transfer will order a [employer] which comes with a mesh sling.  This is to facilitate transferring and give her opportunity to improve her hygiene and improve circulation which will decrease her  risk of further decubitus ulcers  -   Will send [place] to assist patient with setting up in the bed and to document any pressure ulcers.  Discussed that her weight gain from 233 pounds while in the hospital to 300 pounds is a very dramatic weight gain.  She has diabetes and hypertension so discussed calorie restriction  She is due for a refill of oxycodone but does not have movantic. Will refill both today.    I discussed the assessment and treatment plan with the patient. The patient was provided an opportunity to ask questions and all were answered. The patient agreed with the plan and demonstrated an understanding of the instructions.   The patient was advised to call back or seek an in-person evaluation if the symptoms worsen or if the condition fails to improve as anticipated.  I provided 25 minutes of face-to-face time during this encounter.  Forrest Moron, MD  Primary Care at [facility]

## 2019-12-05 ENCOUNTER — Other Ambulatory Visit: Payer: Self-pay

## 2019-12-05 ENCOUNTER — Encounter (HOSPITAL_BASED_OUTPATIENT_CLINIC_OR_DEPARTMENT_OTHER): Payer: Medicare Other | Attending: Physician Assistant | Admitting: Physician Assistant

## 2019-12-05 DIAGNOSIS — Z993 Dependence on wheelchair: Secondary | ICD-10-CM | POA: Diagnosis not present

## 2019-12-05 DIAGNOSIS — L89313 Pressure ulcer of right buttock, stage 3: Secondary | ICD-10-CM | POA: Insufficient documentation

## 2019-12-05 DIAGNOSIS — G35 Multiple sclerosis: Secondary | ICD-10-CM | POA: Insufficient documentation
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
## 2019-12-05 NOTE — Progress Notes (Addendum)
[name], [name] ([id number]) Visit Report for 12/05/2019 Arrival Information Details Patient Name: Date of Service: [name], [name] 12/05/2019 2:15 PM Medical Record [id number] Patient Account Number: [id number] Date of Birth/Sex: Treating RN: Oct 05, 1961 (58 y.o. Helene Shoe, Tammi Klippel Primary Care Darcey Cardy: Delia Chimes Other Clinician: Referring Whitleigh Garramone: Treating Raneshia Derick/Extender:Madduri, Windy Fast, ZOE Weeks in Treatment: 146 Visit Information History Since Last Visit Added or deleted any medications: No Patient Arrived: Wheel Chair Any new allergies or adverse reactions: No Arrival Time: 14:30 Had a fall or experienced change in No activities of daily living that may affect Accompanied By: self risk of falls: Transfer Assistance: Civil Service fast streamer Signs or symptoms of abuse/neglect since last No Patient Identification Verified: Yes visito Secondary Verification Process Yes Hospitalized since last visit: No Completed: Implantable device outside of the clinic excluding No Patient Requires Transmission-Based No cellular tissue based products placed in the center Precautions: since last visit: Patient Has Alerts: No Has Dressing in Place as Prescribed: Yes Pain Present Now: No Notes per patient air mattress broke. Per patient PCP working on ordering patient a new one. Per patient using currently a regular mattress. Electronic Signature(s) Signed: 12/05/2019 5:32:50 PM By: Deon Pilling Entered By: Deon Pilling on 12/05/2019 14:47:52 -------------------------------------------------------------------------------- Clinic Level of Care Assessment Details Patient Name: Date of Service: [name], [name] 12/05/2019 2:15 PM Medical Record [id number] Patient Account Number: [id number] Date of Birth/Sex: Treating RN: [birth year] (58 y.o. Elam Dutch Primary Care Travonne Schowalter: Delia Chimes Other Clinician: Referring Christyna Letendre: Treating Asheton Viramontes/Extender:Madduri,  Windy Fast, ZOE Weeks in Treatment: 146 Clinic Level of Care Assessment Items TOOL 4 Quantity Score '[]'$  - Use when only an EandM is performed on FOLLOW-UP visit 0 ASSESSMENTS - Nursing Assessment / Reassessment X - Reassessment of Co-morbidities (includes updates in patient status) 1 10 X - Reassessment of Adherence to Treatment Plan 1 5 ASSESSMENTS - Wound and Skin Assessment / Reassessment X - Simple Wound Assessment / Reassessment - one wound 1 5 '[]'$  - Complex Wound Assessment / Reassessment - multiple wounds 0 '[]'$  - Dermatologic / Skin Assessment (not related to wound area) 0 ASSESSMENTS - Focused Assessment '[]'$  - Circumferential Edema Measurements - multi extremities 0 '[]'$  - Nutritional Assessment / Counseling / Intervention 0 X - Lower Extremity Assessment (monofilament, tuning fork, pulses) 1 5 '[]'$  - Peripheral Arterial Disease Assessment (using hand held doppler) 0 ASSESSMENTS - Ostomy and/or Continence Assessment and Care '[]'$  - Incontinence Assessment and Management 0 '[]'$  - Ostomy Care Assessment and Management (repouching, etc.) 0 PROCESS - Coordination of Care X - Simple Patient / Family Education for ongoing care 1 15 '[]'$  - Complex (extensive) Patient / Family Education for ongoing care 0 X - Staff obtains Programmer, systems, Records, Test Results / Process Orders 1 10 '[]'$  - Staff telephones HHA, Nursing Homes / Clarify orders / etc 0 '[]'$  - Routine Transfer to another Facility (non-emergent condition) 0 '[]'$  - Routine Hospital Admission (non-emergent condition) 0 '[]'$  - New Admissions / Biomedical engineer / Ordering NPWT, Apligraf, etc. 0 '[]'$  - Emergency Hospital Admission (emergent condition) 0 X - Simple Discharge Coordination 1 10 '[]'$  - Complex (extensive) Discharge Coordination 0 PROCESS - Special Needs '[]'$  - Pediatric / Minor Patient Management 0 '[]'$  - Isolation Patient Management 0 '[]'$  - Hearing / Language / Visual special needs 0 '[]'$  - Assessment of Community assistance  (transportation, D/C planning, etc.) 0 '[]'$  - Additional assistance / Altered mentation 0 '[]'$  - Support Surface(s) Assessment (bed, cushion, seat, etc.) 0 INTERVENTIONS - Wound Cleansing / Measurement X - Simple  Wound Cleansing - one wound 1 5 '[]'$  - Complex Wound Cleansing - multiple wounds 0 X - Wound Imaging (photographs - any number of wounds) 1 5 '[]'$  - Wound Tracing (instead of photographs) 0 X - Simple Wound Measurement - one wound 1 5 '[]'$  - Complex Wound Measurement - multiple wounds 0 INTERVENTIONS - Wound Dressings X - Small Wound Dressing one or multiple wounds 1 10 '[]'$  - Medium Wound Dressing one or multiple wounds 0 '[]'$  - Large Wound Dressing one or multiple wounds 0 X - Application of Medications - topical 1 5 '[]'$  - Application of Medications - injection 0 INTERVENTIONS - Miscellaneous '[]'$  - External ear exam 0 '[]'$  - Specimen Collection (cultures, biopsies, blood, body fluids, etc.) 0 '[]'$  - Specimen(s) / Culture(s) sent or taken to Lab for analysis 0 X - Patient Transfer (multiple staff / Civil Service fast streamer / Similar devices) 1 10 '[]'$  - Simple Staple / Suture removal (25 or less) 0 '[]'$  - Complex Staple / Suture removal (26 or more) 0 '[]'$  - Hypo / Hyperglycemic Management (close monitor of Blood Glucose) 0 '[]'$  - Ankle / Brachial Index (ABI) - do not check if billed separately 0 X - Vital Signs 1 5 Has the patient been seen at the hospital within the last three years: Yes Total Score: 105 Level Of Care: New/Established - Level 3 Electronic Signature(s) Signed: 12/05/2019 5:40:33 PM By: Baruch Gouty RN, BSN Entered By: Baruch Gouty on 12/05/2019 15:09:56 -------------------------------------------------------------------------------- Encounter Discharge Information Details Patient Name: Date of Service: [name], [name] 12/05/2019 2:15 PM Medical Record [id number] Patient Account Number: [id number] Date of Birth/Sex: Treating RN: 29-May-1961 (58 y.o. Orvan Falconer Primary Care  Nykole Matos: Delia Chimes Other Clinician: Referring Anyi Fels: Treating Teauna Dubach/Extender:Madduri, Windy Fast, ZOE Weeks in Treatment: 146 Encounter Discharge Information Items Discharge Condition: Stable Ambulatory Status: Wheelchair Discharge Destination: Home Transportation: Private Auto Accompanied By: self Schedule Follow-up Appointment: Yes Clinical Summary of Care: Patient Declined Electronic Signature(s) Signed: 12/05/2019 5:40:54 PM By: Carlene Coria RN Entered By: Carlene Coria on 12/05/2019 15:39:00 -------------------------------------------------------------------------------- Lower Extremity Assessment Details Patient Name: Date of Service: [name], [name] 12/05/2019 2:15 PM Medical Record [id number] Patient Account Number: [id number] Date of Birth/Sex: Treating RN: [birth year] (58 y.o. Debby Bud Primary Care Bowe Sidor: Delia Chimes Other Clinician: Referring Shenita Trego: Treating Charlie Seda/Extender:Madduri, Windy Fast, ZOE Weeks in Treatment: 146 Electronic Signature(s) Signed: 12/05/2019 5:32:50 PM By: Deon Pilling Entered By: Deon Pilling on 12/05/2019 14:47:08 -------------------------------------------------------------------------------- [place] Details Patient Name: Date of Service: [name], [name] 12/05/2019 2:15 PM Medical Record [id number] Patient Account Number: [id number] Date of Birth/Sex: Treating RN: 1961/10/31 (58 y.o. Elam Dutch Primary Care Idonia Zollinger: Delia Chimes Other Clinician: Referring Kirstyn Lean: Treating Theora Vankirk/Extender:Madduri, Windy Fast, ZOE Weeks in Treatment: 146 Active Inactive Pressure Nursing Diagnoses: Knowledge deficit related to causes and risk factors for pressure ulcer development Goals: Patient will remain free from development of additional pressure ulcers Target Resolution Date Initiated: 10/27/2018 Date Inactivated: 12/05/2019 Date: 11/23/2019 Goal Status:  Met Patient/caregiver will verbalize risk factors for pressure ulcer development Target Resolution Date Initiated: 02/11/2017 Date Inactivated: 10/27/2018 Date: 11/29/2018 Goal Status: Met Patient/caregiver will verbalize understanding of pressure ulcer management Date Initiated: 12/05/2019 Target Resolution Date: 01/02/2020 Goal Status: Active Interventions: Assess: immobility, friction, shearing, incontinence upon admission and as needed Notes: Wound/Skin Impairment Nursing Diagnoses: Impaired tissue integrity Knowledge deficit related to ulceration/compromised skin integrity Goals: Patient/caregiver will verbalize understanding of skin care regimen Date Initiated: 11/11/2017 Target Resolution Date: 01/02/2020 Goal Status: Active Interventions: Assess patient/caregiver ability to obtain  necessary supplies Assess patient/caregiver ability to perform ulcer/skin care regimen upon admission and as needed Assess ulceration(s) every visit Treatment Activities: Patient referred to home care : 11/11/2017 Skin care regimen initiated : 11/11/2017 Topical wound management initiated : 11/11/2017 Notes: Electronic Signature(s) Signed: 12/05/2019 5:40:33 PM By: Baruch Gouty RN, BSN Entered By: Baruch Gouty on 12/05/2019 15:08:30 -------------------------------------------------------------------------------- Pain Assessment Details Patient Name: Date of Service: [name], [name] 12/05/2019 2:15 PM Medical Record [id number] Patient Account Number: [id number] Date of Birth/Sex: Treating RN: [birth year] (58 y.o. Debby Bud Primary Care Vaden Becherer: Delia Chimes Other Clinician: Referring Shereese Bonnie: Treating Attilio Zeitler/Extender:Madduri, Windy Fast, ZOE Weeks in Treatment: 146 Active Problems Location of Pain Severity and Description of Pain Patient Has Paino No Site Locations Rate the pain. Current Pain Level: 0 Pain Management and Medication Current Pain  Management: Electronic Signature(s) Signed: 12/05/2019 5:32:50 PM By: Deon Pilling Entered By: Deon Pilling on 12/05/2019 14:47:03 -------------------------------------------------------------------------------- Patient/Caregiver Education Details Patient Name: Date of Service: [name] 12/30/2020andnbsp2:15 PM Medical Record [id number] Patient Account Number: [id number] Date of Birth/Gender: [birth year] (58 y.o. F) Treating RN: Baruch Gouty Primary Care Physician: Delia Chimes Other Clinician: Referring Physician: Treating Physician/Extender:Madduri, Windy Fast, ZOE Weeks in Treatment: 146 Education Assessment Education Provided To: Patient Education Topics Provided Pressure: Methods: Explain/Verbal Responses: Reinforcements needed, State content correctly Wound/Skin Impairment: Methods: Explain/Verbal Responses: Reinforcements needed, State content correctly Electronic Signature(s) Signed: 12/05/2019 5:40:33 PM By: Baruch Gouty RN, BSN Entered By: Baruch Gouty on 12/05/2019 15:08:56 -------------------------------------------------------------------------------- Wound Assessment Details Patient Name: Date of Service: [name], [name] 12/05/2019 2:15 PM Medical Record [id number] Patient Account Number: [id number] Date of Birth/Sex: Treating RN: 04/14/1961 (58 y.o. Helene Shoe, Tammi Klippel Primary Care Kilea Mccarey: Delia Chimes Other Clinician: Referring Antwon Rochin: Treating Daivik Overley/Extender:Madduri, Windy Fast, ZOE Weeks in Treatment: 146 Wound Status Wound Number: 1 Primary Pressure Ulcer Etiology: Wound Location: Right Ischium Wound Open Wounding Event: Pressure Injury Status: Date Acquired: 12/06/2005 Comorbid Hypertension, Type II Diabetes, Weeks Of Treatment: 146 History: Osteoarthritis, Neuropathy Clustered Wound: Yes Photos Wound Measurements Length: (cm) 6.5 Width: (cm) 5 Depth: (cm) 0.3 Clustered Quantity: 5 Area: (cm)  25.525 Volume: (cm) 7.658 Wound Description Classification: Category/Stage III Wound Margin: Flat and Intact Exudate Amount: Medium Exudate Type: Serosanguineous Exudate Color: red, brown Wound Bed Granulation Amount: Large (67-100%) Granulation Quality: Red Necrotic Amount: None Present (0%) Foul Odor After Cleansing: N Slough/Fibrino N Exposed Structure Fascia Exposed: N Fat Layer (Subcutaneous Tissue) Exposed: Y Tendon Exposed: N Muscle Exposed: N Joint Exposed: N Bone Exposed: N % Reduction in Area: -35.4% % Reduction in Volume: -35.4% Epithelialization: None Tunneling: No Undermining: No o o o es o o o o Treatment Notes Wound #1 (Right Ischium) 1. Cleanse With Wound Cleanser 3. Primary Dressing Applied Polymem 4. Secondary Dressing ABD Pad 5. Secured With Tape Notes polymem foam Electronic Signature(s) Signed: 12/10/2019 4:12:00 PM By: Mikeal Hawthorne EMT/HBOT Signed: 12/10/2019 4:44:32 PM By: Deon Pilling Previous Signature: 12/05/2019 5:32:50 PM Version By: Deon Pilling Entered By: Mikeal Hawthorne on 12/10/2019 10:41:43 -------------------------------------------------------------------------------- Vitals Details Patient Name: Date of Service: [name], [name] 12/05/2019 2:15 PM Medical Record [id number] Patient Account Number: [id number] Date of Birth/Sex: Treating RN: 1961-02-24 (58 y.o. Debby Bud Primary Care Vaughn Beaumier: Delia Chimes Other Clinician: Referring Mahlet Jergens: Treating Khushi Zupko/Extender:Madduri, Windy Fast, ZOE Weeks in Treatment: 146 Vital Signs Time Taken: 14:35 Temperature (F): 98.1 Height (in): 67 Pulse (bpm): 79 Weight (lbs): 220 Respiratory Rate (breaths/min): 20 Body Mass Index (BMI): 34.5 Blood Pressure (mmHg): 149/65 Reference Range: 80 - 120 mg / dl Electronic Signature(s) Signed:  12/05/2019 5:32:50 PM By: Deon Pilling Entered By: Deon Pilling on 12/05/2019 14:46:56

## 2019-12-10 ENCOUNTER — Ambulatory Visit: Payer: Self-pay | Admitting: Radiation Oncology

## 2019-12-10 DIAGNOSIS — M1990 Unspecified osteoarthritis, unspecified site: Secondary | ICD-10-CM | POA: Diagnosis not present

## 2019-12-10 DIAGNOSIS — D649 Anemia, unspecified: Secondary | ICD-10-CM | POA: Diagnosis not present

## 2019-12-10 DIAGNOSIS — Z6841 Body Mass Index (BMI) 40.0 and over, adult: Secondary | ICD-10-CM | POA: Diagnosis not present

## 2019-12-10 DIAGNOSIS — I10 Essential (primary) hypertension: Secondary | ICD-10-CM | POA: Diagnosis not present

## 2019-12-10 DIAGNOSIS — Z8542 Personal history of malignant neoplasm of other parts of uterus: Secondary | ICD-10-CM | POA: Diagnosis not present

## 2019-12-10 DIAGNOSIS — E1142 Type 2 diabetes mellitus with diabetic polyneuropathy: Secondary | ICD-10-CM | POA: Diagnosis not present

## 2019-12-10 DIAGNOSIS — G35 Multiple sclerosis: Secondary | ICD-10-CM | POA: Diagnosis not present

## 2019-12-10 DIAGNOSIS — Z7984 Long term (current) use of oral hypoglycemic drugs: Secondary | ICD-10-CM | POA: Diagnosis not present

## 2019-12-10 DIAGNOSIS — L89324 Pressure ulcer of left buttock, stage 4: Secondary | ICD-10-CM | POA: Diagnosis not present

## 2019-12-10 DIAGNOSIS — K5901 Slow transit constipation: Secondary | ICD-10-CM | POA: Diagnosis not present

## 2019-12-10 DIAGNOSIS — Z9181 History of falling: Secondary | ICD-10-CM | POA: Diagnosis not present

## 2019-12-10 DIAGNOSIS — R32 Unspecified urinary incontinence: Secondary | ICD-10-CM | POA: Diagnosis not present

## 2019-12-10 DIAGNOSIS — E785 Hyperlipidemia, unspecified: Secondary | ICD-10-CM | POA: Diagnosis not present

## 2019-12-10 DIAGNOSIS — Z993 Dependence on wheelchair: Secondary | ICD-10-CM | POA: Diagnosis not present

## 2019-12-12 ENCOUNTER — Ambulatory Visit: Payer: Self-pay

## 2019-12-18 ENCOUNTER — Telehealth: Payer: Self-pay | Admitting: *Deleted

## 2019-12-18 NOTE — Telephone Encounter (Signed)
CALLED PATIENT TO INFORM THAT FU APPT. FOR 12-20-19 HAS BEEN CHANGED TO 1:30 PM, SPOKE WITH PATIENT AND SHE IS AWARE OF THIS APPT. CHANGE

## 2019-12-18 NOTE — Telephone Encounter (Signed)
RETURNED PATIENT'S PHONE CALL, SPOKE WITH PATIENT. ?

## 2019-12-19 ENCOUNTER — Other Ambulatory Visit: Payer: Self-pay

## 2019-12-19 ENCOUNTER — Ambulatory Visit (INDEPENDENT_AMBULATORY_CARE_PROVIDER_SITE_OTHER): Payer: Medicare Other | Admitting: Neurology

## 2019-12-19 ENCOUNTER — Encounter: Payer: Self-pay | Admitting: Neurology

## 2019-12-19 VITALS — BP 132/71 | HR 63 | Temp 97.4°F | Ht 67.0 in

## 2019-12-19 DIAGNOSIS — E559 Vitamin D deficiency, unspecified: Secondary | ICD-10-CM

## 2019-12-19 DIAGNOSIS — G35 Multiple sclerosis: Secondary | ICD-10-CM

## 2019-12-19 DIAGNOSIS — R208 Other disturbances of skin sensation: Secondary | ICD-10-CM

## 2019-12-19 DIAGNOSIS — E1142 Type 2 diabetes mellitus with diabetic polyneuropathy: Secondary | ICD-10-CM

## 2019-12-19 DIAGNOSIS — G8220 Paraplegia, unspecified: Secondary | ICD-10-CM

## 2019-12-19 DIAGNOSIS — R29898 Other symptoms and signs involving the musculoskeletal system: Secondary | ICD-10-CM

## 2019-12-19 DIAGNOSIS — C541 Malignant neoplasm of endometrium: Secondary | ICD-10-CM

## 2019-12-19 MED ORDER — BETASERON 0.3 MG ~~LOC~~ KIT: 0.3000 mg | PACK | SUBCUTANEOUS | 3 refills | Status: DC

## 2019-12-19 MED ORDER — BACLOFEN 20 MG PO TABS: ORAL_TABLET | ORAL | 11 refills | Status: DC

## 2019-12-19 NOTE — Progress Notes (Signed)
[facility]  PATIENT: [name] DOB: 21-Aug-1961  REFERRING DOCTOR OR PCP:  Delia Chimes SOURCE: patient, records from Dr. Nolon Rod  _________________________________   HISTORICAL  CHIEF COMPLAINT:  Chief Complaint  Patient presents with  . Follow-up    RM 12, alone. Last seen 12/13/2018.   . Multiple Sclerosis    On Betaseron. Pt in electric WC. Weakness on right side intermittently. Episodes usually last 10 min or so and then resolve.     HISTORY OF PRESENT ILLNESS:  [name] is a 59 y.o. woman with relapsing / active SPMS diagnosed in 2006.  Update 12/19/2019: She feels she is doing about the same as last year.     She is on Betaseron.   She feels strength is the same.   She needs a [employer] fast streamer to transfer.    Her daughters live with her and help her.    She has a little more muscle spasticity in her legs.    Legs do not completely lock up but become stiff.   She does not have pain related to this.   She denies numbness in her hands.     She has a suprapubic catheter and has no recent infections.    She feels vision is the same with sequela of left optic neuritis.     She notes some fatigue that is stable.  She has trouble getting comfortable at night.   Her head and feet are elevated at night in bed.    She denies depression or anxiety.    She denies cognitive issues.    She had a complete hysterectomy due to endometrial cancer and has done well.   She did well during recovery.    Update 12/13/2018: She feels her MS is stable and she denies exacerbation.     She is now using the wheelchair exclusively.    She uses a bedpan.     She has help (daughter).    She has a lot of weakness and spasticity in her legs.    She is on baclofen with some benefit.   She has some tingling and numbness on the right side and the hand often feels funny.    She has a suprapubic catheter for urinary retention.   No infections recently.    She note reduced vision OS but colors  are about the same left and right.   She is going to see ophthalmology after her surgery.      Mood is doing ok.    She is sleeping well most nights.   Cognition is doing well.    Fatigue is mild.       She was just diagnosed with uterine cancer (discovered during pap smear) and has surgery scheduled.    She was diagnosed in 2006 (Dr. Luberta Robertson in [facility]) and was on Copaxone (several exacerbations) and then Betaseron.      Uptake 11/23/2017:    She feels her MS has been stable. She has been on Betaseron for many years. She had exacerbations on Copaxone. She is wheelchair-bound and has been for several years. She can transfer with assistance independently but has noted more difficulty doing that over the past few months. She does have spasticity in both legs, right equals left. Higher dose of baclofen did not help the spasticity any.  She notes visual blurring out of the left eye. Right eye visual acuity is fine. She has a suprapubic catheter for bladder dysfunction. She has had  that since her large exacerbation in 2008. She notes some fatigue but has not been getting a full night's rest every night. She probably sleeps 4-5 hours most nights. She denies any depression or cognitive problems.   From 05/18/2017: MS:   She is on Betaseron. She had exacerbations while on Copaxone. She tolerates the Betaseron well and is not interested in switching to a different medication was more than 10 years ago. She is claustrophobic and does not wish to have another MRI if possible.  Gait/strength/sensation: She has been wheelchair bound for several years and was using a walker since 2007 shortly after her diagnosis. She helps him with transfers cannot transfer independently. Her arms remained strong but she has weakness in both legs with spasticity. Muscle spasticity is worse at night but can occur during the day as well.   She takes baclofen 20 mg 3 times a day. She denies any numbness or tingling in the arms. She  has a mild burning sensation in her feet. Lamotrigine did not help the dysesthetic sensation.   Bladder/bowel: She has a suprapubic catheter since 2009 due to urinary retention. She denies any bowel dysfunction.  She gets frequent UTI's     She recently started taking cranberries and drinks lots of fluids to try to prevent them.    Vision:  She notes some difficulty with her left eye and has been told by her ophthalmologist that it is due to a combination of MS and diabetes.    Fatigue/sleep: She denies any significant problems with fatigue. She sleeps well at night. She gets some spasms when she lays down but once she falls asleep she stays asleep.   Mood/cognition:  She feels her mood is good. She denies any depression or anxiety. She denies any difficulty with her memory or other cognitive skills.   MS History:   In 2006, she presented with tingling in her hands and right greater than left leg weakness.    She saw Dr. Luberta Robertson in [facility]. She was placed on Copaxone. Unfortunately,  she had a major exacerbation later that year and lost most of the use of her legs.  She was then placed on Betaseron and remains on Betaseron. She does not think she has had any exacerbations while she is on it. She tolerates it well and does not have any skin reactions.    Since 2007, she has been predominantly wheelchair bound strength has progressively worsened and she went from using a walker for short distances to being completely wheelchair bound a few years ago.   Her last MRI was greater than 10 years ago.  REVIEW OF SYSTEMS: Constitutional: No fevers, chills, sweats, or change in appetite Eyes: Reduced left vision.  No eye pain Ear, nose and throat: No hearing loss, ear pain, nasal congestion, sore throat Cardiovascular: No chest pain, palpitations Respiratory: No shortness of breath at rest or with exertion.   No wheezes GastrointestinaI: No nausea, vomiting, diarrhea, abdominal pain, fecal  incontinence Genitourinary: She has a suprapubic catheter.. Musculoskeletal: No neck pain, back pain Integumentary: No rash, pruritus, skin lesions Neurological: as above Psychiatric: Denies depression at this time.  No anxiety Endocrine: has NIDDM Hematologic/Lymphatic: No anemia, purpura, petechiae. Allergic/Immunologic: No itchy/runny eyes, nasal congestion, recent allergic reactions, rashes  ALLERGIES: No Known Allergies  HOME MEDICATIONS:  Current Outpatient Medications:  .  atorvastatin (LIPITOR) 10 MG tablet, Take 10 mg by mouth every evening. , Disp: , Rfl:  .  baclofen (LIORESAL) 20 MG tablet,  Take 1 tablet (20 mg total) by mouth 4 (four) times daily., Disp: 120 tablet, Rfl: 11 .  Catheters (DOVER UNIVERSAL FOLEY TRAY) KIT, Use foley tray kit to change catheter every 3 weeks or as needed, Disp: 15 each, Rfl: 1 .  Catheters (FOLEY CATHETER 2-WAY) MISC, Use to aspirate urine suprapubic, Disp: 15 each, Rfl: 1 .  Cholecalciferol (D3-1000) 1000 units capsule, Take 1,000 Units by mouth 2 (two) times daily. MORNING & AFTERNOON., Disp: , Rfl:  .  cyanocobalamin 500 MCG tablet, Take 500 mcg by mouth daily., Disp: , Rfl:  .  furosemide (LASIX) 20 MG tablet, TAKE 1 TABLET BY MOUTH ONCE DAILY, Disp: 90 tablet, Rfl: 3 .  glipiZIDE (GLUCOTROL) 5 MG tablet, Take 1 tablet (5 mg total) by mouth daily before lunch. (Patient taking differently: Take 5 mg by mouth daily. ), Disp: 90 tablet, Rfl: 3 .  hydrochlorothiazide (HYDRODIURIL) 25 MG tablet, TAKE 1 TABLET BY MOUTH ONCE DAILY EVERY MORNING, Disp: 90 tablet, Rfl: 0 .  hydrocortisone (ANUSOL-HC) 2.5 % rectal cream, Place 1 application rectally 2 (two) times daily., Disp: 30 g, Rfl: 0 .  hydrocortisone (ANUSOL-HC) 25 MG suppository, Place 1 suppository (25 mg total) rectally 2 (two) times daily., Disp: 12 suppository, Rfl: 0 .  Incontinence Supply Disposable (TENA FLEX 16 PLUS) MISC, Use daily as needed, Disp: 30 each, Rfl: 11 .  Incontinence  Supply Disposable (TENA PROTECT UNDERWEAR PLS/XLG) MISC, Use for incontinence., Disp: 30 each, Rfl: 11 .  Interferon Beta-1b (BETASERON) 0.3 MG KIT injection, Inject 0.3 mg into the skin every other day., Disp: 36 each, Rfl: 3 .  metFORMIN (GLUCOPHAGE) 500 MG tablet, Take 500 mg by mouth 2 (two) times daily., Disp: , Rfl: 3 .  MOVANTIK 25 MG TABS tablet, Take 1 tablet (25 mg total) by mouth every other day. IN THE MORNING., Disp: 30 tablet, Rfl: 4 .  MYRBETRIQ 50 MG TB24 tablet, Take 50 mg by mouth daily., Disp: , Rfl:  .  NON FORMULARY, Power wheelchair repairs  Diagnosis code z99.3, Disp: 1 each, Rfl: 0 .  NON FORMULARY, Dispense catheter urine drainage bags, Disp: 90 Bag, Rfl: 3 .  nystatin ointment (MYCOSTATIN), Apply to affected area twice daily as needed for irritation (Patient taking differently: 1 application. Apply to affected area twice daily as needed for irritation), Disp: 30 g, Rfl: 6 .  Ostomy Supplies (NEW IMAGE SKIN/FLANGE/TAPE) MISC, 1 application by Does not apply route daily as needed., Disp: 30 each, Rfl: 11 .  oxyCODONE-acetaminophen (PERCOCET) 10-325 MG tablet, Take 1 tablet by mouth every 8 (eight) hours as needed for pain., Disp: 90 tablet, Rfl: 0 .  potassium chloride (KLOR-CON) 10 MEQ tablet, TAKE 3 TABLETS BY MOUTH TWICE DAILY, Disp: 180 tablet, Rfl: 3 .  promethazine (PHENERGAN) 25 MG tablet, Take 1 tablet (25 mg total) by mouth every 6 (six) hours as needed for nausea or vomiting., Disp: 45 tablet, Rfl: 0 .  senna-docusate (SENOKOT-S) 8.6-50 MG tablet, Take 2 tablets by mouth at bedtime. Do not take if having loose stools, Disp: 30 tablet, Rfl: 1 .  Syringe, Disposable, (B-D SYRINGE LUER-LOK 30CC) 30 ML MISC, Use to change catheter, Disp: 15 each, Rfl: 0 No current facility-administered medications for this visit.  Facility-Administered Medications Ordered in Other Visits:  .  botulinum toxin Type A (BOTOX) injection 200 Units, 200 Units, Intramuscular, Once, Festus Aloe, MD  PAST MEDICAL HISTORY: Past Medical History:  Diagnosis Date  . Anemia    history of  .  Arthritis   . Decubitus ulcers    currently treated at wound center  . Diabetes mellitus without complication ([place])   . Diabetic polyneuropathy ([place])   . Endometrial cancer (HCC)    Grade 2  . Fibroids   . Heartburn    Occ  . Hypertension   . Mixed hyperlipidemia   . Morbid obesity ([place])   . MS (multiple sclerosis) ([place])     PAST SURGICAL HISTORY: Past Surgical History:  Procedure Laterality Date  . COLONOSCOPY    . ESOPHAGOGASTRODUODENOSCOPY N/A 12/02/2017   Procedure: ESOPHAGOGASTRODUODENOSCOPY (EGD);  Surgeon: Ronnette Juniper, MD;  Location: [facility];  Service: Gastroenterology;  Laterality: N/A;  . ROBOTIC ASSISTED TOTAL HYSTERECTOMY WITH BILATERAL SALPINGO OOPHERECTOMY Bilateral 12/19/2018   Procedure: XI ROBOTIC ASSISTED TOTAL HYSTERECTOMY (UTERUS GREATER THAN 250gr) WITH BILATERAL SALPINGO OOPHORECTOMY;  Surgeon: Everitt Amber, MD;  Location: [facility];  Service: Gynecology;  Laterality: Bilateral;  . SENTINEL NODE BIOPSY N/A 12/19/2018   Procedure: SENTINEL NODE BIOPSY;  Surgeon: Everitt Amber, MD;  Location: [facility];  Service: Gynecology;  Laterality: N/A;  . SUPRAPUBIC CATHETER INSERTION  2009  . TONSILLECTOMY    . TUBAL LIGATION      FAMILY HISTORY: Family History  Problem Relation Age of Onset  . Cancer Mother   . Hypertension Mother   . Heart disease Father   . Diabetes Father   . Hypertension Sister   . Hypertension Brother   . Breast cancer Neg Hx     SOCIAL HISTORY:  Social History   Socioeconomic History  . Marital status: Single    Spouse name: Not on file  . Number of children: Not on file  . Years of education: Not on file  . Highest education level: Not on file  Occupational History  . Not on file  Tobacco Use  . Smoking status: Never Smoker  . Smokeless tobacco: Never Used  Substance and Sexual Activity  . Alcohol use: No  . Drug use: No   . Sexual activity: Not on file  Other Topics Concern  . Not on file  Social History Narrative  . Not on file   Social Determinants of Health   Financial Resource Strain:   . Difficulty of Paying Living Expenses: Not on file  Food Insecurity:   . Worried About Charity fundraiser in the Last Year: Not on file  . Ran Out of Food in the Last Year: Not on file  Transportation Needs:   . Lack of Transportation (Medical): Not on file  . Lack of Transportation (Non-Medical): Not on file  Physical Activity:   . Days of Exercise per Week: Not on file  . Minutes of Exercise per Session: Not on file  Stress:   . Feeling of Stress : Not on file  Social Connections:   . Frequency of Communication with Friends and Family: Not on file  . Frequency of Social Gatherings with Friends and Family: Not on file  . Attends Religious Services: Not on file  . Active Member of Clubs or Organizations: Not on file  . Attends Archivist Meetings: Not on file  . Marital Status: Not on file  Intimate Partner Violence:   . Fear of Current or Ex-Partner: Not on file  . Emotionally Abused: Not on file  . Physically Abused: Not on file  . Sexually Abused: Not on file     PHYSICAL EXAM  Vitals:   12/19/19 1420  BP: 132/71  Pulse: 63  Temp: Marland Kitchen)  97.4 F (36.3 C)  SpO2: 97%  Height: _0  (1.702 m)    Body mass index is 46.99 kg/m.   General: The patient is well-developed and well-nourished and in no acute distress in a wheelchair  Neck:    The neck is nontender with a good range of motion.   Skin/Ext.: Arms/legs without rash.   She has mild pedal edema.  Neurologic Exam  Mental status: The patient is alert and oriented x 3 at the time of the examination. The patient has apparent normal recent and remote memory, with an apparently normal attention span and concentration ability.   Speech is normal.  Cranial nerves: Extraocular movements are full.  Color vision was symmetric but  acuity was reduced in the left eye.  Facial strength was normal.  Facial sensation was normal.  Trapezius strength was normal.  Hearing was normal and symmetric.  Motor:  Muscle bulk is normal.   Tone is increased in both legs, slightly more on the right than the left. She has normal strength in both arms. Strength is 0/5 in right leg and 1-2-/5 in left leg   Sensory: She has normal sensation to touch and vibration in the arms or proximally in the legs.  She has reduced vibration sensation at the ankles/toes  Coordination: Cerebellar testing reveals good finger-nose-finger .  RAM in hands is normal.   She is unable to do heel-to-shin  Gait and station: She is wheelchair bound and cannot stand   Reflexes: Deep tendon reflexes are normal in the arms. She has increased reflexes at the knees with spread. She has reduced reflexes at the ankles.  No ankle clonus.Marland Kitchen     DIAGNOSTIC DATA (LABS, IMAGING, TESTING) - I reviewed patient records, labs, notes, testing and imaging myself where available.      ASSESSMENT AND PLAN  Multiple sclerosis (HCC)  Leg weakness, bilateral  Diplegia of both lower extremities ([place])  Dysesthesia  Endometrial cancer ([place])    1.  Continue Betaseron.  She has not had any recent exacerbations and prefers to hold off on an MRI at this time to determine if she is having any subclinical activity.    If she has more rapid changes we will need to reconsider.  Check labs 2.  Continue baclofen and increase to 5 x 20 mg daily.   3.  Continue her other medications.  4.   She will follow-up in 12 months or sooner if there are new or worsening neurologic symptoms   [name] A. Felecia Shelling, MD, PhD 1/82/9937, 1:69 PM Certified in Neurology, Clinical Neurophysiology, Sleep Medicine, Pain Medicine and Neuroimaging  [facility] [address] [place], [place] [address] [phone]

## 2019-12-20 ENCOUNTER — Ambulatory Visit
Admission: RE | Admit: 2019-12-20 | Discharge: 2019-12-20 | Disposition: A | Discharge status: Home / Self Care | Payer: Medicare Other | Source: Ambulatory Visit | Attending: Radiation Oncology | Admitting: Radiation Oncology

## 2019-12-20 ENCOUNTER — Encounter: Payer: Self-pay | Admitting: Radiation Oncology

## 2019-12-20 ENCOUNTER — Telehealth: Payer: Self-pay | Admitting: *Deleted

## 2019-12-20 ENCOUNTER — Other Ambulatory Visit: Payer: Self-pay

## 2019-12-20 DIAGNOSIS — C541 Malignant neoplasm of endometrium: Secondary | ICD-10-CM

## 2019-12-20 DIAGNOSIS — Z79899 Other long term (current) drug therapy: Secondary | ICD-10-CM | POA: Diagnosis not present

## 2019-12-20 DIAGNOSIS — Z8542 Personal history of malignant neoplasm of other parts of uterus: Secondary | ICD-10-CM | POA: Diagnosis not present

## 2019-12-20 DIAGNOSIS — Z7984 Long term (current) use of oral hypoglycemic drugs: Secondary | ICD-10-CM | POA: Insufficient documentation

## 2019-12-20 DIAGNOSIS — Z08 Encounter for follow-up examination after completed treatment for malignant neoplasm: Secondary | ICD-10-CM | POA: Diagnosis not present

## 2019-12-20 LAB — COMPREHENSIVE METABOLIC PANEL
ALT: 8 IU/L (ref 0–32)
AST: 9 IU/L (ref 0–40)
Albumin/Globulin Ratio: 1.6 (ref 1.2–2.2)
Albumin: 4.2 g/dL (ref 3.8–4.9)
Alkaline Phosphatase: 74 IU/L (ref 39–117)
BUN/Creatinine Ratio: 10 (ref 9–23)
BUN: 8 mg/dL (ref 6–24)
Bilirubin Total: <0.2 mg/dL (ref 0.0–1.2)
CO2: 26 mmol/L (ref 20–29)
Calcium: 9.5 mg/dL (ref 8.7–10.2)
Chloride: 97 mmol/L (ref 96–106)
Creatinine, Ser: 0.81 mg/dL (ref 0.57–1.00)
GFR calc Af Amer: 92 mL/min/{1.73_m2} (ref >59)
GFR calc non Af Amer: 80 mL/min/{1.73_m2} (ref >59)
Globulin, Total: 2.7 g/dL (ref 1.5–4.5)
Glucose: 213 mg/dL — ABNORMAL HIGH (ref 65–99)
Potassium: 4.3 mmol/L (ref 3.5–5.2)
Sodium: 141 mmol/L (ref 134–144)
Total Protein: 6.9 g/dL (ref 6.0–8.5)

## 2019-12-20 LAB — CBC WITH DIFFERENTIAL/PLATELET
Basophils Absolute: 0 10*3/uL (ref 0.0–0.2)
Basos: 0 %
EOS (ABSOLUTE): 0.1 10*3/uL (ref 0.0–0.4)
Eos: 2 %
Hematocrit: 33.3 % — ABNORMAL LOW (ref 34.0–46.6)
Hemoglobin: 10.4 g/dL — ABNORMAL LOW (ref 11.1–15.9)
Immature Grans (Abs): 0 10*3/uL (ref 0.0–0.1)
Immature Granulocytes: 0 %
Lymphocytes Absolute: 0.6 10*3/uL — ABNORMAL LOW (ref 0.7–3.1)
Lymphs: 11 %
MCH: 26.1 pg — ABNORMAL LOW (ref 26.6–33.0)
MCHC: 31.2 g/dL — ABNORMAL LOW (ref 31.5–35.7)
MCV: 84 fL (ref 79–97)
Monocytes Absolute: 0.4 10*3/uL (ref 0.1–0.9)
Monocytes: 8 %
Neutrophils Absolute: 4.2 10*3/uL (ref 1.4–7.0)
Neutrophils: 79 %
Platelets: 483 10*3/uL — ABNORMAL HIGH (ref 150–450)
RBC: 3.98 x10E6/uL (ref 3.77–5.28)
RDW: 14.1 % (ref 11.7–15.4)
WBC: 5.3 10*3/uL (ref 3.4–10.8)

## 2019-12-20 LAB — VITAMIN D 25 HYDROXY (VIT D DEFICIENCY, FRACTURES): Vit D, 25-Hydroxy: 26.3 ng/mL — ABNORMAL LOW (ref 30.0–100.0)

## 2019-12-20 LAB — TSH: TSH: 1.14 u[IU]/mL (ref 0.450–4.500)

## 2019-12-20 LAB — HEMOGLOBIN A1C
Est. average glucose Bld gHb Est-mCnc: 151 mg/dL
Hgb A1c MFr Bld: 6.9 % — ABNORMAL HIGH (ref 4.8–5.6)

## 2019-12-20 NOTE — Progress Notes (Signed)
Radiation Oncology         [phone] ________________________________  Name: [name] MRN: [id number]  Date: 12/20/2019  DOB: 10-12-1961  Follow-Up Visit Note  CC: Forrest Moron, MD  Forrest Moron, MD    ICD-10-CM   1. Endometrial cancer ([place])  C54.1     Diagnosis:   Endometrial cancer grade 2, stage IB, pT1b, pN0(i+)  Interval Since Last Radiation:  8 months, 1 week 1. 02/14/2019 - 03/20/2019 ([facility]): Pelvis / 45 Gy in 25 fractions 2. 4/21, 4/29, 04/11/2019 (HDR, [facility]): Vaginal Cuff (cylinder, 3 cm diameter) / 18 Gy in 3 fractions (3 cm tx length)  Narrative:  The patient returns today for routine follow-up. She last saw Dr. Denman George on 09/07/2019 with no evidence of disease on exam.  On review of systems, she denies any pelvic pain or vaginal bleeding.  She is unable to use a vaginal dilator given her medical issues.  ALLERGIES:  has No Known Allergies.  Meds: Current Outpatient Medications  Medication Sig Dispense Refill  . atorvastatin (LIPITOR) 10 MG tablet Take 10 mg by mouth every evening.     . baclofen (LIORESAL) 20 MG tablet Take one po tid plus two po at bedtime 150 tablet 11  . Catheters (DOVER UNIVERSAL FOLEY TRAY) KIT Use foley tray kit to change catheter every 3 weeks or as needed 15 each 1  . Catheters (FOLEY CATHETER 2-WAY) MISC Use to aspirate urine suprapubic 15 each 1  . Cholecalciferol (D3-1000) 1000 units capsule Take 1,000 Units by mouth 2 (two) times daily. MORNING & AFTERNOON.    . cyanocobalamin 500 MCG tablet Take 500 mcg by mouth daily.    . furosemide (LASIX) 20 MG tablet TAKE 1 TABLET BY MOUTH ONCE DAILY 90 tablet 3  . glipiZIDE (GLUCOTROL) 5 MG tablet Take 1 tablet (5 mg total) by mouth daily before lunch. (Patient taking differently: Take 5 mg by mouth daily. ) 90 tablet 3  . hydrochlorothiazide (HYDRODIURIL) 25 MG tablet TAKE 1 TABLET BY MOUTH ONCE DAILY EVERY MORNING 90 tablet 0  . hydrocortisone (ANUSOL-HC) 2.5 % rectal cream Place 1  application rectally 2 (two) times daily. 30 g 0  . hydrocortisone (ANUSOL-HC) 25 MG suppository Place 1 suppository (25 mg total) rectally 2 (two) times daily. 12 suppository 0  . Incontinence Supply Disposable (TENA FLEX 16 PLUS) MISC Use daily as needed 30 each 11  . Incontinence Supply Disposable (TENA PROTECT UNDERWEAR PLS/XLG) MISC Use for incontinence. 30 each 11  . Interferon Beta-1b (BETASERON) 0.3 MG KIT injection Inject 0.3 mg into the skin every other day. 36 each 3  . metFORMIN (GLUCOPHAGE) 500 MG tablet Take 500 mg by mouth 2 (two) times daily.  3  . MOVANTIK 25 MG TABS tablet Take 1 tablet (25 mg total) by mouth every other day. IN THE MORNING. 30 tablet 4  . MYRBETRIQ 50 MG TB24 tablet Take 50 mg by mouth daily.    Salley Scarlet FORMULARY Power wheelchair repairs  Diagnosis code z99.3 1 each 0  . NON FORMULARY Dispense catheter urine drainage bags 90 Bag 3  . nystatin ointment (MYCOSTATIN) Apply to affected area twice daily as needed for irritation (Patient taking differently: 1 application. Apply to affected area twice daily as needed for irritation) 30 g 6  . Ostomy Supplies (NEW IMAGE SKIN/FLANGE/TAPE) MISC 1 application by Does not apply route daily as needed. 30 each 11  . oxyCODONE-acetaminophen (PERCOCET) 10-325 MG tablet Take 1 tablet by mouth every  8 (eight) hours as needed for pain. 90 tablet 0  . potassium chloride (KLOR-CON) 10 MEQ tablet TAKE 3 TABLETS BY MOUTH TWICE DAILY 180 tablet 3  . promethazine (PHENERGAN) 25 MG tablet Take 1 tablet (25 mg total) by mouth every 6 (six) hours as needed for nausea or vomiting. 45 tablet 0  . senna-docusate (SENOKOT-S) 8.6-50 MG tablet Take 2 tablets by mouth at bedtime. Do not take if having loose stools 30 tablet 1  . Syringe, Disposable, (B-D SYRINGE LUER-LOK 30CC) 30 ML MISC Use to change catheter 15 each 0   No current facility-administered medications for this encounter.   Facility-Administered Medications Ordered in Other  Encounters  Medication Dose Route Frequency Provider Last Rate Last Admin  . botulinum toxin Type A (BOTOX) injection 200 Units  200 Units Intramuscular Once Festus Aloe, MD        Physical Findings: The patient is in no acute distress. Patient is alert and oriented.  temporal temperature is 98.3 F (36.8 C) (pended). Her blood pressure is 152/81 (abnormal, pended) and her pulse is 69 (pended). Her respiration is 18 (pended) and oxygen saturation is 98% (pended). .  No significant changes. Lungs are clear to auscultation bilaterally. Heart has regular rate and rhythm. No palpable cervical, supraclavicular, or axillary adenopathy. Abdomen soft, non-tender, normal bowel sounds. On pelvic examination the external genitalia were unremarkable. A speculum exam was performed. There are no mucosal lesions noted in the vaginal vault.  On bimanual examination no pelvic mass is appreciated.  Vaginal mucosa smooth to palpation.  Lab Findings: Lab Results  Component Value Date   WBC 5.3 12/19/2019   HGB 10.4 (L) 12/19/2019   HCT 33.3 (L) 12/19/2019   MCV 84 12/19/2019   PLT 483 (H) 12/19/2019    Radiographic Findings: No results found.  Impression: No evidence of recurrence on clinical exam  Plan:   Follow up with Dr. Denman George in 3 months and with radiation oncology in 6 months.  ____________________________________ Gery Pray, MD   This document serves as a record of services personally performed by Gery Pray, MD. It was created on his behalf by Wilburn Mylar, a trained medical scribe. The creation of this record is based on the scribe's personal observations and the provider's statements to them. This document has been checked and approved by the attending provider.

## 2019-12-20 NOTE — Patient Instructions (Signed)
Coronavirus (COVID-19) Are you at risk?  Are you at risk for the Coronavirus (COVID-19)?  To be considered HIGH RISK for Coronavirus (COVID-19), you have to meet the following criteria:  . Traveled to [place], [place], [place], [place] or [place]; or in the [place] to [place], [place], [place], or [place]; and have fever, cough, and shortness of breath within the last 2 weeks of travel OR . Been in close contact with a person diagnosed with COVID-19 within the last 2 weeks and have fever, cough, and shortness of breath . IF YOU DO NOT MEET THESE CRITERIA, YOU ARE CONSIDERED LOW RISK FOR COVID-19.  What to do if you are HIGH RISK for COVID-19?  . If you are having a medical emergency, call 911. . Seek medical care right away. Before you go to a doctor's office, urgent care or emergency department, call ahead and tell them about your recent travel, contact with someone diagnosed with COVID-19, and your symptoms. You should receive instructions from your physician's office regarding next steps of care.  . When you arrive at healthcare provider, tell the healthcare staff immediately you have returned from visiting [place], [place], [place], [place] or [place]; or traveled in the [place] to [place], [place], [place], or [place]; in the last two weeks or you have been in close contact with a person diagnosed with COVID-19 in the last 2 weeks.   . Tell the health care staff about your symptoms: fever, cough and shortness of breath. . After you have been seen by a medical provider, you will be either: o Tested for (COVID-19) and discharged home on quarantine except to seek medical care if symptoms worsen, and asked to  - Stay home and avoid contact with others until you get your results (4-5 days)  - Avoid travel on public transportation if possible (such as bus, train, or airplane) or o Sent to the Emergency Department by EMS for evaluation, COVID-19 testing, and possible  admission depending on your condition and test results.  What to do if you are LOW RISK for COVID-19?  Reduce your risk of any infection by using the same precautions used for avoiding the common cold or flu:  . Wash your hands often with soap and warm water for at least 20 seconds.  If soap and water are not readily available, use an alcohol-based hand sanitizer with at least 60% alcohol.  . If coughing or sneezing, cover your mouth and nose by coughing or sneezing into the elbow areas of your shirt or coat, into a tissue or into your sleeve (not your hands). . Avoid shaking hands with others and consider head nods or verbal greetings only. . Avoid touching your eyes, nose, or mouth with unwashed hands.  . Avoid close contact with people who are sick. . Avoid places or events with large numbers of people in one location, like concerts or sporting events. . Carefully consider travel plans you have or are making. . If you are planning any travel outside or inside the [place], visit the CDC's Travelers' Health webpage for the latest health notices. . If you have some symptoms but not all symptoms, continue to monitor at home and seek medical attention if your symptoms worsen. . If you are having a medical emergency, call 911.   ADDITIONAL HEALTHCARE OPTIONS FOR PATIENTS  [facility] / e-Visit: [email or web address]         [facility]: [phone]  [facility]: [phone]                   [facility]: [phone]   

## 2019-12-20 NOTE — Telephone Encounter (Signed)
Tried calling pt on [phone]. Phone continued to ring. Tried calling [phone]. LVM For [name] (daughter on [place]) to call office.

## 2019-12-20 NOTE — Telephone Encounter (Signed)
-----   Message from Britt Bottom, MD sent at 12/20/2019 12:23 PM EST ----- Vit D was low.   Lets do  50,000 U weekly x 26 weeks then 500U daily OTC.   Her glucose and HgbA1c were a little high --- she can discuss further with her PCP who should have access to the labs.

## 2019-12-20 NOTE — Progress Notes (Signed)
Pt presents today for f/u with Dr. Sondra Come. Pt reports generalized, chronic pain, rated 3/10. Pt denies hematuria. Pt with indwelling suprapubic catheter. Pt denies vaginal bleeding/discharge. Pt denies rectal bleeding. Pt reports constipation, with laxatives providing good relief. Pt denies abdominal bloating, N/V.  BP (!) (P) 152/81 (BP Location: Left Wrist, Patient Position: Sitting)   Pulse (P) 69   Temp (P) 98.3 F (36.8 C) (Temporal)   Resp (P) 18   SpO2 (P) 98%   Wt Readings from Last 3 Encounters:  11/28/19 300 lb (136.1 kg)  09/07/19 300 lb (136.1 kg)  06/27/19 300 lb (136.1 kg)   Loma Sousa, RN BSN

## 2019-12-21 DIAGNOSIS — N318 Other neuromuscular dysfunction of bladder: Secondary | ICD-10-CM | POA: Diagnosis not present

## 2019-12-25 ENCOUNTER — Telehealth: Payer: Self-pay | Admitting: *Deleted

## 2019-12-25 MED ORDER — VITAMIN D (ERGOCALCIFEROL) 1.25 MG (50000 UNIT) PO CAPS: ORAL_CAPSULE | ORAL | 1 refills | Status: DC

## 2019-12-25 NOTE — Telephone Encounter (Signed)
I have spoken to the patient and provided her with the lab results.  She was in agreement to the vitamin D supplement and verbalized understanding of the dosing schedule. She will also follow up with her PCP for her glucose and A1C.

## 2019-12-25 NOTE — Telephone Encounter (Signed)
-----   Message from Britt Bottom, MD sent at 12/20/2019 12:23 PM EST ----- Vit D was low.   Lets do  50,000 U weekly x 26 weeks then 500U daily OTC.   Her glucose and HgbA1c were a little high --- she can discuss further with her PCP who should have access to the labs.

## 2019-12-28 ENCOUNTER — Ambulatory Visit: Payer: Medicare Other | Admitting: Family Medicine

## 2020-01-01 ENCOUNTER — Other Ambulatory Visit: Payer: Self-pay | Admitting: Urology

## 2020-01-07 ENCOUNTER — Telehealth: Payer: Self-pay

## 2020-01-07 DIAGNOSIS — K5901 Slow transit constipation: Secondary | ICD-10-CM | POA: Diagnosis not present

## 2020-01-07 DIAGNOSIS — L89324 Pressure ulcer of left buttock, stage 4: Secondary | ICD-10-CM | POA: Diagnosis not present

## 2020-01-07 DIAGNOSIS — I10 Essential (primary) hypertension: Secondary | ICD-10-CM | POA: Diagnosis not present

## 2020-01-07 DIAGNOSIS — E1142 Type 2 diabetes mellitus with diabetic polyneuropathy: Secondary | ICD-10-CM | POA: Diagnosis not present

## 2020-01-07 DIAGNOSIS — G35 Multiple sclerosis: Secondary | ICD-10-CM | POA: Diagnosis not present

## 2020-01-07 DIAGNOSIS — D649 Anemia, unspecified: Secondary | ICD-10-CM | POA: Diagnosis not present

## 2020-01-07 NOTE — Telephone Encounter (Signed)
Also places in scan bin

## 2020-01-07 NOTE — Telephone Encounter (Signed)
Home Health orders received from [facility]. Forms signed and faxed to [phone] with confirmation.

## 2020-01-09 ENCOUNTER — Other Ambulatory Visit: Payer: Self-pay

## 2020-01-09 ENCOUNTER — Ambulatory Visit (INDEPENDENT_AMBULATORY_CARE_PROVIDER_SITE_OTHER): Payer: Medicare Other | Admitting: Family Medicine

## 2020-01-09 ENCOUNTER — Encounter: Payer: Self-pay | Admitting: Family Medicine

## 2020-01-09 VITALS — BP 138/79 | HR 73 | Temp 97.2°F | Resp 17 | Ht 67.0 in | Wt 300.0 lb

## 2020-01-09 DIAGNOSIS — Z79891 Long term (current) use of opiate analgesic: Secondary | ICD-10-CM

## 2020-01-09 DIAGNOSIS — L89324 Pressure ulcer of left buttock, stage 4: Secondary | ICD-10-CM | POA: Diagnosis not present

## 2020-01-09 DIAGNOSIS — M1990 Unspecified osteoarthritis, unspecified site: Secondary | ICD-10-CM | POA: Diagnosis not present

## 2020-01-09 DIAGNOSIS — E1142 Type 2 diabetes mellitus with diabetic polyneuropathy: Secondary | ICD-10-CM

## 2020-01-09 DIAGNOSIS — Z23 Encounter for immunization: Secondary | ICD-10-CM | POA: Diagnosis not present

## 2020-01-09 DIAGNOSIS — R32 Unspecified urinary incontinence: Secondary | ICD-10-CM | POA: Diagnosis not present

## 2020-01-09 DIAGNOSIS — Z8542 Personal history of malignant neoplasm of other parts of uterus: Secondary | ICD-10-CM | POA: Diagnosis not present

## 2020-01-09 DIAGNOSIS — D649 Anemia, unspecified: Secondary | ICD-10-CM | POA: Diagnosis not present

## 2020-01-09 DIAGNOSIS — Z6841 Body Mass Index (BMI) 40.0 and over, adult: Secondary | ICD-10-CM | POA: Diagnosis not present

## 2020-01-09 DIAGNOSIS — Z993 Dependence on wheelchair: Secondary | ICD-10-CM | POA: Diagnosis not present

## 2020-01-09 DIAGNOSIS — G35 Multiple sclerosis: Secondary | ICD-10-CM

## 2020-01-09 DIAGNOSIS — K5901 Slow transit constipation: Secondary | ICD-10-CM | POA: Diagnosis not present

## 2020-01-09 DIAGNOSIS — I10 Essential (primary) hypertension: Secondary | ICD-10-CM | POA: Diagnosis not present

## 2020-01-09 DIAGNOSIS — E785 Hyperlipidemia, unspecified: Secondary | ICD-10-CM | POA: Diagnosis not present

## 2020-01-09 DIAGNOSIS — Z7984 Long term (current) use of oral hypoglycemic drugs: Secondary | ICD-10-CM | POA: Diagnosis not present

## 2020-01-09 DIAGNOSIS — Z9181 History of falling: Secondary | ICD-10-CM | POA: Diagnosis not present

## 2020-01-09 MED ORDER — MOVANTIK 25 MG PO TABS: 75.0000 mg | ORAL_TABLET | Freq: Every day | ORAL | 4 refills | Status: DC

## 2020-01-09 MED ORDER — OXYCODONE-ACETAMINOPHEN 10-325 MG PO TABS: 1.0000 | ORAL_TABLET | Freq: Three times a day (TID) | ORAL | 0 refills | Status: DC | PRN

## 2020-01-09 NOTE — Progress Notes (Signed)
Established Patient Office Visit  Subjective:  Patient ID: [name], female    DOB: 1961-03-20  Age: 59 y.o. MRN: [id number]  CC:  Chief Complaint  Patient presents with  . 6 month f/u    chronic conditions  . Medication Refill    percocet and movantik    HPI [name] presents for  Patient reports that she will get botox on the bladder from Urology She stats that she will get this done on 2/16 She reports that she now has a stage 4 decubitus ulcers  She would like to get a new hospital bed as the be she is on is too small and is a twin bed and she cannot get into a comfortable place It also aggravates her stage 4 ulcers She also cannot position herself in the bed.  She would like a bariatric bed which she had before.  Wt Readings from Last 3 Encounters:  01/09/20 300 lb (136.1 kg)  11/28/19 300 lb (136.1 kg)  09/07/19 300 lb (136.1 kg)   Hypertension: Patient here for follow-up of elevated blood pressure. She is not exercising as she is wheelchair bound and is adherent to low salt diet.  Blood pressure is well controlled at home. Cardiac symptoms none. Patient denies chest pain, chest pressure/discomfort, dyspnea, exertional chest pressure/discomfort and fatigue.  Cardiovascular risk factors: diabetes mellitus and hypertension. Use of agents associated with hypertension: none. History of target organ damage: none. BP Readings from Last 3 Encounters:  01/09/20 138/79  12/20/19 (!) (P) 152/81  12/19/19 132/71    Chronic pain meds Pt needs refills of her percocet She also takes the movantik to help with the chronic constipation She denies any stool impaction   Leakage of urine She states that she will get bladder botox in 2 weeks The foley has to be changed every 3 weeks She denies any reports of infection   Diabetes Her diabetes is stable Lab Results  Component Value Date   HGBA1C 6.9 (H) 12/19/2019   She denies hypoglycemia and limiting starches She is  compliant with her meds    Past Medical History:  Diagnosis Date  . Anemia    history of  . Arthritis   . Decubitus ulcers    currently treated at wound center  . Diabetes mellitus without complication ([facility])   . Diabetic polyneuropathy ([facility])   . Endometrial cancer ([facility])    Grade 2  . Fibroids   . Heartburn    Occ  . Hypertension   . Mixed hyperlipidemia   . Morbid obesity ([facility])   . MS (multiple sclerosis) ([facility])     Past Surgical History:  Procedure Laterality Date  . COLONOSCOPY    . ESOPHAGOGASTRODUODENOSCOPY N/A 12/02/2017   Procedure: ESOPHAGOGASTRODUODENOSCOPY (EGD);  Surgeon: Ronnette Juniper, MD;  Location: [facility];  Service: Gastroenterology;  Laterality: N/A;  . ROBOTIC ASSISTED TOTAL HYSTERECTOMY WITH BILATERAL SALPINGO OOPHERECTOMY Bilateral 12/19/2018   Procedure: XI ROBOTIC ASSISTED TOTAL HYSTERECTOMY (UTERUS GREATER THAN 250gr) WITH BILATERAL SALPINGO OOPHORECTOMY;  Surgeon: Everitt Amber, MD;  Location: [facility];  Service: Gynecology;  Laterality: Bilateral;  . SENTINEL NODE BIOPSY N/A 12/19/2018   Procedure: SENTINEL NODE BIOPSY;  Surgeon: Everitt Amber, MD;  Location: [facility];  Service: Gynecology;  Laterality: N/A;  . SUPRAPUBIC CATHETER INSERTION  2009  . TONSILLECTOMY    . TUBAL LIGATION      Family History  Problem Relation Age of Onset  . Cancer Mother   . Hypertension Mother   .  Heart disease Father   . Diabetes Father   . Hypertension Sister   . Hypertension Brother   . Breast cancer Neg Hx     Social History   Socioeconomic History  . Marital status: Single    Spouse name: Not on file  . Number of children: Not on file  . Years of education: Not on file  . Highest education level: Not on file  Occupational History  . Not on file  Tobacco Use  . Smoking status: Never Smoker  . Smokeless tobacco: Never Used  Substance and Sexual Activity  . Alcohol use: No  . Drug use: No  . Sexual activity: Not on file  Other Topics Concern  . Not on  file  Social History Narrative  . Not on file   Social Determinants of Health   Financial Resource Strain:   . Difficulty of Paying Living Expenses: Not on file  Food Insecurity:   . Worried About Charity fundraiser in the Last Year: Not on file  . Ran Out of Food in the Last Year: Not on file  Transportation Needs:   . Lack of Transportation (Medical): Not on file  . Lack of Transportation (Non-Medical): Not on file  Physical Activity:   . Days of Exercise per Week: Not on file  . Minutes of Exercise per Session: Not on file  Stress:   . Feeling of Stress : Not on file  Social Connections:   . Frequency of Communication with Friends and Family: Not on file  . Frequency of Social Gatherings with Friends and Family: Not on file  . Attends Religious Services: Not on file  . Active Member of Clubs or Organizations: Not on file  . Attends Archivist Meetings: Not on file  . Marital Status: Not on file  Intimate Partner Violence:   . Fear of Current or Ex-Partner: Not on file  . Emotionally Abused: Not on file  . Physically Abused: Not on file  . Sexually Abused: Not on file    Outpatient Medications Prior to Visit  Medication Sig Dispense Refill  . atorvastatin (LIPITOR) 10 MG tablet Take 10 mg by mouth every evening.     . baclofen (LIORESAL) 20 MG tablet Take one po tid plus two po at bedtime 150 tablet 11  . Catheters (DOVER UNIVERSAL FOLEY TRAY) KIT Use foley tray kit to change catheter every 3 weeks or as needed 15 each 1  . Catheters (FOLEY CATHETER 2-WAY) MISC Use to aspirate urine suprapubic 15 each 1  . Cholecalciferol (D3-1000) 1000 units capsule Take 1,000 Units by mouth 2 (two) times daily. MORNING & AFTERNOON.    . cyanocobalamin 500 MCG tablet Take 500 mcg by mouth daily.    . furosemide (LASIX) 20 MG tablet TAKE 1 TABLET BY MOUTH ONCE DAILY 90 tablet 3  . glipiZIDE (GLUCOTROL) 5 MG tablet Take 1 tablet (5 mg total) by mouth daily before lunch. (Patient  taking differently: Take 5 mg by mouth daily. ) 90 tablet 3  . hydrochlorothiazide (HYDRODIURIL) 25 MG tablet TAKE 1 TABLET BY MOUTH ONCE DAILY EVERY MORNING 90 tablet 0  . hydrocortisone (ANUSOL-HC) 2.5 % rectal cream Place 1 application rectally 2 (two) times daily. 30 g 0  . hydrocortisone (ANUSOL-HC) 25 MG suppository Place 1 suppository (25 mg total) rectally 2 (two) times daily. 12 suppository 0  . Incontinence Supply Disposable (TENA FLEX 16 PLUS) MISC Use daily as needed 30 each 11  . Interferon  Beta-1b (BETASERON) 0.3 MG KIT injection Inject 0.3 mg into the skin every other day. 36 each 3  . metFORMIN (GLUCOPHAGE) 500 MG tablet Take 500 mg by mouth 2 (two) times daily.  3  . MYRBETRIQ 50 MG TB24 tablet Take 50 mg by mouth daily.    [facility] FORMULARY Power wheelchair repairs  Diagnosis code z99.3 1 each 0  . NON FORMULARY Dispense catheter urine drainage bags 90 Bag 3  . nystatin ointment (MYCOSTATIN) Apply to affected area twice daily as needed for irritation (Patient taking differently: 1 application. Apply to affected area twice daily as needed for irritation) 30 g 6  . Ostomy Supplies (NEW IMAGE SKIN/FLANGE/TAPE) MISC 1 application by Does not apply route daily as needed. 30 each 11  . potassium chloride (KLOR-CON) 10 MEQ tablet TAKE 3 TABLETS BY MOUTH TWICE DAILY 180 tablet 3  . senna-docusate (SENOKOT-S) 8.6-50 MG tablet Take 2 tablets by mouth at bedtime. Do not take if having loose stools 30 tablet 1  . Vitamin D, Ergocalciferol, (DRISDOL) 1.25 MG (50000 UNIT) CAPS capsule Take one cap every 7 days for 26 weeks then start OTC 5000 units per day. 13 capsule 1  . MOVANTIK 25 MG TABS tablet Take 1 tablet (25 mg total) by mouth every other day. IN THE MORNING. 30 tablet 4  . oxyCODONE-acetaminophen (PERCOCET) 10-325 MG tablet Take 1 tablet by mouth every 8 (eight) hours as needed for pain. 90 tablet 0  . Incontinence Supply Disposable (TENA PROTECT UNDERWEAR PLS/XLG) MISC Use for  incontinence. 30 each 11  . promethazine (PHENERGAN) 25 MG tablet Take 1 tablet (25 mg total) by mouth every 6 (six) hours as needed for nausea or vomiting. 45 tablet 0  . Syringe, Disposable, (B-D SYRINGE LUER-LOK 30CC) 30 ML MISC Use to change catheter 15 each 0   Facility-Administered Medications Prior to Visit  Medication Dose Route Frequency Provider Last Rate Last Admin  . botulinum toxin Type A (BOTOX) injection 200 Units  200 Units Intramuscular Once Festus Aloe, MD        No Known Allergies  ROS Review of Systems See hpi   Objective:    Physical Exam  BP 138/79 (BP Location: Left Arm, Patient Position: Sitting, Cuff Size: Large)   Pulse 73   Temp (!) 97.2 F (36.2 C) (Oral)   Resp 17   Ht '5\' 7"'$  (1.702 m)   Wt 300 lb (136.1 kg)   SpO2 96%   BMI 46.99 kg/m  Wt Readings from Last 3 Encounters:  01/09/20 300 lb (136.1 kg)  11/28/19 300 lb (136.1 kg)  09/07/19 300 lb (136.1 kg)   Physical Exam  Constitutional: Oriented to person, place, and time. Appears well-developed and well-nourished. Wheel chair bound obese patient. HENT:  Head: Normocephalic and atraumatic.  Eyes: Conjunctivae and EOM are normal.  Cardiovascular: Normal rate, regular rhythm, normal heart sounds and intact distal pulses.  No murmur heard. Pulmonary/Chest: Effort normal and breath sounds normal. No stridor. No respiratory distress. Has no wheezes.  Neurological: Is alert and oriented to person, place, and time.  Skin: Skin is warm. Capillary refill takes less than 2 seconds.  Psychiatric: Has a normal mood and affect. Behavior is normal. Judgment and thought content normal.    Health Maintenance Due  Topic Date Due  . OPHTHALMOLOGY EXAM  01/09/1970  . HIV Screening  01/09/1975  . URINE MICROALBUMIN  12/15/2017  . INFLUENZA VACCINE  07/07/2019  . FOOT EXAM  07/20/2019  There are no preventive care reminders to display for this patient.  Lab Results  Component Value Date   TSH  1.140 12/19/2019   Lab Results  Component Value Date   WBC 5.3 12/19/2019   HGB 10.4 (L) 12/19/2019   HCT 33.3 (L) 12/19/2019   MCV 84 12/19/2019   PLT 483 (H) 12/19/2019   Lab Results  Component Value Date   NA 141 12/19/2019   K 4.3 12/19/2019   CO2 26 12/19/2019   GLUCOSE 213 (H) 12/19/2019   BUN 8 12/19/2019   CREATININE 0.81 12/19/2019   BILITOT <0.2 12/19/2019   ALKPHOS 74 12/19/2019   AST 9 12/19/2019   ALT 8 12/19/2019   PROT 6.9 12/19/2019   ALBUMIN 4.2 12/19/2019   CALCIUM 9.5 12/19/2019   ANIONGAP 10 12/20/2018   Lab Results  Component Value Date   CHOL 151 06/27/2019   Lab Results  Component Value Date   HDL 62 06/27/2019   Lab Results  Component Value Date   LDLCALC 72 06/27/2019   Lab Results  Component Value Date   TRIG 83 06/27/2019   Lab Results  Component Value Date   CHOLHDL 2.4 06/27/2019   Lab Results  Component Value Date   HGBA1C 6.9 (H) 12/19/2019      Assessment & Plan:   Problem List Items Addressed This Visit      Digestive   Slow transit constipation (Chronic)-  Refill as appropriate.    Relevant Medications   MOVANTIK 25 MG TABS tablet     Endocrine   Type 2 diabetes mellitus with diabetic polyneuropathy, without long-term current use of insulin (HCC) - Primary (Chronic)  - stable      Nervous and Auditory   Multiple sclerosis ([facility]) (Chronic) - continue current meds, advised covid vaccine and put pt on the wait list.    Relevant Medications   oxyCODONE-acetaminophen (PERCOCET) 10-325 MG tablet     Other   Chronically on opiate therapy (Chronic)  -  Continue current meds, PMDP appropriate   Relevant Medications   oxyCODONE-acetaminophen (PERCOCET) 10-325 MG tablet    Other Visit Diagnoses    Need for Tdap vaccination       Relevant Orders   Tdap vaccine greater than or equal to 7yo IM (Completed)      Meds ordered this encounter  Medications  . MOVANTIK 25 MG TABS tablet    Sig: Take 3 tablets (75 mg  total) by mouth daily.    Dispense:  90 tablet    Refill:  4    Corrected frequency to every day  . oxyCODONE-acetaminophen (PERCOCET) 10-325 MG tablet    Sig: Take 1 tablet by mouth every 8 (eight) hours as needed for pain.    Dispense:  90 tablet    Refill:  0    Follow-up: No follow-ups on file.    Forrest Moron, MD

## 2020-01-09 NOTE — Patient Instructions (Signed)
     If you have lab work done today you will be contacted with your lab results within the next 2 weeks.  If you have not heard from us then please contact us. The fastest way to get your results is to register for My Chart.   IF you received an x-ray today, you will receive an invoice from [facility]. Please contact [facility] at [phone] with questions or concerns regarding your invoice.   IF you received labwork today, you will receive an invoice from [facility]. Please contact [facility] at [phone] with questions or concerns regarding your invoice.   Our billing staff will not be able to assist you with questions regarding bills from these companies.  You will be contacted with the lab results as soon as they are available. The fastest way to get your results is to activate your My Chart account. Instructions are located on the last page of this paperwork. If you have not heard from us regarding the results in 2 weeks, please contact this office.        If you have lab work done today you will be contacted with your lab results within the next 2 weeks.  If you have not heard from us then please contact us. The fastest way to get your results is to register for My Chart.   IF you received an x-ray today, you will receive an invoice from [facility]. Please contact [facility] at [phone] with questions or concerns regarding your invoice.   IF you received labwork today, you will receive an invoice from [facility]. Please contact [facility] at [phone] with questions or concerns regarding your invoice.   Our billing staff will not be able to assist you with questions regarding bills from these companies.  You will be contacted with the lab results as soon as they are available. The fastest way to get your results is to activate your My Chart account. Instructions are located on the last page of this paperwork. If you have not heard from us  regarding the results in 2 weeks, please contact this office.     

## 2020-01-11 ENCOUNTER — Encounter (HOSPITAL_BASED_OUTPATIENT_CLINIC_OR_DEPARTMENT_OTHER): Payer: Medicare Other | Attending: Internal Medicine | Admitting: Internal Medicine

## 2020-01-11 ENCOUNTER — Other Ambulatory Visit: Payer: Self-pay

## 2020-01-11 DIAGNOSIS — M1990 Unspecified osteoarthritis, unspecified site: Secondary | ICD-10-CM | POA: Diagnosis not present

## 2020-01-11 DIAGNOSIS — Z993 Dependence on wheelchair: Secondary | ICD-10-CM | POA: Diagnosis not present

## 2020-01-11 DIAGNOSIS — N3020 Other chronic cystitis without hematuria: Secondary | ICD-10-CM | POA: Diagnosis not present

## 2020-01-11 DIAGNOSIS — E1151 Type 2 diabetes mellitus with diabetic peripheral angiopathy without gangrene: Secondary | ICD-10-CM | POA: Insufficient documentation

## 2020-01-11 DIAGNOSIS — L89319 Pressure ulcer of right buttock, unspecified stage: Secondary | ICD-10-CM | POA: Diagnosis not present

## 2020-01-11 DIAGNOSIS — E1140 Type 2 diabetes mellitus with diabetic neuropathy, unspecified: Secondary | ICD-10-CM | POA: Insufficient documentation

## 2020-01-11 DIAGNOSIS — L89313 Pressure ulcer of right buttock, stage 3: Secondary | ICD-10-CM | POA: Insufficient documentation

## 2020-01-11 DIAGNOSIS — G35 Multiple sclerosis: Secondary | ICD-10-CM | POA: Diagnosis not present

## 2020-01-11 DIAGNOSIS — I10 Essential (primary) hypertension: Secondary | ICD-10-CM | POA: Diagnosis not present

## 2020-01-11 DIAGNOSIS — E11622 Type 2 diabetes mellitus with other skin ulcer: Secondary | ICD-10-CM | POA: Insufficient documentation

## 2020-01-11 DIAGNOSIS — N318 Other neuromuscular dysfunction of bladder: Secondary | ICD-10-CM | POA: Diagnosis not present
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
## 2020-01-15 ENCOUNTER — Telehealth: Payer: Self-pay | Admitting: Family Medicine

## 2020-01-15 NOTE — Telephone Encounter (Signed)
Will place in provider's red folder for completion.

## 2020-01-15 NOTE — Telephone Encounter (Signed)
FAX FROM [facility] AT [place] AND [employer] ARRIVED FOR MEDICAL EQUIPMENT. [place] LABELED AND LEFT IN PROVIDERS BOX AT NURSES STATION.

## 2020-01-15 NOTE — Patient Instructions (Addendum)
DUE TO COVID-19 ONLY ONE VISITOR IS ALLOWED TO COME WITH YOU AND STAY IN THE WAITING ROOM ONLY DURING PRE OP AND PROCEDURE DAY OF SURGERY. THE 1 VISITOR MAY VISIT WITH YOU AFTER SURGERY IN YOUR PRIVATE ROOM DURING VISITING HOURS ONLY!  YOU NEED TO HAVE A COVID 19 TEST ON_______ @_______ , THIS TEST MUST BE DONE BEFORE SURGERY, COME  [place], [address] [place] , [id number].  ([place]) ONCE YOUR COVID TEST IS COMPLETED, PLEASE BEGIN THE QUARANTINE INSTRUCTIONS AS OUTLINED IN YOUR HANDOUT.                Maxiene Toburen     Your procedure is scheduled on: Tuesday 01/22/2020   Report to [facility] Main  Entrance     Report to admitting at   1000 AM     Call this number if you have problems the morning of surgery [phone]    How to Manage Your Diabetes Before and After Surgery  Why is it important to control my blood sugar before and after surgery? . Improving blood sugar levels before and after surgery helps healing and can limit problems. . A way of improving blood sugar control is eating a healthy diet by: o  Eating less sugar and carbohydrates o  Increasing activity/exercise o  Talking with your doctor about reaching your blood sugar goals . High blood sugars (greater than 180 mg/dL) can raise your risk of infections and slow your recovery, so you will need to focus on controlling your diabetes during the weeks before surgery. . Make sure that the doctor who takes care of your diabetes knows about your planned surgery including the date and location.  How do I manage my blood sugar before surgery? . Check your blood sugar at least 4 times a day, starting 2 days before surgery, to make sure that the level is not too high or low. o Check your blood sugar the morning of your surgery when you wake up and every 2 hours until you get to the Short Stay unit. . If your blood sugar is less than 70 mg/dL, you will need to treat for low blood sugar: o Do not take  insulin. o Treat a low blood sugar (less than 70 mg/dL) with  cup of clear juice (cranberry or apple), 4 glucose tablets, OR glucose gel. o Recheck blood sugar in 15 minutes after treatment (to make sure it is greater than 70 mg/dL). If your blood sugar is not greater than 70 mg/dL on recheck, call [phone] for further instructions. . Report your blood sugar to the short stay nurse when you get to Short Stay.  . If you are admitted to the hospital after surgery: o Your blood sugar will be checked by the staff and you will probably be given insulin after surgery (instead of oral diabetes medicines) to make sure you have good blood sugar levels. o The goal for blood sugar control after surgery is 80-180 mg/dL.   WHAT DO I DO ABOUT MY DIABETES MEDICATION?        The day before surgery, TAKE MORNING DOSE ONLY OF Glipizide (Glucotrol)!         The day before surgery, Taske Metformin (Glucophage) as prescribed!  . Do not take oral diabetes medicines (pills) the morning of surgery.    Remember: Do not eat food  :After Midnight.  May have clear liquids from midnight up until 0700 am then nothing until after surgery!  CLEAR LIQUID DIET   Foods Allowed                                                                     Foods Excluded  Coffee and tea, regular and decaf                             liquids that you cannot  Plain Jell-O any favor except red or purple                                           see through such as: Fruit ices (not with fruit pulp)                                     milk, soups, orange juice  Iced Popsicles                                    All solid food Carbonated beverages, regular and diet                                    Cranberry, grape and apple juices Sports drinks like Gatorade Lightly seasoned clear broth or consume(fat free) Sugar, honey syrup  Sample Menu Breakfast                                Lunch                                      Supper Cranberry juice                    Beef broth                            Chicken broth Jell-O                                     Grape juice                           Apple juice Coffee or tea                        Jell-O                                      Popsicle  Coffee or tea                        Coffee or tea  _____________________________________________________________________     BRUSH YOUR TEETH MORNING OF SURGERY AND RINSE YOUR MOUTH OUT, NO CHEWING GUM CANDY OR MINTS.     Take these medicines the morning of surgery with A SIP OF WATER: Baclofen, Oxycodone- Acetaminophen (Percoet)   DO NOT TAKE ANY DIABETIC MEDICATIONS DAY OF YOUR SURGERY!                               You may not have any metal on your body including hair pins and              piercings  Do not wear jewelry, make-up, lotions, powders or perfumes, deodorant             Do not wear nail polish on your fingernails.  Do not shave  48 hours prior to surgery.              Do not bring valuables to the hospital. [place].  Contacts, dentures or bridgework may not be worn into surgery.  Leave suitcase in the car. After surgery it may be brought to your room.     Patients discharged the day of surgery will not be allowed to drive home. IF YOU ARE HAVING SURGERY AND GOING HOME THE SAME DAY, YOU MUST HAVE AN ADULT TO DRIVE YOU HOME AND  BE WITH YOU FOR 24 HOURS. YOU MAY GO HOME BY TAXI OR UBER OR ORTHERWISE, BUT AN ADULT MUST ACCOMPANY YOU HOME AND STAY WITH YOU FOR 24 HOURS.  Name and phone number of your driver:daughter- Carlean Mahorney  [phone]                Please read over the following fact sheets you were given: _____________________________________________________________________             [facility] - Preparing for Surgery Before surgery, you can play an important role.  Because skin is  not sterile, your skin needs to be as free of germs as possible.  You can reduce the number of germs on your skin by washing with CHG (chlorahexidine gluconate) soap before surgery.  CHG is an antiseptic cleaner which kills germs and bonds with the skin to continue killing germs even after washing. Please DO NOT use if you have an allergy to CHG or antibacterial soaps.  If your skin becomes reddened/irritated stop using the CHG and inform your nurse when you arrive at Short Stay. Do not shave (including legs and underarms) for at least 48 hours prior to the first CHG shower.  You may shave your face/neck. Please follow these instructions carefully:  1.  Shower with CHG Soap the night before surgery and the  morning of Surgery.  2.  If you choose to wash your hair, wash your hair first as usual with your  normal  shampoo.  3.  After you shampoo, rinse your hair and body thoroughly to remove the  shampoo.                           4.  Use CHG as you would any other liquid soap.  You can  apply chg directly  to the skin and wash                       Gently with a scrungie or clean washcloth.  5.  Apply the CHG Soap to your body ONLY FROM THE NECK DOWN.   Do not use on face/ open                           Wound or open sores. Avoid contact with eyes, ears mouth and genitals (private parts).                       Wash face,  Genitals (private parts) with your normal soap.             6.  Wash thoroughly, paying special attention to the area where your surgery  will be performed.  7.  Thoroughly rinse your body with warm water from the neck down.  8.  DO NOT shower/wash with your normal soap after using and rinsing off  the CHG Soap.                9.  Pat yourself dry with a clean towel.            10.  Wear clean pajamas.            11.  Place clean sheets on your bed the night of your first shower and do not  sleep with pets. Day of Surgery : Do not apply any lotions/deodorants the morning of surgery.   Please wear clean clothes to the hospital/surgery center.  FAILURE TO FOLLOW THESE INSTRUCTIONS MAY RESULT IN THE CANCELLATION OF YOUR SURGERY PATIENT SIGNATURE_________________________________  NURSE SIGNATURE__________________________________  ________________________________________________________________________

## 2020-01-15 NOTE — Progress Notes (Signed)
MIYOKO, HASHIMI ([id number]) Visit Report for 01/11/2020 Arrival Information Details Patient Name: Date of Service: JUVIA, AERTS 01/11/2020 2:15 PM Medical Record [id number] Patient Account Number: [id number] Date of Birth/Sex: Treating RN: [birth year] (60 y.o. Nancy Fetter Primary Care Berklie Dethlefs: Delia Chimes Other Clinician: Referring Finn Altemose: Treating Temple Ewart/Extender:Robson, Shirley Muscat, ZOE Weeks in Treatment: 152 Visit Information History Since Last Visit Added or deleted any medications: No Patient Arrived: Wheel Chair Any new allergies or adverse reactions: No Arrival Time: 14:18 Had a fall or experienced change in No activities of daily living that may affect Accompanied By: alone risk of falls: Transfer Assistance: Hoyer Lift Signs or symptoms of abuse/neglect since last No Patient Identification Verified: Yes visito Secondary Verification Process Completed: Yes Hospitalized since last visit: No Patient Requires Transmission-Based No Implantable device outside of the clinic excluding No Precautions: cellular tissue based products placed in the center Patient Has Alerts: No since last visit: Has Dressing in Place as Prescribed: Yes Pain Present Now: Yes Electronic Signature(s) Signed: 01/15/2020 5:30:42 PM By: Levan Hurst RN, BSN Entered By: Levan Hurst on 01/11/2020 14:18:23 -------------------------------------------------------------------------------- Clinic Level of Care Assessment Details Patient Name: Date of Service: SHAYLIN, BLATT 01/11/2020 2:15 PM Medical Record [id number] Patient Account Number: [id number] Date of Birth/Sex: Treating RN: [birth year] (60 y.o. [name] Primary Care Ryan Ogborn: Delia Chimes Other Clinician: Referring Kaleeah Gingerich: Treating Donald Jacque/Extender:Robson, Shirley Muscat, ZOE Weeks in Treatment: 152 Clinic Level of Care Assessment Items TOOL 4 Quantity Score X - Use when only an EandM is performed  on FOLLOW-UP visit 1 0 ASSESSMENTS - Nursing Assessment / Reassessment X - Reassessment of Co-morbidities (includes updates in patient status) 1 10 X - Reassessment of Adherence to Treatment Plan 1 5 ASSESSMENTS - Wound and Skin Assessment / Reassessment X - Simple Wound Assessment / Reassessment - one wound 1 5 '[]'$  - Complex Wound Assessment / Reassessment - multiple wounds 0 '[]'$  - Dermatologic / Skin Assessment (not related to wound area) 0 ASSESSMENTS - Focused Assessment '[]'$  - Circumferential Edema Measurements - multi extremities 0 '[]'$  - Nutritional Assessment / Counseling / Intervention 0 '[]'$  - Lower Extremity Assessment (monofilament, tuning fork, pulses) 0 '[]'$  - Peripheral Arterial Disease Assessment (using hand held doppler) 0 ASSESSMENTS - Ostomy and/or Continence Assessment and Care '[]'$  - Incontinence Assessment and Management 0 '[]'$  - Ostomy Care Assessment and Management (repouching, etc.) 0 PROCESS - Coordination of Care '[]'$  - Simple Patient / Family Education for ongoing care 0 X - Complex (extensive) Patient / Family Education for ongoing care 1 20 X - Staff obtains Programmer, systems, Records, Test Results / Process Orders 1 10 '[]'$  - Staff telephones HHA, Nursing Homes / Clarify orders / etc 0 '[]'$  - Routine Transfer to another Facility (non-emergent condition) 0 '[]'$  - Routine Hospital Admission (non-emergent condition) 0 '[]'$  - New Admissions / Biomedical engineer / Ordering NPWT, Apligraf, etc. 0 '[]'$  - Emergency Hospital Admission (emergent condition) 0 X - Simple Discharge Coordination 1 10 '[]'$  - Complex (extensive) Discharge Coordination 0 PROCESS - Special Needs '[]'$  - Pediatric / Minor Patient Management 0 '[]'$  - Isolation Patient Management 0 '[]'$  - Hearing / Language / Visual special needs 0 '[]'$  - Assessment of Community assistance (transportation, D/C planning, etc.) 0 '[]'$  - Additional assistance / Altered mentation 0 X - Support Surface(s) Assessment (bed, cushion, seat, etc.) 1  15 INTERVENTIONS - Wound Cleansing / Measurement X - Simple Wound Cleansing - one wound 1 5 '[]'$  - Complex Wound Cleansing - multiple wounds 0 X - Wound Imaging (  photographs - any number of wounds) 1 5 '[]'$  - Wound Tracing (instead of photographs) 0 X - Simple Wound Measurement - one wound 1 5 '[]'$  - Complex Wound Measurement - multiple wounds 0 INTERVENTIONS - Wound Dressings X - Small Wound Dressing one or multiple wounds 1 10 '[]'$  - Medium Wound Dressing one or multiple wounds 0 '[]'$  - Large Wound Dressing one or multiple wounds 0 X - Application of Medications - topical 1 5 '[]'$  - Application of Medications - injection 0 INTERVENTIONS - Miscellaneous '[]'$  - External ear exam 0 '[]'$  - Specimen Collection (cultures, biopsies, blood, body fluids, etc.) 0 '[]'$  - Specimen(s) / Culture(s) sent or taken to Lab for analysis 0 X - Patient Transfer (multiple staff / Civil Service fast streamer / Similar devices) 1 10 '[]'$  - Simple Staple / Suture removal (25 or less) 0 '[]'$  - Complex Staple / Suture removal (26 or more) 0 '[]'$  - Hypo / Hyperglycemic Management (close monitor of Blood Glucose) 0 '[]'$  - Ankle / Brachial Index (ABI) - do not check if billed separately 0 X - Vital Signs 1 5 Has the patient been seen at the hospital within the last three years: Yes Total Score: 120 Level Of Care: New/Established - Level 4 Electronic Signature(s) Signed: 01/11/2020 5:49:59 PM By: Kela Millin Entered By: Kela Millin on 01/11/2020 15:20:11 -------------------------------------------------------------------------------- Encounter Discharge Information Details Patient Name: Date of Service: AMMIE, WARRICK 01/11/2020 2:15 PM Medical Record [id number] Patient Account Number: [id number] Date of Birth/Sex: Treating RN: 18-Apr-1961 (59 y.o. Debby Bud Primary Care Everson Mott: Delia Chimes Other Clinician: Referring Meliton Samad: Treating Acelin Ferdig/Extender:Robson, Shirley Muscat, ZOE Weeks in Treatment: 152 Encounter  Discharge Information Items Discharge Condition: Stable Ambulatory Status: Wheelchair Discharge Destination: Home Transportation: Private Auto Accompanied By: self Schedule Follow-up Appointment: Yes Clinical Summary of Care: Electronic Signature(s) Signed: 01/11/2020 5:54:34 PM By: Deon Pilling Entered By: Deon Pilling on 01/11/2020 15:57:40 -------------------------------------------------------------------------------- Multi Wound Chart Details Patient Name: Date of Service: JILLANN, CHARETTE 01/11/2020 2:15 PM Medical Record [id number] Patient Account Number: [id number] Date of Birth/Sex: Treating RN: 08-23-1961 (59 y.o. F) Primary Care Adrieana Fennelly: Delia Chimes Other Clinician: Referring Frimet Durfee: Treating Oreta Soloway/Extender:Robson, Shirley Muscat, ZOE Weeks in Treatment: 152 Vital Signs Height(in): 67 Pulse(bpm): 75 Weight(lbs): 220 Blood Pressure(mmHg): 148/71 Body Mass Index(BMI): 34 Temperature(F): 98.5 Respiratory 18 Rate(breaths/min): Photos: [1:No Photos] [N/A:N/A] Wound Location: [1:Right Ischium] [N/A:N/A] Wounding Event: [1:Pressure Injury] [N/A:N/A] Primary Etiology: [1:Pressure Ulcer] [N/A:N/A] Comorbid History: [1:Hypertension, Type II Diabetes, Osteoarthritis, Neuropathy] [N/A:N/A] Date Acquired: [1:12/06/2005] [N/A:N/A] Weeks of Treatment: [1:152] [N/A:N/A] Wound Status: [1:Open] [N/A:N/A] Clustered Wound: [1:Yes] [N/A:N/A] Clustered Quantity: [1:5] [N/A:N/A] Measurements L x W x D [1:6.5x6.8x0.2] [N/A:N/A] (cm) Area (cm) : [1:34.715] [N/A:N/A] Volume (cm) : [1:6.943] [N/A:N/A] % Reduction in Area: [1:-84.20%] [N/A:N/A] % Reduction in Volume: [1:-22.80%] [N/A:N/A] Classification: [1:Category/Stage III] [N/A:N/A] Exudate Amount: [1:Medium] [N/A:N/A] Exudate Type: [1:Serosanguineous] [N/A:N/A] Exudate Color: [1:red, brown] [N/A:N/A] Wound Margin: [1:Flat and Intact] [N/A:N/A] Granulation Amount: [1:Medium (34-66%)] [N/A:N/A] Granulation  Quality: [1:Red] [N/A:N/A] Necrotic Amount: [1:Medium (34-66%)] [N/A:N/A] Exposed Structures: [1:Fat Layer (Subcutaneous Tissue) Exposed: Yes Fascia: No Tendon: No Muscle: No Joint: No Bone: No] [N/A:N/A] Epithelialization: [1:None blister noted.] [N/A:N/A N/A] Treatment Notes Wound #1 (Right Ischium) 1. Cleanse With Wound Cleanser 2. Periwound Care Skin Prep 3. Primary Dressing Applied Calcium Alginate Ag 4. Secondary Dressing ABD Pad Dry Gauze 5. Secured With [employer]) Signed: 01/11/2020 5:45:44 PM By: Linton Ham MD Entered By: Linton Ham on 01/11/2020 16:53:54 -------------------------------------------------------------------------------- Multi-Disciplinary Care Plan Details Patient Name: Date of Service: Robin Searing  01/11/2020 2:15 PM Medical Record [id number] Patient Account Number: [id number] Date of Birth/Sex: Treating RN: [birth year] (59 y.o. [name] Primary Care Karielle Davidow: Delia Chimes Other Clinician: Referring Canon Gola: Treating Lysa Livengood/Extender:Robson, Shirley Muscat, ZOE Weeks in Treatment: 152 Active Inactive Pressure Nursing Diagnoses: Knowledge deficit related to causes and risk factors for pressure ulcer development Goals: Patient will remain free from development of additional pressure ulcers Date Initiated: 10/27/2018 Date Inactivated: 12/05/2019 Target Resolution Date: 11/23/2019 Goal Status: Met Patient/caregiver will verbalize risk factors for pressure ulcer development Date Inactivated: 10/27/2018 Target Resolution Date Initiated: 02/11/2017 Date: 11/29/2018 Goal Status: Met Patient/caregiver will verbalize understanding of pressure ulcer management Date Initiated: 12/05/2019 Target Resolution Date: 02/01/2020 Goal Status: Active Interventions: Assess: immobility, friction, shearing, incontinence upon admission and as needed Notes: Wound/Skin Impairment Nursing Diagnoses: Impaired  tissue integrity Knowledge deficit related to ulceration/compromised skin integrity Goals: Patient/caregiver will verbalize understanding of skin care regimen Date Initiated: 11/11/2017 Target Resolution Date: 02/01/2020 Goal Status: Active Interventions: Assess patient/caregiver ability to obtain necessary supplies Assess patient/caregiver ability to perform ulcer/skin care regimen upon admission and as needed Assess ulceration(s) every visit Treatment Activities: Patient referred to home care : 11/11/2017 Skin care regimen initiated : 11/11/2017 Topical wound management initiated : 11/11/2017 Notes: Electronic Signature(s) Signed: 01/11/2020 5:49:59 PM By: Kela Millin Entered By: Kela Millin on 01/11/2020 14:09:30 -------------------------------------------------------------------------------- Pain Assessment Details Patient Name: Date of Service: JOCHEBED, BILLS 01/11/2020 2:15 PM Medical Record [id number] Patient Account Number: [id number] Date of Birth/Sex: Treating RN: 1961/04/19 (59 y.o. Nancy Fetter Primary Care Kelen Laura: Delia Chimes Other Clinician: Referring Whitten Andreoni: Treating Eliyana Pagliaro/Extender:Robson, Shirley Muscat, ZOE Weeks in Treatment: 152 Active Problems Location of Pain Severity and Description of Pain Patient Has Paino Yes Site Locations Pain Location: Pain in Ulcers With Dressing Change: Yes Rate the pain. Current Pain Level: 3 Character of Pain Describe the Pain: Throbbing Pain Management and Medication Current Pain Management: Medication: Yes Cold Application: No Rest: No Massage: No Activity: No T.E.N.S.: No Heat Application: No Leg drop or elevation: No Is the Current Pain Management Adequate: Adequate How does your wound impact your activities of daily livingo Sleep: No Bathing: No Appetite: No Relationship With Others: No Bladder Continence: No Emotions: No Bowel Continence: No Work: No Toileting: No Drive:  No Dressing: No Hobbies: No Electronic Signature(s) Signed: 01/15/2020 5:30:42 PM By: Levan Hurst RN, BSN Entered By: Levan Hurst on 01/11/2020 14:19:16 -------------------------------------------------------------------------------- Patient/Caregiver Education Details Patient Name: Date of Service: Robin Searing 2/5/2021andnbsp2:15 PM Medical Record [id number] Patient Account Number: [id number] Date of Birth/Gender: Treating RN: Sep 11, 1961 (59 y.o. [name] Primary Care Physician: Delia Chimes Other Clinician: Referring Physician: Treating Physician/Extender:Robson, Shirley Muscat, ZOE Weeks in Treatment: 152 Education Assessment Education Provided To: Patient Education Topics Provided Wound/Skin Impairment: Methods: Explain/Verbal Responses: State content correctly Electronic Signature(s) Signed: 01/11/2020 5:49:59 PM By: Kela Millin Entered By: Kela Millin on 01/11/2020 14:10:01 -------------------------------------------------------------------------------- Wound Assessment Details Patient Name: Date of Service: GENIVA, LOHNES 01/11/2020 2:15 PM Medical Record [id number] Patient Account Number: [id number] Date of Birth/Sex: Treating RN: 1961-09-13 (59 y.o. F) Primary Care Lauriana Denes: Delia Chimes Other Clinician: Referring Journee Bobrowski: Treating Female Iafrate/Extender:Robson, Shirley Muscat, ZOE Weeks in Treatment: 152 Wound Status Wound Number: 1 Primary Pressure Ulcer Etiology: Wound Location: Right Ischium Wound Open Wounding Event: Pressure Injury Status: Date Acquired: 12/06/2005 Comorbid Hypertension, Type II Diabetes, Weeks Of Treatment: 152 History: Osteoarthritis, Neuropathy Clustered Wound: Yes Photos Wound Measurements Length: (cm) 6.5 Width: (cm) 6.8 Depth: (cm) 0.2 Clustered Quantity: 5 Area: (cm) 34.715 Volume: (cm) 6.943 Wound  Description Classification: Category/Stage III Wound Margin: Flat and  Intact Exudate Amount: Medium Exudate Type: Serosanguineous Exudate Color: red, brown Wound Bed Granulation Amount: Medium (34-66%) Granulation Quality: Red Necrotic Amount: Medium (34-66%) Necrotic Quality: Adherent Slough fter Cleansing: No ino Yes Exposed Structure ed: No ubcutaneous Tissue) Exposed: Yes ed: No ed: No d: No : No % Reduction in Area: -84.2% % Reduction in Volume: -22.8% Epithelialization: None Tunneling: No Undermining: No Foul Odor A Slough/Fibr Fascia Expos Fat Layer (S Tendon Expos Muscle Expos Joint Expose Bone Exposed Assessment Notes blister noted. Treatment Notes Wound #1 (Right Ischium) 1. Cleanse With Wound Cleanser 2. Periwound Care Skin Prep 3. Primary Dressing Applied Calcium Alginate Ag 4. Secondary Dressing ABD Pad Dry Gauze 5. Secured With [employer]) Signed: 01/15/2020 4:28:10 PM By: Mikeal Hawthorne EMT/HBOT Entered By: Mikeal Hawthorne on 01/15/2020 13:06:45 -------------------------------------------------------------------------------- Vitals Details Patient Name: Date of Service: CHONITA, GADEA 01/11/2020 2:15 PM Medical Record [id number] Patient Account Number: [id number] Date of Birth/Sex: Treating RN: 09-Apr-1961 (59 y.o. Nancy Fetter Primary Care Orlandria Kissner: Delia Chimes Other Clinician: Referring Stephani Janak: Treating Ronelle Michie/Extender:Robson, Shirley Muscat, ZOE Weeks in Treatment: 152 Vital Signs Time Taken: 14:18 Temperature (F): 98.5 Height (in): 67 Pulse (bpm): 75 Weight (lbs): 220 Respiratory Rate (breaths/min): 18 Body Mass Index (BMI): 34.5 Blood Pressure (mmHg): 148/71 Reference Range: 80 - 120 mg / dl Electronic Signature(s) Signed: 01/15/2020 5:30:42 PM By: Levan Hurst RN, BSN Entered By: Levan Hurst on 01/11/2020 14:18:44

## 2020-01-16 ENCOUNTER — Encounter (HOSPITAL_COMMUNITY)
Admission: RE | Admit: 2020-01-16 | Discharge: 2020-01-16 | Disposition: A | Discharge status: Home / Self Care | Payer: Medicare Other | Source: Ambulatory Visit | Attending: Urology | Admitting: Urology

## 2020-01-16 ENCOUNTER — Encounter (HOSPITAL_COMMUNITY): Payer: Self-pay | Admitting: *Deleted

## 2020-01-16 ENCOUNTER — Other Ambulatory Visit: Payer: Self-pay

## 2020-01-16 DIAGNOSIS — M1990 Unspecified osteoarthritis, unspecified site: Secondary | ICD-10-CM | POA: Diagnosis not present

## 2020-01-16 DIAGNOSIS — G35 Multiple sclerosis: Secondary | ICD-10-CM | POA: Insufficient documentation

## 2020-01-16 DIAGNOSIS — I10 Essential (primary) hypertension: Secondary | ICD-10-CM | POA: Diagnosis not present

## 2020-01-16 DIAGNOSIS — Z8542 Personal history of malignant neoplasm of other parts of uterus: Secondary | ICD-10-CM | POA: Diagnosis not present

## 2020-01-16 DIAGNOSIS — E782 Mixed hyperlipidemia: Secondary | ICD-10-CM | POA: Diagnosis not present

## 2020-01-16 DIAGNOSIS — Z79899 Other long term (current) drug therapy: Secondary | ICD-10-CM | POA: Diagnosis not present

## 2020-01-16 DIAGNOSIS — Z7984 Long term (current) use of oral hypoglycemic drugs: Secondary | ICD-10-CM | POA: Diagnosis not present

## 2020-01-16 DIAGNOSIS — Z01818 Encounter for other preprocedural examination: Secondary | ICD-10-CM | POA: Diagnosis not present

## 2020-01-16 DIAGNOSIS — N319 Neuromuscular dysfunction of bladder, unspecified: Secondary | ICD-10-CM | POA: Diagnosis not present

## 2020-01-16 DIAGNOSIS — R9431 Abnormal electrocardiogram [ECG] [EKG]: Secondary | ICD-10-CM | POA: Diagnosis not present

## 2020-01-16 DIAGNOSIS — Z6841 Body Mass Index (BMI) 40.0 and over, adult: Secondary | ICD-10-CM | POA: Insufficient documentation

## 2020-01-16 DIAGNOSIS — N39498 Other specified urinary incontinence: Secondary | ICD-10-CM | POA: Diagnosis not present

## 2020-01-16 DIAGNOSIS — E1142 Type 2 diabetes mellitus with diabetic polyneuropathy: Secondary | ICD-10-CM | POA: Insufficient documentation

## 2020-01-16 DIAGNOSIS — Z90710 Acquired absence of both cervix and uterus: Secondary | ICD-10-CM | POA: Insufficient documentation

## 2020-01-16 DIAGNOSIS — Z9851 Tubal ligation status: Secondary | ICD-10-CM | POA: Diagnosis not present

## 2020-01-16 HISTORY — DX: Gastro-esophageal reflux disease without esophagitis: K21.9

## 2020-01-16 NOTE — Progress Notes (Signed)
PCP - Dr. Delia Chimes   LOV-01/09/2020 epic Cardiologist -[place]  Neurology- Dr. Arlice Colt  [place] 12/19/2019  Epic   Chest x-ray - n/a EKG - 01/17/2020 epic Stress Test - n/a ECHO - n/a Cardiac Cath - n/a  Sleep Study - n/a CPAP - n/a  Fasting Blood Sugar - 140 Checks Blood Sugar __1___ times a day  Blood Thinner Instructions:n/a Aspirin Instructions:n/a Last Dose:n/a  Anesthesia review:   Patient has a history of Multiple Sclerosis, HTN, DM Type 2, diplegia of both lower extremities, and weakness right side intermittently.  Patient denies shortness of breath, fever, cough and chest pain at PAT appointment   Patient verbalized understanding of instructions that were given to them at the PAT appointment. Patient was also instructed that they will need to review over the PAT instructions again at home before surgery.

## 2020-01-17 ENCOUNTER — Encounter (HOSPITAL_COMMUNITY)
Admission: RE | Admit: 2020-01-17 | Discharge: 2020-01-17 | Disposition: A | Discharge status: Home / Self Care | Payer: Medicare Other | Source: Ambulatory Visit | Attending: Urology | Admitting: Urology

## 2020-01-17 DIAGNOSIS — Z6841 Body Mass Index (BMI) 40.0 and over, adult: Secondary | ICD-10-CM | POA: Diagnosis not present

## 2020-01-17 DIAGNOSIS — I10 Essential (primary) hypertension: Secondary | ICD-10-CM | POA: Diagnosis not present

## 2020-01-17 DIAGNOSIS — N319 Neuromuscular dysfunction of bladder, unspecified: Secondary | ICD-10-CM | POA: Diagnosis not present

## 2020-01-17 DIAGNOSIS — Z01818 Encounter for other preprocedural examination: Secondary | ICD-10-CM | POA: Diagnosis not present

## 2020-01-17 DIAGNOSIS — N39498 Other specified urinary incontinence: Secondary | ICD-10-CM | POA: Diagnosis not present

## 2020-01-17 LAB — CBC
HCT: 34.3 % — ABNORMAL LOW (ref 36.0–46.0)
Hemoglobin: 10.2 g/dL — ABNORMAL LOW (ref 12.0–15.0)
MCH: 25.5 pg — ABNORMAL LOW (ref 26.0–34.0)
MCHC: 29.7 g/dL — ABNORMAL LOW (ref 30.0–36.0)
MCV: 85.8 fL (ref 80.0–100.0)
Platelets: 446 10*3/uL — ABNORMAL HIGH (ref 150–400)
RBC: 4 MIL/uL (ref 3.87–5.11)
RDW: 14.7 % (ref 11.5–15.5)
WBC: 6.7 10*3/uL (ref 4.0–10.5)
nRBC: 0 % (ref 0.0–0.2)

## 2020-01-17 LAB — BASIC METABOLIC PANEL
Anion gap: 9 (ref 5–15)
BUN: 13 mg/dL (ref 6–20)
CO2: 30 mmol/L (ref 22–32)
Calcium: 9 mg/dL (ref 8.9–10.3)
Chloride: 100 mmol/L (ref 98–111)
Creatinine, Ser: 0.82 mg/dL (ref 0.44–1.00)
GFR calc Af Amer: >60 mL/min (ref >60)
GFR calc non Af Amer: >60 mL/min (ref >60)
Glucose, Bld: 159 mg/dL — ABNORMAL HIGH (ref 70–99)
Potassium: 3.4 mmol/L — ABNORMAL LOW (ref 3.5–5.1)
Sodium: 139 mmol/L (ref 135–145)

## 2020-01-17 LAB — GLUCOSE, CAPILLARY: Glucose-Capillary: 143 mg/dL — ABNORMAL HIGH (ref 70–99)

## 2020-01-18 ENCOUNTER — Inpatient Hospital Stay (HOSPITAL_COMMUNITY): Admission: RE | Admit: 2020-01-18 | Payer: Medicare Other | Source: Ambulatory Visit

## 2020-01-18 ENCOUNTER — Other Ambulatory Visit: Payer: Self-pay | Admitting: Urology

## 2020-01-18 DIAGNOSIS — I10 Essential (primary) hypertension: Secondary | ICD-10-CM | POA: Diagnosis not present

## 2020-01-18 DIAGNOSIS — E782 Mixed hyperlipidemia: Secondary | ICD-10-CM | POA: Diagnosis not present

## 2020-01-18 DIAGNOSIS — E1142 Type 2 diabetes mellitus with diabetic polyneuropathy: Secondary | ICD-10-CM | POA: Diagnosis not present

## 2020-01-18 NOTE — Progress Notes (Signed)
Anesthesia Chart Review   Case: [id number] Date/Time: 01/22/20 1245   Procedure: CYSTOSCOPY WITH SUPRA PUBIC TUBE CHANGE, BOTOX 200 UNITS, RETROGRADE PYELOGRAM (N/A )   Anesthesia type: General   Pre-op diagnosis: NEUROGENIC BLADDER, INCONTINENCE   Location: [place] [facility]   Surgeons: Festus Aloe, MD      DISCUSSION:59 y.o. never smoker with h/o HTN, HLD, GERD, DM II, MS followed by neurology, morbid obesity, neurogenic bladder, incontinence scheduled for above procedure 01/22/20 with Dr. Festus Aloe.   Last seen by neurologist 12/19/19.  At this visit she reported that her MS is stable, using a wheelchair exclusively, using a bedpan.  Complains of lower extremity weakness and spasticity.  Continue Betaseron.  1 year follow up recommended.   Per last PCP note pt has stage 4 decubitus ulcers.   S/p hysterectomy 12/19/2018 with no anesthesia complications.   Anticipate pt can proceed with planned procedure barring acute status change.   VS: BP (!) 146/85 (BP Location: Right Arm) Comment (BP Location): lower arm  Pulse 73   Temp 37.1 C (Oral)   Resp 18   Ht '5\' 7"'$  (1.702 m)   Wt 136.1 kg   SpO2 98%   BMI 46.99 kg/m   PROVIDERS: Forrest Moron, MD is PCP last seen 01/09/2020  Arlice Colt, MD is Neurologist  LABS: Labs reviewed: Acceptable for surgery. (all labs ordered are listed, but only abnormal results are displayed)  Labs Reviewed  BASIC METABOLIC PANEL - Abnormal; Notable for the following components:      Result Value   Potassium 3.4 (*)    Glucose, Bld 159 (*)    All other components within normal limits  CBC - Abnormal; Notable for the following components:   Hemoglobin 10.2 (*)    HCT 34.3 (*)    MCH 25.5 (*)    MCHC 29.7 (*)    Platelets 446 (*)    All other components within normal limits  GLUCOSE, CAPILLARY - Abnormal; Notable for the following components:   Glucose-Capillary 143 (*)    All other components within normal limits      IMAGES:   EKG: 01/17/2020 Rate 66 bpm Normal sinus rhythm Minimal voltage criteria for LVH, may be normal variant Abnormal ECG No significant change since last tracing  CV:  Past Medical History:  Diagnosis Date  . Anemia    history of  . Arthritis   . Decubitus ulcers    currently treated at wound center  . Diabetes mellitus without complication ([place])   . Diabetic polyneuropathy ([place])   . Endometrial cancer (H[facility])    Grade 2  . Fibroids   . GERD (gastroesophageal reflux disease)   . Heartburn    Occ  . Hypertension   . Mixed hyperlipidemia   . Morbid obesity ([place])   . MS (multiple sclerosis) ([place])     Past Surgical History:  Procedure Laterality Date  . COLONOSCOPY    . ESOPHAGOGASTRODUODENOSCOPY N/A 12/02/2017   Procedure: ESOPHAGOGASTRODUODENOSCOPY (EGD);  Surgeon: Ronnette Juniper, MD;  Location: [facility];  Service: Gastroenterology;  Laterality: N/A;  . ROBOTIC ASSISTED TOTAL HYSTERECTOMY WITH BILATERAL SALPINGO OOPHERECTOMY Bilateral 12/19/2018   Procedure: XI ROBOTIC ASSISTED TOTAL HYSTERECTOMY (UTERUS GREATER THAN 250gr) WITH BILATERAL SALPINGO OOPHORECTOMY;  Surgeon: Everitt Amber, MD;  [facility];  Service: Gynecology;  Laterality: Bilateral;  . SENTINEL NODE BIOPSY N/A 12/19/2018   Procedure: SENTINEL NODE BIOPSY;  Surgeon: Everitt Amber, MD;  Location[facility];  Service: Gynecology;  Laterality: N/A;  . SUPRAPUBIC CATHETER INSERTION  2009  . TONSILLECTOMY    . TUBAL LIGATION      MEDICATIONS: . atorvastatin (LIPITOR) 10 MG tablet  . baclofen (LIORESAL) 20 MG tablet  . Catheters (DOVER UNIVERSAL FOLEY TRAY) KIT  . Catheters (FOLEY CATHETER 2-WAY) MISC  . Cholecalciferol (D3-1000) 1000 units capsule  . furosemide (LASIX) 20 MG tablet  . glipiZIDE (GLUCOTROL) 5 MG tablet  . hydrochlorothiazide (HYDRODIURIL) 25 MG tablet  . hydrocortisone (ANUSOL-HC) 2.5 % rectal cream  . hydrocortisone (ANUSOL-HC) 25 MG suppository  . Incontinence Supply  Disposable (TENA FLEX 16 PLUS) MISC  . Incontinence Supply Disposable (TENA PROTECT UNDERWEAR PLS/XLG) MISC  . Interferon Beta-1b (BETASERON) 0.3 MG KIT injection  . metFORMIN (GLUCOPHAGE) 500 MG tablet  . MOVANTIK 25 MG TABS tablet  . NON FORMULARY  . NON FORMULARY  . nystatin ointment (MYCOSTATIN)  . Ostomy Supplies (NEW IMAGE SKIN/FLANGE/TAPE) MISC  . oxyCODONE-acetaminophen (PERCOCET) 10-325 MG tablet  . potassium chloride (MICRO-K) 10 MEQ CR capsule  . promethazine (PHENERGAN) 25 MG tablet  . senna-docusate (SENOKOT-S) 8.6-50 MG tablet  . Syringe, Disposable, (B-D SYRINGE LUER-LOK 30CC) 30 ML MISC  . vitamin B-12 (CYANOCOBALAMIN) 500 MCG tablet  . Vitamin D, Ergocalciferol, (DRISDOL) 1.25 MG (50000 UNIT) CAPS capsule   No current facility-administered medications for this encounter.   . botulinum toxin Type A (BOTOX) injection 200 Units     Maia Plan [facility] Pre-Surgical Testing [phone] 01/18/20  12:22 PM

## 2020-01-21 ENCOUNTER — Telehealth: Payer: Self-pay | Admitting: Family Medicine

## 2020-01-21 MED ORDER — DEXTROSE 5 % IV SOLN
3.0000 g | INTRAVENOUS | Status: AC
  Administered 2020-01-22: 3 g via INTRAVENOUS
  Filled 2020-01-21: qty 3

## 2020-01-21 NOTE — Telephone Encounter (Signed)
--  Spoke with Ladonna Snide (son) and has concerns as to why his mom has been with out proper bed for over a year.  He advises his mother is having a procedure tomorrow and will have to go home to a hard bed.  Advised it has been a long process trying to get the proper bed for pt.  Advised Dr. Nolon Rod has done all she can as a provider to help facilitate the need of the pt but there is nothing more she can.  I advised I understood his frustration but insurance has their on specific guidelines pt must meet.  I advised I was not sure of all the details with this but would let Dr. Nolon Rod know of his concerns and have her contact him by phone to give him a full rundown the pt's request for hospital bed.  Mr. Blankenship is agreeable.  Spoke with Dr. Nolon Rod via phone about son's concerns and she will follow up with him by phone on [place].  Ron [phone]

## 2020-01-21 NOTE — Telephone Encounter (Signed)
Patient Son calling to speak to provider, says that it is concerning his mother's health, did not want to disclose  Requested a call back ASAP   Please advise

## 2020-01-21 NOTE — Telephone Encounter (Signed)
Please Advise

## 2020-01-22 ENCOUNTER — Ambulatory Visit (HOSPITAL_COMMUNITY)
Admission: RE | Admit: 2020-01-22 | Discharge: 2020-01-22 | Disposition: A | Discharge status: Home / Self Care | Payer: Medicare Other | Source: Other Acute Inpatient Hospital | Attending: Urology | Admitting: Urology

## 2020-01-22 ENCOUNTER — Telehealth: Payer: Self-pay | Admitting: *Deleted

## 2020-01-22 ENCOUNTER — Ambulatory Visit (HOSPITAL_COMMUNITY): Payer: Medicare Other | Admitting: Anesthesiology

## 2020-01-22 ENCOUNTER — Encounter (HOSPITAL_COMMUNITY): Payer: Self-pay | Admitting: Urology

## 2020-01-22 ENCOUNTER — Ambulatory Visit (HOSPITAL_COMMUNITY): Payer: Medicare Other

## 2020-01-22 ENCOUNTER — Ambulatory Visit (HOSPITAL_COMMUNITY): Payer: Medicare Other | Admitting: Physician Assistant

## 2020-01-22 ENCOUNTER — Encounter (HOSPITAL_COMMUNITY)
Admission: RE | Disposition: A | Discharge status: Home / Self Care | Payer: Self-pay | Source: Other Acute Inpatient Hospital | Attending: Urology

## 2020-01-22 DIAGNOSIS — Z8542 Personal history of malignant neoplasm of other parts of uterus: Secondary | ICD-10-CM | POA: Diagnosis not present

## 2020-01-22 DIAGNOSIS — Z20822 Contact with and (suspected) exposure to covid-19: Secondary | ICD-10-CM | POA: Diagnosis not present

## 2020-01-22 DIAGNOSIS — N3289 Other specified disorders of bladder: Secondary | ICD-10-CM | POA: Insufficient documentation

## 2020-01-22 DIAGNOSIS — Z6841 Body Mass Index (BMI) 40.0 and over, adult: Secondary | ICD-10-CM | POA: Diagnosis not present

## 2020-01-22 DIAGNOSIS — Z79899 Other long term (current) drug therapy: Secondary | ICD-10-CM | POA: Diagnosis not present

## 2020-01-22 DIAGNOSIS — R32 Unspecified urinary incontinence: Secondary | ICD-10-CM | POA: Diagnosis not present

## 2020-01-22 DIAGNOSIS — N3946 Mixed incontinence: Secondary | ICD-10-CM

## 2020-01-22 DIAGNOSIS — Z7984 Long term (current) use of oral hypoglycemic drugs: Secondary | ICD-10-CM | POA: Diagnosis not present

## 2020-01-22 DIAGNOSIS — K219 Gastro-esophageal reflux disease without esophagitis: Secondary | ICD-10-CM | POA: Insufficient documentation

## 2020-01-22 DIAGNOSIS — E119 Type 2 diabetes mellitus without complications: Secondary | ICD-10-CM | POA: Diagnosis not present

## 2020-01-22 DIAGNOSIS — N318 Other neuromuscular dysfunction of bladder: Secondary | ICD-10-CM | POA: Diagnosis not present

## 2020-01-22 DIAGNOSIS — G35 Multiple sclerosis: Secondary | ICD-10-CM | POA: Diagnosis not present

## 2020-01-22 DIAGNOSIS — N3080 Other cystitis without hematuria: Secondary | ICD-10-CM | POA: Diagnosis not present

## 2020-01-22 DIAGNOSIS — I10 Essential (primary) hypertension: Secondary | ICD-10-CM | POA: Insufficient documentation

## 2020-01-22 DIAGNOSIS — N3001 Acute cystitis with hematuria: Secondary | ICD-10-CM | POA: Diagnosis not present

## 2020-01-22 DIAGNOSIS — E1142 Type 2 diabetes mellitus with diabetic polyneuropathy: Secondary | ICD-10-CM | POA: Insufficient documentation

## 2020-01-22 DIAGNOSIS — N319 Neuromuscular dysfunction of bladder, unspecified: Secondary | ICD-10-CM | POA: Diagnosis not present

## 2020-01-22 DIAGNOSIS — N3941 Urge incontinence: Secondary | ICD-10-CM | POA: Diagnosis not present

## 2020-01-22 DIAGNOSIS — E782 Mixed hyperlipidemia: Secondary | ICD-10-CM | POA: Diagnosis not present

## 2020-01-22 DIAGNOSIS — I781 Nevus, non-neoplastic: Secondary | ICD-10-CM | POA: Diagnosis not present

## 2020-01-22 DIAGNOSIS — N3021 Other chronic cystitis with hematuria: Secondary | ICD-10-CM | POA: Diagnosis not present

## 2020-01-22 HISTORY — PX: CYSTOSCOPY: SHX5120

## 2020-01-22 LAB — RESPIRATORY PANEL BY RT PCR (FLU A&B, COVID)
Influenza A by PCR: NEGATIVE
Influenza B by PCR: NEGATIVE
SARS Coronavirus 2 by RT PCR: NEGATIVE

## 2020-01-22 LAB — GLUCOSE, CAPILLARY
Glucose-Capillary: 153 mg/dL — ABNORMAL HIGH (ref 70–99)
Glucose-Capillary: 174 mg/dL — ABNORMAL HIGH (ref 70–99)

## 2020-01-22 SURGERY — CYSTOSCOPY
Anesthesia: General

## 2020-01-22 MED ORDER — ONABOTULINUMTOXINA 100 UNITS IJ SOLR
INTRAMUSCULAR | Status: DC | PRN
  Administered 2020-01-22: 200 [IU] via INTRAMUSCULAR

## 2020-01-22 MED ORDER — SCOPOLAMINE 1 MG/3DAYS TD PT72
MEDICATED_PATCH | TRANSDERMAL | Status: AC
  Administered 2020-01-22: 1.5 mg via TRANSDERMAL
  Filled 2020-01-22: qty 1

## 2020-01-22 MED ORDER — LIDOCAINE 2% (20 MG/ML) 5 ML SYRINGE
INTRAMUSCULAR | Status: AC
  Filled 2020-01-22: qty 5

## 2020-01-22 MED ORDER — SODIUM CHLORIDE (PF) 0.9 % IJ SOLN
INTRAMUSCULAR | Status: AC
  Filled 2020-01-22: qty 20

## 2020-01-22 MED ORDER — FENTANYL CITRATE (PF) 100 MCG/2ML IJ SOLN: 25.0000 ug | INTRAMUSCULAR | Status: DC | PRN

## 2020-01-22 MED ORDER — DEXAMETHASONE SODIUM PHOSPHATE 10 MG/ML IJ SOLN
INTRAMUSCULAR | Status: AC
  Filled 2020-01-22: qty 1

## 2020-01-22 MED ORDER — MIDAZOLAM HCL 5 MG/5ML IJ SOLN
INTRAMUSCULAR | Status: DC | PRN
  Administered 2020-01-22: 2 mg via INTRAVENOUS

## 2020-01-22 MED ORDER — 0.9 % SODIUM CHLORIDE (POUR BTL) OPTIME
TOPICAL | Status: DC | PRN
  Administered 2020-01-22: 1000 mL

## 2020-01-22 MED ORDER — FENTANYL CITRATE (PF) 100 MCG/2ML IJ SOLN
INTRAMUSCULAR | Status: DC | PRN
  Administered 2020-01-22 (×2): 50 ug via INTRAVENOUS

## 2020-01-22 MED ORDER — ONDANSETRON HCL 4 MG/2ML IJ SOLN
INTRAMUSCULAR | Status: DC | PRN
  Administered 2020-01-22: 4 mg via INTRAVENOUS

## 2020-01-22 MED ORDER — LACTATED RINGERS IV SOLN: INTRAVENOUS | Status: DC

## 2020-01-22 MED ORDER — MIDAZOLAM HCL 2 MG/2ML IJ SOLN
INTRAMUSCULAR | Status: AC
  Filled 2020-01-22: qty 2

## 2020-01-22 MED ORDER — DEXAMETHASONE SODIUM PHOSPHATE 10 MG/ML IJ SOLN
INTRAMUSCULAR | Status: DC | PRN
  Administered 2020-01-22: 10 mg via INTRAVENOUS

## 2020-01-22 MED ORDER — GLYCOPYRROLATE PF 0.2 MG/ML IJ SOSY
PREFILLED_SYRINGE | INTRAMUSCULAR | Status: DC | PRN
  Administered 2020-01-22: .1 mg via INTRAVENOUS

## 2020-01-22 MED ORDER — LIDOCAINE 2% (20 MG/ML) 5 ML SYRINGE
INTRAMUSCULAR | Status: DC | PRN
  Administered 2020-01-22: 100 mg via INTRAVENOUS

## 2020-01-22 MED ORDER — MEPERIDINE HCL 50 MG/ML IJ SOLN: 6.2500 mg | INTRAMUSCULAR | Status: DC | PRN

## 2020-01-22 MED ORDER — EPHEDRINE SULFATE-NACL 50-0.9 MG/10ML-% IV SOSY
PREFILLED_SYRINGE | INTRAVENOUS | Status: DC | PRN
  Administered 2020-01-22: 10 mg via INTRAVENOUS

## 2020-01-22 MED ORDER — FENTANYL CITRATE (PF) 100 MCG/2ML IJ SOLN
INTRAMUSCULAR | Status: AC
  Filled 2020-01-22: qty 2

## 2020-01-22 MED ORDER — IOHEXOL 300 MG/ML  SOLN
INTRAMUSCULAR | Status: DC | PRN
  Administered 2020-01-22: 10 mL

## 2020-01-22 MED ORDER — SCOPOLAMINE 1 MG/3DAYS TD PT72: 1.0000 | MEDICATED_PATCH | TRANSDERMAL | Status: DC

## 2020-01-22 MED ORDER — ONDANSETRON HCL 4 MG/2ML IJ SOLN
INTRAMUSCULAR | Status: AC
  Filled 2020-01-22: qty 2

## 2020-01-22 MED ORDER — PROPOFOL 10 MG/ML IV BOLUS
INTRAVENOUS | Status: DC | PRN
  Administered 2020-01-22: 200 mg via INTRAVENOUS

## 2020-01-22 MED ORDER — GLYCOPYRROLATE PF 0.2 MG/ML IJ SOSY
PREFILLED_SYRINGE | INTRAMUSCULAR | Status: AC
  Filled 2020-01-22: qty 1

## 2020-01-22 MED ORDER — SODIUM CHLORIDE 0.9 % IR SOLN
Status: DC | PRN
  Administered 2020-01-22: 3000 mL via INTRAVESICAL

## 2020-01-22 MED ORDER — ONABOTULINUMTOXINA 100 UNITS IJ SOLR
INTRAMUSCULAR | Status: AC
  Filled 2020-01-22: qty 200

## 2020-01-22 MED ORDER — ONDANSETRON HCL 4 MG/2ML IJ SOLN: 4.0000 mg | Freq: Once | INTRAMUSCULAR | Status: DC | PRN

## 2020-01-22 SURGICAL SUPPLY — 15 items
BAG URINE DRAIN 2000ML AR STRL (UROLOGICAL SUPPLIES) ×2 IMPLANT
BAG URO CATCHER STRL LF (MISCELLANEOUS) ×2 IMPLANT
BASKET ZERO TIP NITINOL 2.4FR (BASKET) IMPLANT
CATH INTERMIT  6FR 70CM (CATHETERS) ×2 IMPLANT
CLOTH BEACON ORANGE TIMEOUT ST (SAFETY) ×2 IMPLANT
GLOVE BIOGEL M STRL SZ7.5 (GLOVE) ×2 IMPLANT
GOWN STRL REUS W/TWL XL LVL3 (GOWN DISPOSABLE) ×2 IMPLANT
GUIDEWIRE ANG ZIPWIRE 038X150 (WIRE) IMPLANT
GUIDEWIRE SENSOR ANG DUAL FLEX (WIRE) ×2 IMPLANT
GUIDEWIRE STR DUAL SENSOR (WIRE) ×2 IMPLANT
KIT TURNOVER KIT A (KITS) IMPLANT
MANIFOLD NEPTUNE II (INSTRUMENTS) ×2 IMPLANT
PACK CYSTO (CUSTOM PROCEDURE TRAY) ×2 IMPLANT
TUBING CONNECTING 10 (TUBING) ×2 IMPLANT
TUBING UROLOGY SET (TUBING) ×2 IMPLANT

## 2020-01-22 NOTE — Anesthesia Preprocedure Evaluation (Signed)
Anesthesia Evaluation  Patient identified by MRN, date of birth, ID band Patient awake    Reviewed: Allergy & Precautions, NPO status , Patient's Chart, lab work & pertinent test results  Airway Mallampati: II  TM Distance: >3 FB Neck ROM: Full    Dental no notable dental hx. (+) Teeth Intact   Pulmonary neg pulmonary ROS,    Pulmonary exam normal breath sounds clear to auscultation       Cardiovascular hypertension, Pt. on medications Normal cardiovascular exam Rhythm:Regular Rate:Normal     Neuro/Psych Diabetic neuropathy Multiple sclerosis  Neuromuscular disease negative psych ROS   GI/Hepatic Neg liver ROS, GERD  Medicated,  Endo/Other  diabetes, Well Controlled, Type 2, Oral Hypoglycemic AgentsMorbid obesityHyperlipidemia  Renal/GU negative Renal ROS   Neurogenic blader Incontinence    Musculoskeletal  (+) Arthritis , Osteoarthritis,  Decubitus ulcers    Abdominal (+) + obese,   Peds  Hematology  (+) anemia ,   Anesthesia Other Findings   Reproductive/Obstetrics                             Anesthesia Physical Anesthesia Plan  ASA: III  Anesthesia Plan: General   Post-op Pain Management:    Induction: Intravenous  PONV Risk Score and Plan: 4 or greater and Ondansetron, Dexamethasone, Treatment may vary due to age or medical condition and Midazolam  Airway Management Planned: LMA  Additional Equipment:   Intra-op Plan:   Post-operative Plan: Extubation in OR  Informed Consent: I have reviewed the patients History and Physical, chart, labs and discussed the procedure including the risks, benefits and alternatives for the proposed anesthesia with the patient or authorized representative who has indicated his/her understanding and acceptance.     Dental advisory given  Plan Discussed with: CRNA and Surgeon  Anesthesia Plan Comments:         Anesthesia Quick  Evaluation

## 2020-01-22 NOTE — Anesthesia Postprocedure Evaluation (Signed)
Anesthesia Post Note  Patient: Ifeoma Midura  Procedure(s) Performed: CYSTOSCOPY WITH SUPRA PUBIC TUBE CHANGE, BOTOX 200 UNITS, RETROGRADE PYELOGRAM BILATERAL. FULGERATION .5 CM-2CM (N/A )     Patient location during evaluation: PACU Anesthesia Type: General Level of consciousness: awake and alert and oriented Pain management: pain level controlled Vital Signs Assessment: post-procedure vital signs reviewed and stable Respiratory status: spontaneous breathing, nonlabored ventilation and respiratory function stable Cardiovascular status: blood pressure returned to baseline and stable Postop Assessment: no apparent nausea or vomiting Anesthetic complications: no    Last Vitals:  Vitals:   01/22/20 1330 01/22/20 1400  BP: (!) 154/91   Pulse: 64   Resp: 12   Temp:  (!) 36.4 C  SpO2: 99%     Last Pain:  Vitals:   01/22/20 1400  TempSrc:   PainSc: 0-No pain                 Laurena Valko A.

## 2020-01-22 NOTE — Anesthesia Procedure Notes (Signed)
Procedure Name: Intubation Date/Time: 01/22/2020 12:06 PM Performed by: Mitzie Na, CRNA Pre-anesthesia Checklist: Patient identified, Emergency Drugs available, Suction available and Patient being monitored Patient Re-evaluated:Patient Re-evaluated prior to induction Oxygen Delivery Method: Circle system utilized Preoxygenation: Pre-oxygenation with 100% oxygen Induction Type: IV induction LMA: LMA inserted LMA Size: 4.0 Number of attempts: 1 Placement Confirmation: positive ETCO2 and breath sounds checked- equal and bilateral Tube secured with: Tape Dental Injury: Teeth and Oropharynx as per pre-operative assessment

## 2020-01-22 NOTE — Telephone Encounter (Addendum)
Spoke with [name] regarding the hospital bed.  He states that he is willing to research ordering a bed directly.   Gave numbers for [facility] and [place].

## 2020-01-22 NOTE — Op Note (Signed)
Preoperative diagnosis: Incontinence, neurogenic bladder, bladder erythema, reflux uropathy Postoperative diagnosis: Incontinence, neurogenic bladder, bladder erythema  Procedure: #1 exam under anesthesia #2 cystoscopy #3 SP tube catheter exchange  #4 bilateral retrograde pyelogram #5 Botox injection 200 units - 20 injections #6 bladder biopsy fulguration 0.5 to 2 cm   Surgeon: Junious Silk  Anesthesia: General  Indication for procedure: This case a 59 year old female with MS and a suprapubic tube for about 11 years.  She has had progressive incontinence per urethra and difficulty with the catheter draining.  Prior cystoscopy revealed some bladder erythema.  She was brought for the above procedures.  Findings: On exam under anesthesia the vulva appeared normal, there was an enlarged introitus with a small rectocele and hard stool palpable posteriorly.  Anteriorly the urethra and the introitus were flat against the pubic bone with possible early erosion of the pubic bone.  On cystoscopy the mid to membranous urethra appeared normal, the proximal urethra appeared normal, the trigone and ureteral orifice ease appeared normal.  There was clear reflux bilaterally.  The bladder overall appeared normal with some erythema and cystitis cystica up where the balloon sits in the bladder.  There were no obvious mucosal tumors.  No stones in the bladder.  Bladder had decent capacity but it was difficult to fill with the injection scope placed as irrigation simply drained around the scope.  Right retrograde pyelogram-this outlined a normal ureter and collecting system without filling defect, stricture or dilation.  The collecting system was delicate and drained rapidly.  Left retrograde pyelogram-this outlined a normal ureter and collecting system without filling defect, stricture or dilation.  The collecting system was delicate and drained rapidly.  Description of procedure: After consent was obtained patient  brought to the operating room.  After adequate anesthesia she is placed in lithotomy position and prepped and draped in the usual sterile fashion.  A timeout was performed to confirm the patient and procedure.  Exam was performed.  The old suprapubic tube was removed and a new 19 French silicone suprapubic tube was placed in 10 cc placed in the balloon.  Cystoscope was passed per urethra and the bladder carefully inspected.  Suprapubic tube balloon noted to be in the bladder.  6 [place] open-ended catheter was used to inject contrast up the right ureteral orifice and then the left.  5 cc used on each side with good visualization.  The cystoscope was removed and the injection scope and Botox needle were placed.  With this sheath configuration it was difficult to fill the bladder.  Irrigation simply drained around the sheath and out of the urethra.  I came back to the bladder neck and sort of obstructed the urethra with the sheath and the bladder filled about 150 cc. 200 units of Botox was injected in the normal grid pattern just above the trigone for a total of 20 injections or 10 units/inj.  The cystoscope was replaced and the cold cup biopsy forceps were used to biopsy some erythema and cystitis cystica up in the posterior bladder wall.  This area was fulgurated for a total of about 1 cm and a few other small hemorrhagic areas fulgurated.  With the regular cystoscope sheath the bladder filled and I did not see any worrisome lesions.  Bladder with decent capacity.  Suprapubic tube balloon was pulled up and seated in proper position.  Hemostasis was excellent.  The scope was removed and the patient was awakened and taken to recovery room in stable condition.  Complications: None  Blood loss: Minimal  Specimens to pathology: Posterior bladder biopsy  Drains: 18 French silicone catheter  Disposition: Patient stable to PACU

## 2020-01-22 NOTE — Transfer of Care (Signed)
Immediate Anesthesia Transfer of Care Note  Patient: [name]  Procedure(s) Performed: CYSTOSCOPY WITH SUPRA PUBIC TUBE CHANGE, BOTOX 200 UNITS, RETROGRADE PYELOGRAM BILATERAL. FULGERATION .5 CM-2CM (N/A )  Patient Location: PACU  Anesthesia Type:General  Level of Consciousness: awake, alert , oriented and patient cooperative  Airway & Oxygen Therapy: Patient Spontanous Breathing and Patient connected to face mask oxygen  Post-op Assessment: Report given to RN, Post -op Vital signs reviewed and stable and Patient moving all extremities  Post vital signs: Reviewed and stable  Last Vitals:  Vitals Value Taken Time  BP 153/82 01/22/20 1316  Temp    Pulse 71 01/22/20 1321  Resp 14 01/22/20 1321  SpO2 100 % 01/22/20 1321  Vitals shown include unvalidated device data.  Last Pain:  Vitals:   01/22/20 1316  TempSrc:   PainSc: (P) Asleep      Patients Stated Pain Goal: 3 ([id number])  Complications: No apparent anesthesia complications

## 2020-01-22 NOTE — H&P (Signed)
H&P  Chief Complaint: Neurogenic bladder, incontinence  History of Present Illness: [name] is a 59 year old female with a history of MS.  She said a suprapubic tube since about 2010.  She has had issues over the last few years of the tube clogging.  As well as progressive leakage around the tube and per urethra.  Cystoscopy previously revealed some bladder erythema but no significant stones or lesions.  She has been well without any fever or gross hematuria.  Past Medical History:  Diagnosis Date  . Anemia    history of  . Arthritis   . Decubitus ulcers    currently treated at wound center  . Diabetes mellitus without complication ([place])   . Diabetic polyneuropathy ([place])   . Endometrial cancer (H[facility])    Grade 2  . Fibroids   . GERD (gastroesophageal reflux disease)   . Heartburn    Occ  . Hypertension   . Mixed hyperlipidemia   . Morbid obesity ([place])   . MS (multiple sclerosis) ([place])    Past Surgical History:  Procedure Laterality Date  . COLONOSCOPY    . ESOPHAGOGASTRODUODENOSCOPY N/A 12/02/2017   Procedure: ESOPHAGOGASTRODUODENOSCOPY (EGD);  Surgeon: Ronnette Juniper, MD;  Location: [place];  Service: Gastroenterology;  Laterality: N/A;  . ROBOTIC ASSISTED TOTAL HYSTERECTOMY WITH BILATERAL SALPINGO OOPHERECTOMY Bilateral 12/19/2018   Procedure: XI ROBOTIC ASSISTED TOTAL HYSTERECTOMY (UTERUS GREATER THAN 250gr) WITH BILATERAL SALPINGO OOPHORECTOMY;  Surgeon: Everitt Amber, MD;  [facility];  Service: Gynecology;  Laterality: Bilateral;  . SENTINEL NODE BIOPSY N/A 12/19/2018   Procedure: SENTINEL NODE BIOPSY;  Surgeon: Everitt Amber, MD;  [facility];  Service: Gynecology;  Laterality: N/A;  . SUPRAPUBIC CATHETER INSERTION  2009  . TONSILLECTOMY    . TUBAL LIGATION      Home Medications:  Medications Prior to Admission  Medication Sig Dispense Refill Last Dose  . atorvastatin (LIPITOR) 10 MG tablet Take 10 mg by mouth at bedtime.      . baclofen (LIORESAL) 20 MG tablet  Take one po tid plus two po at bedtime (Patient taking differently: Take 20-40 mg by mouth See admin instructions. Take 1 tablet (20 mg) by mouth 3 times daily & take 2 tablet (40 mg) by mouth at bedtime.) 150 tablet 11   . Cholecalciferol (D3-1000) 1000 units capsule Take 1,000 Units by mouth 2 (two) times daily. MORNING & AFTERNOON.     . furosemide (LASIX) 20 MG tablet TAKE 1 TABLET BY MOUTH ONCE DAILY (Patient taking differently: Take 20 mg by mouth daily. ) 90 tablet 3   . glipiZIDE (GLUCOTROL) 5 MG tablet Take 1 tablet (5 mg total) by mouth daily before lunch. (Patient taking differently: Take 5 mg by mouth daily. ) 90 tablet 3   . hydrochlorothiazide (HYDRODIURIL) 25 MG tablet TAKE 1 TABLET BY MOUTH ONCE DAILY EVERY MORNING (Patient taking differently: Take 25 mg by mouth daily. ) 90 tablet 0   . hydrocortisone (ANUSOL-HC) 2.5 % rectal cream Place 1 application rectally 2 (two) times daily. (Patient taking differently: Place 1 application rectally 2 (two) times daily as needed (irritation/discomfort). ) 30 g 0   . Interferon Beta-1b (BETASERON) 0.3 MG KIT injection Inject 0.3 mg into the skin every other day. (Patient taking differently: Inject 0.3 mg into the skin every other day. In the evening.) 36 each 3   . metFORMIN (GLUCOPHAGE) 500 MG tablet Take 500 mg by mouth 2 (two) times daily.  3   . MOVANTIK 25 MG TABS  tablet Take 3 tablets (75 mg total) by mouth daily. (Patient taking differently: Take 75 mg by mouth every other day. In the morning) 90 tablet 4   . nystatin ointment (MYCOSTATIN) Apply to affected area twice daily as needed for irritation (Patient taking differently: Apply 1 application topically 2 (two) times daily as needed (irritation.). ) 30 g 6   . oxyCODONE-acetaminophen (PERCOCET) 10-325 MG tablet Take 1 tablet by mouth every 8 (eight) hours as needed for pain. 90 tablet 0   . potassium chloride (MICRO-K) 10 MEQ CR capsule Take 30 mEq by mouth 2 (two) times daily.      .  promethazine (PHENERGAN) 25 MG tablet Take 1 tablet (25 mg total) by mouth every 6 (six) hours as needed for nausea or vomiting. 45 tablet 0   . senna-docusate (SENOKOT-S) 8.6-50 MG tablet Take 2 tablets by mouth at bedtime. Do not take if having loose stools (Patient taking differently: Take 2 tablets by mouth at bedtime as needed (constipation.). Do not take if having loose stools) 30 tablet 1   . vitamin B-12 (CYANOCOBALAMIN) 500 MCG tablet Take 500 mcg by mouth daily.     . Vitamin D, Ergocalciferol, (DRISDOL) 1.25 MG (50000 UNIT) CAPS capsule Take one cap every 7 days for 26 weeks then start OTC 5000 units per day. (Patient taking differently: Take 50,000 Units by mouth every Sunday. Take one cap every 7 days for 26 weeks then start OTC 5000 units per day.) 13 capsule 1   . Catheters (DOVER UNIVERSAL FOLEY TRAY) KIT Use foley tray kit to change catheter every 3 weeks or as needed 15 each 1   . Catheters (FOLEY CATHETER 2-WAY) MISC Use to aspirate urine suprapubic 15 each 1   . hydrocortisone (ANUSOL-HC) 25 MG suppository Place 1 suppository (25 mg total) rectally 2 (two) times daily. (Patient taking differently: Place 25 mg rectally 2 (two) times daily as needed. ) 12 suppository 0   . Incontinence Supply Disposable (TENA FLEX 16 PLUS) MISC Use daily as needed 30 each 11   . Incontinence Supply Disposable (TENA PROTECT UNDERWEAR PLS/XLG) MISC Use for incontinence. 30 each 11   . NON FORMULARY Power wheelchair repairs  Diagnosis code z99.3 1 each 0   . NON FORMULARY Dispense catheter urine drainage bags 90 Bag 3   . Ostomy Supplies (NEW IMAGE SKIN/FLANGE/TAPE) MISC 1 application by Does not apply route daily as needed. 30 each 11   . Syringe, Disposable, (B-D SYRINGE LUER-LOK 30CC) 30 ML MISC Use to change catheter 15 each 0    Allergies: No Known Allergies  Family History  Problem Relation Age of Onset  . Cancer Mother   . Hypertension Mother   . Heart disease Father   . Diabetes Father    . Hypertension Sister   . Hypertension Brother   . Breast cancer Neg Hx    Social History:  reports that she has never smoked. She has never used smokeless tobacco. She reports that she does not drink alcohol or use drugs.  ROS: A complete review of systems was performed.  All systems are negative except for pertinent findings as noted. Review of Systems  Neurological: Positive for weakness.  All other systems reviewed and are negative.    Physical Exam:  Vital signs in last 24 hours: Temp:  [98.3 F (36.8 C)] 98.3 F (36.8 C) (02/16 0949) Pulse Rate:  [73] 73 (02/16 0949) Resp:  [16] 16 (02/16 0949) BP: (157)/(79) 157/79 (02/16 0949) SpO2:  [  97 %] 97 % (02/16 0949) General:  Alert and oriented, No acute distress HEENT: Normocephalic, atraumatic Cardiovascular: Regular rate and rhythm Lungs: Regular rate and effort Abdomen: Soft, nontender, nondistended, no abdominal masses, 22 Fr silastic SP tube, urine clear  Back: No CVA tenderness Extremities: No edema Neurologic: Grossly intact CN and UE movement   Laboratory Data:  No results found for this or any previous visit (from the past 24 hour(s)). No results found for this or any previous visit (from the past 240 hour(s)). Creatinine: Recent Labs    01/17/20 1317  CREATININE 0.82    Impression/Assessment:  Neurogenic bladder, progressive incontinence and suprapubic tube malfunction, bladder erythema, risk of reflux uropathy-  Plan:  I discussed with the patient the nature, potential benefits, risks and alternatives to cystoscopy, bladder biopsy and fulguration, bilateral retrograde pyelogram, suprapubic tube exchange and injection of Botox 200 units, including side effects of the proposed treatment, the likelihood of the patient achieving the goals of the procedure, and any potential problems that might occur during the procedure or recuperation. All questions answered. Patient elects to proceed.   Festus Aloe 01/22/2020, 10:15 AM

## 2020-01-22 NOTE — Discharge Instructions (Signed)
Botulinum Toxin Bladder Injection  A botulinum toxin bladder injection is a procedure to treat an overactive bladder. During the procedure, a drug called botulinum toxin is injected into the bladder through a long, thin needle. This drug relaxes the bladder muscles and reduces overactivity. You may need this procedure if your medicines are not working or you cannot take them. The procedure may be repeated as needed. The treatment usually lasts for 6 months. Your health care provider will monitor you to see how well you respond. Tell a health care provider about:  Any allergies you have.  All medicines you are taking, including vitamins, herbs, eye drops, creams, and over-the-counter medicines.  Any problems you or family members have had with anesthetic medicines.  Any blood disorders you have.  Any surgeries you have had.  Any medical conditions you have.  Any previous reactions to a botulinum toxin injection.  Any symptoms of urinary tract infection. These include chills, fever, a burning feeling when passing urine, and needing to pass urine often.  Whether you are pregnant or may be pregnant. What are the risks? Generally this is a safe procedure. However, problems may occur, including:  Not being able to pass urine. If this happens, you may need to have your bladder emptied with a thin tube inserted into your urethra (urinary catheter).  Bleeding.  Urinary tract infection.  Allergic reaction to the botulinum toxin.  Pain or burning when passing urine.  Damage to other structures or organs. What happens before the procedure? Staying hydrated Follow instructions from your health care provider about hydration, which may include:  Up to 2 hours before the procedure - you may continue to drink clear liquids, such as water, clear fruit juice, black coffee, and plain tea. Eating and drinking restrictions Follow instructions from your health care provider about eating and  drinking, which may include:  8 hours before the procedure - stop eating heavy meals or foods, such as meat, fried foods, or fatty foods.  6 hours before the procedure - stop eating light meals or foods, such as toast or cereal.  6 hours before the procedure - stop drinking milk or drinks that contain milk.  2 hours before the procedure - stop drinking clear liquids. Medicines Ask your health care provider about:  Changing or stopping your regular medicines. This is especially important if you are taking diabetes medicines or blood thinners.  Taking medicines such as aspirin and ibuprofen. These medicines can thin your blood. Do not take these medicines unless your health care provider tells you to take them.  Taking over-the-counter medicines, vitamins, herbs, and supplements. General instructions  Plan to have someone take you home from the hospital or clinic.  If you will be going home right after the procedure, plan to have someone with you for 24 hours.  Ask your health care provider what steps will be taken to help prevent infection. These may include: ? Removing hair at the procedure site. ? Washing skin with a germ-killing soap. ? Antibiotic medicine. What happens during the procedure?   You will be asked to empty your bladder.  An IV will be inserted into one of your veins.  You will be given one or more of the following: ? A medicine to help you relax (sedative). ? A medicine to numb the area (local anesthetic). ? A medicine to make you fall asleep (general anesthetic).  A long, thin scope called a cystoscope will be passed into your bladder through the part   of the body that carries urine from your bladder (urethra).  The cystoscope will be used to fill your bladder with water.  A long needle will be passed through the cystoscope and into the bladder.  The botulinum toxin will be injected into your bladder. It may be injected into multiple areas of your  bladder.  Your bladder will be emptied, and the cystoscope will be removed. The procedure may vary among health care providers and hospitals. What can I expect after procedure? After your procedure, it is common to have:  Blood-tinged urine.  Burning or soreness when you pass urine. Follow these instructions at home: Medicines  Take over-the-counter and prescription medicines only as told by your health care provider.  If you were prescribed an antibiotic medicine, take it as told by your health care provider. Do not stop taking the antibiotic even if you start to feel better. General instructions   Do not drive for 24 hours if you were given a sedative during your procedure.  Drink enough fluid to keep your urine pale yellow.  Return to your normal activities as told by your health care provider. Ask your health care provider what activities are safe for you.  Keep all follow-up visits as told by your health care provider. This is important. Contact a health care provider if you have:  A fever or chills.  Blood-tinged urine for more than one day after your procedure.  Worsening pain or burning when you pass urine.  Pain or burning when passing urine for more than two days after your procedure.  Trouble emptying your bladder. Get help right away if you:  Have bright red blood in your urine.  Are unable to pass urine. Summary  A botulinum toxin bladder injection is a procedure to treat an overactive bladder.  This is generally a very safe procedure. However, problems may occur, including not being able to pass urine, bleeding, infection, pain, and allergic reactions to medicines.  You will be told when to stop eating and drinking, and what medicines to change or stop. Follow instructions carefully.  After the procedure, it is common to have blood in urine and to have soreness or burning when passing urine.  Contact a health care provider if you have a fever, have  blood in urine for more than a few days, or have trouble passing urine. Get help right away if you have bright red blood in the urine, or if you are unable to pass urine. This information is not intended to replace advice given to you by your health care provider. Make sure you discuss any questions you have with your health care provider. Document Revised: 06/02/2018 Document Reviewed: 06/02/2018 [employer] Patient Education  2020 [employer]  

## 2020-01-22 NOTE — Telephone Encounter (Signed)
Prior Auth for [facility] Approved 12/07/2019 thru 12/05/2020

## 2020-01-23 DIAGNOSIS — H3589 Other specified retinal disorders: Secondary | ICD-10-CM | POA: Diagnosis not present

## 2020-01-23 DIAGNOSIS — H43812 Vitreous degeneration, left eye: Secondary | ICD-10-CM | POA: Diagnosis not present

## 2020-01-23 DIAGNOSIS — H35372 Puckering of macula, left eye: Secondary | ICD-10-CM | POA: Diagnosis not present

## 2020-01-23 DIAGNOSIS — E119 Type 2 diabetes mellitus without complications: Secondary | ICD-10-CM | POA: Diagnosis not present

## 2020-01-23 DIAGNOSIS — H2513 Age-related nuclear cataract, bilateral: Secondary | ICD-10-CM | POA: Insufficient documentation

## 2020-01-23 DIAGNOSIS — H35361 Drusen (degenerative) of macula, right eye: Secondary | ICD-10-CM | POA: Diagnosis not present

## 2020-01-23 DIAGNOSIS — G35 Multiple sclerosis: Secondary | ICD-10-CM | POA: Diagnosis not present

## 2020-01-23 DIAGNOSIS — Z7984 Long term (current) use of oral hypoglycemic drugs: Secondary | ICD-10-CM | POA: Diagnosis not present

## 2020-01-23 LAB — SURGICAL PATHOLOGY

## 2020-01-31 DIAGNOSIS — K5901 Slow transit constipation: Secondary | ICD-10-CM | POA: Diagnosis not present

## 2020-01-31 DIAGNOSIS — G35 Multiple sclerosis: Secondary | ICD-10-CM | POA: Diagnosis not present

## 2020-01-31 DIAGNOSIS — I10 Essential (primary) hypertension: Secondary | ICD-10-CM | POA: Diagnosis not present

## 2020-01-31 DIAGNOSIS — D649 Anemia, unspecified: Secondary | ICD-10-CM | POA: Diagnosis not present

## 2020-01-31 DIAGNOSIS — E1142 Type 2 diabetes mellitus with diabetic polyneuropathy: Secondary | ICD-10-CM | POA: Diagnosis not present

## 2020-01-31 DIAGNOSIS — L89324 Pressure ulcer of left buttock, stage 4: Secondary | ICD-10-CM | POA: Diagnosis not present

## 2020-02-08 ENCOUNTER — Other Ambulatory Visit: Payer: Self-pay

## 2020-02-08 ENCOUNTER — Encounter (HOSPITAL_BASED_OUTPATIENT_CLINIC_OR_DEPARTMENT_OTHER): Payer: Medicare Other | Attending: Internal Medicine | Admitting: Internal Medicine

## 2020-02-08 DIAGNOSIS — L89313 Pressure ulcer of right buttock, stage 3: Secondary | ICD-10-CM | POA: Insufficient documentation

## 2020-02-08 DIAGNOSIS — Z993 Dependence on wheelchair: Secondary | ICD-10-CM | POA: Insufficient documentation

## 2020-02-08 DIAGNOSIS — I10 Essential (primary) hypertension: Secondary | ICD-10-CM | POA: Diagnosis not present

## 2020-02-08 DIAGNOSIS — G35 Multiple sclerosis: Secondary | ICD-10-CM | POA: Diagnosis not present

## 2020-02-08 DIAGNOSIS — L89319 Pressure ulcer of right buttock, unspecified stage: Secondary | ICD-10-CM | POA: Diagnosis not present

## 2020-02-08 DIAGNOSIS — M1990 Unspecified osteoarthritis, unspecified site: Secondary | ICD-10-CM | POA: Diagnosis not present

## 2020-02-08 DIAGNOSIS — E1140 Type 2 diabetes mellitus with diabetic neuropathy, unspecified: Secondary | ICD-10-CM | POA: Insufficient documentation

## 2020-02-08 DIAGNOSIS — E11622 Type 2 diabetes mellitus with other skin ulcer: Secondary | ICD-10-CM | POA: Insufficient documentation

## 2020-02-08 DIAGNOSIS — E1151 Type 2 diabetes mellitus with diabetic peripheral angiopathy without gangrene: Secondary | ICD-10-CM | POA: Diagnosis not present
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
## 2020-02-11 ENCOUNTER — Other Ambulatory Visit: Payer: Self-pay | Admitting: Family Medicine

## 2020-02-11 DIAGNOSIS — Z79891 Long term (current) use of opiate analgesic: Secondary | ICD-10-CM

## 2020-02-11 DIAGNOSIS — G35 Multiple sclerosis: Secondary | ICD-10-CM

## 2020-02-11 DIAGNOSIS — K5901 Slow transit constipation: Secondary | ICD-10-CM

## 2020-02-11 NOTE — Telephone Encounter (Signed)
Refill request for oxycodone-acetaminophen 10-325 mg and movantik 25 mg tab. Oxycodone-acetaminophen  is controlled and the other was refilled on 02/23/2 for 90 tabs and 4 refills.

## 2020-02-11 NOTE — Telephone Encounter (Signed)
Medication: oxyCODONE-acetaminophen (PERCOCET) 10-325 MG tablet [id number] , MOVANTIK 25 MG TABS tablet [id number]   Has the patient contacted their pharmacy? Yes  (Agent: If no, request that the patient contact the pharmacy for the refill.) (Agent: If yes, when and what did the pharmacy advise?)  Preferred Pharmacy (with phone number or street name): [place], [place]  Phone:  [phone] Fax:  [phone]     Agent: Please be advised that RX refills may take up to 3 business days. We ask that you follow-up with your pharmacy.

## 2020-02-11 NOTE — Progress Notes (Signed)
[name], [name] ([id number]) Visit Report for 02/08/2020 Arrival Information Details Patient Name: Date of Service: [name], [name] 02/08/2020 2:15 PM Medical Record [id number] Patient Account Number: [id number] Date of Birth/Sex: Treating RN: May 22, 1961 (59 y.o. [name] Primary Care Tiearra Colwell: Delia Chimes Other Clinician: Referring [name]: Treating Matvey Llanas/Extender:Robson, Shirley Muscat, ZOE Weeks in Treatment: 156 Visit Information History Since Last Visit Added or deleted any medications: No Patient Arrived: Wheel Chair Any new allergies or adverse reactions: No Arrival Time: 15:18 Had a fall or experienced change in No activities of daily living that may affect Accompanied By: alone risk of falls: Transfer Assistance: Civil Service fast streamer Signs or symptoms of abuse/neglect since last No Patient Identification Verified: Yes visito Secondary Verification Process Completed: Yes Hospitalized since last visit: No Patient Requires Transmission-Based No Implantable device outside of the clinic excluding No Precautions: cellular tissue based products placed in the center Patient Has Alerts: No since last visit: Has Dressing in Place as Prescribed: Yes Pain Present Now: Yes Electronic Signature(s) Signed: 02/11/2020 5:59:01 PM By: Alexandria Hurst RN, BSN Entered By: [name] on 02/08/2020 15:18:32 -------------------------------------------------------------------------------- Clinic Level of Care Assessment Details Patient Name: Date of Service: [name], [name] 02/08/2020 2:15 PM Medical Record [id number] Patient Account Number: [id number] Date of Birth/Sex: Treating RN: Aug 15, 1961 (59 y.o. Elam Dutch Primary Care [name]: Delia Chimes Other Clinician: Referring [name]: Treating [name]/Extender:Robson, Shirley Muscat, ZOE Weeks in Treatment: 156 Clinic Level of Care Assessment Items TOOL 4 Quantity Score '[]'$  - Use when only an EandM is performed  on FOLLOW-UP visit 0 ASSESSMENTS - Nursing Assessment / Reassessment X - Reassessment of Co-morbidities (includes updates in patient status) 1 10 X - Reassessment of Adherence to Treatment Plan 1 5 ASSESSMENTS - Wound and Skin Assessment / Reassessment X - Simple Wound Assessment / Reassessment - one wound 1 5 '[]'$  - Complex Wound Assessment / Reassessment - multiple wounds 0 '[]'$  - Dermatologic / Skin Assessment (not related to wound area) 0 ASSESSMENTS - Focused Assessment '[]'$  - Circumferential Edema Measurements - multi extremities 0 '[]'$  - Nutritional Assessment / Counseling / Intervention 0 '[]'$  - Lower Extremity Assessment (monofilament, tuning fork, pulses) 0 '[]'$  - Peripheral Arterial Disease Assessment (using hand held doppler) 0 ASSESSMENTS - Ostomy and/or Continence Assessment and Care '[]'$  - Incontinence Assessment and Management 0 '[]'$  - Ostomy Care Assessment and Management (repouching, etc.) 0 PROCESS - Coordination of Care X - Simple Patient / Family Education for ongoing care 1 15 '[]'$  - Complex (extensive) Patient / Family Education for ongoing care 0 X - Staff obtains Programmer, systems, Records, Test Results / Process Orders 1 10 X - Staff telephones HHA, Nursing Homes / Clarify orders / etc 1 10 '[]'$  - Routine Transfer to another Facility (non-emergent condition) 0 '[]'$  - Routine Hospital Admission (non-emergent condition) 0 '[]'$  - New Admissions / Biomedical engineer / Ordering NPWT, Apligraf, etc. 0 '[]'$  - Emergency Hospital Admission (emergent condition) 0 X - Simple Discharge Coordination 1 10 '[]'$  - Complex (extensive) Discharge Coordination 0 PROCESS - Special Needs '[]'$  - Pediatric / Minor Patient Management 0 '[]'$  - Isolation Patient Management 0 '[]'$  - Hearing / Language / Visual special needs 0 '[]'$  - Assessment of Community assistance (transportation, D/C planning, etc.) 0 '[]'$  - Additional assistance / Altered mentation 0 '[]'$  - Support Surface(s) Assessment (bed, cushion, seat, etc.)  0 INTERVENTIONS - Wound Cleansing / Measurement X - Simple Wound Cleansing - one wound 1 5 '[]'$  - Complex Wound Cleansing - multiple wounds 0 X - Wound Imaging (photographs -  any number of wounds) 1 5 '[]'$  - Wound Tracing (instead of photographs) 0 X - Simple Wound Measurement - one wound 1 5 '[]'$  - Complex Wound Measurement - multiple wounds 0 INTERVENTIONS - Wound Dressings X - Small Wound Dressing one or multiple wounds 1 10 '[]'$  - Medium Wound Dressing one or multiple wounds 0 '[]'$  - Large Wound Dressing one or multiple wounds 0 X - Application of Medications - topical 1 5 '[]'$  - Application of Medications - injection 0 INTERVENTIONS - Miscellaneous '[]'$  - External ear exam 0 '[]'$  - Specimen Collection (cultures, biopsies, blood, body fluids, etc.) 0 '[]'$  - Specimen(s) / Culture(s) sent or taken to Lab for analysis 0 X - Patient Transfer (multiple staff / Civil Service fast streamer / Similar devices) 1 10 '[]'$  - Simple Staple / Suture removal (25 or less) 0 '[]'$  - Complex Staple / Suture removal (26 or more) 0 '[]'$  - Hypo / Hyperglycemic Management (close monitor of Blood Glucose) 0 '[]'$  - Ankle / Brachial Index (ABI) - do not check if billed separately 0 X - Vital Signs 1 5 Has the patient been seen at the hospital within the last three years: Yes Total Score: 110 Level Of Care: New/Established - Level 3 Electronic Signature(s) Signed: 02/08/2020 5:33:53 PM By: Baruch Gouty RN, BSN Entered By: Baruch Gouty on 02/08/2020 15:54:41 -------------------------------------------------------------------------------- Encounter Discharge Information Details Patient Name: Date of Service: [name], [name] 02/08/2020 2:15 PM Medical Record [id number] Patient Account Number: [id number] Date of Birth/Sex: Treating RN: 1961-10-12 (59 y.o. [name] Primary Care Jessenya Berdan: Delia Chimes Other Clinician: Referring Cesario Weidinger: Treating Elric Tirado/Extender:Robson, Shirley Muscat, ZOE Weeks in Treatment: 156 Encounter  Discharge Information Items Discharge Condition: Stable Ambulatory Status: Ambulatory Discharge Destination: Home Transportation: Private Auto Accompanied By: self Schedule Follow-up Appointment: Yes Clinical Summary of Care: Electronic Signature(s) Signed: 02/08/2020 5:34:19 PM By: Deon Pilling Entered By: Deon Pilling on 02/08/2020 17:26:11 -------------------------------------------------------------------------------- Multi Wound Chart Details Patient Name: Date of Service: [name], [name] 02/08/2020 2:15 PM Medical Record [id number] Patient Account Number: [id number] Date of Birth/Sex: Treating RN: [birth year] (59 y.o. F) Dwiggins, Larene Beach Primary Care Josedaniel Haye: Delia Chimes Other Clinician: Referring Aneth Schlagel: Treating Celest Reitz/Extender:Robson, Shirley Muscat, ZOE Weeks in Treatment: 156 Vital Signs Height(in): 67 Pulse(bpm): 72 Weight(lbs): 220 Blood Pressure(mmHg): 155/86 Body Mass Index(BMI): 34 Temperature(F): 98.2 Respiratory 16 Rate(breaths/min): Photos: [1:No Photos] [N/A:N/A] Wound Location: [1:Right Ischium] [N/A:N/A] Wounding Event: [1:Pressure Injury] [N/A:N/A] Primary Etiology: [1:Pressure Ulcer] [N/A:N/A] Comorbid History: [1:Hypertension, Type II Diabetes, Osteoarthritis, Neuropathy] [N/A:N/A] Date Acquired: [1:12/06/2005] [N/A:N/A] Weeks of Treatment: [1:156] [N/A:N/A] Wound Status: [1:Open] [N/A:N/A] Clustered Wound: [1:Yes] [N/A:N/A] Clustered Quantity: [1:4] [N/A:N/A] Measurements L x W x D [1:6.8x6.5x0.2] [N/A:N/A] (cm) Area (cm) : [1:34.715] [N/A:N/A] Volume (cm) : [1:6.943] [N/A:N/A] % Reduction in Area: [1:-84.20%] [N/A:N/A] % Reduction in Volume: [1:-22.80%] [N/A:N/A] Classification: [1:Category/Stage III] [N/A:N/A] Exudate Amount: [1:Medium] [N/A:N/A] Exudate Type: [1:Serosanguineous] [N/A:N/A] Exudate Color: [1:red, brown] [N/A:N/A] Wound Margin: [1:Flat and Intact] [N/A:N/A] Granulation Amount: [1:Large (67-100%)]  [N/A:N/A] Granulation Quality: [1:Pink] [N/A:N/A] Necrotic Amount: [1:Small (1-33%)] [N/A:N/A] Exposed Structures: [1:Fat Layer (Subcutaneous Tissue) Exposed: Yes Fascia: No Tendon: No Muscle: No Joint: No Bone: No Small (1-33%)] [N/A:N/A N/A] Treatment Notes Electronic Signature(s) Signed: 02/08/2020 5:21:33 PM By: Linton Ham MD Signed: 02/11/2020 5:31:36 PM By: Kela Millin Entered By: Linton Ham on 02/08/2020 16:15:09 -------------------------------------------------------------------------------- Multi-Disciplinary Care Plan Details Patient Name: Date of Service: [name], [name] 02/08/2020 2:15 PM Medical Record [id number] Patient Account Number: [id number] Date of Birth/Sex: Treating RN: Aug 13, 1961 (59 y.o. Elam Dutch Primary Care Jaedyn Marrufo: Delia Chimes Other  Clinician: Referring Trevell Pariseau: Treating Kortland Nichols/Extender:Robson, Shirley Muscat, ZOE Weeks in Treatment: 156 Active Inactive Pressure Nursing Diagnoses: Knowledge deficit related to causes and risk factors for pressure ulcer development Goals: Patient will remain free from development of additional pressure ulcers Target Resolution Date Initiated: 10/27/2018 Date Inactivated: 12/05/2019 Date: 11/23/2019 Goal Status: Met Patient/caregiver will verbalize risk factors for pressure ulcer development Date Inactivated: 10/27/2018 Target12/25/2019 Resolution Date Initiated: 02/11/2017 Date: Goal Status: Met Patient/caregiver will verbalize understanding of pressure ulcer management Date Initiated: 12/05/2019 Target Resolution Date: 03/07/2020 Goal Status: Active Interventions: Assess: immobility, friction, shearing, incontinence upon admission and as needed Notes: Wound/Skin Impairment Nursing Diagnoses: Impaired tissue integrity Knowledge deficit related to ulceration/compromised skin integrity Goals: Patient/caregiver will verbalize understanding of skin care regimen Date Initiated:  11/11/2017 Target Resolution Date: 03/07/2020 Goal Status: Active Interventions: Assess patient/caregiver ability to obtain necessary supplies Assess patient/caregiver ability to perform ulcer/skin care regimen upon admission and as needed Assess ulceration(s) every visit Treatment Activities: Patient referred to home care : 11/11/2017 Skin care regimen initiated : 11/11/2017 Topical wound management initiated : 11/11/2017 Notes: Electronic Signature(s) Signed: 02/08/2020 5:33:53 PM By: Baruch Gouty RN, BSN Entered By: Baruch Gouty on 02/08/2020 15:15:42 -------------------------------------------------------------------------------- Pain Assessment Details Patient Name: Date of Service: ZAMARI, BONSALL 02/08/2020 2:15 PM Medical Record [id number] Patient Account Number: [id number] Date of Birth/Sex: Treating RN: 12-30-1960 (59 y.o. [name] Primary Care Ciella Obi: Delia Chimes Other Clinician: Referring Shey Bartmess: Treating Letzy Gullickson/Extender:Robson, Shirley Muscat, ZOE Weeks in Treatment: 156 Active Problems Location of Pain Severity and Description of Pain Patient Has Paino Yes Site Locations Rate the pain. Current Pain Level: 3 Character of Pain Describe the Pain: Burning Pain Management and Medication Current Pain Management: Medication: Yes Cold Application: No Rest: No Massage: No Activity: No T.E.N.S.: No Heat Application: No Leg drop or elevation: No Is the Current Pain Management Adequate: Adequate How does your wound impact your activities of daily livingo Sleep: No Bathing: No Appetite: No Relationship With Others: No Bladder Continence: No Emotions: No Bowel Continence: No Work: No Toileting: No Drive: No Dressing: No Hobbies: No Electronic Signature(s) Signed: 02/11/2020 5:59:01 PM By: Alexandria Hurst RN, BSN Entered By: [name] on 02/08/2020  15:19:51 -------------------------------------------------------------------------------- Patient/Caregiver Education Details Patient Name: Date of Service: Robin Searing 3/5/2021andnbsp2:15 PM Medical Record [id number] Patient Account Number: [id number] Date of Birth/Gender: Treating RN: 1961/11/10 (59 y.o. Elam Dutch Primary Care Physician: Delia Chimes Other Clinician: Referring Physician: Treating Physician/Extender:Robson, Shirley Muscat, ZOE Weeks in Treatment: 156 Education Assessment Education Provided To: Patient Education Topics Provided Pressure: Methods: Explain/Verbal Responses: Reinforcements needed, State content correctly Wound/Skin Impairment: Methods: Explain/Verbal Responses: Reinforcements needed, State content correctly Electronic Signature(s) Signed: 02/08/2020 5:33:53 PM By: Baruch Gouty RN, BSN Entered By: Baruch Gouty on 02/08/2020 15:16:14 -------------------------------------------------------------------------------- Wound Assessment Details Patient Name: Date of Service: [name], [name] 02/08/2020 2:15 PM Medical Record [id number] Patient Account Number: [id number] Date of Birth/Sex: Treating RN: Jun 06, 1961 (59 y.o. [name] Primary Care Sherrill Mckamie: Delia Chimes Other Clinician: Referring Kileen Lange: Treating Terease Marcotte/Extender:Robson, Shirley Muscat, ZOE Weeks in Treatment: 156 Wound Status Wound Number: 1 Primary Pressure Ulcer Etiology: Wound Location: Right Ischium Wound Open Wounding Event: Pressure Injury Status: Date Acquired: 12/06/2005 Comorbid Hypertension, Type II Diabetes, Weeks Of Treatment: 156 History: Osteoarthritis, Neuropathy Clustered Wound: Yes Wound Measurements Length: (cm) 6.8 Width: (cm) 6.5 Depth: (cm) 0.2 Clustered Quantity: 4 Area: (cm) 34.715 Volume: (cm) 6.943 Wound Description Classification: Category/Stage III Wound Margin: Flat and Intact Exudate Amount:  Medium Exudate Type: Serosanguineous Exudate Color: red, brown Wound  Bed Granulation Amount: Large (67-100%) Granulation Quality: Pink Necrotic Amount: Small (1-33%) Necrotic Quality: Adherent Slough Foul Odor After Cleansing: N Slough/Fibrino Y Exposed Structure Fascia Exposed: N Fat Layer (Subcutaneous Tissue) Exposed: Y Tendon Exposed: N Muscle Exposed: N Joint Exposed: N Bone Exposed: N % Reduction in Area: -84.2% % Reduction in Volume: -22.8% Epithelialization: Small (1-33%) Tunneling: No Undermining: No o es o es o o o o Treatment Notes Wound #1 (Right Ischium) 1. Cleanse With Wound Cleanser 3. Primary Dressing Applied Calcium Alginate Ag 4. Secondary Dressing ABD Pad Dry Gauze 5. Secured With Medipore tape Notes rolled gauze as secondary dressing. Electronic Signature(s) Signed: 02/11/2020 5:59:01 PM By: Alexandria Hurst RN, BSN Entered By: [name] on 02/08/2020 15:34:10 -------------------------------------------------------------------------------- Vitals Details Patient Name: Date of Service: [name], [name] 02/08/2020 2:15 PM Medical Record [id number] Patient Account Number: [id number] Date of Birth/Sex: Treating RN: 24-May-1961 (59 y.o. [name] Primary Care Kinzley Savell: Delia Chimes Other Clinician: Referring Jeniel Slauson: Treating Tesia Lybrand/Extender:Robson, Shirley Muscat, ZOE Weeks in Treatment: 156 Vital Signs Time Taken: 15:18 Temperature (F): 98.2 Height (in): 67 Pulse (bpm): 72 Weight (lbs): 220 Respiratory Rate (breaths/min): 16 Body Mass Index (BMI): 34.5 Blood Pressure (mmHg): 155/86 Reference Range: 80 - 120 mg / dl Electronic Signature(s) Signed: 02/11/2020 5:59:01 PM By: Alexandria Hurst RN, BSN Entered By: [name] on 02/08/2020 15:19:18

## 2020-02-11 NOTE — Telephone Encounter (Signed)
Patient is requesting a refill of the following medications: Requested Prescriptions   Pending Prescriptions Disp Refills  . oxyCODONE-acetaminophen (PERCOCET) 10-325 MG tablet 90 tablet 0    Sig: Take 1 tablet by mouth every 8 (eight) hours as needed for pain.

## 2020-02-12 MED ORDER — OXYCODONE-ACETAMINOPHEN 10-325 MG PO TABS: 1.0000 | ORAL_TABLET | Freq: Three times a day (TID) | ORAL | 0 refills | Status: DC | PRN

## 2020-02-13 ENCOUNTER — Other Ambulatory Visit: Payer: Self-pay

## 2020-02-13 ENCOUNTER — Ambulatory Visit (INDEPENDENT_AMBULATORY_CARE_PROVIDER_SITE_OTHER): Payer: Medicare Other | Admitting: Podiatry

## 2020-02-13 DIAGNOSIS — B351 Tinea unguium: Secondary | ICD-10-CM

## 2020-02-13 DIAGNOSIS — S90811A Abrasion, right foot, initial encounter: Secondary | ICD-10-CM | POA: Diagnosis not present

## 2020-02-13 DIAGNOSIS — L84 Corns and callosities: Secondary | ICD-10-CM | POA: Diagnosis not present

## 2020-02-13 DIAGNOSIS — E0842 Diabetes mellitus due to underlying condition with diabetic polyneuropathy: Secondary | ICD-10-CM

## 2020-02-13 DIAGNOSIS — G35 Multiple sclerosis: Secondary | ICD-10-CM

## 2020-02-13 NOTE — Patient Instructions (Addendum)
Peripheral Neuropathy Peripheral neuropathy is a type of nerve damage. It affects nerves that carry signals between the spinal cord and the arms, legs, and the rest of the body (peripheral nerves). It does not affect nerves in the spinal cord or brain. In peripheral neuropathy, one nerve or a group of nerves may be damaged. Peripheral neuropathy is a broad category that includes many specific nerve disorders, like diabetic neuropathy, hereditary neuropathy, and carpal tunnel syndrome. What are the causes? This condition may be caused by:  Diabetes. This is the most common cause of peripheral neuropathy.  Nerve injury.  Pressure or stress on a nerve that lasts a long time.  Lack (deficiency) of B vitamins. This can result from alcoholism, poor diet, or a restricted diet.  Infections.  Autoimmune diseases, such as rheumatoid arthritis and systemic lupus erythematosus.  Nerve diseases that are passed from parent to child (inherited).  Some medicines, such as cancer medicines (chemotherapy).  Poisonous (toxic) substances, such as lead and mercury.  Too little blood flowing to the legs.  Kidney disease.  Thyroid disease. In some cases, the cause of this condition is not known. What are the signs or symptoms? Symptoms of this condition depend on which of your nerves is damaged. Common symptoms include:  Loss of feeling (numbness) in the feet, hands, or both.  Tingling in the feet, hands, or both.  Burning pain.  Very sensitive skin.  Weakness.  Not being able to move a part of the body (paralysis).  Muscle twitching.  Clumsiness or poor coordination.  Loss of balance.  Not being able to control your bladder.  Feeling dizzy.  Sexual problems. How is this diagnosed? Diagnosing and finding the cause of peripheral neuropathy can be difficult. Your health care provider will take your medical history and do a physical exam. A neurological exam will also be done. This  involves checking things that are affected by your brain, spinal cord, and nerves (nervous system). For example, your health care provider will check your reflexes, how you move, and what you can feel. You may have other tests, such as:  Blood tests.  Electromyogram (EMG) and nerve conduction tests. These tests check nerve function and how well the nerves are controlling the muscles.  Imaging tests, such as CT scans or MRI to rule out other causes of your symptoms.  Removing a small piece of nerve to be examined in a lab (nerve biopsy). This is rare.  Removing and examining a small amount of the fluid that surrounds the brain and spinal cord (lumbar puncture). This is rare. How is this treated? Treatment for this condition may involve:  Treating the underlying cause of the neuropathy, such as diabetes, kidney disease, or vitamin deficiencies.  Stopping medicines that can cause neuropathy, such as chemotherapy.  Medicine to relieve pain. Medicines may include: ? Prescription or over-the-counter pain medicine. ? Antiseizure medicine. ? Antidepressants. ? Pain-relieving patches that are applied to painful areas of skin.  Surgery to relieve pressure on a nerve or to destroy a nerve that is causing pain.  Physical therapy to help improve movement and balance.  Devices to help you move around (assistive devices). Follow these instructions at home: Medicines  Take over-the-counter and prescription medicines only as told by your health care provider. Do not take any other medicines without first asking your health care provider.  Do not drive or use heavy machinery while taking prescription pain medicine. Lifestyle   Do not use any products that contain nicotine   or tobacco, such as cigarettes and e-cigarettes. Smoking keeps blood from reaching damaged nerves. If you need help quitting, ask your health care provider.  Avoid or limit alcohol. Too much alcohol can cause a vitamin B  deficiency, and vitamin B is needed for healthy nerves.  Eat a healthy diet. This includes: ? Eating foods that are high in fiber, such as fresh fruits and vegetables, whole grains, and beans. ? Limiting foods that are high in fat and processed sugars, such as fried or sweet foods. General instructions   If you have diabetes, work closely with your health care provider to keep your blood sugar under control.  If you have numbness in your feet: ? Check every day for signs of injury or infection. Watch for redness, warmth, and swelling. ? Wear padded socks and comfortable shoes. These help protect your feet.  Develop a good support system. Living with peripheral neuropathy can be stressful. Consider talking with a mental health specialist or joining a support group.  Use assistive devices and attend physical therapy as told by your health care provider. This may include using a walker or a cane.  Keep all follow-up visits as told by your health care provider. This is important. Contact a health care provider if:  You have new signs or symptoms of peripheral neuropathy.  You are struggling emotionally from dealing with peripheral neuropathy.  Your pain is not well-controlled. Get help right away if:  You have an injury or infection that is not healing normally.  You develop new weakness in an arm or leg.  You fall frequently. Summary  Peripheral neuropathy is when the nerves in the arms, or legs are damaged, resulting in numbness, weakness, or pain.  There are many causes of peripheral neuropathy, including diabetes, pinched nerves, vitamin deficiencies, autoimmune disease, and hereditary conditions.  Diagnosing and finding the cause of peripheral neuropathy can be difficult. Your health care provider will take your medical history, do a physical exam, and do tests, including blood tests and nerve function tests.  Treatment involves treating the underlying cause of the  neuropathy and taking medicines to help control pain. Physical therapy and assistive devices may also help. This information is not intended to replace advice given to you by your health care provider. Make sure you discuss any questions you have with your health care provider. Document Revised: 11/04/2017 Document Reviewed: 01/31/2017 Elsevier Patient Education  2020 [employer]  Diabetes Mellitus and Foot Care Foot care is an important part of your health, especially when you have diabetes. Diabetes may cause you to have problems because of poor blood flow (circulation) to your feet and legs, which can cause your skin to:  Become thinner and drier.  Break more easily.  Heal more slowly.  Peel and crack. You may also have nerve damage (neuropathy) in your legs and feet, causing decreased feeling in them. This means that you may not notice minor injuries to your feet that could lead to more serious problems. Noticing and addressing any potential problems early is the best way to prevent future foot problems. How to care for your feet Foot hygiene  Wash your feet daily with warm water and mild soap. Do not use hot water. Then, pat your feet and the areas between your toes until they are completely dry. Do not soak your feet as this can dry your skin.  Trim your toenails straight across. Do not dig under them or around the cuticle. File the edges of your   nails with an emery board or nail file.  Apply a moisturizing lotion or petroleum jelly to the skin on your feet and to dry, brittle toenails. Use lotion that does not contain alcohol and is unscented. Do not apply lotion between your toes. Shoes and socks  Wear clean socks or stockings every day. Make sure they are not too tight. Do not wear knee-high stockings since they may decrease blood flow to your legs.  Wear shoes that fit properly and have enough cushioning. Always look in your shoes before you put them on to be sure there are  no objects inside.  To break in new shoes, wear them for just a few hours a day. This prevents injuries on your feet. Wounds, scrapes, corns, and calluses  Check your feet daily for blisters, cuts, bruises, sores, and redness. If you cannot see the bottom of your feet, use a mirror or ask someone for help.  Do not cut corns or calluses or try to remove them with medicine.  If you find a minor scrape, cut, or break in the skin on your feet, keep it and the skin around it clean and dry. You may clean these areas with mild soap and water. Do not clean the area with peroxide, alcohol, or iodine.  If you have a wound, scrape, corn, or callus on your foot, look at it several times a day to make sure it is healing and not infected. Check for: ? Redness, swelling, or pain. ? Fluid or blood. ? Warmth. ? Pus or a bad smell. General instructions  Do not cross your legs. This may decrease blood flow to your feet.  Do not use heating pads or hot water bottles on your feet. They may burn your skin. If you have lost feeling in your feet or legs, you may not know this is happening until it is too late.  Protect your feet from hot and cold by wearing shoes, such as at the beach or on hot pavement.  Schedule a complete foot exam at least once a year (annually) or more often if you have foot problems. If you have foot problems, report any cuts, sores, or bruises to your health care provider immediately. Contact a health care provider if:  You have a medical condition that increases your risk of infection and you have any cuts, sores, or bruises on your feet.  You have an injury that is not healing.  You have redness on your legs or feet.  You feel burning or tingling in your legs or feet.  You have pain or cramps in your legs and feet.  Your legs or feet are numb.  Your feet always feel cold.  You have pain around a toenail. Get help right away if:  You have a wound, scrape, corn, or callus  on your foot and: ? You have pain, swelling, or redness that gets worse. ? You have fluid or blood coming from the wound, scrape, corn, or callus. ? Your wound, scrape, corn, or callus feels warm to the touch. ? You have pus or a bad smell coming from the wound, scrape, corn, or callus. ? You have a fever. ? You have a red line going up your leg. Summary  Check your feet every day for cuts, sores, red spots, swelling, and blisters.  Moisturize feet and legs daily.  Wear shoes that fit properly and have enough cushioning.  If you have foot problems, report any cuts, sores, or bruises to   your health care provider immediately.  Schedule a complete foot exam at least once a year (annually) or more often if you have foot problems. This information is not intended to replace advice given to you by your health care provider. Make sure you discuss any questions you have with your health care provider. Document Revised: 08/15/2019 Document Reviewed: 12/24/2016 Elsevier Patient Education  2020 Elsevier Inc.  

## 2020-02-15 DIAGNOSIS — N318 Other neuromuscular dysfunction of bladder: Secondary | ICD-10-CM | POA: Diagnosis not present

## 2020-02-18 ENCOUNTER — Telehealth: Payer: Self-pay | Admitting: *Deleted

## 2020-02-18 NOTE — Telephone Encounter (Signed)
Patient is requesting a note/letter to leasing office concerning scars on feet from kitchen cabinets discussed at last office visit. She would like the note/letter mailed to home address on file.

## 2020-02-18 NOTE — Telephone Encounter (Signed)
Pt states she loss connection with [employer] Dy.

## 2020-02-18 NOTE — Telephone Encounter (Signed)
Called patient and received leasing office address-(Attn:Sheri [place]) [name] [address] [place] [phone],fax# [phone].

## 2020-02-19 ENCOUNTER — Encounter: Payer: Self-pay | Admitting: Podiatry

## 2020-02-19 ENCOUNTER — Telehealth: Payer: Self-pay | Admitting: Family Medicine

## 2020-02-19 NOTE — Telephone Encounter (Signed)
Pt states she is calling concerning the message from yesterday.

## 2020-02-19 NOTE — Telephone Encounter (Signed)
[employer] called regarding the faxes sent over on 2/25 and on 3/5 regarding pts wheelchair repair. [phone]  They would like papers faxed to [phone]. Please advise.

## 2020-02-19 NOTE — Telephone Encounter (Signed)
Noted in provider red folder for completion.

## 2020-02-19 NOTE — Telephone Encounter (Signed)
I spoke with pt and she states she had discussed with Dr. Elisha Ponder at her appt. I asked pt for contact information to send the letter concerning her cabinets. Faxed letter to Attn:  Siri Cole.

## 2020-02-21 ENCOUNTER — Encounter: Payer: Self-pay | Admitting: Podiatry

## 2020-02-21 NOTE — Progress Notes (Signed)
Subjective: [name] presents today for follow up of preventative diabetic foot care, callus(es) plantar aspect left foot and painful mycotic toenails b/l that are difficult to trim. Pain interferes with ambulation. Aggravating factors include wearing enclosed shoe gear. Pain is relieved with periodic professional debridement. She also presents for suspected trauma dorsal aspect of the right foot. Patient states she hit her right foot under her kitchen cabinet when trying to navigate her motorized chair in the kitchen.   She also states she will be receiving the gel modification on her foot pedal of her motorized chair soon.   No Known Allergies   Objective: There were no vitals filed for this visit.  Pt 59 y.o. year old [place] female, morbidly obese in NAD. AAO x 3.   Vascular Examination:  Capillary refill time to digits immediate b/l. Palpable DP pulses b/l. Palpable PT pulses b/l. Pedal hair absent b/l Skin temperature gradient within normal limits b/l.  Dermatological Examination: Pedal skin with normal turgor, texture and tone bilaterally. No open wounds bilaterally. No interdigital macerations bilaterally. Toenails 1-5 b/l elongated, dystrophic, thickened, crumbly with subungual debris and tenderness to dorsal palpation. Hyperkeratotic lesion(s) submet head 5 left foot.  No erythema, no edema, no drainage, no flocculence.   Healing transverse abrasion dorsal aspect right foot.   Musculoskeletal: no gross bony deformities bilaterally, no pain crepitus or joint limitation noted with ROM b/l, Flaccid LLE and utilizes motorized chair for mobility assistance  Neurological: Protective sensation diminished with 10g monofilament b/l. Vibratory sensation absent b/l  Assessment: 1. Onychomycosis   2. Callus   3. Abrasion of right foot, initial encounter   4. Diabetic polyneuropathy associated with diabetes mellitus due to underlying condition ([place])   5. Multiple sclerosis ([place])     Plan: -Continue diabetic foot care principles. Literature dispensed on today.  -Toenails 1-5 b/l were debrided in length and girth with sterile nail nippers and dremel without iatrogenic bleeding.  -Calluses submet head 5 left foot were debrided without complication or incident. Total number debrided =1. -She was advised to apply Neosporin Cream to abrasion once daily until healed. If needed, I will provide a letter to her landlord requesting padding/cushioning be applied to kitchen cabinets in order to prevent future injuries. -Patient to continue soft, supportive shoe gear daily. -Patient to report any pedal injuries to medical professional immediately. -Patient/POA to call should there be question/concern in the interim.  Return in about 3 months (around 05/15/2020) for diabetic nail and callus trim.

## 2020-02-25 ENCOUNTER — Ambulatory Visit (INDEPENDENT_AMBULATORY_CARE_PROVIDER_SITE_OTHER): Payer: Medicare Other | Admitting: Family Medicine

## 2020-02-25 ENCOUNTER — Encounter: Payer: Self-pay | Admitting: Certified Nurse Midwife

## 2020-02-25 ENCOUNTER — Other Ambulatory Visit: Payer: Self-pay

## 2020-02-25 ENCOUNTER — Encounter: Payer: Self-pay | Admitting: Family Medicine

## 2020-02-25 VITALS — BP 134/63 | HR 77 | Temp 97.2°F | Resp 17 | Ht 67.0 in | Wt 300.0 lb

## 2020-02-25 DIAGNOSIS — Z1231 Encounter for screening mammogram for malignant neoplasm of breast: Secondary | ICD-10-CM

## 2020-02-25 DIAGNOSIS — Z7409 Other reduced mobility: Secondary | ICD-10-CM

## 2020-02-25 DIAGNOSIS — Z993 Dependence on wheelchair: Secondary | ICD-10-CM

## 2020-02-25 DIAGNOSIS — G35 Multiple sclerosis: Secondary | ICD-10-CM

## 2020-02-25 DIAGNOSIS — Z1211 Encounter for screening for malignant neoplasm of colon: Secondary | ICD-10-CM | POA: Diagnosis not present

## 2020-02-25 NOTE — Patient Instructions (Addendum)
     If you have lab work done today you will be contacted with your lab results within the next 2 weeks.  If you have not heard from [place] then please contact us. The fastest way to get your results is to register for My Chart. We recommend that you schedule a mammogram for breast cancer screening. Typically, you do not need a referral to do this. Please contact a local imaging center to schedule your mammogram.  [facility] - [phone]  *ask for the Radiology Department [facility] ([place]) - [phone] or [phone]  [facility] - [phone] [place] [phone] [facility] - [phone]  *ask for the [facility] - [phone]  *ask for the Radiology Department [facility] - [phone]  *ask for the [place] - [phone]   IF you received an x-ray today, you will receive an invoice from [facility] Radiology. Please contact [facility] Radiology at [phone] with questions or concerns regarding your invoice.   IF you received labwork today, you will receive an invoice from [facility]. Please contact [facility] at [phone] with questions or concerns regarding your invoice.   Our billing staff will not be able to assist you with questions regarding bills from these companies.  You will be contacted with the lab results as soon as they are available. The fastest way to get your results is to activate your My Chart account. Instructions are located on the last page of this paperwork. If you have not heard from [place] regarding the results in 2 weeks, please contact this office.

## 2020-02-26 ENCOUNTER — Ambulatory Visit (INDEPENDENT_AMBULATORY_CARE_PROVIDER_SITE_OTHER): Payer: Medicare Other | Admitting: Family Medicine

## 2020-02-26 VITALS — BP 134/63 | Ht 67.0 in | Wt 300.0 lb

## 2020-02-26 DIAGNOSIS — Z0000 Encounter for general adult medical examination without abnormal findings: Secondary | ICD-10-CM | POA: Diagnosis not present

## 2020-02-26 NOTE — Progress Notes (Signed)
Established Patient Office Visit  Subjective:  Patient ID: [name], female    DOB: 05-21-1961  Age: 59 y.o. MRN: [id number]  CC:  Chief Complaint  Patient presents with  . talk about her bed situation and form completion    HPI [name] presents for   Mobility difficulty Patient is here to get forms completed for SCAT as well as Handicap Placard She reports that she has been using her wheelchair but struggles to get out on her own.   She continues to struggle with the bed at home which is a twin bed that leads to discomfort and poor sleep. She states that she cannot turn without discomfort.  She was previously sleeping on the floor because it was easier. Her family is trying to get her the bed that she needs.    Multiple Sclerosis She reports that her right side is getting more numbness, pain and it was intermittent before lasting about 10 minutes. She spoke with Neurology before who recommended an MRI.  She did not want to get an MRI at that time. Now she is worrying she might be developing an MS flare because most of her symptoms during flares are on the right side.     Past Medical History:  Diagnosis Date  . Anemia    history of  . Arthritis   . Decubitus ulcers    currently treated at wound center  . Diabetes mellitus without complication ([place])   . Diabetic polyneuropathy ([place])   . Endometrial cancer ([facility])    Grade 2  . Fibroids   . GERD (gastroesophageal reflux disease)   . Heartburn    Occ  . Hypertension   . Mixed hyperlipidemia   . Morbid obesity ([place])   . MS (multiple sclerosis) ([place])     Past Surgical History:  Procedure Laterality Date  . COLONOSCOPY    . CYSTOSCOPY N/A 01/22/2020   Procedure: CYSTOSCOPY WITH SUPRA PUBIC TUBE CHANGE, BOTOX 200 UNITS, RETROGRADE PYELOGRAM BILATERAL. FULGERATION .5 CM-2CM;  Surgeon: Festus Aloe, MD;  Location: [facility];  Service: Urology;  Laterality: N/A;  . ESOPHAGOGASTRODUODENOSCOPY N/A 12/02/2017   Procedure: ESOPHAGOGASTRODUODENOSCOPY (EGD);  Surgeon: Ronnette Juniper, MD;  Location: [facility];  Service: Gastroenterology;  Laterality: N/A;  . ROBOTIC ASSISTED TOTAL HYSTERECTOMY WITH BILATERAL SALPINGO OOPHERECTOMY Bilateral 12/19/2018   Procedure: XI ROBOTIC ASSISTED TOTAL HYSTERECTOMY (UTERUS GREATER THAN 250gr) WITH BILATERAL SALPINGO OOPHORECTOMY;  Surgeon: Everitt Amber, MD;  Location: [facility];  Service: Gynecology;  Laterality: Bilateral;  . SENTINEL NODE BIOPSY N/A 12/19/2018   Procedure: SENTINEL NODE BIOPSY;  Surgeon: Everitt Amber, MD;  Location: [facility];  Service: Gynecology;  Laterality: N/A;  . SUPRAPUBIC CATHETER INSERTION  2009  . TONSILLECTOMY    . TUBAL LIGATION      Family History  Problem Relation Age of Onset  . Cancer Mother   . Hypertension Mother   . Heart disease Father   . Diabetes Father   . Hypertension Sister   . Hypertension Brother   . Breast cancer Neg Hx     Social History   Socioeconomic History  . Marital status: Single    Spouse name: Not on file  . Number of children: Not on file  . Years of education: Not on file  . Highest education level: Not on file  Occupational History  . Not on file  Tobacco Use  . Smoking status: Never Smoker  . Smokeless tobacco: Never Used  Substance and Sexual Activity  .  Alcohol use: No  . Drug use: No  . Sexual activity: Not on file  Other Topics Concern  . Not on file  Social History Narrative  . Not on file   Social Determinants of Health   Financial Resource Strain:   . Difficulty of Paying Living Expenses:   Food Insecurity:   . Worried About Charity fundraiser in the Last Year:   . Arboriculturist in the Last Year:   Transportation Needs:   . Film/video editor (Medical):   Marland Kitchen Lack of Transportation (Non-Medical):   Physical Activity:   . Days of Exercise per Week:   . Minutes of Exercise per Session:   Stress:   . Feeling of Stress :   Social Connections:   . Frequency of Communication  with Friends and Family:   . Frequency of Social Gatherings with Friends and Family:   . Attends Religious Services:   . Active Member of Clubs or Organizations:   . Attends Archivist Meetings:   Marland Kitchen Marital Status:   Intimate Partner Violence:   . Fear of Current or Ex-Partner:   . Emotionally Abused:   Marland Kitchen Physically Abused:   . Sexually Abused:     Outpatient Medications Prior to Visit  Medication Sig Dispense Refill  . atorvastatin (LIPITOR) 10 MG tablet Take 10 mg by mouth at bedtime.     . baclofen (LIORESAL) 20 MG tablet Take one po tid plus two po at bedtime (Patient taking differently: Take 20-40 mg by mouth See admin instructions. Take 1 tablet (20 mg) by mouth 3 times daily & take 2 tablet (40 mg) by mouth at bedtime.) 150 tablet 11  . Catheters (DOVER UNIVERSAL FOLEY TRAY) KIT Use foley tray kit to change catheter every 3 weeks or as needed 15 each 1  . Catheters (FOLEY CATHETER 2-WAY) MISC Use to aspirate urine suprapubic 15 each 1  . Cholecalciferol (D3-1000) 1000 units capsule Take 1,000 Units by mouth 2 (two) times daily. MORNING & AFTERNOON.    . furosemide (LASIX) 20 MG tablet TAKE 1 TABLET BY MOUTH ONCE DAILY (Patient taking differently: Take 20 mg by mouth daily. ) 90 tablet 3  . glipiZIDE (GLUCOTROL) 5 MG tablet Take 1 tablet (5 mg total) by mouth daily before lunch. (Patient taking differently: Take 5 mg by mouth daily. ) 90 tablet 3  . hydrochlorothiazide (HYDRODIURIL) 25 MG tablet TAKE 1 TABLET BY MOUTH ONCE DAILY EVERY MORNING (Patient taking differently: Take 25 mg by mouth daily. ) 90 tablet 0  . hydrocortisone (ANUSOL-HC) 2.5 % rectal cream Place 1 application rectally 2 (two) times daily. (Patient taking differently: Place 1 application rectally 2 (two) times daily as needed (irritation/discomfort). ) 30 g 0  . hydrocortisone (ANUSOL-HC) 25 MG suppository Place 1 suppository (25 mg total) rectally 2 (two) times daily. (Patient taking differently: Place 25  mg rectally 2 (two) times daily as needed. ) 12 suppository 0  . hydrocortisone 2.5 % cream Place rectally.    . Incontinence Supply Disposable (TENA FLEX 16 PLUS) MISC Use daily as needed 30 each 11  . Incontinence Supply Disposable (TENA PROTECT UNDERWEAR PLS/XLG) MISC Use for incontinence. 30 each 11  . Interferon Beta-1b (BETASERON) 0.3 MG KIT injection Inject 0.3 mg into the skin every other day. (Patient taking differently: Inject 0.3 mg into the skin every other day. In the evening.) 36 each 3  . metFORMIN (GLUCOPHAGE) 500 MG tablet Take 500 mg by mouth 2 (  two) times daily.  3  . MOVANTIK 25 MG TABS tablet Take 3 tablets (75 mg total) by mouth daily. (Patient taking differently: Take 75 mg by mouth every other day. In the morning) 90 tablet 4  . NON FORMULARY Power wheelchair repairs  Diagnosis code z99.3 1 each 0  . NON FORMULARY Dispense catheter urine drainage bags 90 Bag 3  . nystatin ointment (MYCOSTATIN) Apply to affected area twice daily as needed for irritation (Patient taking differently: Apply 1 application topically 2 (two) times daily as needed (irritation.). ) 30 g 6  . Ostomy Supplies (NEW IMAGE SKIN/FLANGE/TAPE) MISC 1 application by Does not apply route daily as needed. 30 each 11  . oxyCODONE-acetaminophen (PERCOCET) 10-325 MG tablet Take 1 tablet by mouth every 8 (eight) hours as needed for pain. 90 tablet 0  . potassium chloride (KLOR-CON) 10 MEQ tablet     . potassium chloride (MICRO-K) 10 MEQ CR capsule Take 30 mEq by mouth 2 (two) times daily.     . promethazine (PHENERGAN) 25 MG tablet Take 1 tablet (25 mg total) by mouth every 6 (six) hours as needed for nausea or vomiting. 45 tablet 0  . senna-docusate (SENOKOT-S) 8.6-50 MG tablet Take 2 tablets by mouth at bedtime. Do not take if having loose stools (Patient taking differently: Take 2 tablets by mouth at bedtime as needed (constipation.). Do not take if having loose stools) 30 tablet 1  . Syringe, Disposable, (B-D  SYRINGE LUER-LOK 30CC) 30 ML MISC Use to change catheter 15 each 0  . vitamin B-12 (CYANOCOBALAMIN) 500 MCG tablet Take 500 mcg by mouth daily.    . Vitamin D, Ergocalciferol, (DRISDOL) 1.25 MG (50000 UNIT) CAPS capsule Take one cap every 7 days for 26 weeks then start OTC 5000 units per day. (Patient taking differently: Take 50,000 Units by mouth every Sunday. Take one cap every 7 days for 26 weeks then start OTC 5000 units per day.) 13 capsule 1   Facility-Administered Medications Prior to Visit  Medication Dose Route Frequency Provider Last Rate Last Admin  . botulinum toxin Type A (BOTOX) injection 200 Units  200 Units Intramuscular Once Festus Aloe, MD        No Known Allergies  ROS Review of Systems Review of Systems  Constitutional: Negative for activity change, appetite change, chills and fever.  HENT: Negative for congestion, nosebleeds, trouble swallowing and voice change.   Respiratory: Negative for cough, shortness of breath and wheezing.   Gastrointestinal: Negative for diarrhea, nausea and vomiting.  Genitourinary: uses chronic indwelling foley catheter.  Musculoskeletal: Negative for back pain, joint swelling and neck pain.  Neurological: Negative for dizziness, speech difficulty, light-headedness.  See hpi. See HPI. All other review of systems negative.     Objective:    Physical Exam  BP 134/63 (BP Location: Left Arm, Patient Position: Sitting, Cuff Size: Large)   Pulse 77   Temp (!) 97.2 F (36.2 C) (Temporal)   Resp 17   Ht '5\' 7"'$  (1.702 m)   Wt 300 lb (136.1 kg)   SpO2 96%   BMI 46.99 kg/m  Wt Readings from Last 3 Encounters:  02/25/20 300 lb (136.1 kg)  01/22/20 291 lb 1.6 oz (132 kg)  01/17/20 300 lb (136.1 kg)   Physical Exam  Constitutional: Oriented to person, place, and time. Appears well-developed with morbid obesity.  Wheelchair bound. HENT:  Head: Normocephalic and atraumatic.  Eyes: Conjunctivae and EOM are normal.  Cardiovascular:  Normal rate, regular rhythm, normal  heart sounds and intact distal pulses.  No murmur heard. Pulmonary/Chest: Effort normal and breath sounds normal. No stridor. No respiratory distress. Has no wheezes.  Neurological: Is alert and oriented to person, place, and time.  Skin: Skin is warm. Capillary refill takes less than 2 seconds.  Psychiatric: Has a normal mood and affect. Behavior is normal. Judgment and thought content normal.    Health Maintenance Due  Topic Date Due  . PNEUMOCOCCAL POLYSACCHARIDE VACCINE AGE 52-64 HIGH RISK  Never done  . OPHTHALMOLOGY EXAM  Never done  . HIV Screening  Never done  . COLONOSCOPY  Never done  . URINE MICROALBUMIN  12/15/2017  . INFLUENZA VACCINE  07/07/2019  . FOOT EXAM  07/20/2019    There are no preventive care reminders to display for this patient.  Lab Results  Component Value Date   TSH 1.140 12/19/2019   Lab Results  Component Value Date   WBC 6.7 01/17/2020   HGB 10.2 (L) 01/17/2020   HCT 34.3 (L) 01/17/2020   MCV 85.8 01/17/2020   PLT 446 (H) 01/17/2020   Lab Results  Component Value Date   NA 139 01/17/2020   K 3.4 (L) 01/17/2020   CO2 30 01/17/2020   GLUCOSE 159 (H) 01/17/2020   BUN 13 01/17/2020   CREATININE 0.82 01/17/2020   BILITOT <0.2 12/19/2019   ALKPHOS 74 12/19/2019   AST 9 12/19/2019   ALT 8 12/19/2019   PROT 6.9 12/19/2019   ALBUMIN 4.2 12/19/2019   CALCIUM 9.0 01/17/2020   ANIONGAP 9 01/17/2020   Lab Results  Component Value Date   CHOL 151 06/27/2019   Lab Results  Component Value Date   HDL 62 06/27/2019   Lab Results  Component Value Date   LDLCALC 72 06/27/2019   Lab Results  Component Value Date   TRIG 83 06/27/2019   Lab Results  Component Value Date   CHOLHDL 2.4 06/27/2019   Lab Results  Component Value Date   HGBA1C 6.9 (H) 12/19/2019      Assessment & Plan:   Problem List Items Addressed This Visit      Nervous and Auditory   Multiple sclerosis ([place]) (Chronic)  -   Messaged Neurology and discussed following up for MRI due to her worsening neuro symptoms.    Other Visit Diagnoses    Screening for colon cancer    -  Primary   Relevant Orders   Cologuard   Encounter for screening mammogram for malignant neoplasm of breast       Relevant Orders   MM Digital Screening   Wheelchair bound    -  Completed SCAT and DMV form   Does not transfer location    -  Patient needs a better fitting bed      No orders of the defined types were placed in this encounter.   Follow-up: No follow-ups on file.    Forrest Moron, MD

## 2020-02-26 NOTE — Patient Instructions (Addendum)
Thank you for taking time to come for your Medicare Wellness Visit. I appreciate your ongoing commitment to your health goals. Please review the following plan we discussed and let me know if I can assist you in the future.  Leroy Kennedy LPN  reventive Care 85-02 Years Old, Female Preventive care refers to visits with your health care provider and lifestyle choices that can promote health and wellness. This includes:  A yearly physical exam. This may also be called an annual well check.  Regular dental visits and eye exams.  Immunizations.  Screening for certain conditions.  Healthy lifestyle choices, such as eating a healthy diet, getting regular exercise, not using drugs or products that contain nicotine and tobacco, and limiting alcohol use. What can I expect for my preventive care visit? Physical exam Your health care provider will check your:  Height and weight. This may be used to calculate body mass index (BMI), which tells if you are at a healthy weight.  Heart rate and blood pressure.  Skin for abnormal spots. Counseling Your health care provider may ask you questions about your:  Alcohol, tobacco, and drug use.  Emotional well-being.  Home and relationship well-being.  Sexual activity.  Eating habits.  Work and work Statistician.  Method of birth control.  Menstrual cycle.  Pregnancy history. What immunizations do I need?  Influenza (flu) vaccine  This is recommended every year. Tetanus, diphtheria, and pertussis (Tdap) vaccine  You may need a Td booster every 10 years. Varicella (chickenpox) vaccine  You may need this if you have not been vaccinated. Zoster (shingles) vaccine  You may need this after age 49. Measles, mumps, and rubella (MMR) vaccine  You may need at least one dose of MMR if you were born in 1957 or later. You may also need a second dose. Pneumococcal conjugate (PCV13) vaccine  You may need this if you have certain conditions  and were not previously vaccinated. Pneumococcal polysaccharide (PPSV23) vaccine  You may need one or two doses if you smoke cigarettes or if you have certain conditions. Meningococcal conjugate (MenACWY) vaccine  You may need this if you have certain conditions. Hepatitis A vaccine  You may need this if you have certain conditions or if you travel or work in places where you may be exposed to hepatitis A. Hepatitis B vaccine  You may need this if you have certain conditions or if you travel or work in places where you may be exposed to hepatitis B. Haemophilus influenzae type b (Hib) vaccine  You may need this if you have certain conditions. Human papillomavirus (HPV) vaccine  If recommended by your health care provider, you may need three doses over 6 months. You may receive vaccines as individual doses or as more than one vaccine together in one shot (combination vaccines). Talk with your health care provider about the risks and benefits of combination vaccines. What tests do I need? Blood tests  Lipid and cholesterol levels. These may be checked every 5 years, or more frequently if you are over 29 years old.  Hepatitis C test.  Hepatitis B test. Screening  Lung cancer screening. You may have this screening every year starting at age 32 if you have a 30-pack-year history of smoking and currently smoke or have quit within the past 15 years.  Colorectal cancer screening. All adults should have this screening starting at age 54 and continuing until age 30. Your health care provider may recommend screening at age 65 if you are  at increased risk. You will have tests every 1-10 years, depending on your results and the type of screening test.  Diabetes screening. This is done by checking your blood sugar (glucose) after you have not eaten for a while (fasting). You may have this done every 1-3 years.  Mammogram. This may be done every 1-2 years. Talk with your health care provider  about when you should start having regular mammograms. This may depend on whether you have a family history of breast cancer.  BRCA-related cancer screening. This may be done if you have a family history of breast, ovarian, tubal, or peritoneal cancers.  Pelvic exam and Pap test. This may be done every 3 years starting at age 28. Starting at age 45, this may be done every 5 years if you have a Pap test in combination with an HPV test. Other tests  Sexually transmitted disease (STD) testing.  Bone density scan. This is done to screen for osteoporosis. You may have this scan if you are at high risk for osteoporosis. Follow these instructions at home: Eating and drinking  Eat a diet that includes fresh fruits and vegetables, whole grains, lean protein, and low-fat dairy.  Take vitamin and mineral supplements as recommended by your health care provider.  Do not drink alcohol if: ? Your health care provider tells you not to drink. ? You are pregnant, may be pregnant, or are planning to become pregnant.  If you drink alcohol: ? Limit how much you have to 0-1 drink a day. ? Be aware of how much alcohol is in your drink. In the [place] one drink equals one 12 oz bottle of beer (355 mL), one 5 oz glass of wine (148 mL), or one 1 oz glass of hard liquor (44 mL). Lifestyle  Take daily care of your teeth and gums.  Stay active. Exercise for at least 30 minutes on 5 or more days each week.  Do not use any products that contain nicotine or tobacco, such as cigarettes, e-cigarettes, and chewing tobacco. If you need help quitting, ask your health care provider.  If you are sexually active, practice safe sex. Use a condom or other form of birth control (contraception) in order to prevent pregnancy and STIs (sexually transmitted infections).  If told by your health care provider, take low-dose aspirin daily starting at age 67. What's next?  Visit your health care provider once a year for a well  check visit.  Ask your health care provider how often you should have your eyes and teeth checked.  Stay up to date on all vaccines. This information is not intended to replace advice given to you by your health care provider. Make sure you discuss any questions you have with your health care provider. Document Revised: 08/03/2018 Document Reviewed: 08/03/2018 Elsevier Patient Education  2020 [employer].

## 2020-02-26 NOTE — Progress Notes (Signed)
Presents today for [employer] Visit   Date of last exam: 02/25/2020  Interpreter used for this visit?  No  I connected with  Daniqua Campoy on 02/26/20 by a telephone telemedicine and verified that I am speaking with the correct person using two identifiers.   I discussed the limitations of evaluation and management by telemedicine. The patient expressed understanding and agreed to proceed.   Patient Care Team: Forrest Moron, MD as PCP - General (Internal Medicine) Festus Aloe, MD as Consulting Physician (Urology) Sater, Nanine Means, MD (Neurology) Amalia Greenhouse, MD as Referring Physician (Endocrinology)   Other items to address today:   Discussed immunizations Discussed Eye/Dental  Follow scheduled 8-4 Dr. Henri Medal   Other Screening: Last screening for diabetes: 01/22/2020 Last lipid screening: 06/27/2019  ADVANCE DIRECTIVES: Discussed: yes On File: no Materials Provided:  yes  Immunization status:  Immunization History  Administered Date(s) Administered  . Tdap 01/09/2020     Health Maintenance Due  Topic Date Due  . PNEUMOCOCCAL POLYSACCHARIDE VACCINE AGE 56-64 HIGH RISK  Never done  . OPHTHALMOLOGY EXAM  Never done  . HIV Screening  Never done  . COLONOSCOPY  Never done  . URINE MICROALBUMIN  12/15/2017  . FOOT EXAM  07/20/2019     Functional Status Survey: Is the patient deaf or have difficulty hearing?: No Does the patient have difficulty seeing, even when wearing glasses/contacts?: No Does the patient have difficulty concentrating, remembering, or making decisions?: No Does the patient have difficulty walking or climbing stairs?: Yes Does the patient have difficulty dressing or bathing?: No(wheelchairs) Does the patient have difficulty doing errands alone such as visiting a doctor's office or shopping?: No   6CIT Screen 02/26/2020 10/07/2017  What Year? 0 points 0 points  What month? 0 points 0 points  What time? 0 points 0  points  Count back from 20 0 points 0 points  Months in reverse 0 points 0 points  Repeat phrase 2 points 0 points  Total Score 2 0        Clinical Support from 02/26/2020 in [facility]  AUDIT-C Score  0       Home Environment:   Lives in one story home with daughter No scattered rugs Yes grab bars No clutter/adequate lighting Patient is wheelchair bond  Cannot climb stairs   Patient Active Problem List   Diagnosis Date Noted  . Endometrial cancer ([place]) 11/21/2018  . Morbid obesity ([place]) 11/21/2018  . Pain management contract agreement 02/16/2017  . Mixed hyperlipidemia 11/24/2016  . Type 2 diabetes mellitus with diabetic polyneuropathy, without long-term current use of insulin ([place]) 11/24/2016  . Multiple sclerosis ([place]) 11/18/2016  . Leg weakness, bilateral 11/18/2016  . Dysesthesia 11/18/2016  . Diplegia of both lower extremities ([place]) 11/18/2016  . Diabetes, polyneuropathy ([place]) 11/18/2016  . Chronic indwelling Foley catheter 11/17/2016  . Essential hypertension 11/17/2016  . Slow transit constipation 11/17/2016  . Chronically on opiate therapy 11/17/2016  . Physical deconditioning 11/17/2016     Past Medical History:  Diagnosis Date  . Anemia    history of  . Arthritis   . Decubitus ulcers    currently treated at wound center  . Diabetes mellitus without complication ([place])   . Diabetic polyneuropathy ([place])   . Endometrial cancer ([facility])    Grade 2  . Fibroids   . GERD (gastroesophageal reflux disease)   . Heartburn    Occ  . Hypertension   . Mixed  hyperlipidemia   . Morbid obesity ([place])   . MS (multiple sclerosis) ([place])      Past Surgical History:  Procedure Laterality Date  . COLONOSCOPY    . CYSTOSCOPY N/A 01/22/2020   Procedure: CYSTOSCOPY WITH SUPRA PUBIC TUBE CHANGE, BOTOX 200 UNITS, RETROGRADE PYELOGRAM BILATERAL. FULGERATION .5 CM-2CM;  Surgeon: Festus Aloe, MD;  Location: [facility];  Service: Urology;  Laterality: N/A;  .  ESOPHAGOGASTRODUODENOSCOPY N/A 12/02/2017   Procedure: ESOPHAGOGASTRODUODENOSCOPY (EGD);  Surgeon: Ronnette Juniper, MD;  Location: [facility];  Service: Gastroenterology;  Laterality: N/A;  . ROBOTIC ASSISTED TOTAL HYSTERECTOMY WITH BILATERAL SALPINGO OOPHERECTOMY Bilateral 12/19/2018   Procedure: XI ROBOTIC ASSISTED TOTAL HYSTERECTOMY (UTERUS GREATER THAN 250gr) WITH BILATERAL SALPINGO OOPHORECTOMY;  Surgeon: Everitt Amber, MD;  Location: [facility];  Service: Gynecology;  Laterality: Bilateral;  . SENTINEL NODE BIOPSY N/A 12/19/2018   Procedure: SENTINEL NODE BIOPSY;  Surgeon: Everitt Amber, MD;  Location: [facility];  Service: Gynecology;  Laterality: N/A;  . SUPRAPUBIC CATHETER INSERTION  2009  . TONSILLECTOMY    . TUBAL LIGATION       Family History  Problem Relation Age of Onset  . Cancer Mother   . Hypertension Mother   . Heart disease Father   . Diabetes Father   . Hypertension Sister   . Hypertension Brother   . Breast cancer Neg Hx      Social History   Socioeconomic History  . Marital status: Single    Spouse name: Not on file  . Number of children: Not on file  . Years of education: Not on file  . Highest education level: Not on file  Occupational History  . Not on file  Tobacco Use  . Smoking status: Never Smoker  . Smokeless tobacco: Never Used  Substance and Sexual Activity  . Alcohol use: No  . Drug use: No  . Sexual activity: Not on file  Other Topics Concern  . Not on file  Social History Narrative  . Not on file   Social Determinants of Health   Financial Resource Strain:   . Difficulty of Paying Living Expenses:   Food Insecurity:   . Worried About Charity fundraiser in the Last Year:   . Arboriculturist in the Last Year:   Transportation Needs:   . Film/video editor (Medical):   Marland Kitchen Lack of Transportation (Non-Medical):   Physical Activity:   . Days of Exercise per Week:   . Minutes of Exercise per Session:   Stress:   . Feeling of Stress :     Social Connections:   . Frequency of Communication with Friends and Family:   . Frequency of Social Gatherings with Friends and Family:   . Attends Religious Services:   . Active Member of Clubs or Organizations:   . Attends Archivist Meetings:   Marland Kitchen Marital Status:   Intimate Partner Violence:   . Fear of Current or Ex-Partner:   . Emotionally Abused:   Marland Kitchen Physically Abused:   . Sexually Abused:      No Known Allergies   Prior to Admission medications   Medication Sig Start Date End Date Taking? Authorizing Provider  atorvastatin (LIPITOR) 10 MG tablet Take 10 mg by mouth at bedtime.    Yes [provider]  baclofen (LIORESAL) 20 MG tablet Take one po tid plus two po at bedtime Patient taking differently: Take 20-40 mg by mouth See admin instructions. Take 1 tablet (20 mg) by mouth  3 times daily & take 2 tablet (40 mg) by mouth at bedtime. 12/19/19  Yes Sater, Nanine Means, MD  Catheters (DOVER UNIVERSAL FOLEY TRAY) KIT Use foley tray kit to change catheter every 3 weeks or as needed 12/15/16  Yes Stallings, Zoe A, MD  Catheters (FOLEY CATHETER 2-WAY) MISC Use to aspirate urine suprapubic 12/15/16  Yes Stallings, Zoe A, MD  Cholecalciferol (D3-1000) 1000 units capsule Take 1,000 Units by mouth 2 (two) times daily. MORNING & AFTERNOON.   Yes [provider]  furosemide (LASIX) 20 MG tablet TAKE 1 TABLET BY MOUTH ONCE DAILY Patient taking differently: Take 20 mg by mouth daily.  10/20/19  Yes Stallings, Zoe A, MD  glipiZIDE (GLUCOTROL) 5 MG tablet Take 1 tablet (5 mg total) by mouth daily before lunch. Patient taking differently: Take 5 mg by mouth daily.  05/11/17  Yes Stallings, Zoe A, MD  hydrochlorothiazide (HYDRODIURIL) 25 MG tablet TAKE 1 TABLET BY MOUTH ONCE DAILY EVERY MORNING Patient taking differently: Take 25 mg by mouth daily.  07/23/19  Yes Stallings, Zoe A, MD  hydrocortisone (ANUSOL-HC) 2.5 % rectal cream Place 1 application rectally 2 (two) times  daily. Patient taking differently: Place 1 application rectally 2 (two) times daily as needed (irritation/discomfort).  02/20/19  Yes Gery Pray, MD  hydrocortisone (ANUSOL-HC) 25 MG suppository Place 1 suppository (25 mg total) rectally 2 (two) times daily. Patient taking differently: Place 25 mg rectally 2 (two) times daily as needed.  02/27/19  Yes Gery Pray, MD  hydrocortisone 2.5 % cream Place rectally. 02/20/19  Yes [provider]  Incontinence Supply Disposable (TENA FLEX 16 PLUS) MISC Use daily as needed 07/19/18  Yes Forrest Moron, MD  Incontinence Supply Disposable (TENA PROTECT UNDERWEAR PLS/XLG) MISC Use for incontinence. 07/19/18  Yes Forrest Moron, MD  Interferon Beta-1b (BETASERON) 0.3 MG KIT injection Inject 0.3 mg into the skin every other day. Patient taking differently: Inject 0.3 mg into the skin every other day. In the evening. 12/19/19  Yes Sater, Nanine Means, MD  metFORMIN (GLUCOPHAGE) 500 MG tablet Take 500 mg by mouth 2 (two) times daily. 10/17/18  Yes [provider]  MOVANTIK 25 MG TABS tablet Take 3 tablets (75 mg total) by mouth daily. Patient taking differently: Take 75 mg by mouth every other day. In the morning 01/09/20  Yes Forrest Moron, MD  NON FORMULARY Power wheelchair repairs  Diagnosis code z99.3 04/13/17  Yes Forrest Moron, MD  NON FORMULARY Dispense catheter urine drainage bags 07/19/18  Yes Stallings, Zoe A, MD  nystatin ointment (MYCOSTATIN) Apply to affected area twice daily as needed for irritation Patient taking differently: Apply 1 application topically 2 (two) times daily as needed (irritation.).  01/04/18  Yes Forrest Moron, MD  Ostomy Supplies (NEW IMAGE SKIN/FLANGE/TAPE) MISC 1 application by Does not apply route daily as needed. 07/19/18  Yes Forrest Moron, MD  oxyCODONE-acetaminophen (PERCOCET) 10-325 MG tablet Take 1 tablet by mouth every 8 (eight) hours as needed for pain. 02/12/20  Yes Forrest Moron, MD    potassium chloride (KLOR-CON) 10 MEQ tablet  02/05/20  Yes [provider]  potassium chloride (MICRO-K) 10 MEQ CR capsule Take 30 mEq by mouth 2 (two) times daily.  01/07/20  Yes [provider]  promethazine (PHENERGAN) 25 MG tablet Take 1 tablet (25 mg total) by mouth every 6 (six) hours as needed for nausea or vomiting. 02/16/19  Yes Eppie Gibson, MD  senna-docusate (SENOKOT-S) 8.6-50 MG  tablet Take 2 tablets by mouth at bedtime. Do not take if having loose stools Patient taking differently: Take 2 tablets by mouth at bedtime as needed (constipation.). Do not take if having loose stools 12/20/18  Yes Cross, Melissa D, NP  Syringe, Disposable, (B-D SYRINGE LUER-LOK 30CC) 30 ML MISC Use to change catheter 12/15/16  Yes Stallings, Zoe A, MD  vitamin B-12 (CYANOCOBALAMIN) 500 MCG tablet Take 500 mcg by mouth daily.   Yes [provider]  Vitamin D, Ergocalciferol, (DRISDOL) 1.25 MG (50000 UNIT) CAPS capsule Take one cap every 7 days for 26 weeks then start OTC 5000 units per day. Patient taking differently: Take 50,000 Units by mouth every Sunday. Take one cap every 7 days for 26 weeks then start OTC 5000 units per day. 12/25/19  Yes Sater, Nanine Means, MD     Depression screen [facility] 2/9 02/25/2020 01/09/2020 11/28/2019 06/27/2019 11/10/2018  Decreased Interest 0 0 0 0 0  Down, Depressed, Hopeless 0 0 0 0 0  PHQ - 2 Score 0 0 0 0 0  Altered sleeping - - - - 0  Tired, decreased energy - - - - 0  Change in appetite - - - - 0  Feeling bad or failure about yourself  - - - - 0  Trouble concentrating - - - - 0  Moving slowly or fidgety/restless - - - - 0  Suicidal thoughts - - - - 0  PHQ-9 Score - - - - 0     Fall Risk  02/26/2020 02/25/2020 01/09/2020 11/28/2019 06/27/2019  Falls in the past year? 0 0 0 0 0  Number falls in past yr: 0 0 0 0 0  Injury with Fall? 0 0 0 0 0  Follow up Falls evaluation completed;Education provided Falls evaluation completed Falls evaluation completed  Falls evaluation completed -      PHYSICAL EXAM: BP 134/63 Comment: taken from a previous visit  Ht '5\' 7"'$  (1.702 m)   Wt 300 lb (136.1 kg)   BMI 46.99 kg/m    Wt Readings from Last 3 Encounters:  02/26/20 300 lb (136.1 kg)  02/25/20 300 lb (136.1 kg)  01/22/20 291 lb 1.6 oz (132 kg)       Education/Counseling provided regarding diet and exercise, prevention of chronic diseases, smoking/tobacco cessation, if applicable, and reviewed "Covered Medicare Preventive Services."

## 2020-02-28 ENCOUNTER — Other Ambulatory Visit: Payer: Self-pay | Admitting: Neurology

## 2020-02-28 DIAGNOSIS — G35 Multiple sclerosis: Secondary | ICD-10-CM

## 2020-03-03 ENCOUNTER — Telehealth: Payer: Self-pay | Admitting: Neurology

## 2020-03-03 NOTE — Telephone Encounter (Signed)
Medicare/medicaid order sent to [facility]. No auth they will reach out to the patient to schedule.  °

## 2020-03-04 DIAGNOSIS — Z1211 Encounter for screening for malignant neoplasm of colon: Secondary | ICD-10-CM | POA: Diagnosis not present

## 2020-03-07 ENCOUNTER — Encounter (HOSPITAL_BASED_OUTPATIENT_CLINIC_OR_DEPARTMENT_OTHER): Payer: Medicare Other | Admitting: Internal Medicine

## 2020-03-08 LAB — EXTERNAL GENERIC LAB PROCEDURE: COLOGUARD: NEGATIVE

## 2020-03-11 LAB — COLOGUARD: Cologuard: NEGATIVE

## 2020-03-12 ENCOUNTER — Encounter (HOSPITAL_BASED_OUTPATIENT_CLINIC_OR_DEPARTMENT_OTHER): Payer: Medicare Other | Attending: Internal Medicine | Admitting: Physician Assistant

## 2020-03-12 ENCOUNTER — Other Ambulatory Visit: Payer: Self-pay

## 2020-03-12 DIAGNOSIS — Z993 Dependence on wheelchair: Secondary | ICD-10-CM | POA: Insufficient documentation

## 2020-03-12 DIAGNOSIS — G35 Multiple sclerosis: Secondary | ICD-10-CM | POA: Insufficient documentation

## 2020-03-12 DIAGNOSIS — L89313 Pressure ulcer of right buttock, stage 3: Secondary | ICD-10-CM | POA: Insufficient documentation
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
## 2020-03-13 ENCOUNTER — Other Ambulatory Visit: Payer: Self-pay | Admitting: Family Medicine

## 2020-03-13 DIAGNOSIS — Z79891 Long term (current) use of opiate analgesic: Secondary | ICD-10-CM

## 2020-03-13 DIAGNOSIS — G35 Multiple sclerosis: Secondary | ICD-10-CM

## 2020-03-13 NOTE — Telephone Encounter (Signed)
Patient is requesting a refill of the following medications: Requested Prescriptions   Pending Prescriptions Disp Refills   oxyCODONE-acetaminophen (PERCOCET) 10-325 MG tablet 90 tablet 0    Sig: Take 1 tablet by mouth every 8 (eight) hours as needed for pain.    Date of patient request: 03/13/2020 Last office visit: 03/12/2020 Date of last refill: 02/12/2020 Last refill amount: 90 tablets

## 2020-03-13 NOTE — Telephone Encounter (Signed)
Requested medication (s) are due for refill today: Yes  Requested medication (s) are on the active medication list: Yes  Last refill:  02/12/20  Future visit scheduled: Yes  Notes to clinic:  See request.    Requested Prescriptions  Pending Prescriptions Disp Refills   oxyCODONE-acetaminophen (PERCOCET) 10-325 MG tablet 90 tablet 0    Sig: Take 1 tablet by mouth every 8 (eight) hours as needed for pain.      Not Delegated - Analgesics:  Opioid Agonist Combinations Failed - 03/13/2020  9:52 AM      Failed - This refill cannot be delegated      Failed - Urine Drug Screen completed in last 360 days.      Passed - Valid encounter within last 6 months    Recent Outpatient Visits           2 weeks ago Medicare annual wellness visit, subsequent   Primary Care at [facility], [name], MD   2 weeks ago Screening for colon cancer   Primary Care at [facility], [place] A, MD   2 months ago Type 2 diabetes mellitus with diabetic polyneuropathy, without long-term current use of insulin ([place])   Primary Care at [facility], [name], MD   3 months ago Chronically on opiate therapy   Primary Care at [facility], [name], MD   8 months ago Multiple sclerosis [facility])   Primary Care at [place], MD       Future Appointments             In 3 months Forrest Moron, MD Primary Care at [place], [facility]

## 2020-03-13 NOTE — Telephone Encounter (Signed)
Copied from [facility] [id number] [id number]. Topic: Quick Communication - Rx Refill/Question >> Mar 13, 2020  9:49 AM [name] wrote: Medication: oxyCODONE-acetaminophen (PERCOCET) 10-325 MG tablet  Has the patient contacted their pharmacy?Yes (Agent: If no, request that the patient contact the pharmacy for the refill.) (Agent: If yes, when and what did the pharmacy advise?)Contact PCP  Preferred Pharmacy (with phone number or street name): [facility], [place]  Phone:  [phone] Fax:  [phone]     Agent: Please be advised that RX refills may take up to 3 business days. We ask that you follow-up with your pharmacy.

## 2020-03-14 DIAGNOSIS — N318 Other neuromuscular dysfunction of bladder: Secondary | ICD-10-CM | POA: Diagnosis not present

## 2020-03-14 MED ORDER — OXYCODONE-ACETAMINOPHEN 10-325 MG PO TABS: 1.0000 | ORAL_TABLET | Freq: Three times a day (TID) | ORAL | 0 refills | Status: DC | PRN

## 2020-03-18 ENCOUNTER — Other Ambulatory Visit: Payer: Self-pay

## 2020-03-18 ENCOUNTER — Ambulatory Visit
Admission: RE | Admit: 2020-03-18 | Discharge: 2020-03-18 | Disposition: A | Discharge status: Home / Self Care | Payer: Medicare Other | Source: Ambulatory Visit | Attending: Family Medicine | Admitting: Family Medicine

## 2020-03-18 DIAGNOSIS — Z1231 Encounter for screening mammogram for malignant neoplasm of breast: Secondary | ICD-10-CM

## 2020-03-19 NOTE — Progress Notes (Signed)
SICILIA, KILLOUGH ([id number]) Visit Report for 03/12/2020 Arrival Information Details Patient Name: Date of Service: [name], [name] 03/12/2020 2:15 PM Medical Record [id number] Patient Account Number: [id number] Date of Birth/Sex: Treating RN: [birth year] (59 y.o. Martyn Malay, Linda Primary Care Provider: Delia Chimes Other Clinician: Referring Provider: Treating Provider/Extender:Stone III, Philis Nettle, ZOE Weeks in Treatment: 160 Visit Information History Since Last Visit Added or deleted any medications: No Patient Arrived: Wheel Chair Any new allergies or adverse reactions: No Arrival Time: 14:04 Had a fall or experienced change in No activities of daily living that may affect Accompanied By: self risk of falls: Transfer Assistance: None Signs or symptoms of abuse/neglect since last No Patient Identification Verified: Yes visito Secondary Verification Process Completed: Yes Hospitalized since last visit: No Patient Requires Transmission-Based No Implantable device outside of the clinic excluding No Precautions: cellular tissue based products placed in the center Patient Has Alerts: No since last visit: Has Dressing in Place as Prescribed: Yes Pain Present Now: Yes Electronic Signature(s) Signed: 03/19/2020 9:20:10 AM By: Sandre Kitty Entered By: Sandre Kitty on 03/12/2020 14:05:16 -------------------------------------------------------------------------------- Clinic Level of Care Assessment Details Patient Name: Date of Service: [name], [name] 03/12/2020 2:15 PM Medical Record [id number] Patient Account Number: [id number] Date of Birth/Sex: Treating RN: 13-Dec-1960 (59 y.o. Elam Dutch Primary Care Provider: Delia Chimes Other Clinician: Referring Provider: Treating Provider/Extender:Stone III, Philis Nettle, ZOE Weeks in Treatment: 160 Clinic Level of Care Assessment Items TOOL 4 Quantity Score [] - Use when only an EandM is performed on  FOLLOW-UP visit 0 ASSESSMENTS - Nursing Assessment / Reassessment X - Reassessment of Co-morbidities (includes updates in patient status) 1 10 X - Reassessment of Adherence to Treatment Plan 1 5 ASSESSMENTS - Wound and Skin Assessment / Reassessment X - Simple Wound Assessment / Reassessment - one wound 1 5 [] - Complex Wound Assessment / Reassessment - multiple wounds 0 [] - Dermatologic / Skin Assessment (not related to wound area) 0 ASSESSMENTS - Focused Assessment [] - Circumferential Edema Measurements - multi extremities 0 [] - Nutritional Assessment / Counseling / Intervention 0 [] - Lower Extremity Assessment (monofilament, tuning fork, pulses) 0 [] - Peripheral Arterial Disease Assessment (using hand held doppler) 0 ASSESSMENTS - Ostomy and/or Continence Assessment and Care [] - Incontinence Assessment and Management 0 [] - Ostomy Care Assessment and Management (repouching, etc.) 0 PROCESS - Coordination of Care X - Simple Patient / Family Education for ongoing care 1 15 [] - Complex (extensive) Patient / Family Education for ongoing care 0 X - Staff obtains Programmer, systems, Records, Test Results / Process Orders 1 10 [] - Staff telephones HHA, Nursing Homes / Clarify orders / etc 0 [] - Routine Transfer to another Facility (non-emergent condition) 0 [] - Routine Hospital Admission (non-emergent condition) 0 [] - New Admissions / Biomedical engineer / Ordering NPWT, Apligraf, etc. 0 [] - Emergency Hospital Admission (emergent condition) 0 X - Simple Discharge Coordination 1 10 [] - Complex (extensive) Discharge Coordination 0 PROCESS - Special Needs [] - Pediatric / Minor Patient Management 0 [] - Isolation Patient Management 0 [] - Hearing / Language / Visual special needs 0 [] - Assessment of Community assistance (transportation, D/C planning, etc.) 0 [] - Additional assistance / Altered mentation 0 [] - Support Surface(s) Assessment (bed, cushion, seat, etc.)  0 INTERVENTIONS - Wound Cleansing / Measurement X - Simple Wound Cleansing - one wound 1 5 [] - Complex Wound Cleansing - multiple wounds 0 X - Wound Imaging (photographs - any  number of wounds) 1 5 [] - Wound Tracing (instead of photographs) 0 X - Simple Wound Measurement - one wound 1 5 [] - Complex Wound Measurement - multiple wounds 0 INTERVENTIONS - Wound Dressings X - Small Wound Dressing one or multiple wounds 1 10 [] - Medium Wound Dressing one or multiple wounds 0 [] - Large Wound Dressing one or multiple wounds 0 X - Application of Medications - topical 1 5 [] - Application of Medications - injection 0 INTERVENTIONS - Miscellaneous [] - External ear exam 0 [] - Specimen Collection (cultures, biopsies, blood, body fluids, etc.) 0 [] - Specimen(s) / Culture(s) sent or taken to Lab for analysis 0 X - Patient Transfer (multiple staff / Civil Service fast streamer / Similar devices) 1 10 [] - Simple Staple / Suture removal (25 or less) 0 [] - Complex Staple / Suture removal (26 or more) 0 [] - Hypo / Hyperglycemic Management (close monitor of Blood Glucose) 0 [] - Ankle / Brachial Index (ABI) - do not check if billed separately 0 X - Vital Signs 1 5 Has the patient been seen at the hospital within the last three years: Yes Total Score: 100 Level Of Care: New/Established - Level 3 Electronic Signature(s) Signed: 03/12/2020 6:07:56 PM By: Baruch Gouty RN, BSN Entered By: Baruch Gouty on 03/12/2020 14:52:04 -------------------------------------------------------------------------------- Encounter Discharge Information Details Patient Name: Date of Service: [name], [name] 03/12/2020 2:15 PM Medical Record [id number] Patient Account Number: [id number] Date of Birth/Sex: Treating RN: 1961/05/02 (59 y.o. Debby Bud Primary Care Provider: Delia Chimes Other Clinician: Referring Provider: Treating Provider/Extender:Stone III, Philis Nettle, ZOE Weeks in Treatment: 160 Encounter  Discharge Information Items Discharge Condition: Stable Ambulatory Status: Wheelchair Discharge Destination: Home Transportation: Private Auto Accompanied By: self Schedule Follow-up Appointment: Yes Clinical Summary of Care: Electronic Signature(s) Signed: 03/12/2020 5:46:37 PM By: Deon Pilling Entered By: Deon Pilling on 03/12/2020 15:50:44 -------------------------------------------------------------------------------- Lower Extremity Assessment Details Patient Name: Date of Service: JONNELL, HENTGES 03/12/2020 2:15 PM Medical Record [id number] Patient Account Number: [id number] Date of Birth/Sex: Treating RN: 09-Mar-1961 (59 y.o. Elam Dutch Primary Care Provider: Delia Chimes Other Clinician: Referring Provider: Treating Provider/Extender:Stone III, Philis Nettle, ZOE Weeks in Treatment: 160 Electronic Signature(s) Signed: 03/12/2020 6:07:56 PM By: Baruch Gouty RN, BSN Signed: 03/19/2020 9:20:10 AM By: Sandre Kitty Entered By: Sandre Kitty on 03/12/2020 14:06:05 -------------------------------------------------------------------------------- Multi Wound Chart Details Patient Name: Date of Service: MUSKAN, BOLLA 03/12/2020 2:15 PM Medical Record [id number] Patient Account Number: [id number] Date of Birth/Sex: Treating RN: [birth year] (59 y.o. Elam Dutch Primary Care Provider: Delia Chimes Other Clinician: Referring Provider: Treating Provider/Extender:Stone III, Philis Nettle, ZOE Weeks in Treatment: 160 Vital Signs Height(in): 67 Pulse(bpm): 73 Weight(lbs): 220 Blood Pressure(mmHg): 127/64 Body Mass Index(BMI): 34 Temperature(F): 97.6 Respiratory 16 Rate(breaths/min): Photos: [1:No Photos] [N/A:N/A] Wound Location: [1:Right Ischium] [N/A:N/A] Wounding Event: [1:Pressure Injury] [N/A:N/A] Primary Etiology: [1:Pressure Ulcer] [N/A:N/A] Comorbid History: [1:Hypertension, Type II Diabetes, Osteoarthritis, Neuropathy] [N/A:N/A] Date  Acquired: [1:12/06/2005] [N/A:N/A] Weeks of Treatment: [1:160] [N/A:N/A] Wound Status: [1:Open] [N/A:N/A] Clustered Wound: [1:Yes] [N/A:N/A] Clustered Quantity: [1:4] [N/A:N/A] Measurements L x W x D 5x8.6x0.5 [N/A:N/A] (cm) Area (cm) : [1:33.772] [N/A:N/A] Volume (cm) : [1:16.886] [N/A:N/A] % Reduction in Area: [1:-79.20%] [N/A:N/A] % Reduction in Volume: -198.60% [N/A:N/A] Classification: [1:Category/Stage III] [N/A:N/A] Exudate Amount: [1:Medium] [N/A:N/A] Exudate Type: [1:Serosanguineous] [N/A:N/A] Exudate Color: [1:red, brown] [N/A:N/A] Wound Margin: [1:Flat and Intact] [N/A:N/A] Granulation Amount: [1:Large (67-100%)] [N/A:N/A] Granulation Quality: [1:Pink] [N/A:N/A] Necrotic Amount: [1:Small (1-33%)] [N/A:N/A] Exposed Structures: [1:Fat Layer (Subcutaneous Tissue) Exposed: Yes Fascia:  No Tendon: No Muscle: No Joint: No Bone: No Small (1-33%)] [N/A:N/A N/A] Treatment Notes Wound #1 (Right Ischium) 1. Cleanse With Wound Cleanser 2. Periwound Care Skin Prep 3. Primary Dressing Applied Calcium Alginate Ag 4. Secondary Dressing ABD Pad 5. Secured With [facility]) Signed: 03/14/2020 6:10:03 PM By: Baruch Gouty RN, BSN Entered By: Baruch Gouty on 03/13/2020 10:58:37 -------------------------------------------------------------------------------- Multi-Disciplinary Care Plan Details Patient Name: Date of Service: [name], [name] 03/12/2020 2:15 PM Medical Record [id number] Patient Account Number: [id number] Date of Birth/Sex: Treating RN: July 31, 1961 (59 y.o. Elam Dutch Primary Care Provider: Delia Chimes Other Clinician: Referring Provider: Treating Provider/Extender:Stone III, Philis Nettle, ZOE Weeks in Treatment: 160 Active Inactive Pressure Nursing Diagnoses: Knowledge deficit related to causes and risk factors for pressure ulcer development Goals: Patient will remain free from development of additional pressure  ulcers Target Resolution Date Initiated: 10/27/2018 Date Inactivated: 12/05/2019 Date: 11/23/2019 Goal Status: Met Patient/caregiver will verbalize risk factors for pressure ulcer development Date Inactivated: 10/27/2018 Target12/25/2019 Resolution Date Initiated: 02/11/2017 Date: Goal Status: Met Patient/caregiver will verbalize understanding of pressure ulcer management Date Initiated: 12/05/2019 Target Resolution Date: 04/09/2020 Goal Status: Active Interventions: Assess: immobility, friction, shearing, incontinence upon admission and as needed Notes: Wound/Skin Impairment Nursing Diagnoses: Impaired tissue integrity Knowledge deficit related to ulceration/compromised skin integrity Goals: Patient/caregiver will verbalize understanding of skin care regimen Date Initiated: 11/11/2017 Target Resolution Date: 04/09/2020 Goal Status: Active Interventions: Assess patient/caregiver ability to obtain necessary supplies Assess patient/caregiver ability to perform ulcer/skin care regimen upon admission and as needed Assess ulceration(s) every visit Treatment Activities: Patient referred to home care : 11/11/2017 Skin care regimen initiated : 11/11/2017 Topical wound management initiated : 11/11/2017 Notes: Electronic Signature(s) Signed: 03/12/2020 6:07:56 PM By: Baruch Gouty RN, BSN Entered By: Baruch Gouty on 03/12/2020 14:44:12 -------------------------------------------------------------------------------- Pain Assessment Details Patient Name: Date of Service: [name], [name] 03/12/2020 2:15 PM Medical Record [id number] Patient Account Number: [id number] Date of Birth/Sex: Treating RN: July 06, 1961 (59 y.o. Elam Dutch Primary Care Provider: Delia Chimes Other Clinician: Referring Provider: Treating Provider/Extender:Stone III, Philis Nettle, ZOE Weeks in Treatment: 160 Active Problems Location of Pain Severity and Description of Pain Patient Has Paino Yes Site  Locations Rate the pain. Current Pain Level: 3 Pain Management and Medication Current Pain Management: Electronic Signature(s) Signed: 03/12/2020 6:07:56 PM By: Baruch Gouty RN, BSN Signed: 03/19/2020 9:20:10 AM By: Sandre Kitty Entered By: Sandre Kitty on 03/12/2020 14:05:58 -------------------------------------------------------------------------------- Patient/Caregiver Education Details Patient Name: Date of Service: SHENISE, WOLGAMOTT 4/7/2021andnbsp2:15 PM Medical Record [id number] Patient Account Number: [id number] Date of Birth/Gender: Treating RN: [birth year] (59 y.o. Elam Dutch Primary Care Physician: Delia Chimes Other Clinician: Referring Physician: Treating Physician/Extender:Stone III, Philis Nettle, ZOE Weeks in Treatment: 160 Education Assessment Education Provided To: Patient Education Topics Provided Pressure: Methods: Explain/Verbal Responses: Reinforcements needed, State content correctly Wound/Skin Impairment: Methods: Explain/Verbal Responses: Reinforcements needed, State content correctly Electronic Signature(s) Signed: 03/12/2020 6:07:56 PM By: Baruch Gouty RN, BSN Entered By: Baruch Gouty on 03/12/2020 14:44:39 -------------------------------------------------------------------------------- Wound Assessment Details Patient Name: Date of Service: [name], [name] 03/12/2020 2:15 PM Medical Record [id number] Patient Account Number: [id number] Date of Birth/Sex: Treating RN: 1961-09-20 (59 y.o. Elam Dutch Primary Care Provider: Delia Chimes Other Clinician: Referring Provider: Treating Provider/Extender:Stone III, Philis Nettle, ZOE Weeks in Treatment: 160 Wound Status Wound Number: 1 Primary Pressure Ulcer Etiology: Wound Location: Right Ischium Wound Open Wounding Event: Pressure Injury Status: Status: Date Acquired: 12/06/2005 Comorbid Hypertension, Type II Diabetes, Weeks Of Treatment: 160 History:  Osteoarthritis, Neuropathy Clustered Wound:  Yes Photos Photo Uploaded By: Mikeal Hawthorne on 03/17/2020 15:18:19 Wound Measurements Length: (cm) 5 % Reductio Width: (cm) 8.6 % Reductio Depth: (cm) 0.5 Epithelial Clustered Quantity: 4 Tunneling: Area: (cm) 33.772 Undermini Volume: (cm) 16.886 Wound Description Classification: Category/Stage III Wound Margin: Flat and Intact Exudate Amount: Medium Exudate Type: Serosanguineous Exudate Color: red, brown Wound Bed Granulation Amount: Large (67-100%) Granulation Quality: Pink Necrotic Amount: Small (1-33%) Necrotic Quality: Adherent Slough Foul Odor After Cleansing: No Slough/Fibrino Yes Exposed Structure Fascia Exposed: No Fat Layer (Subcutaneous Tissue) Exposed: Yes Tendon Exposed: No Muscle Exposed: No Joint Exposed: No Bone Exposed: No n in Area: -79.2% n in Volume: -198.6% ization: Small (1-33%) No ng: No Treatment Notes Wound #1 (Right Ischium) 1. Cleanse With Wound Cleanser 2. Periwound Care Skin Prep 3. Primary Dressing Applied Calcium Alginate Ag 4. Secondary Dressing ABD Pad 5. Secured With [facility]) Signed: 03/12/2020 5:52:27 PM By: Carlene Coria RN Signed: 03/12/2020 6:07:56 PM By: Baruch Gouty RN, BSN Entered By: Carlene Coria on 03/12/2020 14:12:47 -------------------------------------------------------------------------------- Vitals Details Patient Name: Date of Service: [name], [name] 03/12/2020 2:15 PM Medical Record [id number] Patient Account Number: [id number] Date of Birth/Sex: Treating RN: 11-19-1961 (59 y.o. Elam Dutch Primary Care Provider: Delia Chimes Other Clinician: Referring Provider: Treating Provider/Extender:Stone III, Philis Nettle, ZOE Weeks in Treatment: 160 Vital Signs Time Taken: 14:05 Temperature (F): 97.6 Height (in): 67 Pulse (bpm): 73 Weight (lbs): 220 Respiratory Rate (breaths/min): 16 Body Mass Index (BMI): 34.5 Blood  Pressure (mmHg): 127/64 Reference Range: 80 - 120 mg / dl Electronic Signature(s) Signed: 03/19/2020 9:20:10 AM By: Sandre Kitty Entered By: Sandre Kitty on 03/12/2020 14:05:47

## 2020-03-28 ENCOUNTER — Encounter: Payer: Self-pay | Admitting: Gynecologic Oncology

## 2020-03-28 ENCOUNTER — Inpatient Hospital Stay: Payer: Medicare Other | Attending: Gynecologic Oncology | Admitting: Gynecologic Oncology

## 2020-03-28 ENCOUNTER — Other Ambulatory Visit: Payer: Self-pay

## 2020-03-28 VITALS — BP 145/64 | HR 83 | Temp 98.9°F | Resp 17 | Ht 67.0 in | Wt 294.0 lb

## 2020-03-28 DIAGNOSIS — Z90722 Acquired absence of ovaries, bilateral: Secondary | ICD-10-CM | POA: Diagnosis not present

## 2020-03-28 DIAGNOSIS — Z90710 Acquired absence of both cervix and uterus: Secondary | ICD-10-CM | POA: Diagnosis not present

## 2020-03-28 DIAGNOSIS — C541 Malignant neoplasm of endometrium: Secondary | ICD-10-CM | POA: Insufficient documentation

## 2020-03-28 DIAGNOSIS — Z923 Personal history of irradiation: Secondary | ICD-10-CM | POA: Diagnosis not present

## 2020-03-28 NOTE — Patient Instructions (Signed)
Please notify Dr Denman George at phone number [phone] if you notice vaginal bleeding, new pelvic or abdominal pains, bloating, feeling full easy, or a change in bladder or bowel function.   Please have Dr Clabe Seal office contact Dr Serita Grit office (at [phone]) in July after your visit with him to request an appointment with her for October, 2021.

## 2020-03-28 NOTE — Progress Notes (Signed)
Follow-up Note: Gyn-Onc  Consult was initially requested by Dr. Mora Bellman for the evaluation of [name] 59 y.o. female  CC:  Chief Complaint  Patient presents with  . Endometrial cancer ([place])    Follow up    Assessment/Plan:  [name]  is a 59 y.o.  year old with MS (wheelchair bound) with a history of stage IB grade 2 endometrial cancer with positive ITC's in the SLN's and LVSI.  High/intermediate risk factors, s/p WPRT and vaginal brachytherapy completed May, 2020.   I recommend she follow-up at 3 monthly intervals for symptom review, physical examination and pelvic examination. Pap smear is not recommended in routine endometrial cancer surveillance. After 2 years we will space these visits to every 6 months, and then annually if recurrence has not developed within 5 years. All questions were answered.  HPI: [name] is a 59 year old P1 who is seen in consultation at the request of Dr Elly Modena for grade 2 endometrial cancer.  The patient reports a history of vaginal bleeding for 6 months (since the summer of 2019).  Formed a transvaginal ultrasound scan on November 15, 2018 which revealed a uterus measuring 11.5 x 7.2 x 7.3 cm with a posterior lower uterine segment myoma measuring approximately 6 cm.  The endometrium was thickened to 34 mm.  The left and right ovaries were not clearly visualized.  An endometrial biopsy was then taken in the office which revealed FIGO grade 2 endometrioid adenocarcinoma.  The patient has a medical history significant for multiple sclerosis.  She is wheelchair-bound.  She has a suprapubic catheter.  She has spasms and no function in her lower extremities.  She has multiple decubitus ulcers.  She lives with her daughter.  She is independent through the day in her motorized wheelchair.  She requires Hoyer lift for transfers.  Her associated medical history significant for hypertension, diabetes mellitus on oral medications, and  arthritis  Her family history is significant for a mother who had a history of breast cancer.  A suprapubic catheter was placed in 2009 and then later replaced.  She has had no other prior abdominal surgeries.  Her neurologist is Dr. Felecia Shelling. Endometrial biopsy was performed on 11/10/18 which showed FIGO grade 2 endometrial cancer. [place] on November 15, 2018 showed a uterus measuring 11.5 x 7.2 x 7.3 cm with a 5 cm posterior leiomyoma in the mid to lower uterine segment.  There was a thickened endometrium at 34 mm.  The ovaries were not visualized.  There is no free fluid.  On 12/19/18 she underwent robotic assisted total hysterectomy BSO sentinel lymph node biopsy.  The uterus is greater than 250 g.  The procedure was complicated by morbid obesity and multiple sclerosis, paralysis.  Final pathology revealed a FIGO grade 2 endometrial cancer with outer half myometrial invasion (30 mm of 40 mm depth invasion close (.  LVSI was identified.  There was carcinoma involving the lower uterine segment.  The tumor size was 9.5 cm.  Ovaries and fallopian tubes and cervix were unremarkable.  There were no macro micrometastases and sentinel lymph nodes however isolated tumor cells were identified in the left obturator sentinel lymph node.  She was staged as FIGO stage Ib grade 2 adenocarcinoma of the endometrium with high risk features and recommendation for adjuvant radiation was made in accordance with NCCN guidelines.  She received adjuvant radiation for her high risk early stage endometrial cancer. Radiation treatment dates:   1. IMRT: 02/14/19-03/20/19  2. HDR Ir-192: 03/27/19, 04/04/19, 04/11/19   Site/dose: 1. Pelvis; 25 fractions of 1.8 Gy for a total of 45 Gy                    2. Vagina, 6 Gy in 3 fractions for a total dose of 18 Gy  Interval Hx:  Today she presents for scheduled follow up. She reports no symptoms concerning for recurrence.   Current Meds:    Current Outpatient Medications:  .  Adhesive Tape (PAPER TAPE 1"X10YD) TAPE, Apply to dressings as needed, Disp: 4 each, Rfl: 6 .  atorvastatin (LIPITOR) 10 MG tablet, Take 10 mg by mouth daily. , Disp: , Rfl:  .  baclofen (LIORESAL) 20 MG tablet, TAKE 1 TABLET BY MOUTH FOUR TIMES A DAY**OFFICE VISIT NEEDED**, Disp: 120 tablet, Rfl: 0 .  Catheters (DOVER UNIVERSAL FOLEY TRAY) KIT, Use foley tray kit to change catheter every 3 weeks or as needed, Disp: 15 each, Rfl: 1 .  Catheters (FOLEY CATHETER 2-WAY) MISC, Use to aspirate urine suprapubic, Disp: 15 each, Rfl: 1 .  Cholecalciferol (D3-1000) 1000 units capsule, Take 2,000 Units by mouth daily. , Disp: , Rfl:  .  Control Gel Formula Dressing (DUODERM CGF DRESSING) MISC, Apply 1 each topically daily as needed., Disp: 5 each, Rfl: 11 .  cyanocobalamin 500 MCG tablet, Take 500 mcg by mouth daily., Disp: , Rfl:  .  Disposable Gloves MISC, Use disposable gloves to change catheter as needed, Disp: 200 each, Rfl: 11 .  furosemide (LASIX) 20 MG tablet, Take 1 tablet (20 mg total) by mouth daily., Disp: 90 tablet, Rfl: 0 .  glipiZIDE (GLUCOTROL) 5 MG tablet, Take 1 tablet (5 mg total) by mouth daily before lunch., Disp: 90 tablet, Rfl: 3 .  hydrochlorothiazide (HYDRODIURIL) 25 MG tablet, TAKE 1 TABLET BY MOUTH EVERY MORNING, Disp: 90 tablet, Rfl: 0 .  Incontinence Supply Disposable (TENA FLEX 16 PLUS) MISC, Use daily as needed, Disp: 30 each, Rfl: 11 .  Incontinence Supply Disposable (TENA PROTECT UNDERWEAR PLS/XLG) MISC, Use for incontinence., Disp: 30 each, Rfl: 11 .  Interferon Beta-1b (BETASERON) 0.3 MG KIT injection, Inject 0.3 mg into the skin every other day., Disp: 36 each, Rfl: 3 .  lidocaine (XYLOCAINE) 2 % jelly, USE when need with dressings, Disp: , Rfl: 1 .  metFORMIN (GLUCOPHAGE) 1000 MG tablet, Take 1,000 mg by mouth 2 (two) times daily with a meal., Disp: , Rfl:  .  Methenamine-Sodium Salicylate (AZO URINARY TRACT DEFENSE) 162-162.5 MG  TABS, Take as instructed, Disp: , Rfl:  .  MOVANTIK 25 MG TABS tablet, Take 25 mg by mouth every morning., Disp: , Rfl: 4 .  NON FORMULARY, Power wheelchair repairs  Diagnosis code z99.3, Disp: 1 each, Rfl: 0 .  NON FORMULARY, Dispense catheter urine drainage bags, Disp: 90 Bag, Rfl: 3 .  nystatin ointment (MYCOSTATIN), Apply to affected area twice daily as needed for irritation, Disp: 30 g, Rfl: 6 .  Ostomy Supplies (NEW IMAGE SKIN/FLANGE/TAPE) MISC, 1 application by Does not apply route daily as needed., Disp: 30 each, Rfl: 11 .  oxyCODONE (OXYCONTIN) 20 mg 12 hr tablet, Take 1 tablet (20 mg total) by mouth 3 (three) times daily as needed (pain)., Disp: 90 tablet, Rfl: 0 .  oxyCODONE-acetaminophen (PERCOCET) 10-325 MG tablet, Take 1 tablet by mouth every 8 (eight) hours as needed for pain. Needs office visit., Disp: 90 tablet, Rfl: 0 .  potassium chloride (K-DUR,KLOR-CON) 10 MEQ tablet, TAKE 3 TABLETS  BY MOUTH TWICE DAILY**OFFICE VISIT NEEDED FOR REFILLS**, Disp: 180 tablet, Rfl: 0 .  Syringe, Disposable, (B-D SYRINGE LUER-LOK 30CC) 30 ML MISC, Use to change catheter, Disp: 15 each, Rfl: 0 .  Wound Dressings (GRX HYDROGEL GAUZE 4X4) PADS, Use gauze to clean wound daily as needed, Disp: 200 each, Rfl: 11   Allergy: No Known Allergies  Social Hx:   Social History   Socioeconomic History  . Marital status: Single    Spouse name: Not on file  . Number of children: Not on file  . Years of education: Not on file  . Highest education level: Not on file  Occupational History  . Not on file  Tobacco Use  . Smoking status: Never Smoker  . Smokeless tobacco: Never Used  Substance and Sexual Activity  . Alcohol use: No  . Drug use: No  . Sexual activity: Not on file  Other Topics Concern  . Not on file  Social History Narrative  . Not on file   Social Determinants of Health   Financial Resource Strain:   . Difficulty of Paying Living Expenses:   Food Insecurity:   . Worried About  Charity fundraiser in the Last Year:   . Arboriculturist in the Last Year:   Transportation Needs:   . Film/video editor (Medical):   Marland Kitchen Lack of Transportation (Non-Medical):   Physical Activity:   . Days of Exercise per Week:   . Minutes of Exercise per Session:   Stress:   . Feeling of Stress :   Social Connections:   . Frequency of Communication with Friends and Family:   . Frequency of Social Gatherings with Friends and Family:   . Attends Religious Services:   . Active Member of Clubs or Organizations:   . Attends Archivist Meetings:   Marland Kitchen Marital Status:   Intimate Partner Violence:   . Fear of Current or Ex-Partner:   . Emotionally Abused:   Marland Kitchen Physically Abused:   . Sexually Abused:     Past Surgical Hx:  Past Surgical History:  Procedure Laterality Date  . COLONOSCOPY    . CYSTOSCOPY N/A 01/22/2020   Procedure: CYSTOSCOPY WITH SUPRA PUBIC TUBE CHANGE, BOTOX 200 UNITS, RETROGRADE PYELOGRAM BILATERAL. FULGERATION .5 CM-2CM;  Surgeon: Festus Aloe, MD;  Location: [facility];  Service: Urology;  Laterality: N/A;  . ESOPHAGOGASTRODUODENOSCOPY N/A 12/02/2017   Procedure: ESOPHAGOGASTRODUODENOSCOPY (EGD);  Surgeon: Ronnette Juniper, MD;  Location: [facility];  Service: Gastroenterology;  Laterality: N/A;  . ROBOTIC ASSISTED TOTAL HYSTERECTOMY WITH BILATERAL SALPINGO OOPHERECTOMY Bilateral 12/19/2018   Procedure: XI ROBOTIC ASSISTED TOTAL HYSTERECTOMY (UTERUS GREATER THAN 250gr) WITH BILATERAL SALPINGO OOPHORECTOMY;  Surgeon: Everitt Amber, MD;  Location: [facility];  Service: Gynecology;  Laterality: Bilateral;  . SENTINEL NODE BIOPSY N/A 12/19/2018   Procedure: SENTINEL NODE BIOPSY;  Surgeon: Everitt Amber, MD;  Location: [facility];  Service: Gynecology;  Laterality: N/A;  . SUPRAPUBIC CATHETER INSERTION  2009  . TONSILLECTOMY    . TUBAL LIGATION      Past Medical Hx:  Past Medical History:  Diagnosis Date  . Anemia    history of  . Arthritis   . Decubitus ulcers     currently treated at wound center  . Diabetes mellitus without complication ([place])   . Diabetic polyneuropathy ([place])   . Endometrial cancer ([facility])    Grade 2  . Fibroids   . GERD (gastroesophageal reflux disease)   . Heartburn  Occ  . Hypertension   . Mixed hyperlipidemia   . Morbid obesity ([place])   . MS (multiple sclerosis) ([place])     Past Gynecological History:  See HPI No LMP recorded. Patient has had a hysterectomy.  Family Hx:  Family History  Problem Relation Age of Onset  . Cancer Mother   . Hypertension Mother   . Heart disease Father   . Diabetes Father   . Hypertension Sister   . Hypertension Brother   . Breast cancer Neg Hx     Review of Systems:  Constitutional  Feels well,   ENT Normal appearing ears and nares bilaterally Skin/Breast  No rash, sores, jaundice, itching, dryness Cardiovascular  No chest pain, shortness of breath, or edema  Pulmonary  No cough or wheeze.  Gastro Intestinal  No nausea, vomitting, or diarrhoea. No bright red blood per rectum, no abdominal pain,+constipation.  Genito Urinary  No frequency, urgency, dysuria, no bleeding Musculo Skeletal  No myalgia, arthralgia, joint swelling or pain  Neurologic  + spastic paralysis lower extremities Psychology  No depression, anxiety, insomnia.   Vitals:  Blood pressure (!) 145/64, pulse 83, temperature 98.9 F (37.2 C), temperature source Temporal, resp. rate 17, height _0  (1.702 m), SpO2 98 %.  Physical Exam: WD in NAD Neck  Supple NROM, without any enlargements.  Lymph Node Survey No cervical supraclavicular or inguinal adenopathy Cardiovascular  Pulse normal rate, regularity and rhythm. S1 and S2 normal.  Lungs  Clear to auscultation bilateraly, without wheezes/crackles/rhonchi. Good air movement.  Skin  No rash/lesions/breakdown  Psychiatry  Alert and oriented to person, place, and time  Abdomen  Normoactive bowel sounds, abdomen soft, non-tender and obese without  evidence of hernia. Suprapubic catheter sites present. Well healed incisions. Back No CVA tenderness Genito Urinary  Vaginal smooth, no bleeding, no masses palpable. Rectal  deferred Extremities  No bilateral cyanosis, clubbing or edema. Right posterior thigh was healing well.    Thereasa Solo, MD  03/28/2020, 2:33 PM

## 2020-04-04 DIAGNOSIS — N318 Other neuromuscular dysfunction of bladder: Secondary | ICD-10-CM | POA: Diagnosis not present

## 2020-04-07 ENCOUNTER — Telehealth: Payer: Self-pay | Admitting: Family Medicine

## 2020-04-07 NOTE — Telephone Encounter (Signed)
Spoke with Caren Griffins, she will refax the form with attn to myself for doctor completion

## 2020-04-07 NOTE — Telephone Encounter (Signed)
Caren Griffins with  [employer] is   Following up for new request for repair on patents wheel chair . Paperwork was faxed on the 27th .please call Caren Griffins at [phone]

## 2020-04-11 ENCOUNTER — Encounter (HOSPITAL_BASED_OUTPATIENT_CLINIC_OR_DEPARTMENT_OTHER): Payer: Medicare Other | Attending: Internal Medicine | Admitting: Internal Medicine

## 2020-04-11 ENCOUNTER — Telehealth: Payer: Self-pay

## 2020-04-11 DIAGNOSIS — M1990 Unspecified osteoarthritis, unspecified site: Secondary | ICD-10-CM | POA: Diagnosis not present

## 2020-04-11 DIAGNOSIS — I10 Essential (primary) hypertension: Secondary | ICD-10-CM | POA: Diagnosis not present

## 2020-04-11 DIAGNOSIS — L89313 Pressure ulcer of right buttock, stage 3: Secondary | ICD-10-CM | POA: Insufficient documentation

## 2020-04-11 DIAGNOSIS — E1140 Type 2 diabetes mellitus with diabetic neuropathy, unspecified: Secondary | ICD-10-CM | POA: Insufficient documentation

## 2020-04-11 DIAGNOSIS — Z993 Dependence on wheelchair: Secondary | ICD-10-CM | POA: Diagnosis not present

## 2020-04-11 DIAGNOSIS — G35 Multiple sclerosis: Secondary | ICD-10-CM | POA: Insufficient documentation

## 2020-04-11 DIAGNOSIS — E11622 Type 2 diabetes mellitus with other skin ulcer: Secondary | ICD-10-CM | POA: Diagnosis not present

## 2020-04-11 DIAGNOSIS — E1151 Type 2 diabetes mellitus with diabetic peripheral angiopathy without gangrene: Secondary | ICD-10-CM | POA: Diagnosis not present

## 2020-04-11 NOTE — Telephone Encounter (Signed)
Returned call, lm for call bk

## 2020-04-11 NOTE — Telephone Encounter (Signed)
Form is being refaxed for repair of wheelchari
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
## 2020-04-11 NOTE — Telephone Encounter (Signed)
[name] returning call, requesting call back

## 2020-04-14 ENCOUNTER — Other Ambulatory Visit: Payer: Self-pay | Admitting: Family Medicine

## 2020-04-14 DIAGNOSIS — Z79891 Long term (current) use of opiate analgesic: Secondary | ICD-10-CM

## 2020-04-14 DIAGNOSIS — G35 Multiple sclerosis: Secondary | ICD-10-CM

## 2020-04-14 NOTE — Telephone Encounter (Signed)
Patient is requesting a refill of the following medications: Requested Prescriptions   Pending Prescriptions Disp Refills   oxyCODONE-acetaminophen (PERCOCET) 10-325 MG tablet 90 tablet 0    Sig: Take 1 tablet by mouth every 8 (eight) hours as needed for pain.    Date of patient request: 04/14/20 Last office visit: 02/26/20 Date of last refill: 03/14/20 Last refill amount: 90 x1Rf Follow up time period per chart: 07/09/20   DR Bridget Hartshorn I see this patient is still scheduled to see you in Aug was sure if this is with ur new mobile clinic or they forgot to cancel  this appt. Just want to make you aware

## 2020-04-14 NOTE — Telephone Encounter (Signed)
Copied from [place] [phone]. Topic: Quick Communication - Rx Refill/Question >> Apr 14, 2020  2:31 PM Rainey Pines A wrote: Medication: oxyCODONE-acetaminophen (PERCOCET) 10-325 MG tablet   Has the patient contacted their pharmacy? yes (Agent: If no, request that the patient contact the pharmacy for the refill.) (Agent: If yes, when and what did the pharmacy advise?)Contact PCP  Preferred Pharmacy (with phone number or street name): [place], [place]  Phone:  [phone] Fax:  [phone]     Agent: Please be advised that RX refills may take up to 3 business days. We ask that you follow-up with your pharmacy.

## 2020-04-14 NOTE — Telephone Encounter (Signed)
Requested medication (s) are due for refill today: yes  Requested medication (s) are on the active medication list: yes  Last refill: 03/14/20  Future visit scheduled: yes  Notes to clinic:  not delegated    Requested Prescriptions  Pending Prescriptions Disp Refills   oxyCODONE-acetaminophen (PERCOCET) 10-325 MG tablet 90 tablet 0    Sig: Take 1 tablet by mouth every 8 (eight) hours as needed for pain.      Not Delegated - Analgesics:  Opioid Agonist Combinations Failed - 04/14/2020  2:34 PM      Failed - This refill cannot be delegated      Failed - Urine Drug Screen completed in last 360 days.      Passed - Valid encounter within last 6 months    Recent Outpatient Visits           1 month ago Medicare annual wellness visit, subsequent   Primary Care at [facility], [name], MD   1 month ago Screening for colon cancer   Primary Care at [place], MD   3 months ago Type 2 diabetes mellitus with diabetic polyneuropathy, without long-term current use of insulin ([place])   Primary Care at [facility], [name], MD   4 months ago Chronically on opiate therapy   Primary Care at [facility], [name], MD   9 months ago Multiple sclerosis [facility])   Primary Care at [place], MD       Future Appointments             In 2 months Forrest Moron, MD Primary Care at [place], [facility]

## 2020-04-15 MED ORDER — OXYCODONE-ACETAMINOPHEN 10-325 MG PO TABS: 1.0000 | ORAL_TABLET | Freq: Three times a day (TID) | ORAL | 0 refills | Status: DC | PRN

## 2020-04-15 NOTE — Progress Notes (Signed)
[name], [name] ([id number]) Visit Report for 04/11/2020 Arrival Information Details Patient Name: Date of Service: [name] [facility] 04/11/2020 2:15 PM Medical Record Number: [id number] Patient Account Number: [id number] Date of Birth/Sex: Treating RN: 01-Apr-1961 (59 y.o. Elam Dutch Primary Care [place]: Olivia Canter, ZO E Other Clinician: Referring Katria Botts: Treating Kaydyn Chism/Extender: Gale Journey, ZO E Weeks in Treatment: 165 Visit Information History Since Last Visit Added or deleted any medications: No Patient Arrived: Wheel Chair Any new allergies or adverse reactions: No Arrival Time: 14:48 Had a fall or experienced change in No Accompanied By: self activities of daily living that may affect Transfer Assistance: None risk of falls: Patient Identification Verified: Yes Signs or symptoms of abuse/neglect since last visito No Secondary Verification Process Completed: Yes Hospitalized since last visit: No Patient Requires Transmission-Based Precautions: No Implantable device outside of the clinic excluding No Patient Has Alerts: No cellular tissue based products placed in the center since last visit: Has Dressing in Place as Prescribed: Yes Pain Present Now: Yes Electronic Signature(s) Signed: 04/15/2020 8:52:48 AM By: Sandre Kitty Entered By: Sandre Kitty on 04/11/2020 14:48:44 -------------------------------------------------------------------------------- Clinic Level of Care Assessment Details Patient Name: Date of Service: [name] [facility] 04/11/2020 2:15 PM Medical Record Number: [id number] Patient Account Number: [id number] Date of Birth/Sex: Treating RN: March 09, 1961 (59 y.o. Elam Dutch Primary Care Arita Severtson: Olivia Canter, ZO E Other Clinician: Referring Kenecia Barren: Treating Laylanie Kruczek/Extender: Gale Journey, ZO E Weeks in Treatment: 165 Clinic Level of Care Assessment Items TOOL 4 Quantity Score '[]'$  - 0 Use when only an EandM is  performed on FOLLOW-UP visit ASSESSMENTS - Nursing Assessment / Reassessment X- 1 10 Reassessment of Co-morbidities (includes updates in patient status) X- 1 5 Reassessment of Adherence to Treatment Plan ASSESSMENTS - Wound and Skin A ssessment / Reassessment X - Simple Wound Assessment / Reassessment - one wound 1 5 '[]'$  - 0 Complex Wound Assessment / Reassessment - multiple wounds '[]'$  - 0 Dermatologic / Skin Assessment (not related to wound area) ASSESSMENTS - Focused Assessment '[]'$  - 0 Circumferential Edema Measurements - multi extremities '[]'$  - 0 Nutritional Assessment / Counseling / Intervention '[]'$  - 0 Lower Extremity Assessment (monofilament, tuning fork, pulses) '[]'$  - 0 Peripheral Arterial Disease Assessment (using hand held doppler) ASSESSMENTS - Ostomy and/or Continence Assessment and Care '[]'$  - 0 Incontinence Assessment and Management '[]'$  - 0 Ostomy Care Assessment and Management (repouching, etc.) PROCESS - Coordination of Care X - Simple Patient / Family Education for ongoing care 1 15 '[]'$  - 0 Complex (extensive) Patient / Family Education for ongoing care X- 1 10 Staff obtains Programmer, systems, Records, T Results / Process Orders est '[]'$  - 0 Staff telephones HHA, Nursing Homes / Clarify orders / etc '[]'$  - 0 Routine Transfer to another Facility (non-emergent condition) '[]'$  - 0 Routine Hospital Admission (non-emergent condition) '[]'$  - 0 New Admissions / Biomedical engineer / Ordering NPWT Apligraf, etc. , '[]'$  - 0 Emergency Hospital Admission (emergent condition) X- 1 10 Simple Discharge Coordination '[]'$  - 0 Complex (extensive) Discharge Coordination PROCESS - Special Needs '[]'$  - 0 Pediatric / Minor Patient Management '[]'$  - 0 Isolation Patient Management '[]'$  - 0 Hearing / Language / Visual special needs '[]'$  - 0 Assessment of Community assistance (transportation, D/C planning, etc.) '[]'$  - 0 Additional assistance / Altered mentation '[]'$  - 0 Support Surface(s) Assessment  (bed, cushion, seat, etc.) INTERVENTIONS - Wound Cleansing / Measurement X - Simple Wound Cleansing - one wound 1 5 '[]'$  -  0 Complex Wound Cleansing - multiple wounds X- 1 5 Wound Imaging (photographs - any number of wounds) '[]'$  - 0 Wound Tracing (instead of photographs) X- 1 5 Simple Wound Measurement - one wound '[]'$  - 0 Complex Wound Measurement - multiple wounds INTERVENTIONS - Wound Dressings X - Small Wound Dressing one or multiple wounds 1 10 '[]'$  - 0 Medium Wound Dressing one or multiple wounds '[]'$  - 0 Large Wound Dressing one or multiple wounds X- 1 5 Application of Medications - topical '[]'$  - 0 Application of Medications - injection INTERVENTIONS - Miscellaneous '[]'$  - 0 External ear exam '[]'$  - 0 Specimen Collection (cultures, biopsies, blood, body fluids, etc.) '[]'$  - 0 Specimen(s) / Culture(s) sent or taken to Lab for analysis X- 1 10 Patient Transfer (multiple staff / Civil Service fast streamer / Similar devices) '[]'$  - 0 Simple Staple / Suture removal (25 or less) '[]'$  - 0 Complex Staple / Suture removal (26 or more) '[]'$  - 0 Hypo / Hyperglycemic Management (close monitor of Blood Glucose) '[]'$  - 0 Ankle / Brachial Index (ABI) - do not check if billed separately X- 1 5 Vital Signs Has the patient been seen at the hospital within the last three years: Yes Total Score: 100 Level Of Care: New/Established - Level 3 Electronic Signature(s) Signed: 04/11/2020 5:35:25 PM By: Baruch Gouty RN, BSN Entered By: Baruch Gouty on 04/11/2020 15:33:49 -------------------------------------------------------------------------------- Encounter Discharge Information Details Patient Name: Date of Service: [name], [name] 04/11/2020 2:15 PM Medical Record Number: [id number] Patient Account Number: [id number] Date of Birth/Sex: Treating RN: 26-Dec-1960 (59 y.o. Elam Dutch Primary Care Laurent Cargile: Olivia Canter, [place] Other Clinician: Referring Kayelee Herbig: Treating Ewald Beg/Extender: Gale Journey, ZO E Weeks in Treatment: 165 Encounter Discharge Information Items Discharge Condition: Stable Ambulatory Status: Wheelchair Discharge Destination: Home Transportation: Private Auto Accompanied By: self Schedule Follow-up Appointment: Yes Clinical Summary of Care: Electronic Signature(s) Signed: 04/11/2020 4:59:21 PM By: Deon Pilling Entered By: Deon Pilling on 04/11/2020 15:44:51 -------------------------------------------------------------------------------- Multi Wound Chart Details Patient Name: Date of Service: [name], [name] 04/11/2020 2:15 PM Medical Record Number: [id number] Patient Account Number: [id number] Date of Birth/Sex: Treating RN: October 23, 1961 (59 y.o. Elam Dutch Primary Care Adlai Nieblas: Olivia Canter, ZO E Other Clinician: Referring Jataya Wann: Treating Kirandeep Fariss/Extender: Gale Journey, ZO E Weeks in Treatment: 165 Vital Signs Height(in): 67 Pulse(bpm): 79 Weight(lbs): 220 Blood Pressure(mmHg): 126/65 Body Mass Index(BMI): 34 Temperature(F): 98.2 Respiratory Rate(breaths/min): 16 Photos: [1:No Photos Right Ischium] [N/A:N/A N/A] Wound Location: [1:Pressure Injury] [N/A:N/A] Wounding Event: [1:Pressure Ulcer] [N/A:N/A] Primary Etiology: [1:Hypertension, Type II Diabetes,] [N/A:N/A] Comorbid History: [1:Osteoarthritis, Neuropathy 12/06/2005] [N/A:N/A] Date Acquired: [6:720] [N/A:N/A] Weeks of Treatment: [1:Open] [N/A:N/A] Wound Status: [1:Yes] [N/A:N/A] Clustered Wound: [1:1] [N/A:N/A] Clustered Quantity: [1:6x9.5x0.3] [N/A:N/A] Measurements L x W x D (cm) [1:44.768] [N/A:N/A] A (cm) : rea [1:13.43] [N/A:N/A] Volume (cm) : [1:-137.50%] [N/A:N/A] % Reduction in A rea: [1:-137.50%] [N/A:N/A] % Reduction in Volume: [1:Category/Stage III] [N/A:N/A] Classification: [1:Medium] [N/A:N/A] Exudate A mount: [1:Serosanguineous] [N/A:N/A] Exudate Type: [1:red, brown] [N/A:N/A] Exudate Color: [1:Flat and Intact] [N/A:N/A] Wound Margin:  [1:Large (67-100%)] [N/A:N/A] Granulation A mount: [1:Pink] [N/A:N/A] Granulation Quality: [1:Small (1-33%)] [N/A:N/A] Necrotic A mount: [1:Fat Layer (Subcutaneous Tissue)] [N/A:N/A] Exposed Structures: [1:Exposed: Yes Fascia: No Tendon: No Muscle: No Joint: No Bone: No Small (1-33%)] [N/A:N/A] Treatment Notes Wound #1 (Right Ischium) 1. Cleanse With Wound Cleanser 2. Periwound Care Skin Prep 3. Primary Dressing Applied Calcium Alginate Ag 4. Secondary Dressing ABD Pad 5. Secured With [employer]  Signature(s) Signed: 04/11/2020 4:59:38 PM By: Linton Ham MD Signed: 04/11/2020 5:35:25 PM By: Baruch Gouty RN, BSN Entered By: Linton Ham on 04/11/2020 16:39:09 -------------------------------------------------------------------------------- Multi-Disciplinary Care Plan Details Patient Name: Date of Service: [name], [place] [name] 04/11/2020 2:15 PM Medical Record Number: [id number] Patient Account Number: [id number] Date of Birth/Sex: Treating RN: February 06, 1961 (59 y.o. Elam Dutch Primary Care Nakayla Rorabaugh: Olivia Canter, ZO E Other Clinician: Referring Amadi Frady: Treating Ronald Londo/Extender: Gale Journey, ZO E Weeks in Treatment: 165 Active Inactive Pressure Nursing Diagnoses: Knowledge deficit related to causes and risk factors for pressure ulcer development Goals: Patient will remain free from development of additional pressure ulcers Date Initiated: 10/27/2018 Date Inactivated: 12/05/2019 Target Resolution Date: 11/23/2019 Goal Status: Met Patient/caregiver will verbalize risk factors for pressure ulcer development Date Initiated: 02/11/2017 Date Inactivated: 10/27/2018 Target Resolution Date: 11/29/2018 Goal Status: Met Patient/caregiver will verbalize understanding of pressure ulcer management Date Initiated: 12/05/2019 Target Resolution Date: 05/09/2020 Goal Status: Active Interventions: Assess: immobility, friction, shearing, incontinence  upon admission and as needed Notes: Wound/Skin Impairment Nursing Diagnoses: Impaired tissue integrity Knowledge deficit related to ulceration/compromised skin integrity Goals: Patient/caregiver will verbalize understanding of skin care regimen Date Initiated: 11/11/2017 Target Resolution Date: 05/09/2020 Goal Status: Active Interventions: Assess patient/caregiver ability to obtain necessary supplies Assess patient/caregiver ability to perform ulcer/skin care regimen upon admission and as needed Assess ulceration(s) every visit Treatment Activities: Patient referred to home care : 11/11/2017 Skin care regimen initiated : 11/11/2017 Topical wound management initiated : 11/11/2017 Notes: Electronic Signature(s) Signed: 04/11/2020 5:35:25 PM By: Baruch Gouty RN, BSN Entered By: Baruch Gouty on 04/11/2020 15:32:16 -------------------------------------------------------------------------------- Pain Assessment Details Patient Name: Date of Service: [name], [name] 04/11/2020 2:15 PM Medical Record Number: [id number] Patient Account Number: [id number] Date of Birth/Sex: Treating RN: [birth year] (59 y.o. Elam Dutch Primary Care Noella Kipnis: Olivia Canter, ZO E Other Clinician: Referring Ximena Todaro: Treating Maciah Schweigert/Extender: Gale Journey, ZO E Weeks in Treatment: 165 Active Problems Location of Pain Severity and Description of Pain Patient Has Paino Yes Site Locations Rate the pain. Rate the pain. Current Pain Level: 3 Pain Management and Medication Current Pain Management: Electronic Signature(s) Signed: 04/11/2020 5:35:25 PM By: Baruch Gouty RN, BSN Signed: 04/15/2020 8:52:48 AM By: Sandre Kitty Entered By: Sandre Kitty on 04/11/2020 14:49:08 -------------------------------------------------------------------------------- Patient/Caregiver Education Details Patient Name: Date of Service: [name] Brooks 5/7/2021andnbsp2:15 PM Medical Record Number:  [id number] Patient Account Number: [id number] Date of Birth/Gender: Treating RN: 1961/03/27 (59 y.o. Elam Dutch Primary Care Physician: Olivia Canter, [place] Other Clinician: Referring Physician: Treating Physician/Extender: Marcille Buffy Weeks in Treatment: 165 Education Assessment Education Provided To: Patient Education Topics Provided Pressure: Methods: Explain/Verbal Responses: Reinforcements needed, State content correctly Wound/Skin Impairment: Methods: Explain/Verbal Responses: Reinforcements needed, State content correctly Electronic Signature(s) Signed: 04/11/2020 5:35:25 PM By: Baruch Gouty RN, BSN Entered By: Baruch Gouty on 04/11/2020 15:33:08 -------------------------------------------------------------------------------- Wound Assessment Details Patient Name: Date of Service: [name] [name] 04/11/2020 2:15 PM Medical Record Number: [id number] Patient Account Number: [id number] Date of Birth/Sex: Treating RN: 25-Oct-1961 (59 y.o. Elam Dutch Primary Care Servando Kyllonen: Olivia Canter, ZO E Other Clinician: Referring Deryn Massengale: Treating Clifford Benninger/Extender: Gale Journey, ZO E Weeks in Treatment: 165 Wound Status Wound Number: 1 Primary Etiology: Pressure Ulcer Wound Location: Right Ischium Wound Status: Open Wounding Event: Pressure Injury Comorbid History: Hypertension, Type II Diabetes, Osteoarthritis, Neuropathy Date Acquired: 12/06/2005 Weeks Of Treatment: 165 Clustered Wound: Yes Photos Photo Uploaded By:  Mikeal Hawthorne on 04/14/2020 09:01:04 Wound Measurements Length: (cm) 6 Width: (cm) 9.5 Depth: (cm) 0.3 Clustered Quantity: 1 Area: (cm) 44.768 Volume: (cm) 13.43 % Reduction in Area: -137.5% % Reduction in Volume: -137.5% Epithelialization: Small (1-33%) Tunneling: No Undermining: No Wound Description Classification: Category/Stage III Wound Margin: Flat and Intact Exudate Amount: Medium Exudate Type:  Serosanguineous Exudate Color: red, brown Foul Odor After Cleansing: No Slough/Fibrino Yes Wound Bed Granulation Amount: Large (67-100%) Exposed Structure Granulation Quality: Pink Fascia Exposed: No Necrotic Amount: Small (1-33%) Fat Layer (Subcutaneous Tissue) Exposed: Yes Necrotic Quality: Adherent Slough Tendon Exposed: No Muscle Exposed: No Joint Exposed: No Bone Exposed: No Treatment Notes Wound #1 (Right Ischium) 1. Cleanse With Wound Cleanser 2. Periwound Care Skin Prep 3. Primary Dressing Applied Calcium Alginate Ag 4. Secondary Dressing ABD Pad 5. Secured With [employer]) Signed: 04/11/2020 5:35:25 PM By: Baruch Gouty RN, BSN Signed: 04/15/2020 8:52:48 AM By: Sandre Kitty Signed: 04/15/2020 8:52:48 AM By: Sandre Kitty Entered By: Sandre Kitty on 04/11/2020 15:01:32 -------------------------------------------------------------------------------- Vitals Details Patient Name: Date of Service: [name], [place] [name] 04/11/2020 2:15 PM Medical Record Number: [id number] Patient Account Number: [id number] Date of Birth/Sex: Treating RN: Feb 03, 1961 (59 y.o. Elam Dutch Primary Care Hattye Siegfried: Olivia Canter, ZO E Other Clinician: Referring Ahmaad Neidhardt: Treating Sincere Liuzzi/Extender: Gale Journey, ZO E Weeks in Treatment: 165 Vital Signs Time Taken: 14:48 Temperature (F): 98.2 Height (in): 67 Pulse (bpm): 79 Weight (lbs): 220 Respiratory Rate (breaths/min): 16 Body Mass Index (BMI): 34.5 Blood Pressure (mmHg): 126/65 Reference Range: 80 - 120 mg / dl Electronic Signature(s) Signed: 04/15/2020 8:52:48 AM By: Sandre Kitty Entered By: Sandre Kitty on 04/11/2020 14:49:01

## 2020-04-16 ENCOUNTER — Telehealth: Payer: Self-pay | Admitting: Neurology

## 2020-04-16 DIAGNOSIS — G35 Multiple sclerosis: Secondary | ICD-10-CM

## 2020-04-16 NOTE — Telephone Encounter (Signed)
When you get a chance can you put two new orders in for the MRI's for [facility]. Because the patient is in a wheel chair she has to go to [facility] for the lift. Thank you!

## 2020-04-16 NOTE — Telephone Encounter (Signed)
Patient is scheduled at [facility] for 05/01/20 arrival time is 2:30 pm. Her daughter is aware from the dpr. I also gave her their number of [phone] incase she needs to r/s.

## 2020-04-17 NOTE — Telephone Encounter (Signed)
Has these papers been refaxed yet

## 2020-04-17 NOTE — Telephone Encounter (Signed)
Pt is needing form refaxed she is saying they have not received this form. She states it's concerning the wheelchair cushion and foot pedal. Please advise pt at [phone].

## 2020-04-18 NOTE — Telephone Encounter (Signed)
Spoke with [name] at [employer], pt form has already been received. Pt is due to have wheelchair repaired on 05/07/20. Pt is aware of this now. No other action required from this office at time per [facility].

## 2020-04-21 ENCOUNTER — Other Ambulatory Visit: Payer: Self-pay | Admitting: Family Medicine

## 2020-04-21 DIAGNOSIS — G35 Multiple sclerosis: Secondary | ICD-10-CM

## 2020-04-21 NOTE — Telephone Encounter (Signed)
Requested Prescriptions  Pending Prescriptions Disp Refills  . hydrochlorothiazide (HYDRODIURIL) 25 MG tablet [Pharmacy Med Name: hydroCHLOROthiazide Oral Tablet 25 MG 25 MG  Tablet] 90 tablet 0    Sig: TAKE 1 TABLET BY MOUTH ONCE DAILY EVERY MORNING     Cardiovascular: Diuretics - Thiazide Failed - 04/21/2020 12:49 PM      Failed - K in normal range and within 360 days    Potassium  Date Value Ref Range Status  01/17/2020 3.4 (L) 3.5 - 5.1 mmol/L Final         Failed - Last BP in normal range    BP Readings from Last 1 Encounters:  03/28/20 (!) 145/64         Passed - Ca in normal range and within 360 days    Calcium  Date Value Ref Range Status  01/17/2020 9.0 8.9 - 10.3 mg/dL Final         Passed - Cr in normal range and within 360 days    Creatinine, Ser  Date Value Ref Range Status  01/17/2020 0.82 0.44 - 1.00 mg/dL Final         Passed - Na in normal range and within 360 days    Sodium  Date Value Ref Range Status  01/17/2020 139 135 - 145 mmol/L Final  12/19/2019 141 134 - 144 mmol/L Final         Passed - Valid encounter within last 6 months    Recent Outpatient Visits          1 month ago Medicare annual wellness visit, subsequent   Primary Care at [facility], [name], MD   1 month ago Screening for colon cancer   Primary Care at [facility], Zoe A, MD   3 months ago Type 2 diabetes mellitus with diabetic polyneuropathy, without long-term current use of insulin ([place])   Primary Care at [facility], [name], MD   4 months ago Chronically on opiate therapy   Primary Care at [facility], [name], MD   9 months ago Multiple sclerosis [facility])   Primary Care at [place], [place]      Future Appointments            In 2 months Forrest Moron, MD Primary Care at [place], [place]           . baclofen (LIORESAL) 20 MG tablet [Pharmacy Med Name: Baclofen Oral Tablet 20 MG 20 MG  Tablet] 120 tablet     Sig: TAKE 1 TABLET BY MOUTH FOUR TIMES  A DAY     Not Delegated - Analgesics:  Muscle Relaxants Failed - 04/21/2020 12:49 PM      Failed - This refill cannot be delegated      Passed - Valid encounter within last 6 months    Recent Outpatient Visits          1 month ago Medicare annual wellness visit, subsequent   Primary Care at [facility], [name], MD   1 month ago Screening for colon cancer   Primary Care at [facility], [place] A, MD   3 months ago Type 2 diabetes mellitus with diabetic polyneuropathy, without long-term current use of insulin ([place])   Primary Care at [facility], [name], MD   4 months ago Chronically on opiate therapy   Primary Care at [facility], [name], MD   9 months ago Multiple sclerosis [facility])   Primary Care at [facility], [place]  A, MD      Future Appointments            In 2 months Forrest Moron, MD Primary Care at [place], [facility]

## 2020-04-21 NOTE — Telephone Encounter (Signed)
Requested medication (s) are due for refill today: no  Requested medication (s) are on the active medication list: yes  Last refill:  12/19/19  Future visit scheduled: yes  Notes to clinic:  Medication not delegated to NT to refill, transfer or refuse    Requested Prescriptions  Pending Prescriptions Disp Refills   baclofen (LIORESAL) 20 MG tablet [Pharmacy Med Name: Baclofen Oral Tablet 20 MG 20 MG  Tablet] 120 tablet     Sig: TAKE 1 TABLET BY MOUTH FOUR TIMES A DAY      Not Delegated - Analgesics:  Muscle Relaxants Failed - 04/21/2020 12:49 PM      Failed - This refill cannot be delegated      Passed - Valid encounter within last 6 months    Recent Outpatient Visits           1 month ago Medicare annual wellness visit, subsequent   Primary Care at [facility], Zoe A, MD   1 month ago Screening for colon cancer   Primary Care at [facility], [place] A, MD   3 months ago Type 2 diabetes mellitus with diabetic polyneuropathy, without long-term current use of insulin ([place])   Primary Care at [facility], Zoe A, MD   4 months ago Chronically on opiate therapy   Primary Care at [facility], [name], MD   9 months ago Multiple sclerosis [facility])   Primary Care at [place], MD       Future Appointments             In 2 months Forrest Moron, MD Primary Care at [place], [facility]             Signed Prescriptions Disp Refills   hydrochlorothiazide (HYDRODIURIL) 25 MG tablet 90 tablet 0    Sig: TAKE 1 TABLET BY MOUTH ONCE DAILY EVERY MORNING      Cardiovascular: Diuretics - Thiazide Failed - 04/21/2020 12:49 PM      Failed - K in normal range and within 360 days    Potassium  Date Value Ref Range Status  01/17/2020 3.4 (L) 3.5 - 5.1 mmol/L Final          Failed - Last BP in normal range    BP Readings from Last 1 Encounters:  03/28/20 (!) 145/64          Passed - Ca in normal range and within 360 days    Calcium  Date Value Ref Range Status   01/17/2020 9.0 8.9 - 10.3 mg/dL Final          Passed - Cr in normal range and within 360 days    Creatinine, Ser  Date Value Ref Range Status  01/17/2020 0.82 0.44 - 1.00 mg/dL Final          Passed - Na in normal range and within 360 days    Sodium  Date Value Ref Range Status  01/17/2020 139 135 - 145 mmol/L Final  12/19/2019 141 134 - 144 mmol/L Final          Passed - Valid encounter within last 6 months    Recent Outpatient Visits           1 month ago Medicare annual wellness visit, subsequent   Primary Care at [facility], [name], MD   1 month ago Screening for colon cancer   Primary Care at [facility], Zoe A, MD   3 months ago Type 2 diabetes mellitus with  diabetic polyneuropathy, without long-term current use of insulin ([place])   Primary Care at [place], MD   4 months ago Chronically on opiate therapy   Primary Care at [facility], [name], MD   9 months ago Multiple sclerosis [facility])   Primary Care at [place], MD       Future Appointments             In 2 months Forrest Moron, MD Primary Care at [place], [facility]

## 2020-04-25 DIAGNOSIS — N318 Other neuromuscular dysfunction of bladder: Secondary | ICD-10-CM | POA: Diagnosis not present

## 2020-04-28 ENCOUNTER — Other Ambulatory Visit: Payer: Self-pay | Admitting: Family Medicine

## 2020-04-28 DIAGNOSIS — Z79891 Long term (current) use of opiate analgesic: Secondary | ICD-10-CM

## 2020-04-28 DIAGNOSIS — G35 Multiple sclerosis: Secondary | ICD-10-CM

## 2020-04-28 MED ORDER — OXYCODONE-ACETAMINOPHEN 10-325 MG PO TABS: 1.0000 | ORAL_TABLET | Freq: Three times a day (TID) | ORAL | 0 refills | Status: DC | PRN

## 2020-05-01 ENCOUNTER — Other Ambulatory Visit: Payer: Self-pay

## 2020-05-01 ENCOUNTER — Ambulatory Visit (HOSPITAL_COMMUNITY)
Admission: RE | Admit: 2020-05-01 | Discharge: 2020-05-01 | Disposition: A | Discharge status: Home / Self Care | Payer: Medicare Other | Source: Ambulatory Visit | Attending: Neurology | Admitting: Neurology

## 2020-05-01 DIAGNOSIS — D329 Benign neoplasm of meninges, unspecified: Secondary | ICD-10-CM | POA: Diagnosis not present

## 2020-05-01 DIAGNOSIS — G35 Multiple sclerosis: Secondary | ICD-10-CM

## 2020-05-01 DIAGNOSIS — M5021 Other cervical disc displacement,  high cervical region: Secondary | ICD-10-CM | POA: Diagnosis not present

## 2020-05-02 ENCOUNTER — Telehealth: Payer: Self-pay

## 2020-05-02 NOTE — Telephone Encounter (Signed)
Received some orders for [employer] signed and faxed back with confirmation [phone]

## 2020-05-06 ENCOUNTER — Telehealth: Payer: Self-pay | Admitting: Neurology

## 2020-05-06 DIAGNOSIS — D329 Benign neoplasm of meninges, unspecified: Secondary | ICD-10-CM

## 2020-05-06 DIAGNOSIS — G8220 Paraplegia, unspecified: Secondary | ICD-10-CM

## 2020-05-06 DIAGNOSIS — G35 Multiple sclerosis: Secondary | ICD-10-CM

## 2020-05-06 NOTE — Telephone Encounter (Signed)
I discussed the MRI of the brain and cervical spine with [name].  The MRI of the brain showed T2/FLAIR hyperintense foci in the cerebellum, left middle cerebellar peduncle and hemispheres consistent with chronic demyelinating plaque associated with multiple sclerosis.  Plaque burden was fairly low.   The MRI also shows a large (40 mm in longest diameter) extra-axial focus consistent with a meningioma.  It is located at the vertex on the left and causes mass-effect on the underlying left posterior frontal and parietal lobes.  There is no edema.  The MRI of the cervical spine shows T2 hyperintense foci within the spinal cord adjacent to C2 and C3-C4.  Image quality is poor and there could be several other foci.  Additionally, there is moderate spinal stenosis at C3-C4, moderately severe spinal stenosis at C4-C5, moderate spinal stenosis at C5-C6 and moderate spinal stenosis at C6-C7.  These are due to disc protrusions, uncovertebral spurring, ligamenta flava hypertrophy and congenitally short pedicles.  Clinically, she had a large MS exacerbation around 2007 with loss of gait requiring a walker.  She gradually worsened.  Over the last couple of years she has had progressive right-sided weakness.  I discussed with [name] that the MS was probably responsible for her symptoms in 2007 but that I am concerned that the meningioma is playing a bigger role in her more recent progressive right-sided weakness.  The spinal stenosis could also be playing some role in her symptoms though there was no definite myelopathic signal in the anterior horns.  I let her know that I would be referring her to see neurosurgery for consultation and possible surgery regarding the meningioma.  She also asked if I could call her son, [name], [phone]).  I discussed the above with him as well.

## 2020-05-07 ENCOUNTER — Other Ambulatory Visit: Payer: Self-pay | Admitting: *Deleted

## 2020-05-07 DIAGNOSIS — G801 Spastic diplegic cerebral palsy: Secondary | ICD-10-CM

## 2020-05-07 DIAGNOSIS — G35 Multiple sclerosis: Secondary | ICD-10-CM

## 2020-05-07 NOTE — Progress Notes (Signed)
Called pt to see if she had a preference on where we send order for hospital bed. She did not have the info at the time of call. She will call back and let me know.

## 2020-05-08 NOTE — Progress Notes (Signed)
Pt called back. She would like order faxed to [facility] in [place], [place]. Phone: [phone]. I called and this was to [employer]. She did not have fax.  I called [facility] at [phone] that was listed online. Fax: [phone]. Faxed order, received confirmation.

## 2020-05-09 ENCOUNTER — Encounter (HOSPITAL_BASED_OUTPATIENT_CLINIC_OR_DEPARTMENT_OTHER): Payer: Medicare Other | Attending: Internal Medicine | Admitting: Internal Medicine

## 2020-05-09 ENCOUNTER — Other Ambulatory Visit: Payer: Self-pay

## 2020-05-09 DIAGNOSIS — L89313 Pressure ulcer of right buttock, stage 3: Secondary | ICD-10-CM | POA: Insufficient documentation

## 2020-05-09 DIAGNOSIS — G35 Multiple sclerosis: Secondary | ICD-10-CM | POA: Insufficient documentation

## 2020-05-09 DIAGNOSIS — Z993 Dependence on wheelchair: Secondary | ICD-10-CM | POA: Insufficient documentation

## 2020-05-12 NOTE — Progress Notes (Signed)
FREDNA, STRICKER ([id number]) Visit Report for 05/09/2020 Arrival Information Details Patient Name: Date of Service: [name] [facility] 05/09/2020 1:15 PM Medical Record Number: [id number] Patient Account Number: [id number] Date of Birth/Sex: Treating RN: September 07, 1961 (59 y.o. Helene Shoe, Meta.Reding Primary Care Gery Sabedra: Olivia Canter, ZO E Other Clinician: Referring Kaian Fahs: Treating Benny Henrie/Extender: Gale Journey, ZO E Weeks in Treatment: 94 Visit Information History Since Last Visit Added or deleted any medications: No Patient Arrived: Wheel Chair Any new allergies or adverse reactions: No Arrival Time: 13:15 Had a fall or experienced change in No Accompanied By: self activities of daily living that may affect Transfer Assistance: None risk of falls: Patient Identification Verified: Yes Signs or symptoms of abuse/neglect since last visito No Secondary Verification Process Completed: Yes Hospitalized since last visit: No Patient Requires Transmission-Based Precautions: No Implantable device outside of the clinic excluding No Patient Has Alerts: No cellular tissue based products placed in the center since last visit: Has Dressing in Place as Prescribed: Yes Pain Present Now: No Electronic Signature(s) Signed: 05/09/2020 4:34:00 PM By: Deon Pilling Entered By: Deon Pilling on 05/09/2020 13:23:32 -------------------------------------------------------------------------------- Clinic Level of Care Assessment Details Patient Name: Date of Service: [name] [facility] 05/09/2020 1:15 PM Medical Record Number: [id number] Patient Account Number: [id number] Date of Birth/Sex: Treating RN: [birth year] (59 y.o. [name] Primary Care Bera Pinela: Olivia Canter, ZO E Other Clinician: Referring Prakriti Carignan: Treating Jamarien Rodkey/Extender: Gale Journey, ZO E Weeks in Treatment: [facility] Level of Care Assessment Items TOOL 4 Quantity Score X- 1 0 Use when only an EandM is performed  on FOLLOW-UP visit ASSESSMENTS - Nursing Assessment / Reassessment X- 1 10 Reassessment of Co-morbidities (includes updates in patient status) X- 1 5 Reassessment of Adherence to Treatment Plan ASSESSMENTS - Wound and Skin A ssessment / Reassessment X - Simple Wound Assessment / Reassessment - one wound 1 5 _0  - 0 Complex Wound Assessment / Reassessment - multiple wounds _1  - 0 Dermatologic / Skin Assessment (not related to wound area) ASSESSMENTS - Focused Assessment _2  - 0 Circumferential Edema Measurements - multi extremities _3  - 0 Nutritional Assessment / Counseling / Intervention _4  - 0 Lower Extremity Assessment (monofilament, tuning fork, pulses) _5  - 0 Peripheral Arterial Disease Assessment (using hand held doppler) ASSESSMENTS - Ostomy and/or Continence Assessment and Care _6  - 0 Incontinence Assessment and Management _7  - 0 Ostomy Care Assessment and Management (repouching, etc.) PROCESS - Coordination of Care X - Simple Patient / Family Education for ongoing care 1 15 _8  - 0 Complex (extensive) Patient / Family Education for ongoing care X- 1 10 Staff obtains Programmer, systems, Records, T Results / Process Orders est _9  - 0 Staff telephones HHA, Nursing Homes / Clarify orders / etc _10  - 0 Routine Transfer to another Facility (non-emergent condition) _11  - 0 Routine Hospital Admission (non-emergent condition) _12  - 0 New Admissions / Biomedical engineer / Ordering NPWT Apligraf, etc. , _13  - 0 Emergency Hospital Admission (emergent condition) X- 1 10 Simple Discharge Coordination _14  - 0 Complex (extensive) Discharge Coordination PROCESS - Special Needs _15  - 0 Pediatric / Minor Patient Management _16  - 0 Isolation Patient Management _17  - 0 Hearing / Language / Visual special needs _18  - 0 Assessment of Community assistance (transportation, D/C planning, etc.) _19  - 0 Additional assistance / Altered mentation _20  - 0 Support Surface(s) Assessment (bed,  cushion, seat, etc.) INTERVENTIONS - Wound Cleansing / Measurement X - Simple Wound Cleansing - one wound 1 5 _21  -  0 Complex Wound Cleansing - multiple wounds X- 1 5 Wound Imaging (photographs - any number of wounds) _0  - 0 Wound Tracing (instead of photographs) X- 1 5 Simple Wound Measurement - one wound _1  - 0 Complex Wound Measurement - multiple wounds INTERVENTIONS - Wound Dressings X - Small Wound Dressing one or multiple wounds 1 10 _2  - 0 Medium Wound Dressing one or multiple wounds _3  - 0 Large Wound Dressing one or multiple wounds X- 1 5 Application of Medications - topical <YWVPXTGGYIRSWNIO>_2<\/[id number]>_1  - 0 Application of Medications - injection INTERVENTIONS - Miscellaneous _5  - 0 External ear exam _6  - 0 Specimen Collection (cultures, biopsies, blood, body fluids, etc.) _7  - 0 Specimen(s) / Culture(s) sent or taken to Lab for analysis X- 1 10 Patient Transfer (multiple staff / Civil Service fast streamer / Similar devices) _8  - 0 Simple Staple / Suture removal (25 or less) _9  - 0 Complex Staple / Suture removal (26 or more) _10  - 0 Hypo / Hyperglycemic Management (close monitor of Blood Glucose) _11  - 0 Ankle / Brachial Index (ABI) - do not check if billed separately X- 1 5 Vital Signs Has the patient been seen at the hospital within the last three years: Yes Total Score: 100 Level Of Care: New/Established - Level 3 Electronic Signature(s) Signed: 05/09/2020 4:23:17 PM By: Kela Millin Entered By: Kela Millin on 05/09/2020 13:48:43 -------------------------------------------------------------------------------- Encounter Discharge Information Details Patient Name: Date of Service: [name], [facility] 05/09/2020 1:15 PM Medical Record Number: [id number] Patient Account Number: [id number] Date of Birth/Sex: Treating RN: [birth year] (59 y.o. Debby Bud Primary Care Adaiah Jaskot: Olivia Canter, ZO E Other Clinician: Referring Tavonte Seybold: Treating Clayden Withem/Extender: Gale Journey, ZO  E Weeks in Treatment: 169 Encounter Discharge Information Items Discharge Condition: Stable Ambulatory Status: Wheelchair Discharge Destination: Home Transportation: Private Auto Accompanied By: self Schedule Follow-up Appointment: Yes Clinical Summary of Care: Electronic Signature(s) Signed: 05/09/2020 4:34:00 PM By: Deon Pilling Entered By: Deon Pilling on 05/09/2020 16:29:27 -------------------------------------------------------------------------------- Lower Extremity Assessment Details Patient Name: Date of Service: [name] [facility] 05/09/2020 1:15 PM Medical Record Number: [id number] Patient Account Number: [id number] Date of Birth/Sex: Treating RN: [birth year] (59 y.o. Debby Bud Primary Care Loyde Orth: Olivia Canter, ZO E Other Clinician: Referring Maynor Mwangi: Treating Markos Theil/Extender: Gale Journey, ZO E Weeks in Treatment: 169 Electronic Signature(s) Signed: 05/09/2020 4:34:00 PM By: Deon Pilling Entered By: Deon Pilling on 05/09/2020 13:24:03 -------------------------------------------------------------------------------- Multi Wound Chart Details Patient Name: Date of Service: Jaquita Rector [facility] 05/09/2020 1:15 PM Medical Record Number: [id number] Patient Account Number: [id number] Date of Birth/Sex: Treating RN: [birth year] (59 y.o. [name] Primary Care Jersie Beel: Olivia Canter, ZO E Other Clinician: Referring Jerelene Salaam: Treating Denny Lave/Extender: Gale Journey, ZO E Weeks in Treatment: 169 Vital Signs Height(in): 67 Pulse(bpm): 71 Weight(lbs): 220 Blood Pressure(mmHg): 130/74 Body Mass Index(BMI): 34 Temperature(F): 98.3 Respiratory Rate(breaths/min): 20 Photos: [1:No Photos Right Ischium] [N/A:N/A N/A] Wound Location: [1:Pressure Injury] [N/A:N/A] Wounding Event: [1:Pressure Ulcer] [N/A:N/A] Primary Etiology: [1:Hypertension, Type II Diabetes,] [N/A:N/A] Comorbid History: [1:Osteoarthritis, Neuropathy 12/06/2005]  [N/A:N/A] Date Acquired: [5:361] [N/A:N/A] Weeks of Treatment: [1:Open] [N/A:N/A] Wound Status: [1:Yes] [N/A:N/A] Clustered Wound: [1:2] [N/A:N/A] Clustered Quantity: [1:4.7x8.1x0.3] [N/A:N/A] Measurements L x W x D (cm) [1:29.9] [N/A:N/A] A (cm) : rea [1:8.97] [N/A:N/A] Volume (cm) : [1:-58.60%] [N/A:N/A] % Reduction in A rea: [1:-58.60%] [N/A:N/A] % Reduction in Volume: [1:Category/Stage III] [N/A:N/A] Classification: [1:Medium] [N/A:N/A] Exudate A mount: [1:Serosanguineous] [N/A:N/A] Exudate Type: [1:red, brown] [N/A:N/A] Exudate Color: [1:Flat and Intact] [N/A:N/A]  Wound Margin: [1:Large (67-100%)] [N/A:N/A] Granulation A mount: [1:Red, Pink] [N/A:N/A] Granulation Quality: [1:Small (1-33%)] [N/A:N/A] Necrotic A mount: [1:Fat Layer (Subcutaneous Tissue)] [N/A:N/A] Exposed Structures: [1:Exposed: Yes Fascia: No Tendon: No Muscle: No Joint: No Bone: No Small (1-33%)] [N/A:N/A] Treatment Notes Electronic Signature(s) Signed: 05/09/2020 4:23:17 PM By: Kela Millin Signed: 05/12/2020 5:22:40 PM By: Linton Ham MD Entered By: Linton Ham on 05/09/2020 13:48:56 -------------------------------------------------------------------------------- Multi-Disciplinary Care Plan Details Patient Name: Date of Service: [name], [place] SA LYN 05/09/2020 1:15 PM Medical Record Number: [id number] Patient Account Number: [id number] Date of Birth/Sex: Treating RN: 1961/07/31 (59 y.o. [name] Primary Care Tobie Perdue: Olivia Canter, ZO E Other Clinician: Referring Buddy Loeffelholz: Treating Kalli Greenfield/Extender: Gale Journey, ZO E Weeks in Treatment: 169 Active Inactive Pressure Nursing Diagnoses: Knowledge deficit related to causes and risk factors for pressure ulcer development Goals: Patient will remain free from development of additional pressure ulcers Date Initiated: 10/27/2018 Date Inactivated: 12/05/2019 Target Resolution Date: 11/23/2019 Goal Status:  Met Patient/caregiver will verbalize risk factors for pressure ulcer development Date Initiated: 02/11/2017 Date Inactivated: 10/27/2018 Target Resolution Date: 11/29/2018 Goal Status: Met Patient/caregiver will verbalize understanding of pressure ulcer management Date Initiated: 12/05/2019 Target Resolution Date: 06/13/2020 Goal Status: Active Interventions: Assess: immobility, friction, shearing, incontinence upon admission and as needed Notes: Wound/Skin Impairment Nursing Diagnoses: Impaired tissue integrity Knowledge deficit related to ulceration/compromised skin integrity Goals: Patient/caregiver will verbalize understanding of skin care regimen Date Initiated: 11/11/2017 Target Resolution Date: 06/13/2020 Goal Status: Active Interventions: Assess patient/caregiver ability to obtain necessary supplies Assess patient/caregiver ability to perform ulcer/skin care regimen upon admission and as needed Assess ulceration(s) every visit Treatment Activities: Patient referred to home care : 11/11/2017 Skin care regimen initiated : 11/11/2017 Topical wound management initiated : 11/11/2017 Notes: Electronic Signature(s) Signed: 05/09/2020 4:23:17 PM By: Kela Millin Entered By: Kela Millin on 05/09/2020 13:11:13 -------------------------------------------------------------------------------- Pain Assessment Details Patient Name: Date of Service: [name] LYN 05/09/2020 1:15 PM Medical Record Number: [id number] Patient Account Number: [id number] Date of Birth/Sex: Treating RN: [birth year] (59 y.o. Debby Bud Primary Care Micaila Ziemba: Olivia Canter, ZO E Other Clinician: Referring Lainy Wrobleski: Treating Tashira Torre/Extender: Gale Journey, ZO E Weeks in Treatment: 169 Active Problems Location of Pain Severity and Description of Pain Patient Has Paino No Site Locations Rate the pain. Rate the pain. Current Pain Level: 0 Pain Management and Medication Current Pain  Management: Medication: No Cold Application: No Rest: No Massage: No Activity: No T.E.N.S.: No Heat Application: No Leg drop or elevation: No Is the Current Pain Management Adequate: Adequate How does your wound impact your activities of daily livingo Sleep: No Bathing: No Appetite: No Relationship With Others: No Bladder Continence: No Emotions: No Bowel Continence: No Work: No Toileting: No Drive: No Dressing: No Hobbies: No Electronic Signature(s) Signed: 05/09/2020 4:34:00 PM By: Deon Pilling Entered By: Deon Pilling on 05/09/2020 13:23:58 -------------------------------------------------------------------------------- Patient/Caregiver Education Details Patient Name: Date of Service: [name] LYN 6/4/2021andnbsp1:15 PM Medical Record Number: [id number] Patient Account Number: [id number] Date of Birth/Gender: Treating RN: [birth year] (59 y.o. [name] Primary Care Physician: Olivia Canter, Lant[place] Other Clinician: Referring Physician: Treating Physician/Extender: Marcille Buffy Weeks in Treatment: 169 Education Assessment Education Provided To: Patient Education Topics Provided Wound/Skin Impairment: Handouts: Caring for Your Ulcer Methods: Explain/Verbal Responses: State content correctly Electronic Signature(s) Signed: 05/09/2020 4:23:17 PM By: Kela Millin Entered By: Kela Millin on 05/09/2020 13:11:30 -------------------------------------------------------------------------------- Wound Assessment Details Patient Name: Date of Service: Jaquita Rector  SA LYN 05/09/2020 1:15 PM Medical Record Number: [id number] Patient Account Number: [id number] Date of Birth/Sex: Treating RN: [birth year] (59 y.o. Debby Bud Primary Care Alaze Garverick: Olivia Canter, ZO E Other Clinician: Referring Olliver Boyadjian: Treating Dene Nazir/Extender: Gale Journey, ZO E Weeks in Treatment: 169 Wound Status Wound Number: 1 Primary Etiology:  Pressure Ulcer Wound Location: Right Ischium Wound Status: Open Wounding Event: Pressure Injury Comorbid History: Hypertension, Type II Diabetes, Osteoarthritis, Neuropathy Date Acquired: 12/06/2005 Weeks Of Treatment: 169 Clustered Wound: Yes Photos Photo Uploaded By: Mikeal Hawthorne on 05/12/2020 11:02:59 Wound Measurements Length: (cm) 4.7 Width: (cm) 8.1 Depth: (cm) 0.3 Clustered Quantity: 2 Area: (cm) 29. Volume: (cm) 8.9 % Reduction in Area: -58.6% % Reduction in Volume: -58.6% Epithelialization: Small (1-33%) Tunneling: No 9 Undermining: No 7 Wound Description Classification: Category/Stage III Wound Margin: Flat and Intact Exudate Amount: Medium Exudate Type: Serosanguineous Exudate Color: red, brown Foul Odor After Cleansing: No Slough/Fibrino Yes Wound Bed Granulation Amount: Large (67-100%) Exposed Structure Granulation Quality: Red, Pink Fascia Exposed: No Necrotic Amount: Small (1-33%) Fat Layer (Subcutaneous Tissue) Exposed: Yes Necrotic Quality: Adherent Slough Tendon Exposed: No Muscle Exposed: No Joint Exposed: No Bone Exposed: No Treatment Notes Wound #1 (Right Ischium) 1. Cleanse With Wound Cleanser 3. Primary Dressing Applied Calcium Alginate Ag 4. Secondary Dressing ABD Pad 5. Secured With [employer] [facility] [employer]) Signed: 05/09/2020 4:34:00 PM By: Deon Pilling Entered By: Deon Pilling on 05/09/2020 13:28:44 -------------------------------------------------------------------------------- Vitals Details Patient Name: Date of Service: [name], RO SA LYN 05/09/2020 1:15 PM Medical Record Number: [id number] Patient Account Number: [id number] Date of Birth/Sex: Treating RN: 06-08-1961 (59 y.o. Helene Shoe, Meta.Reding Primary Care Loukas Antonson: Olivia Canter, ZO E Other Clinician: Referring Hamzah Savoca: Treating Tien Aispuro/Extender: Gale Journey, ZO E Weeks in Treatment: 169 Vital Signs Time Taken: 13:20 Temperature (F):  98.3 Height (in): 67 Pulse (bpm): 71 Weight (lbs): 220 Respiratory Rate (breaths/min): 20 Body Mass Index (BMI): 34.5 Blood Pressure (mmHg): 130/74 Reference Range: 80 - 120 mg / dl Electronic Signature(s) Signed: 05/09/2020 4:34:00 PM By: Deon Pilling Entered By: Deon Pilling on 05/09/2020 13:23:48
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
## 2020-05-16 DIAGNOSIS — N318 Other neuromuscular dysfunction of bladder: Secondary | ICD-10-CM | POA: Diagnosis not present

## 2020-05-19 DIAGNOSIS — D320 Benign neoplasm of cerebral meninges: Secondary | ICD-10-CM | POA: Insufficient documentation

## 2020-05-19 DIAGNOSIS — I10 Essential (primary) hypertension: Secondary | ICD-10-CM | POA: Diagnosis not present

## 2020-05-21 ENCOUNTER — Ambulatory Visit (INDEPENDENT_AMBULATORY_CARE_PROVIDER_SITE_OTHER): Payer: Medicare Other | Admitting: Podiatry

## 2020-05-21 ENCOUNTER — Other Ambulatory Visit: Payer: Self-pay

## 2020-05-21 DIAGNOSIS — L84 Corns and callosities: Secondary | ICD-10-CM | POA: Diagnosis not present

## 2020-05-21 DIAGNOSIS — G35 Multiple sclerosis: Secondary | ICD-10-CM

## 2020-05-21 DIAGNOSIS — B351 Tinea unguium: Secondary | ICD-10-CM | POA: Diagnosis not present

## 2020-05-21 DIAGNOSIS — E0842 Diabetes mellitus due to underlying condition with diabetic polyneuropathy: Secondary | ICD-10-CM

## 2020-05-21 NOTE — Patient Instructions (Signed)
Onychomycosis/Fungal Toenails  WHAT IS IT? An infection that lies within the keratin of your nail plate that is caused by a fungus.  WHY ME? Fungal infections affect all ages, sexes, races, and creeds.  There may be many factors that predispose you to a fungal infection such as age, coexisting medical conditions such as diabetes, or an autoimmune disease; stress, medications, fatigue, genetics, etc.  Bottom line: fungus thrives in a warm, moist environment and your shoes offer such a location.  IS IT CONTAGIOUS? Theoretically, yes.  You do not want to share shoes, nail clippers or files with someone who has fungal toenails.  Walking around barefoot in the same room or sleeping in the same bed is unlikely to transfer the organism.  It is important to realize, however, that fungus can spread easily from one nail to the next on the same foot.  HOW DO WE TREAT THIS?  There are several ways to treat this condition.  Treatment may depend on many factors such as age, medications, pregnancy, liver and kidney conditions, etc.  It is best to ask your doctor which options are available to you.  1. No treatment.   Unlike many other medical concerns, you can live with this condition.  However for many people this can be a painful condition and may lead to ingrown toenails or a bacterial infection.  It is recommended that you keep the nails cut short to help reduce the amount of fungal nail. 2. Topical treatment.  These range from herbal remedies to prescription strength nail lacquers.  About 40-50% effective, topicals require twice daily application for approximately 9 to 12 months or until an entirely new nail has grown out.  The most effective topicals are medical grade medications available through physicians offices. 3. Oral antifungal medications.  With an 80-90% cure rate, the most common oral medication requires 3 to 4 months of therapy and stays in your system for a year as the new nail grows out.  Oral  antifungal medications do require blood work to make sure it is a safe drug for you.  A liver function panel will be performed prior to starting the medication and after the first month of treatment.  It is important to have the blood work performed to avoid any harmful side effects.  In general, this medication safe but blood work is required. 4. Laser Therapy.  This treatment is performed by applying a specialized laser to the affected nail plate.  This therapy is noninvasive, fast, and non-painful.  It is not covered by insurance and is therefore, out of pocket.  The results have been very good with a 80-95% cure rate.  [facility] is the only practice in the area to offer this therapy. 5. Permanent Nail Avulsion.  Removing the entire nail so that a new nail will not grow back.  Corns and Calluses Corns are small areas of thickened skin that occur on the top, sides, or tip of a toe. They contain a cone-shaped core with a point that can press on a nerve below. This causes pain.  Calluses are areas of thickened skin that can occur anywhere on the body, including the hands, fingers, palms, soles of the feet, and heels. Calluses are usually larger than corns. What are the causes? Corns and calluses are caused by rubbing (friction) or pressure, such as from shoes that are too tight or do not fit properly. What increases the risk? Corns are more likely to develop in people   have misshapen toes (toe deformities), such as hammer toes. Calluses can occur with friction to any area of the skin. They are more likely to develop in people who:  Work with their hands.  Wear shoes that fit poorly, are too tight, or are high-heeled.  Have toe deformities. What are the signs or symptoms? Symptoms of a corn or callus include:  A hard growth on the skin.  Pain or tenderness under the skin.  Redness and swelling.  Increased discomfort while wearing tight-fitting shoes, if your feet are  affected. If a corn or callus becomes infected, symptoms may include:  Redness and swelling that gets worse.  Pain.  Fluid, blood, or pus draining from the corn or callus. How is this diagnosed? Corns and calluses may be diagnosed based on your symptoms, your medical history, and a physical exam. How is this treated? Treatment for corns and calluses may include:  Removing the cause of the friction or pressure. This may involve: ? Changing your shoes. ? Wearing shoe inserts (orthotics) or other protective layers in your shoes, such as a corn pad. ? Wearing gloves.  Applying medicine to the skin (topical medicine) to help soften skin in the hardened, thickened areas.  Removing layers of dead skin with a file to reduce the size of the corn or callus.  Removing the corn or callus with a scalpel or laser.  Taking antibiotic medicines, if your corn or callus is infected.  Having surgery, if a toe deformity is the cause. Follow these instructions at home:   Take over-the-counter and prescription medicines only as told by your health care provider.  If you were prescribed an antibiotic, take it as told by your health care provider. Do not stop taking it even if your condition starts to improve.  Wear shoes that fit well. Avoid wearing high-heeled shoes and shoes that are too tight or too loose.  Wear any padding, protective layers, gloves, or orthotics as told by your health care provider.  Soak your hands or feet and then use a file or pumice stone to soften your corn or callus. Do this as told by your health care provider.  Check your corn or callus every day for symptoms of infection. Contact a health care provider if you:  Notice that your symptoms do not improve with treatment.  Have redness or swelling that gets worse.  Notice that your corn or callus becomes painful.  Have fluid, blood, or pus coming from your corn or callus.  Have new symptoms. Summary  Corns are  small areas of thickened skin that occur on the top, sides, or tip of a toe.  Calluses are areas of thickened skin that can occur anywhere on the body, including the hands, fingers, palms, and soles of the feet. Calluses are usually larger than corns.  Corns and calluses are caused by rubbing (friction) or pressure, such as from shoes that are too tight or do not fit properly.  Treatment may include wearing any padding, protective layers, gloves, or orthotics as told by your health care provider. This information is not intended to replace advice given to you by your health care provider. Make sure you discuss any questions you have with your health care provider. Document Revised: 03/14/2019 Document Reviewed: 10/05/2017 Elsevier Patient Education  [place].  Diabetes Mellitus and [place] care is an important part of your health, especially when you have diabetes. Diabetes may cause you to have problems because of poor blood  flow (circulation) to your feet and legs, which can cause your skin to:  Become thinner and drier.  Break more easily.  Heal more slowly.  Peel and crack. You may also have nerve damage (neuropathy) in your legs and feet, causing decreased feeling in them. This means that you may not notice minor injuries to your feet that could lead to more serious problems. Noticing and addressing any potential problems early is the best way to prevent future foot problems. How to care for your feet Foot hygiene  Wash your feet daily with warm water and mild soap. Do not use hot water. Then, pat your feet and the areas between your toes until they are completely dry. Do not soak your feet as this can dry your skin.  Trim your toenails straight across. Do not dig under them or around the cuticle. File the edges of your nails with an emery board or nail file.  Apply a moisturizing lotion or petroleum jelly to the skin on your feet and to dry, brittle toenails. Use  lotion that does not contain alcohol and is unscented. Do not apply lotion between your toes. Shoes and socks  Wear clean socks or stockings every day. Make sure they are not too tight. Do not wear knee-high stockings since they may decrease blood flow to your legs.  Wear shoes that fit properly and have enough cushioning. Always look in your shoes before you put them on to be sure there are no objects inside.  To break in new shoes, wear them for just a few hours a day. This prevents injuries on your feet. Wounds, scrapes, corns, and calluses  Check your feet daily for blisters, cuts, bruises, sores, and redness. If you cannot see the bottom of your feet, use a mirror or ask someone for help.  Do not cut corns or calluses or try to remove them with medicine.  If you find a minor scrape, cut, or break in the skin on your feet, keep it and the skin around it clean and dry. You may clean these areas with mild soap and water. Do not clean the area with peroxide, alcohol, or iodine.  If you have a wound, scrape, corn, or callus on your foot, look at it several times a day to make sure it is healing and not infected. Check for: ? Redness, swelling, or pain. ? Fluid or blood. ? Warmth. ? Pus or a bad smell. General instructions  Do not cross your legs. This may decrease blood flow to your feet.  Do not use heating pads or hot water bottles on your feet. They may burn your skin. If you have lost feeling in your feet or legs, you may not know this is happening until it is too late.  Protect your feet from hot and cold by wearing shoes, such as at the beach or on hot pavement.  Schedule a complete foot exam at least once a year (annually) or more often if you have foot problems. If you have foot problems, report any cuts, sores, or bruises to your health care provider immediately. Contact a health care provider if:  You have a medical condition that increases your risk of infection and you  have any cuts, sores, or bruises on your feet.  You have an injury that is not healing.  You have redness on your legs or feet.  You feel burning or tingling in your legs or feet.  You have pain or cramps in your  legs and feet.  Your legs or feet are numb.  Your feet always feel cold.  You have pain around a toenail. Get help right away if:  You have a wound, scrape, corn, or callus on your foot and: ? You have pain, swelling, or redness that gets worse. ? You have fluid or blood coming from the wound, scrape, corn, or callus. ? Your wound, scrape, corn, or callus feels warm to the touch. ? You have pus or a bad smell coming from the wound, scrape, corn, or callus. ? You have a fever. ? You have a red line going up your leg. Summary  Check your feet every day for cuts, sores, red spots, swelling, and blisters.  Moisturize feet and legs daily.  Wear shoes that fit properly and have enough cushioning.  If you have foot problems, report any cuts, sores, or bruises to your health care provider immediately.  Schedule a complete foot exam at least once a year (annually) or more often if you have foot problems. This information is not intended to replace advice given to you by your health care provider. Make sure you discuss any questions you have with your health care provider. Document Revised: 08/15/2019 Document Reviewed: 12/24/2016 Elsevier Patient Education  [place].

## 2020-05-28 DIAGNOSIS — L89309 Pressure ulcer of unspecified buttock, unspecified stage: Secondary | ICD-10-CM | POA: Diagnosis not present

## 2020-05-28 DIAGNOSIS — E1142 Type 2 diabetes mellitus with diabetic polyneuropathy: Secondary | ICD-10-CM | POA: Diagnosis not present

## 2020-05-28 DIAGNOSIS — Z960 Presence of urogenital implants: Secondary | ICD-10-CM | POA: Diagnosis not present

## 2020-05-28 DIAGNOSIS — G35 Multiple sclerosis: Secondary | ICD-10-CM | POA: Diagnosis not present

## 2020-05-28 DIAGNOSIS — D320 Benign neoplasm of cerebral meninges: Secondary | ICD-10-CM | POA: Diagnosis not present

## 2020-05-30 ENCOUNTER — Encounter: Payer: Self-pay | Admitting: Podiatry

## 2020-05-30 NOTE — Progress Notes (Signed)
  Subjective: [name] presents today for follow up of preventative diabetic foot care, callus(es) plantar aspect left foot and painful mycotic toenails b/l that are difficult to trim. Pain interferes with ambulation. Aggravating factors include wearing enclosed shoe gear. Pain is relieved with periodic professional debridement.   She states she did receive custom motorized chair modification of gel padding on her leg/foot pads. She is pleased with them. 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  She voices no other pedal concerns on today's visit.   No Known Allergies   Objective: There were no vitals filed for this visit.  Pt 59 y.o. year old [place] female, morbidly obese in NAD. AAO x 3.   Vascular Examination:  Capillary refill time to digits immediate b/l. Palpable pedal pulses b/l LE. Pedal hair absent. Lower extremity skin temperature gradient within normal limits. Trace edema noted left foot and right foot.  Dermatological Examination: Pedal skin with normal turgor, texture and tone bilaterally. No open wounds bilaterally. No interdigital macerations bilaterally. Toenails 1-5 b/l elongated, dystrophic, thickened, crumbly with subungual debris and tenderness to dorsal palpation. Hyperkeratotic lesion(s) submet head 5 left foot.  No erythema, no edema, no drainage, no flocculence.   Healed transverse abrasion dorsal aspect right foot.   Musculoskeletal: Muscle strength 3/5 to all LE muscle groups of right lower extremity. Flaccid lower extremities noted left LE. No pain crepitus or joint limitation noted with ROM b/l. No gross bony deformities bilaterally. Utilizes motorized chair for mobility assistance.  Neurological: Protective sensation diminished with 10g monofilament b/l. Vibratory sensation absent b/l  Assessment: 1. Onychomycosis   2. Callus   3. Multiple sclerosis ([place])    4. Diabetic polyneuropathy associated with diabetes mellitus due to underlying condition [facility])    Plan: -Continue diabetic foot care principles. Literature dispensed on today.  -Toenails 1-5 b/l were debrided in length and girth with sterile nail nippers and dremel without iatrogenic bleeding.  -Calluses submet head 5 left foot were debrided without complication or incident. Total number debrided =1. -She has received gel padding modifications to her motorized chair to prevent plantar pressure to feet. -She has received rubber padding to kitchen cabinets to prevent trauma to her feet. -Patient to continue soft, supportive shoe gear daily. -Patient to report any pedal injuries to medical professional immediately. -Patient/POA to call should there be question/concern in the interim.  Return in about 3 months (around 08/21/2020) for diabetic nail and callus trim.

## 2020-06-06 ENCOUNTER — Encounter (HOSPITAL_BASED_OUTPATIENT_CLINIC_OR_DEPARTMENT_OTHER): Payer: Medicare Other | Attending: Internal Medicine | Admitting: Internal Medicine

## 2020-06-06 NOTE — Progress Notes (Signed)
[name], [name] ([id number]) Visit Report for 06/06/2020 Arrival Information Details Patient Name: Date of Service: [name] [name] 06/06/2020 1:15 PM Medical Record Number: [id number] Patient Account Number: [id number] Date of Birth/Sex: Treating RN: August 21, 1961 (59 y.o. [name] Primary Care [name]: [name], ZO Brooks Other Clinician: Referring [name]: Treating [name] Weeks in Treatment: 93 Visit Information History Since Last Visit All ordered tests and consults were completed: No Patient Arrived: Wheel Chair Added or deleted any medications: No Arrival Time: 13:30 Any new allergies or adverse reactions: No Accompanied By: self Had a fall or experienced change in No Transfer Assistance: None activities of daily living that may affect Patient Identification Verified: Yes risk of falls: Secondary Verification Process Completed: Yes Signs or symptoms of abuse/neglect since last visito No Patient Requires Transmission-Based Precautions: No Hospitalized since last visit: No Patient Has Alerts: No Implantable device outside of the clinic excluding No cellular tissue based products placed in the center since last visit: Has Dressing in Place as Prescribed: Yes Pain Present Now: No Electronic Signature(s) Signed: 06/06/2020 3:42:07 PM By: Alexandria Coria RN Entered By: [name] on 06/06/2020 13:30:56 -------------------------------------------------------------------------------- Clinic Level of Care Assessment Details Patient Name: Date of Service: [name] [facility] 06/06/2020 1:15 PM Medical Record Number: [id number] Patient Account Number: [id number] Date of Birth/Sex: Treating RN: 1961-06-14 (59 y.o. [name] Primary Care Martice Doty: [name], ZO Brooks Other Clinician: Referring [name]: Treating [name]/Extender: [name], ZO Brooks Weeks in Treatment: [facility] Level of Care Assessment Items TOOL 4 Quantity  Score X- 1 0 Use when only an EandM is performed on FOLLOW-UP visit ASSESSMENTS - Nursing Assessment / Reassessment X- 1 10 Reassessment of Co-morbidities (includes updates in patient status) X- 1 5 Reassessment of Adherence to Treatment Plan ASSESSMENTS - Wound and Skin A ssessment / Reassessment '[]'$  - 0 Simple Wound Assessment / Reassessment - one wound '[]'$  - 0 Complex Wound Assessment / Reassessment - multiple wounds '[]'$  - 0 Dermatologic / Skin Assessment (not related to wound area) ASSESSMENTS - Focused Assessment '[]'$  - 0 Circumferential Edema Measurements - multi extremities '[]'$  - 0 Nutritional Assessment / Counseling / Intervention '[]'$  - 0 Lower Extremity Assessment (monofilament, tuning fork, pulses) '[]'$  - 0 Peripheral Arterial Disease Assessment (using hand held doppler) ASSESSMENTS - Ostomy and/or Continence Assessment and Care '[]'$  - 0 Incontinence Assessment and Management '[]'$  - 0 Ostomy Care Assessment and Management (repouching, etc.) PROCESS - Coordination of Care X - Simple Patient / Family Education for ongoing care 1 15 '[]'$  - 0 Complex (extensive) Patient / Family Education for ongoing care X- 1 10 Staff obtains Programmer, systems, Records, T Results / Process Orders est '[]'$  - 0 Staff telephones HHA, Nursing Homes / Clarify orders / etc '[]'$  - 0 Routine Transfer to another Facility (non-emergent condition) '[]'$  - 0 Routine Hospital Admission (non-emergent condition) '[]'$  - 0 New Admissions / Biomedical engineer / Ordering NPWT Apligraf, etc. , '[]'$  - 0 Emergency Hospital Admission (emergent condition) X- 1 10 Simple Discharge Coordination '[]'$  - 0 Complex (extensive) Discharge Coordination PROCESS - Special Needs '[]'$  - 0 Pediatric / Minor Patient Management '[]'$  - 0 Isolation Patient Management '[]'$  - 0 Hearing / Language / Visual special needs '[]'$  - 0 Assessment of Community assistance (transportation, D/C planning, etc.) '[]'$  - 0 Additional assistance / Altered  mentation '[]'$  - 0 Support Surface(s) Assessment (bed, cushion, seat, etc.) INTERVENTIONS - Wound Cleansing / Measurement X - Simple  Wound Cleansing - one wound 1 5 '[]'$  - 0 Complex Wound Cleansing - multiple wounds X- 1 5 Wound Imaging (photographs - any number of wounds) '[]'$  - 0 Wound Tracing (instead of photographs) X- 1 5 Simple Wound Measurement - one wound '[]'$  - 0 Complex Wound Measurement - multiple wounds INTERVENTIONS - Wound Dressings X - Small Wound Dressing one or multiple wounds 1 10 '[]'$  - 0 Medium Wound Dressing one or multiple wounds '[]'$  - 0 Large Wound Dressing one or multiple wounds X- 1 5 Application of Medications - topical '[]'$  - 0 Application of Medications - injection INTERVENTIONS - Miscellaneous '[]'$  - 0 External ear exam '[]'$  - 0 Specimen Collection (cultures, biopsies, blood, body fluids, etc.) '[]'$  - 0 Specimen(s) / Culture(s) sent or taken to Lab for analysis X- 1 10 Patient Transfer (multiple staff / Civil Service fast streamer / Similar devices) '[]'$  - 0 Simple Staple / Suture removal (25 or less) '[]'$  - 0 Complex Staple / Suture removal (26 or more) '[]'$  - 0 Hypo / Hyperglycemic Management (close monitor of Blood Glucose) '[]'$  - 0 Ankle / Brachial Index (ABI) - do not check if billed separately X- 1 5 Vital Signs Has the patient been seen at the hospital within the last three years: Yes Total Score: 95 Level Of Care: New/Established - Level 3 Electronic Signature(s) Signed: 06/06/2020 4:13:30 PM By: [name] Entered By: [name] on 06/06/2020 13:54:05 -------------------------------------------------------------------------------- Multi Wound Chart Details Patient Name: Date of Service: [name], [name] 06/06/2020 1:15 PM Medical Record Number: [id number] Patient Account Number: [id number] Date of Birth/Sex: Treating RN: [birth year] (59 y.o. [name] Primary Care Miyoko Hashimi: [name], ZO Brooks Other Clinician: Referring [name]: Treating  Atzin Buchta/Extender: [name], ZO Brooks Weeks in Treatment: 173 Vital Signs Height(in): 67 Capillary Blood Glucose(mg/dl): 140 Weight(lbs): 220 Pulse(bpm): 79 Body Mass Index(BMI): 34 Blood Pressure(mmHg): 130/72 Temperature(F): 98 Respiratory Rate(breaths/min): 18 Photos: [1:No Photos Right Ischium] [N/A:N/A N/A] Wound Location: [1:Pressure Injury] [N/A:N/A] Wounding Event: [1:Pressure Ulcer] [N/A:N/A] Primary Etiology: [1:Hypertension, Type II Diabetes,] [N/A:N/A] Comorbid History: [1:Osteoarthritis, Neuropathy 12/06/2005] [N/A:N/A] Date Acquired: [1:173] [N/A:N/A] Weeks of Treatment: [1:Open] [N/A:N/A] Wound Status: [1:Yes] [N/A:N/A] Clustered Wound: [1:2] [N/A:N/A] Clustered Quantity: [1:3x4.5x0.2] [N/A:N/A] Measurements L x W x D (cm) [1:10.603] [N/A:N/A] A (cm) : rea [1:2.121] [N/A:N/A] Volume (cm) : [1:43.80%] [N/A:N/A] % Reduction in A rea: [1:62.50%] [N/A:N/A] % Reduction in Volume: [1:Category/Stage III] [N/A:N/A] Classification: [1:Medium] [N/A:N/A] Exudate A mount: [1:Serosanguineous] [N/A:N/A] Exudate Type: [1:red, brown] [N/A:N/A] Exudate Color: [1:Flat and Intact] [N/A:N/A] Wound Margin: [1:Large (67-100%)] [N/A:N/A] Granulation A mount: [1:Red, Pink] [N/A:N/A] Granulation Quality: [1:Small (1-33%)] [N/A:N/A] Necrotic A mount: [1:Fat Layer (Subcutaneous Tissue)] [N/A:N/A] Exposed Structures: [1:Exposed: Yes Fascia: No Tendon: No Muscle: No Joint: No Bone: No Small (1-33%)] [N/A:N/A] Treatment Notes Electronic Signature(s) Signed: 06/06/2020 4:13:30 PM By: [name] Signed: 06/06/2020 5:03:02 PM By: Linton Ham MD Entered By: Linton Ham on 06/06/2020 13:49:59 -------------------------------------------------------------------------------- Multi-Disciplinary Care Plan Details Patient Name: Date of Service: [name], [place] [name] 06/06/2020 1:15 PM Medical Record Number: [id number] Patient Account Number: [id number] Date of  Birth/Sex: Treating RN: [birth year] (59 y.o. [name] Primary Care Melvina Pangelinan: [name], ZO Brooks Other Clinician: Referring Earnie Bechard: Treating Karsyn Rochin/Extender: [name], ZO Brooks Weeks in Treatment: 173 Active Inactive Pressure Nursing Diagnoses: Knowledge deficit related to causes and risk factors for pressure ulcer development Goals: Patient will remain free from development of additional pressure ulcers Date Initiated: 10/27/2018 Date Inactivated: 12/05/2019 Target Resolution Date: 11/23/2019 Goal  Status: Met Patient/caregiver will verbalize risk factors for pressure ulcer development Date Initiated: 02/11/2017 Date Inactivated: 10/27/2018 Target Resolution Date: 11/29/2018 Goal Status: Met Patient/caregiver will verbalize understanding of pressure ulcer management Date Initiated: 12/05/2019 Target Resolution Date: 06/13/2020 Goal Status: Active Interventions: Assess: immobility, friction, shearing, incontinence upon admission and as needed Notes: Wound/Skin Impairment Nursing Diagnoses: Impaired tissue integrity Knowledge deficit related to ulceration/compromised skin integrity Goals: Patient/caregiver will verbalize understanding of skin care regimen Date Initiated: 11/11/2017 Target Resolution Date: 06/13/2020 Goal Status: Active Interventions: Assess patient/caregiver ability to obtain necessary supplies Assess patient/caregiver ability to perform ulcer/skin care regimen upon admission and as needed Assess ulceration(s) every visit Treatment Activities: Patient referred to home care : 11/11/2017 Skin care regimen initiated : 11/11/2017 Topical wound management initiated : 11/11/2017 Notes: Electronic Signature(s) Signed: 06/06/2020 4:13:30 PM By: Cherylin Mylar Entered By: Cherylin Mylar on 06/06/2020 13:34:39 -------------------------------------------------------------------------------- Pain Assessment Details Patient Name: Date of  Service: [name] 06/06/2020 1:15 PM Medical Record Number: [id number] Patient Account Number: [id number] Date of Birth/Sex: Treating RN: [birth year] (59 y.o. Freddy Finner Primary Care Wing Gfeller: Shelda Jakes, ZO Brooks Other Clinician: Referring Charnette Younkin: Treating Jonahtan Manseau/Extender: Erick Colace, ZO Brooks Weeks in Treatment: 173 Active Problems Location of Pain Severity and Description of Pain Patient Has Paino No Site Locations Pain Management and Medication Current Pain Management: Electronic Signature(s) Signed: 06/06/2020 3:42:07 PM By: Yevonne Pax RN Entered By: Yevonne Pax on 06/06/2020 13:31:49 -------------------------------------------------------------------------------- Patient/Caregiver Education Details Patient Name: Date of Service: [name] 7/2/2021andnbsp1:15 PM Medical Record Number: [id number] Patient Account Number: [id number] Date of Birth/Gender: Treating RN: 1961/06/29 (59 y.o. Harvest Dark Primary Care Physician: Shelda Jakes, ZO Brooks Other Clinician: Referring Physician: Treating Physician/Extender: Lonzo Cloud Weeks in Treatment: 173 Education Assessment Education Provided To: Patient Education Topics Provided Wound/Skin Impairment: Handouts: Caring for Your Ulcer Methods: Explain/Verbal Responses: State content correctly Electronic Signature(s) Signed: 06/06/2020 4:13:30 PM By: Cherylin Mylar Entered By: Cherylin Mylar on 06/06/2020 13:36:02 -------------------------------------------------------------------------------- Wound Assessment Details Patient Name: Date of Service: [name] 06/06/2020 1:15 PM Medical Record Number: [id number] Patient Account Number: [id number] Date of Birth/Sex: Treating RN: [birth year] (59 y.o. Freddy Finner Primary Care Slaton Reaser: Shelda Jakes, ZO Brooks Other Clinician: Referring Tristen Pennino: Treating Cyrah Mclamb/Extender: Erick Colace, ZO Brooks Weeks in  Treatment: 173 Wound Status Wound Number: 1 Primary Etiology: Pressure Ulcer Wound Location: Right Ischium Wound Status: Open Wounding Event: Pressure Injury Comorbid History: Hypertension, Type II Diabetes, Osteoarthritis, Neuropathy Date Acquired: 12/06/2005 Weeks Of Treatment: 173 Clustered Wound: Yes Wound Measurements Length: (cm) 3 Width: (cm) 4.5 Depth: (cm) 0.2 Clustered Quantity: 2 Area: (cm) 10.603 Volume: (cm) 2.121 % Reduction in Area: 43.8% % Reduction in Volume: 62.5% Epithelialization: Small (1-33%) Tunneling: No Undermining: No Wound Description Classification: Category/Stage III Wound Margin: Flat and Intact Exudate Amount: Medium Exudate Type: Serosanguineous Exudate Color: red, brown Foul Odor After Cleansing: No Slough/Fibrino Yes Wound Bed Granulation Amount: Large (67-100%) Exposed Structure Granulation Quality: Red, Pink Fascia Exposed: No Necrotic Amount: Small (1-33%) Fat Layer (Subcutaneous Tissue) Exposed: Yes Necrotic Quality: Adherent Slough Tendon Exposed: No Muscle Exposed: No Joint Exposed: No Bone Exposed: No Electronic Signature(s) Signed: 06/06/2020 3:42:07 PM By: Yevonne Pax RN Entered By: Yevonne Pax on 06/06/2020 13:37:32 -------------------------------------------------------------------------------- Vitals Details Patient Name: Date of Service: [name], [name] 06/06/2020 1:15 PM Medical Record Number: [id number] Patient Account Number: [id number] Date of Birth/Sex: Treating RN: 1961/04/26 (59 y.o. F) Epps,  Morey Hummingbird Primary Care Axil Copeman: [name], ZO Brooks Other Clinician: Referring Greogry Goodwyn: Treating Ludivina Guymon/Extender: [name], ZO Brooks Weeks in Treatment: 173 Vital Signs Time Taken: 13:31 Temperature (F): 98 Height (in): 67 Pulse (bpm): 79 Weight (lbs): 220 Respiratory Rate (breaths/min): 18 Body Mass Index (BMI): 34.5 Blood Pressure (mmHg): 130/72 Capillary Blood Glucose (mg/dl): 140 Reference  Range: 80 - 120 mg / dl Notes CBG per patient Electronic Signature(s) Signed: 06/06/2020 3:42:07 PM By: Alexandria Coria RN Entered By: [name] on 06/06/2020 13:31:34
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
## 2020-06-16 ENCOUNTER — Encounter: Payer: Self-pay | Admitting: Family Medicine

## 2020-06-23 ENCOUNTER — Other Ambulatory Visit: Payer: Self-pay

## 2020-06-23 ENCOUNTER — Ambulatory Visit
Admission: RE | Admit: 2020-06-23 | Discharge: 2020-06-23 | Disposition: A | Discharge status: Home / Self Care | Payer: Medicare Other | Source: Ambulatory Visit | Attending: Radiation Oncology | Admitting: Radiation Oncology

## 2020-06-23 ENCOUNTER — Encounter: Payer: Self-pay | Admitting: Radiation Oncology

## 2020-06-23 DIAGNOSIS — Z79899 Other long term (current) drug therapy: Secondary | ICD-10-CM | POA: Diagnosis not present

## 2020-06-23 DIAGNOSIS — Z7984 Long term (current) use of oral hypoglycemic drugs: Secondary | ICD-10-CM | POA: Insufficient documentation

## 2020-06-23 DIAGNOSIS — M4802 Spinal stenosis, cervical region: Secondary | ICD-10-CM | POA: Diagnosis not present

## 2020-06-23 DIAGNOSIS — Z8542 Personal history of malignant neoplasm of other parts of uterus: Secondary | ICD-10-CM | POA: Diagnosis not present

## 2020-06-23 DIAGNOSIS — C541 Malignant neoplasm of endometrium: Secondary | ICD-10-CM

## 2020-06-23 DIAGNOSIS — Z923 Personal history of irradiation: Secondary | ICD-10-CM | POA: Insufficient documentation

## 2020-06-23 DIAGNOSIS — Z08 Encounter for follow-up examination after completed treatment for malignant neoplasm: Secondary | ICD-10-CM | POA: Diagnosis not present

## 2020-06-23 NOTE — Progress Notes (Signed)
Radiation Oncology         [phone] ________________________________  Name: [name] MRN: [id number]  Date: 06/23/2020  DOB: [birth year]  Follow-Up Visit Note  CC: Forrest Moron, MD  Forrest Moron, MD    ICD-10-CM   1. Endometrial cancer (HCC)  C54.1     Diagnosis:  Endometrial cancer grade 2, stage IB, pT1b, pN0(i+)  Interval Since Last Radiation: One year, two months, one week, and six days.  1. 02/14/2019 - 03/20/2019 ([facility]): Pelvis / 45 Gy in 25 fractions 2. 03/27/2019, 04/04/2019, 04/11/2019 (HDR, Brachy): Vaginal Cuff (cylinder, 3 cm diameter) / 18 Gy in 3 fractions (3 cm tx length)  Narrative:  The patient returns today for routine follow-up. Since her last visit, she underwent a cytoscopy, SP tube catheter exchange, bilateral retrograde pyelogram, Botox injections, and bladder biopsy fulguration on 01/22/2020 secondary to incontinence, neurogenic bladder, bladder erythema, and reflux uropathy. Procedure was performed by Dr. Junious Silk. Pathology from the procedure revealed polypoid cystitis and acute and chronic inflammation with stromal hemorrhage and telangiectasia of the posterior bladder. Biopsy was negative for malignancy.  Of note, the patient underwent a bilateral screening mammogram on 03/18/2020 that did not show any mammographic evidence of malignancy.  The patient was last seen by Dr. Denman George on 03/28/2020, during which time she had no symptoms that were concerning for recurrence.  MRI of brain on 05/01/2020 showed left parietal convexity meningioma at the vertex, mass effect on the underlying left frontoparietal lobes without parenchyma edema, and mild burden of supratentorial and infratentorial white matter lesions compatible with history of multiple sclerosis. MRI of cervical spine on that same day was limited due to body habitus and poor signal to noise ration. However, it did show a small central disc protrusion at C3-4, a broad-based disc protrusion at  C4-5 with flattening of the ventral thecal sac and near obliteration of the ventral CSF space, disc osteophyte complexes bilaterally with right greater than left foraminal stenosis, diffuse bulging annulus, osteophytic ridging and uncinate spurring at C5-6 with flattening of the ventral thecal sac and narrowing of the CSF space, mild foraminal enroachment bilaterally, and diffuse bulging annulus and shallow cental disc protrusion at C6-7. No obvious cord lesions were identified. Given the above findings, Dr. Felecia Shelling, neurologist, referred the patient to neurosurgery for consultation and possible surgery regarding the meningioma.  On review of systems, she reports no complaints. She denies vaginal bleeding/discharge and change in bowel or bladder patterns.  Patient has been seen at the wound center in her decubitus ulcers have improved significantly.  ALLERGIES:  has No Known Allergies.  Meds: Current Outpatient Medications  Medication Sig Dispense Refill  . atorvastatin (LIPITOR) 10 MG tablet Take 10 mg by mouth at bedtime.     . baclofen (LIORESAL) 20 MG tablet Take one po tid plus two po at bedtime (Patient taking differently: Take 20-40 mg by mouth See admin instructions. Take 1 tablet (20 mg) by mouth 3 times daily & take 2 tablet (40 mg) by mouth at bedtime.) 150 tablet 11  . Catheters (DOVER UNIVERSAL FOLEY TRAY) KIT Use foley tray kit to change catheter every 3 weeks or as needed 15 each 1  . Catheters (FOLEY CATHETER 2-WAY) MISC Use to aspirate urine suprapubic 15 each 1  . Cholecalciferol (D3-1000) 1000 units capsule Take 1,000 Units by mouth 2 (two) times daily. MORNING & AFTERNOON.    . furosemide (LASIX) 20 MG tablet TAKE 1 TABLET BY MOUTH ONCE DAILY (Patient taking differently:  Take 20 mg by mouth daily. ) 90 tablet 3  . glipiZIDE (GLUCOTROL) 5 MG tablet Take 1 tablet (5 mg total) by mouth daily before lunch. (Patient taking differently: Take 5 mg by mouth daily. ) 90 tablet 3  .  hydrochlorothiazide (HYDRODIURIL) 25 MG tablet TAKE 1 TABLET BY MOUTH ONCE DAILY EVERY MORNING 90 tablet 0  . hydrocortisone (ANUSOL-HC) 25 MG suppository Place 1 suppository (25 mg total) rectally 2 (two) times daily. (Patient taking differently: Place 25 mg rectally 2 (two) times daily as needed. ) 12 suppository 0  . hydrocortisone 2.5 % cream Place rectally.    . Incontinence Supply Disposable (TENA FLEX 16 PLUS) MISC Use daily as needed 30 each 11  . Incontinence Supply Disposable (TENA PROTECT UNDERWEAR PLS/XLG) MISC Use for incontinence. 30 each 11  . Interferon Beta-1b (BETASERON) 0.3 MG KIT injection Inject 0.3 mg into the skin every other day. (Patient taking differently: Inject 0.3 mg into the skin every other day. In the evening.) 36 each 3  . metFORMIN (GLUCOPHAGE) 500 MG tablet Take 500 mg by mouth 2 (two) times daily.  3  . MOVANTIK 25 MG TABS tablet Take 3 tablets (75 mg total) by mouth daily. (Patient taking differently: Take 75 mg by mouth every other day. In the morning) 90 tablet 4  . NON FORMULARY Power wheelchair repairs  Diagnosis code z99.3 1 each 0  . NON FORMULARY Dispense catheter urine drainage bags 90 Bag 3  . nystatin ointment (MYCOSTATIN) Apply to affected area twice daily as needed for irritation (Patient taking differently: Apply 1 application topically 2 (two) times daily as needed (irritation.). ) 30 g 6  . Ostomy Supplies (NEW IMAGE SKIN/FLANGE/TAPE) MISC 1 application by Does not apply route daily as needed. 30 each 11  . oxyCODONE-acetaminophen (PERCOCET) 10-325 MG tablet Take 1 tablet by mouth every 8 (eight) hours as needed for pain. 90 tablet 0  . potassium chloride (KLOR-CON) 10 MEQ tablet     . potassium chloride (MICRO-K) 10 MEQ CR capsule Take 30 mEq by mouth 2 (two) times daily.     . Syringe, Disposable, (B-D SYRINGE LUER-LOK 30CC) 30 ML MISC Use to change catheter 15 each 0  . vitamin B-12 (CYANOCOBALAMIN) 500 MCG tablet Take 500 mcg by mouth daily.      No current facility-administered medications for this encounter.   Facility-Administered Medications Ordered in Other Encounters  Medication Dose Route Frequency Provider Last Rate Last Admin  . botulinum toxin Type A (BOTOX) injection 200 Units  200 Units Intramuscular Once Festus Aloe, MD        Physical Findings: The patient is in no acute distress. Patient is alert and oriented.  temperature is 98 F (36.7 C). Her blood pressure is 125/77 and her pulse is 65. Her respiration is 20 and oxygen saturation is 99%. .  No significant changes. Lungs are clear to auscultation bilaterally. Heart has regular rate and rhythm. No palpable cervical, supraclavicular, or axillary adenopathy. Abdomen soft, non-tender, normal bowel sounds.  On pelvic examination the external genitalia were unremarkable. A speculum exam could not be performed due to positioning issues of the pelvis. On bimanual examination no pelvic mass is appreciated.  Vaginal mucosa smooth to palpation.  Exam would permit index finger only.  No bleeding noted.  Lab Findings: Lab Results  Component Value Date   WBC 6.7 01/17/2020   HGB 10.2 (L) 01/17/2020   HCT 34.3 (L) 01/17/2020   MCV 85.8 01/17/2020   PLT  446 (H) 01/17/2020    Radiographic Findings: No results found.  Impression: Endometrial cancer grade 2, stage IB, pT1b, pN0(i+)  No evidence of recurrence on clinical exam today.  She is physically unable to use her vaginal dilator.  Plan: The patient will follow-up with Dr. Denman George in three months and with radiation oncology in six months.  Total time spent in this encounter was 25 minutes which included reviewing the patient's most recent surgery, pathology report, follow-ups, MRIs, mammogram, physical examination, and documentation.  _________________________________   Blair Promise, PhD, MD  This document serves as a record of services personally performed by Gery Pray, MD. It was created on his  behalf by Clerance Lav, a trained medical scribe. The creation of this record is based on the scribe's personal observations and the provider's statements to them. This document has been checked and approved by the attending provider.

## 2020-06-23 NOTE — Progress Notes (Signed)
Patient here for a 6 month f/u visit with Dr. Sondra Come. She denies any bleeding, discharge or problems with her bladder or bowels. Patient has a foley catheter draining clear yellow urine into her drainage bag. RN with the assistance of 2 NT's used a hoyer lift to move patient onto the exam table from her motorized wheel chair and back. Patient denies other concerns at present. BP 125/77 (BP Location: Right Wrist, Patient Position: Sitting, Cuff Size: Normal)   Pulse 65   Temp 98 F (36.7 C)   Resp 20   SpO2 99%   Wt Readings from Last 3 Encounters:  03/28/20 294 lb (133.4 kg)  02/26/20 300 lb (136.1 kg)  02/25/20 300 lb (136.1 kg)

## 2020-07-04 DIAGNOSIS — N318 Other neuromuscular dysfunction of bladder: Secondary | ICD-10-CM | POA: Diagnosis not present

## 2020-07-09 ENCOUNTER — Ambulatory Visit: Payer: Medicare Other | Admitting: Family Medicine

## 2020-07-11 ENCOUNTER — Encounter (HOSPITAL_BASED_OUTPATIENT_CLINIC_OR_DEPARTMENT_OTHER): Payer: Medicare Other | Attending: Internal Medicine | Admitting: Physician Assistant

## 2020-07-11 DIAGNOSIS — E669 Obesity, unspecified: Secondary | ICD-10-CM | POA: Insufficient documentation

## 2020-07-11 DIAGNOSIS — E11622 Type 2 diabetes mellitus with other skin ulcer: Secondary | ICD-10-CM | POA: Insufficient documentation

## 2020-07-11 DIAGNOSIS — Z993 Dependence on wheelchair: Secondary | ICD-10-CM | POA: Insufficient documentation

## 2020-07-11 DIAGNOSIS — Z6841 Body Mass Index (BMI) 40.0 and over, adult: Secondary | ICD-10-CM | POA: Insufficient documentation

## 2020-07-11 DIAGNOSIS — L89313 Pressure ulcer of right buttock, stage 3: Secondary | ICD-10-CM | POA: Diagnosis not present

## 2020-07-11 DIAGNOSIS — G629 Polyneuropathy, unspecified: Secondary | ICD-10-CM | POA: Insufficient documentation

## 2020-07-11 DIAGNOSIS — G35 Multiple sclerosis: Secondary | ICD-10-CM | POA: Insufficient documentation
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
## 2020-07-15 NOTE — Progress Notes (Signed)
Alexandria, Brooks ([id number]) Visit Report for 07/11/2020 Arrival Information Details Patient Name: Date of Service: [name] [facility] 07/11/2020 2:15 PM Medical Record Number: [id number] Patient Account Number: [id number] Date of Birth/Sex: Treating RN: 1961-11-19 (58 y.o. [name] Primary Care Mattias Walmsley: Olivia Canter, ZO E Other Clinician: Referring Traevion Poehler: Treating Marieta Markov/Extender: Aniceto Boss, ZO E Weeks in Treatment: 65 Visit Information History Since Last Visit Added or deleted any medications: No Patient Arrived: Wheel Chair Any new allergies or adverse reactions: No Arrival Time: 15:04 Had a fall or experienced change in No Accompanied By: self activities of daily living that may affect Transfer Assistance: None risk of falls: Patient Identification Verified: Yes Signs or symptoms of abuse/neglect since last visito No Secondary Verification Process Completed: Yes Hospitalized since last visit: No Patient Requires Transmission-Based Precautions: No Implantable device outside of the clinic excluding No Patient Has Alerts: No cellular tissue based products placed in the center since last visit: Has Dressing in Place as Prescribed: Yes Pain Present Now: Yes Electronic Signature(s) Signed: 07/15/2020 10:17:44 AM By: Sandre Kitty Entered By: Sandre Kitty on 07/11/2020 15:04:27 -------------------------------------------------------------------------------- Clinic Level of Care Assessment Details Patient Name: Date of Service: [name] [facility] 07/11/2020 2:15 PM Medical Record Number: [id number] Patient Account Number: [id number] Date of Birth/Sex: Treating RN: 09-20-1961 (59 y.o. [name] Primary Care Pasquale Matters: Olivia Canter, ZO E Other Clinician: Referring Georgia Baria: Treating Aarron Wierzbicki/Extender: Aniceto Boss, ZO E Weeks in Treatment: [facility] Level of Care Assessment Items TOOL 4 Quantity Score X- 1 0 Use when only an EandM  is performed on FOLLOW-UP visit ASSESSMENTS - Nursing Assessment / Reassessment X- 1 10 Reassessment of Co-morbidities (includes updates in patient status) X- 1 5 Reassessment of Adherence to Treatment Plan ASSESSMENTS - Wound and Skin A ssessment / Reassessment X - Simple Wound Assessment / Reassessment - one wound 1 5 _0  - 0 Complex Wound Assessment / Reassessment - multiple wounds _1  - 0 Dermatologic / Skin Assessment (not related to wound area) ASSESSMENTS - Focused Assessment _2  - 0 Circumferential Edema Measurements - multi extremities _3  - 0 Nutritional Assessment / Counseling / Intervention _4  - 0 Lower Extremity Assessment (monofilament, tuning fork, pulses) _5  - 0 Peripheral Arterial Disease Assessment (using hand held doppler) ASSESSMENTS - Ostomy and/or Continence Assessment and Care _6  - 0 Incontinence Assessment and Management _7  - 0 Ostomy Care Assessment and Management (repouching, etc.) PROCESS - Coordination of Care X - Simple Patient / Family Education for ongoing care 1 15 _8  - 0 Complex (extensive) Patient / Family Education for ongoing care X- 1 10 Staff obtains Programmer, systems, Records, T Results / Process Orders est _9  - 0 Staff telephones HHA, Nursing Homes / Clarify orders / etc _10  - 0 Routine Transfer to another Facility (non-emergent condition) _11  - 0 Routine Hospital Admission (non-emergent condition) _12  - 0 New Admissions / Biomedical engineer / Ordering NPWT Apligraf, etc. , _13  - 0 Emergency Hospital Admission (emergent condition) X- 1 10 Simple Discharge Coordination _14  - 0 Complex (extensive) Discharge Coordination PROCESS - Special Needs _15  - 0 Pediatric / Minor Patient Management _16  - 0 Isolation Patient Management _17  - 0 Hearing / Language / Visual special needs _18  - 0 Assessment of Community assistance (transportation, D/C planning, etc.) _19  - 0 Additional assistance / Altered mentation _20  - 0 Support Surface(s) Assessment  (bed, cushion, seat, etc.) INTERVENTIONS - Wound Cleansing / Measurement X - Simple Wound Cleansing - one wound 1  5 _0  - 0 Complex Wound Cleansing - multiple wounds X- 1 5 Wound Imaging (photographs - any number of wounds) _1  - 0 Wound Tracing (instead of photographs) X- 1 5 Simple Wound Measurement - one wound _2  - 0 Complex Wound Measurement - multiple wounds INTERVENTIONS - Wound Dressings X - Small Wound Dressing one or multiple wounds 1 10 _3  - 0 Medium Wound Dressing one or multiple wounds _4  - 0 Large Wound Dressing one or multiple wounds X- 1 5 Application of Medications - topical <[id number]>_1<\/[id number]>_4  - 0 Application of Medications - injection INTERVENTIONS - Miscellaneous _6  - 0 External ear exam _7  - 0 Specimen Collection (cultures, biopsies, blood, body fluids, etc.) _8  - 0 Specimen(s) / Culture(s) sent or taken to Lab for analysis _9  - 0 Patient Transfer (multiple staff / Civil Service fast streamer / Similar devices) _10  - 0 Simple Staple / Suture removal (25 or less) _11  - 0 Complex Staple / Suture removal (26 or more) _12  - 0 Hypo / Hyperglycemic Management (close monitor of Blood Glucose) _13  - 0 Ankle / Brachial Index (ABI) - do not check if billed separately X- 1 5 Vital Signs Has the patient been seen at the hospital within the last three years: Yes Total Score: 90 Level Of Care: New/Established - Level 3 Electronic Signature(s) Signed: 07/11/2020 4:58:13 PM By: Kela Millin Entered By: Kela Millin on 07/11/2020 15:24:51 -------------------------------------------------------------------------------- Encounter Discharge Information Details Patient Name: Date of Service: [name], [name] 07/11/2020 2:15 PM Medical Record Number: [id number] Patient Account Number: [id number] Date of Birth/Sex: Treating RN: 23-Dec-1960 (59 y.o. [name] Primary Care Rebie Peale: Olivia Canter, ZO E Other Clinician: Referring Lauraann Missey: Treating Emmalee Solivan/Extender: Aniceto Boss,  ZO E Weeks in Treatment: [phone] Encounter Discharge Information Items Discharge Condition: Stable Ambulatory Status: Wheelchair Discharge Destination: Home Transportation: Other Accompanied By: self Schedule Follow-up Appointment: Yes Clinical Summary of Care: Patient Declined Electronic Signature(s) Signed: 07/11/2020 4:58:13 PM By: Kela Millin Entered By: Kela Millin on 07/11/2020 15:54:45 -------------------------------------------------------------------------------- [place] Details Patient Name: Date of Service: [name], [name] 07/11/2020 2:15 PM Medical Record Number: [id number] Patient Account Number: [id number] Date of Birth/Sex: Treating RN: May 05, 1961 (59 y.o. [name] Primary Care Micheline Markes: Olivia Canter, ZO E Other Clinician: Referring Gaelan Glennon: Treating Jacere Pangborn/Extender: Aniceto Boss, ZO E Weeks in Treatment: 178 Active Inactive Pressure Nursing Diagnoses: Knowledge deficit related to causes and risk factors for pressure ulcer development Goals: Patient will remain free from development of additional pressure ulcers Date Initiated: 10/27/2018 Date Inactivated: 12/05/2019 Target Resolution Date: 11/23/2019 Goal Status: Met Patient/caregiver will verbalize risk factors for pressure ulcer development Date Initiated: 02/11/2017 Date Inactivated: 10/27/2018 Target Resolution Date: 11/29/2018 Goal Status: Met Patient/caregiver will verbalize understanding of pressure ulcer management Date Initiated: 12/05/2019 Target Resolution Date: 08/01/2020 Goal Status: Active Interventions: Assess: immobility, friction, shearing, incontinence upon admission and as needed Notes: Wound/Skin Impairment Nursing Diagnoses: Impaired tissue integrity Knowledge deficit related to ulceration/compromised skin integrity Goals: Patient/caregiver will verbalize understanding of skin care regimen Date Initiated: 11/11/2017 Target  Resolution Date: 08/01/2020 Goal Status: Active Interventions: Assess patient/caregiver ability to obtain necessary supplies Assess patient/caregiver ability to perform ulcer/skin care regimen upon admission and as needed Assess ulceration(s) every visit Treatment Activities: Patient referred to home care : 11/11/2017 Skin care regimen initiated : 11/11/2017 Topical wound management initiated : 11/11/2017 Notes: Electronic Signature(s) Signed: 07/11/2020 4:58:13 PM By: Kela Millin Entered By: Kela Millin on 07/11/2020 14:44:13 -------------------------------------------------------------------------------- Pain Assessment Details Patient  Name: Date of Service: [name] [facility] 07/11/2020 2:15 PM Medical Record Number: [id number] Patient Account Number: [id number] Date of Birth/Sex: Treating RN: 1961/05/26 (59 y.o. [name] Primary Care Lauranne Beyersdorf: Olivia Canter, ZO E Other Clinician: Referring Inigo Lantigua: Treating Nasiya Pascual/Extender: Aniceto Boss, ZO E Weeks in Treatment: 178 Active Problems Location of Pain Severity and Description of Pain Patient Has Paino No Site Locations Pain Management and Medication Current Pain Management: Electronic Signature(s) Signed: 07/11/2020 4:58:13 PM By: Kela Millin Signed: 07/15/2020 10:17:44 AM By: Sandre Kitty Entered By: Sandre Kitty on 07/11/2020 15:05:22 -------------------------------------------------------------------------------- Patient/Caregiver Education Details Patient Name: Date of Service: [name] [facility] 8/6/2021andnbsp2:15 PM Medical Record Number: [id number] Patient Account Number: [id number] Date of Birth/Gender: Treating RN: [birth year] (59 y.o. [name] Primary Care Physician: Olivia Canter, [place] Other Clinician: Referring Physician: Treating Physician/Extender: Aniceto Boss, ZO E Weeks in Treatment: 178 Education Assessment Education Provided  To: Patient Education Topics Provided Wound/Skin Impairment: Handouts: Caring for Your Ulcer Methods: Explain/Verbal Responses: State content correctly Electronic Signature(s) Signed: 07/11/2020 4:58:13 PM By: Kela Millin Entered By: Kela Millin on 07/11/2020 14:44:43 -------------------------------------------------------------------------------- Wound Assessment Details Patient Name: Date of Service: [name] [name] 07/11/2020 2:15 PM Medical Record Number: [id number] Patient Account Number: [id number] Date of Birth/Sex: Treating RN: 03-10-1961 (59 y.o. [name] Primary Care Yuchen Fedor: Olivia Canter, ZO E Other Clinician: Referring Naylah Cork: Treating Zeeshan Korte/Extender: Aniceto Boss, ZO E Weeks in Treatment: 178 Wound Status Wound Number: 1 Primary Etiology: Pressure Ulcer Wound Location: Right Ischium Wound Status: Open Wounding Event: Pressure Injury Comorbid Hypertension, Type II Diabetes, Osteoarthritis, History: Neuropathy Date Acquired: 12/06/2005 Weeks Of Treatment: 178 Clustered Wound: Yes Photos Photo Uploaded By: Mikeal Hawthorne on 07/14/2020 11:37:06 Wound Measurements Length: (cm) 2.2 Width: (cm) 2.3 Depth: (cm) 0.1 Clustered Quantity: 2 Area: (cm) 3.974 Volume: (cm) 0.397 % Reduction in Area: 78.9% % Reduction in Volume: 93% Epithelialization: Small (1-33%) Tunneling: No Undermining: No Wound Description Classification: Category/Stage III Wound Margin: Flat and Intact Exudate Amount: Medium Exudate Type: Serosanguineous Exudate Color: red, brown Foul Odor After Cleansing: No Slough/Fibrino Yes Wound Bed Granulation Amount: Large (67-100%) Exposed Structure Granulation Quality: Red, Pink Fascia Exposed: No Necrotic Amount: Small (1-33%) Fat Layer (Subcutaneous Tissue) Exposed: Yes Necrotic Quality: Adherent Slough Tendon Exposed: No Muscle Exposed: No Joint Exposed: No Bone Exposed: No Treatment Notes Wound #1  (Right Ischium) 1. Cleanse With Wound Cleanser 2. Periwound Care Skin Prep 3. Primary Dressing Applied Calcium Alginate Ag 4. Secondary Dressing Foam Border Dressing Electronic Signature(s) Signed: 07/11/2020 4:42:20 PM By: Carlene Coria RN Signed: 07/11/2020 4:58:13 PM By: Kela Millin Entered By: Carlene Coria on 07/11/2020 15:06:25 -------------------------------------------------------------------------------- Vitals Details Patient Name: Date of Service: [name], [name] 07/11/2020 2:15 PM Medical Record Number: [id number] Patient Account Number: [id number] Date of Birth/Sex: Treating RN: 08/12/1961 (59 y.o. [name] Primary Care Toure Edmonds: Olivia Canter, ZO E Other Clinician: Referring Julee Stoll: Treating Heavan Francom/Extender: Aniceto Boss, ZO E Weeks in Treatment: 178 Vital Signs Time Taken: 15:04 Temperature (F): 98.0 Height (in): 67 Pulse (bpm): 65 Weight (lbs): 220 Respiratory Rate (breaths/min): 18 Body Mass Index (BMI): 34.5 Blood Pressure (mmHg): 120/68 Capillary Blood Glucose (mg/dl): 140 Reference Range: 80 - 120 mg / dl Electronic Signature(s) Signed: 07/15/2020 10:17:44 AM By: Sandre Kitty Entered By: Sandre Kitty on 07/11/2020 15:05:06

## 2020-07-18 DIAGNOSIS — I10 Essential (primary) hypertension: Secondary | ICD-10-CM | POA: Diagnosis not present

## 2020-07-18 DIAGNOSIS — E782 Mixed hyperlipidemia: Secondary | ICD-10-CM | POA: Diagnosis not present

## 2020-07-18 DIAGNOSIS — E1142 Type 2 diabetes mellitus with diabetic polyneuropathy: Secondary | ICD-10-CM | POA: Diagnosis not present

## 2020-07-25 DIAGNOSIS — N318 Other neuromuscular dysfunction of bladder: Secondary | ICD-10-CM | POA: Diagnosis not present

## 2020-08-08 ENCOUNTER — Encounter (HOSPITAL_BASED_OUTPATIENT_CLINIC_OR_DEPARTMENT_OTHER): Payer: Medicare Other | Attending: Internal Medicine | Admitting: Internal Medicine

## 2020-08-08 DIAGNOSIS — E1151 Type 2 diabetes mellitus with diabetic peripheral angiopathy without gangrene: Secondary | ICD-10-CM | POA: Diagnosis not present

## 2020-08-08 DIAGNOSIS — Z993 Dependence on wheelchair: Secondary | ICD-10-CM | POA: Diagnosis not present

## 2020-08-08 DIAGNOSIS — L89219 Pressure ulcer of right hip, unspecified stage: Secondary | ICD-10-CM | POA: Diagnosis not present

## 2020-08-08 DIAGNOSIS — M1990 Unspecified osteoarthritis, unspecified site: Secondary | ICD-10-CM | POA: Diagnosis not present

## 2020-08-08 DIAGNOSIS — L89313 Pressure ulcer of right buttock, stage 3: Secondary | ICD-10-CM | POA: Diagnosis not present

## 2020-08-08 DIAGNOSIS — I10 Essential (primary) hypertension: Secondary | ICD-10-CM | POA: Insufficient documentation

## 2020-08-08 DIAGNOSIS — L89319 Pressure ulcer of right buttock, unspecified stage: Secondary | ICD-10-CM | POA: Diagnosis not present

## 2020-08-08 DIAGNOSIS — G35 Multiple sclerosis: Secondary | ICD-10-CM | POA: Diagnosis not present

## 2020-08-08 DIAGNOSIS — E1140 Type 2 diabetes mellitus with diabetic neuropathy, unspecified: Secondary | ICD-10-CM | POA: Diagnosis not present

## 2020-08-08 DIAGNOSIS — E11622 Type 2 diabetes mellitus with other skin ulcer: Secondary | ICD-10-CM | POA: Diagnosis not present
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
## 2020-08-12 NOTE — Progress Notes (Signed)
[name], [name] ([id number]) Visit Report for 08/08/2020 Arrival Information Details Patient Name: Date of Service: [name] [facility] 08/08/2020 1:15 PM Medical Record Number: [id number] Patient Account Number: [id number] Date of Birth/Sex: Treating RN: 06-May-1961 (59 y.o. [name] Primary Care Roxas Clymer: Olivia Canter, ZO E Other Clinician: Referring Nanako Stopher: Treating Jaceyon Strole/Extender: Gale Journey, ZO E Weeks in Treatment: 38 Visit Information History Since Last Visit Added or deleted any medications: No Patient Arrived: Wheel Chair Any new allergies or adverse reactions: No Arrival Time: 13:27 Had a fall or experienced change in No Accompanied By: self activities of daily living that may affect Transfer Assistance: [name] risk of falls: Patient Identification Verified: Yes Signs or symptoms of abuse/neglect since last visito No Secondary Verification Process Completed: Yes Hospitalized since last visit: No Patient Requires Transmission-Based Precautions: No Implantable device outside of the clinic excluding No Patient Has Alerts: No cellular tissue based products placed in the center since last visit: Has Dressing in Place as Prescribed: Yes Pain Present Now: Yes Electronic Signature(s) Signed: 08/12/2020 12:03:37 PM By: Sandre Kitty Entered By: Sandre Kitty on 08/08/2020 13:28:07 -------------------------------------------------------------------------------- Clinic Level of Care Assessment Details Patient Name: Date of Service: [name] [facility] 08/08/2020 1:15 PM Medical Record Number: [id number] Patient Account Number: [id number] Date of Birth/Sex: Treating RN: Dec 25, 1960 (59 y.o. [name] Primary Care Mavin Dyke: Olivia Canter, ZO E Other Clinician: Referring Heela Heishman: Treating Eldon Zietlow/Extender: Gale Journey, ZO E Weeks in Treatment: [facility] Level of Care Assessment Items TOOL 4 Quantity Score X- 1 0 Use when only an  EandM is performed on FOLLOW-UP visit ASSESSMENTS - Nursing Assessment / Reassessment X- 1 10 Reassessment of Co-morbidities (includes updates in patient status) X- 1 5 Reassessment of Adherence to Treatment Plan ASSESSMENTS - Wound and Skin A ssessment / Reassessment _0  - 0 Simple Wound Assessment / Reassessment - one wound X- 2 5 Complex Wound Assessment / Reassessment - multiple wounds _1  - 0 Dermatologic / Skin Assessment (not related to wound area) ASSESSMENTS - Focused Assessment _2  - 0 Circumferential Edema Measurements - multi extremities _3  - 0 Nutritional Assessment / Counseling / Intervention _4  - 0 Lower Extremity Assessment (monofilament, tuning fork, pulses) _5  - 0 Peripheral Arterial Disease Assessment (using hand held doppler) ASSESSMENTS - Ostomy and/or Continence Assessment and Care _6  - 0 Incontinence Assessment and Management _7  - 0 Ostomy Care Assessment and Management (repouching, etc.) PROCESS - Coordination of Care X - Simple Patient / Family Education for ongoing care 1 15 _8  - 0 Complex (extensive) Patient / Family Education for ongoing care X- 1 10 Staff obtains Programmer, systems, Records, T Results / Process Orders est _9  - 0 Staff telephones HHA, Nursing Homes / Clarify orders / etc _10  - 0 Routine Transfer to another Facility (non-emergent condition) _11  - 0 Routine Hospital Admission (non-emergent condition) _12  - 0 New Admissions / Biomedical engineer / Ordering NPWT Apligraf, etc. , _13  - 0 Emergency Hospital Admission (emergent condition) X- 1 10 Simple Discharge Coordination _14  - 0 Complex (extensive) Discharge Coordination PROCESS - Special Needs _15  - 0 Pediatric / Minor Patient Management _16  - 0 Isolation Patient Management _17  - 0 Hearing / Language / Visual special needs _18  - 0 Assessment of Community assistance (transportation, D/C planning, etc.) _19  - 0 Additional assistance / Altered mentation _20  - 0 Support Surface(s)  Assessment (bed, cushion, seat, etc.) INTERVENTIONS - Wound Cleansing / Measurement _21  - 0 Simple Wound Cleansing - one wound X- 2  5 Complex Wound Cleansing - multiple wounds X- 1 5 Wound Imaging (photographs - any number of wounds) _0  - 0 Wound Tracing (instead of photographs) _1  - 0 Simple Wound Measurement - one wound X- 2 5 Complex Wound Measurement - multiple wounds INTERVENTIONS - Wound Dressings X - Small Wound Dressing one or multiple wounds 2 10 _2  - 0 Medium Wound Dressing one or multiple wounds _3  - 0 Large Wound Dressing one or multiple wounds X- 1 5 Application of Medications - topical <[id number]>_8<\/NOMVEHMCNOBSJGGE>_3  - 0 Application of Medications - injection INTERVENTIONS - Miscellaneous _5  - 0 External ear exam _6  - 0 Specimen Collection (cultures, biopsies, blood, body fluids, etc.) _7  - 0 Specimen(s) / Culture(s) sent or taken to Lab for analysis X- 1 10 Patient Transfer (multiple staff / Civil Service fast streamer / Similar devices) _8  - 0 Simple Staple / Suture removal (25 or less) _9  - 0 Complex Staple / Suture removal (26 or more) _10  - 0 Hypo / Hyperglycemic Management (close monitor of Blood Glucose) _11  - 0 Ankle / Brachial Index (ABI) - do not check if billed separately X- 1 5 Vital Signs Has the patient been seen at the hospital within the last three years: Yes Total Score: 125 Level Of Care: New/Established - Level 4 Electronic Signature(s) Signed: 08/08/2020 3:13:35 PM By: Kela Millin Entered By: Kela Millin on 08/08/2020 14:07:42 -------------------------------------------------------------------------------- Encounter Discharge Information Details Patient Name: Date of Service: Abbey Chatters, [place] SA LYN 08/08/2020 1:15 PM Medical Record Number: [id number] Patient Account Number: [id number] Date of Birth/Sex: Treating RN: [birth year] (59 y.o. Elam Dutch Primary Care Foy Vanduyne: Olivia Canter, [place] Other Clinician: Referring Chinaza Rooke: Treating Zoanne Newill/Extender: Gale Journey, ZO E Weeks in Treatment: [phone] Encounter Discharge Information Items Discharge Condition: Stable Ambulatory Status: Wheelchair Discharge Destination: Home Transportation: Private Auto Accompanied By: family Schedule Follow-up Appointment: Yes Clinical Summary of Care: Patient Declined Electronic Signature(s) Signed: 08/08/2020 2:45:56 PM By: Baruch Gouty RN, BSN Entered By: Baruch Gouty on 08/08/2020 14:08:23 -------------------------------------------------------------------------------- Multi Wound Chart Details Patient Name: Date of Service: Jaquita Rector SA LYN 08/08/2020 1:15 PM Medical Record Number: [id number] Patient Account Number: [id number] Date of Birth/Sex: Treating RN: 05-15-1961 (59 y.o. [name] Primary Care Esbeidy Mclaine: Olivia Canter, ZO E Other Clinician: Referring Delayne Sanzo: Treating Vail Vuncannon/Extender: Gale Journey, ZO E Weeks in Treatment: 182 Vital Signs Height(in): 67 Capillary Blood Glucose(mg/dl): 145 Weight(lbs): 220 Pulse(bpm): 85 Body Mass Index(BMI): 34 Blood Pressure(mmHg): 143/64 Temperature(F): 98.7 Respiratory Rate(breaths/min): 18 Photos: [1:No Photos Right Ischium] [10:No Photos Right Gluteal fold] [N/A:N/A N/A] Wound Location: [1:Pressure Injury] [10:Pressure Injury] [N/A:N/A] Wounding Event: [1:Pressure Ulcer] [10:Pressure Ulcer] [N/A:N/A] Primary Etiology: [1:Hypertension, Type II Diabetes,] [10:Hypertension, Type II Diabetes,] [N/A:N/A] Comorbid History: [1:Osteoarthritis, Neuropathy 12/06/2005] [10:Osteoarthritis, Neuropathy 08/08/2020] [N/A:N/A] Date Acquired: [1:182] [10:0] [N/A:N/A] Weeks of Treatment: [1:Open] [10:Open] [N/A:N/A] Wound Status: [1:Yes] [10:No] [N/A:N/A] Clustered Wound: [1:2] [10:N/A] [N/A:N/A] Clustered Quantity: [1:0.7x0.6x0.1] [10:0.2x0.5x0.1] [N/A:N/A] Measurements L x W x D (cm) [1:0.33] [10:0.079] [N/A:N/A] A (cm) : rea [1:0.033] [10:0.008] [N/A:N/A] Volume (cm) : [1:98.20%]  [10:N/A] [N/A:N/A] % Reduction in A rea: [1:99.40%] [10:N/A] [N/A:N/A] % Reduction in Volume: [1:Category/Stage III] [10:Category/Stage III] [N/A:N/A] Classification: [1:Small] [10:Small] [N/A:N/A] Exudate A mount: [1:Serosanguineous] [10:Serous] [N/A:N/A] Exudate Type: [1:red, brown] [10:amber] [N/A:N/A] Exudate Color: [1:Flat and Intact] [10:Flat and Intact] [N/A:N/A] Wound Margin: [1:Large (67-100%)] [10:Large (67-100%)] [N/A:N/A] Granulation A mount: [1:Red] [10:Red] [N/A:N/A] Granulation Quality: [1:None Present (0%)] [10:None Present (0%)] [N/A:N/A] Necrotic A mount: [1:Fat Layer (Subcutaneous Tissue): Yes Fat Layer (Subcutaneous Tissue): Yes  N/A] Exposed Structures: [1:Fascia: No Tendon: No Muscle: No Joint: No Bone: No Small (1-33%)] [10:Fascia: No Tendon: No Muscle: No Joint: No Bone: No Small (1-33%)] [N/A:N/A] Treatment Notes Wound #1 (Right Ischium) 3. Primary Dressing Applied Calcium Alginate Ag 4. Secondary Dressing ABD Pad 5. Secured With Tape Wound #10 (Right Gluteal fold) 3. Primary Dressing Applied Calcium Alginate Ag 4. Secondary Dressing ABD Pad 5. Secured With Recruitment consultant) Signed: 08/12/2020 7:41:19 AM By: Linton Ham MD Signed: 08/12/2020 5:24:19 PM By: Kela Millin Entered By: Linton Ham on 08/09/2020 15:38:01 -------------------------------------------------------------------------------- Multi-Disciplinary Care Plan Details Patient Name: Date of Service: Abbey Chatters, [place] SA LYN 08/08/2020 1:15 PM Medical Record Number: [id number] Patient Account Number: [id number] Date of Birth/Sex: Treating RN: 07/27/1961 (60 y.o. [name] Primary Care Jenia Klepper: Olivia Canter, ZO E Other Clinician: Referring Jahzier Villalon: Treating Tenlee Wollin/Extender: Gale Journey, ZO E Weeks in Treatment: 182 Active Inactive Pressure Nursing Diagnoses: Knowledge deficit related to causes and risk factors for pressure ulcer  development Goals: Patient will remain free from development of additional pressure ulcers Date Initiated: 10/27/2018 Date Inactivated: 12/05/2019 Target Resolution Date: 11/23/2019 Goal Status: Met Patient/caregiver will verbalize risk factors for pressure ulcer development Date Initiated: 02/11/2017 Date Inactivated: 10/27/2018 Target Resolution Date: 11/29/2018 Goal Status: Met Patient/caregiver will verbalize understanding of pressure ulcer management Date Initiated: 12/05/2019 Target Resolution Date: 09/05/2020 Goal Status: Active Interventions: Assess: immobility, friction, shearing, incontinence upon admission and as needed Notes: Wound/Skin Impairment Nursing Diagnoses: Impaired tissue integrity Knowledge deficit related to ulceration/compromised skin integrity Goals: Patient/caregiver will verbalize understanding of skin care regimen Date Initiated: 11/11/2017 Target Resolution Date: 09/05/2020 Goal Status: Active Interventions: Assess patient/caregiver ability to obtain necessary supplies Assess patient/caregiver ability to perform ulcer/skin care regimen upon admission and as needed Assess ulceration(s) every visit Treatment Activities: Patient referred to home care : 11/11/2017 Skin care regimen initiated : 11/11/2017 Topical wound management initiated : 11/11/2017 Notes: Electronic Signature(s) Signed: 08/08/2020 3:13:35 PM By: Kela Millin Entered By: Kela Millin on 08/08/2020 14:06:21 -------------------------------------------------------------------------------- Pain Assessment Details Patient Name: Date of Service: Jaquita Rector SA LYN 08/08/2020 1:15 PM Medical Record Number: [id number] Patient Account Number: [id number] Date of Birth/Sex: Treating RN: [birth year] (58 y.o. [name] Primary Care Cybill Uriegas: Olivia Canter, ZO E Other Clinician: Referring Advit Trethewey: Treating Haroun Cotham/Extender: Gale Journey, ZO E Weeks in Treatment:  182 Active Problems Location of Pain Severity and Description of Pain Patient Has Paino Yes Site Locations Pain Location: Pain Location: Pain in Ulcers With Dressing Change: No Duration of the Pain. Constant / Intermittento Constant Rate the pain. Current Pain Level: 3 Least Pain Level: 1 Character of Pain Describe the Pain: Tender, Other: sore Pain Management and Medication Current Pain Management: Is the Current Pain Management Adequate: Adequate How does your wound impact your activities of daily livingo Sleep: No Bathing: No Appetite: No Relationship With Others: No Bladder Continence: No Emotions: No Bowel Continence: No Work: No Toileting: No Drive: No Dressing: No Hobbies: No Electronic Signature(s) Signed: 08/08/2020 2:45:56 PM By: Baruch Gouty RN, BSN Signed: 08/08/2020 3:13:35 PM By: Kela Millin Entered By: Baruch Gouty on 08/08/2020 13:34:20 -------------------------------------------------------------------------------- Patient/Caregiver Education Details Patient Name: Date of Service: [name] LYN 9/3/2021andnbsp1:15 PM Medical Record Number: [id number] Patient Account Number: [id number] Date of Birth/Gender: Treating RN: 12/25/1960 (59 y.o. [name] Primary Care Physician: Olivia Canter, [place] Other Clinician: Referring Physician: Treating Physician/Extender: Marcille Buffy Weeks in Treatment: [phone] Education Assessment Education Provided To:  Patient Education Topics Provided Wound/Skin Impairment: Handouts: Caring for Your Ulcer Methods: Explain/Verbal Responses: State content correctly Electronic Signature(s) Signed: 08/08/2020 3:13:35 PM By: Kela Millin Entered By: Kela Millin on 08/08/2020 14:06:36 -------------------------------------------------------------------------------- Wound Assessment Details Patient Name: Date of Service: [name] LYN 08/08/2020 1:15 PM Medical Record Number:  [id number] Patient Account Number: [id number] Date of Birth/Sex: Treating RN: 17-Jul-1961 (59 y.o. [name] Primary Care Ramses Klecka: Olivia Canter, ZO E Other Clinician: Referring Shanyn Preisler: Treating Arbadella Kimbler/Extender: Gale Journey, ZO E Weeks in Treatment: 182 Wound Status Wound Number: 1 Primary Etiology: Pressure Ulcer Wound Location: Right Ischium Wound Status: Open Wounding Event: Pressure Injury Comorbid History: Hypertension, Type II Diabetes, Osteoarthritis, Neuropathy Date Acquired: 12/06/2005 Weeks Of Treatment: 182 Clustered Wound: Yes Wound Measurements Length: (cm) 0.7 Width: (cm) 0.6 Depth: (cm) 0.1 Clustered Quantity: 2 Area: (cm) 0.33 Volume: (cm) 0.033 % Reduction in Area: 98.2% % Reduction in Volume: 99.4% Epithelialization: Small (1-33%) Tunneling: No Undermining: No Wound Description Classification: Category/Stage III Wound Margin: Flat and Intact Exudate Amount: Small Exudate Type: Serosanguineous Exudate Color: red, brown Foul Odor After Cleansing: No Slough/Fibrino Yes Wound Bed Granulation Amount: Large (67-100%) Exposed Structure Granulation Quality: Red Fascia Exposed: No Necrotic Amount: None Present (0%) Fat Layer (Subcutaneous Tissue) Exposed: Yes Tendon Exposed: No Muscle Exposed: No Joint Exposed: No Bone Exposed: No Treatment Notes Wound #1 (Right Ischium) 3. Primary Dressing Applied Calcium Alginate Ag 4. Secondary Dressing ABD Pad 5. Secured With Recruitment consultant) Signed: 08/08/2020 2:45:56 PM By: Baruch Gouty RN, BSN Signed: 08/08/2020 3:13:35 PM By: Kela Millin Entered By: Baruch Gouty on 08/08/2020 13:34:46 -------------------------------------------------------------------------------- Wound Assessment Details Patient Name: Date of Service: Jaquita Rector SA LYN 08/08/2020 1:15 PM Medical Record Number: [id number] Patient Account Number: [id number] Date of Birth/Sex: Treating  RN: 11/25/1961 (59 y.o. Elam Dutch Primary Care Oluwademilade Mckiver: Olivia Canter, ZO E Other Clinician: Referring Kendi Defalco: Treating Gerldine Suleiman/Extender: Gale Journey, ZO E Weeks in Treatment: 182 Wound Status Wound Number: 10 Primary Etiology: Pressure Ulcer Wound Location: Right Gluteal fold Wound Status: Open Wounding Event: Pressure Injury Comorbid History: Hypertension, Type II Diabetes, Osteoarthritis, Neuropathy Date Acquired: 08/08/2020 Weeks Of Treatment: 0 Clustered Wound: No Wound Measurements Length: (cm) 0.2 Width: (cm) 0.5 Depth: (cm) 0.1 Area: (cm) 0.079 Volume: (cm) 0.008 % Reduction in Area: % Reduction in Volume: Epithelialization: Small (1-33%) Tunneling: No Undermining: No Wound Description Classification: Category/Stage III Wound Margin: Flat and Intact Exudate Amount: Small Exudate Type: Serous Exudate Color: amber Foul Odor After Cleansing: No Slough/Fibrino No Wound Bed Granulation Amount: Large (67-100%) Exposed Structure Granulation Quality: Red Fascia Exposed: No Necrotic Amount: None Present (0%) Fat Layer (Subcutaneous Tissue) Exposed: Yes Tendon Exposed: No Muscle Exposed: No Joint Exposed: No Bone Exposed: No Treatment Notes Wound #10 (Right Gluteal fold) 3. Primary Dressing Applied Calcium Alginate Ag 4. Secondary Dressing ABD Pad 5. Secured With Recruitment consultant) Signed: 08/08/2020 2:45:56 PM By: Baruch Gouty RN, BSN Entered By: Baruch Gouty on 08/08/2020 13:37:22 -------------------------------------------------------------------------------- Vitals Details Patient Name: Date of Service: Jaquita Rector SA LYN 08/08/2020 1:15 PM Medical Record Number: [id number] Patient Account Number: [id number] Date of Birth/Sex: Treating RN: 07/08/1961 (59 y.o. [name] Primary Care Mozetta Murfin: Olivia Canter, ZO E Other Clinician: Referring Willliam Pettet: Treating Thor Nannini/Extender: Gale Journey, ZO  E Weeks in Treatment: 182 Vital Signs Time Taken: 13:28 Temperature (F): 98.7 Height (in): 67 Pulse (bpm): 85 Weight (lbs): 220 Respiratory Rate (breaths/min): 18 Body Mass Index (BMI): 34.5 Blood  Pressure (mmHg): 143/64 Capillary Blood Glucose (mg/dl): 145 Reference Range: 80 - 120 mg / dl Electronic Signature(s) Signed: 08/12/2020 12:03:37 PM By: Sandre Kitty Entered By: Sandre Kitty on 08/08/2020 13:28:33

## 2020-08-13 DIAGNOSIS — L89310 Pressure ulcer of right buttock, unstageable: Secondary | ICD-10-CM | POA: Diagnosis not present

## 2020-08-13 DIAGNOSIS — Z6841 Body Mass Index (BMI) 40.0 and over, adult: Secondary | ICD-10-CM | POA: Diagnosis not present

## 2020-08-13 DIAGNOSIS — Z79899 Other long term (current) drug therapy: Secondary | ICD-10-CM | POA: Diagnosis not present

## 2020-08-13 DIAGNOSIS — D320 Benign neoplasm of cerebral meninges: Secondary | ICD-10-CM | POA: Diagnosis not present

## 2020-08-13 DIAGNOSIS — G35 Multiple sclerosis: Secondary | ICD-10-CM | POA: Diagnosis not present

## 2020-08-13 DIAGNOSIS — Z79891 Long term (current) use of opiate analgesic: Secondary | ICD-10-CM | POA: Diagnosis not present

## 2020-08-13 DIAGNOSIS — I10 Essential (primary) hypertension: Secondary | ICD-10-CM | POA: Diagnosis not present

## 2020-08-15 DIAGNOSIS — N318 Other neuromuscular dysfunction of bladder: Secondary | ICD-10-CM | POA: Diagnosis not present

## 2020-08-20 ENCOUNTER — Telehealth: Payer: Self-pay | Admitting: Neurology

## 2020-08-20 ENCOUNTER — Encounter: Payer: Self-pay | Admitting: *Deleted

## 2020-08-20 DIAGNOSIS — D329 Benign neoplasm of meninges, unspecified: Secondary | ICD-10-CM

## 2020-08-20 DIAGNOSIS — G35 Multiple sclerosis: Secondary | ICD-10-CM

## 2020-08-20 NOTE — Telephone Encounter (Signed)
Okay to refer to Dr. Tommi Rumps at [facility].   [facility] will have access to the imaging reports but she should still have a CD with the actual images (MRI of the brain and cervical spine 2021) that she takes with her.

## 2020-08-20 NOTE — Telephone Encounter (Signed)
It would be fine for her to get a second opinion from neruology if she wants.  We had referred her to neurosurgery after her imaging studies after the last visit.  It looks like she saw Dr. Christella Noa for the meningioma.  Did she ever have any surgery?

## 2020-08-20 NOTE — Telephone Encounter (Signed)
Pt is asking for a referral to another Neurologist for a 2nd opinion re: the results of her MRI

## 2020-08-20 NOTE — Telephone Encounter (Signed)
Referral placed to Dr Tressie Ellis, [facility]. Sent my chart and advised she call and get MRI CDs to take to Dr Tommi Rumps.

## 2020-08-20 NOTE — Addendum Note (Signed)
Addended by: Minna Antis on: 08/20/2020 05:07 PM   Modules accepted: Orders

## 2020-08-20 NOTE — Telephone Encounter (Signed)
Called patient and advised her of Dr Garth Bigness reply. She stated she has not had surgery  fo her meningioma, and she wants a second opinion with neurosurgeon, Dr Derrill Memo, [facility], f [phone], p [phone]. She asked for recent MRI results to be sent. I advised her that [facility] has access to Care Everywhere and can see all her records. She verbalized understanding, appreciation.

## 2020-08-27 ENCOUNTER — Ambulatory Visit (INDEPENDENT_AMBULATORY_CARE_PROVIDER_SITE_OTHER): Payer: Medicare Other | Admitting: Podiatry

## 2020-08-27 ENCOUNTER — Other Ambulatory Visit: Payer: Self-pay

## 2020-08-27 DIAGNOSIS — B351 Tinea unguium: Secondary | ICD-10-CM | POA: Diagnosis not present

## 2020-08-27 DIAGNOSIS — E1142 Type 2 diabetes mellitus with diabetic polyneuropathy: Secondary | ICD-10-CM | POA: Diagnosis not present

## 2020-08-27 DIAGNOSIS — E119 Type 2 diabetes mellitus without complications: Secondary | ICD-10-CM

## 2020-08-27 DIAGNOSIS — L84 Corns and callosities: Secondary | ICD-10-CM | POA: Diagnosis not present

## 2020-08-27 DIAGNOSIS — G35D Multiple sclerosis, unspecified: Secondary | ICD-10-CM

## 2020-08-27 DIAGNOSIS — G35 Multiple sclerosis: Secondary | ICD-10-CM

## 2020-08-28 ENCOUNTER — Other Ambulatory Visit: Payer: Self-pay | Admitting: Podiatry

## 2020-08-28 ENCOUNTER — Telehealth: Payer: Self-pay | Admitting: Podiatry

## 2020-08-28 MED ORDER — CLOTRIMAZOLE 1 % EX CREA: TOPICAL_CREAM | CUTANEOUS | 1 refills | Status: DC

## 2020-08-28 NOTE — Telephone Encounter (Signed)
Pt called because a cream for her feet was supposed to be called in. Her pharmacy is [facility].

## 2020-08-28 NOTE — Telephone Encounter (Signed)
Rx sent to [facility] for Clotrimazole Cream. Apply to both feet and between toes twice daily for 4 weeks. Given one refill. Thanks Elmyra Ricks!

## 2020-08-31 ENCOUNTER — Encounter: Payer: Self-pay | Admitting: Podiatry

## 2020-08-31 NOTE — Progress Notes (Signed)
Subjective: Alphia Behanna presents today for for annual diabetic foot examination and painful callus(es) plantar left foot and painful thick toenails that are difficult to trim. Painful toenails interfere with ambulation. Aggravating factors include wearing enclosed shoe gear. Pain is relieved with periodic professional debridement. Painful calluses are aggravated when weightbearing with and without shoegear. Pain is relieved with periodic professional debridement.Forrest Moron, MD is patient's PCP. Last visit 02/26/2020.  Past Medical History:  Diagnosis Date  . Anemia    history of  . Arthritis   . Decubitus ulcers    currently treated at wound center  . Diabetes mellitus without complication ([place])   . Diabetic polyneuropathy ([place])   . Endometrial cancer ([facility])    Grade 2  . Fibroids   . GERD (gastroesophageal reflux disease)   . Heartburn    Occ  . Hypertension   . Mixed hyperlipidemia   . Morbid obesity ([place])   . MS (multiple sclerosis) ([place])     Patient Active Problem List   Diagnosis Date Noted  . Nuclear sclerotic cataract of both eyes 01/23/2020  . Endometrial cancer ([place]) 11/21/2018  . Morbid obesity ([place]) 11/21/2018  . Pain management contract agreement 02/16/2017  . Mixed hyperlipidemia 11/24/2016  . Type 2 diabetes mellitus with diabetic polyneuropathy, without long-term current use of insulin ([place]) 11/24/2016  . Multiple sclerosis ([place]) 11/18/2016  . Leg weakness, bilateral 11/18/2016  . Dysesthesia 11/18/2016  . Diplegia of both lower extremities ([place]) 11/18/2016  . Diabetes, polyneuropathy ([place]) 11/18/2016  . Chronic indwelling Foley catheter 11/17/2016  . Essential hypertension 11/17/2016  . Slow transit constipation 11/17/2016  . Chronically on opiate therapy 11/17/2016  . Physical deconditioning 11/17/2016    Past Surgical History:  Procedure Laterality Date  . COLONOSCOPY    . CYSTOSCOPY N/A 01/22/2020   Procedure: CYSTOSCOPY WITH SUPRA PUBIC  TUBE CHANGE, BOTOX 200 UNITS, RETROGRADE PYELOGRAM BILATERAL. FULGERATION .5 CM-2CM;  Surgeon: Festus Aloe, MD;  Location: [facility];  Service: Urology;  Laterality: N/A;  . ESOPHAGOGASTRODUODENOSCOPY N/A 12/02/2017   Procedure: ESOPHAGOGASTRODUODENOSCOPY (EGD);  Surgeon: Ronnette Juniper, MD;  Location: [facility];  Service: Gastroenterology;  Laterality: N/A;  . ROBOTIC ASSISTED TOTAL HYSTERECTOMY WITH BILATERAL SALPINGO OOPHERECTOMY Bilateral 12/19/2018   Procedure: XI ROBOTIC ASSISTED TOTAL HYSTERECTOMY (UTERUS GREATER THAN 250gr) WITH BILATERAL SALPINGO OOPHORECTOMY;  Surgeon: Everitt Amber, MD;  Location: [facility];  Service: Gynecology;  Laterality: Bilateral;  . SENTINEL NODE BIOPSY N/A 12/19/2018   Procedure: SENTINEL NODE BIOPSY;  Surgeon: Everitt Amber, MD;  Location: [facility];  Service: Gynecology;  Laterality: N/A;  . SUPRAPUBIC CATHETER INSERTION  2009  . TONSILLECTOMY    . TUBAL LIGATION      Current Outpatient Medications on File Prior to Visit  Medication Sig Dispense Refill  . atorvastatin (LIPITOR) 10 MG tablet Take 10 mg by mouth at bedtime.     . baclofen (LIORESAL) 20 MG tablet Take one po tid plus two po at bedtime (Patient taking differently: Take 20-40 mg by mouth See admin instructions. Take 1 tablet (20 mg) by mouth 3 times daily & take 2 tablet (40 mg) by mouth at bedtime.) 150 tablet 11  . Catheters (DOVER UNIVERSAL FOLEY TRAY) KIT Use foley tray kit to change catheter every 3 weeks or as needed 15 each 1  . Catheters (FOLEY CATHETER 2-WAY) MISC Use to aspirate urine suprapubic 15 each 1  . Cholecalciferol (D3-1000) 1000 units capsule Take 1,000 Units by mouth 2 (two) times daily. MORNING & AFTERNOON.    Marland Kitchen  furosemide (LASIX) 20 MG tablet TAKE 1 TABLET BY MOUTH ONCE DAILY (Patient taking differently: Take 20 mg by mouth daily. ) 90 tablet 3  . glipiZIDE (GLUCOTROL) 5 MG tablet Take 1 tablet (5 mg total) by mouth daily before lunch. (Patient taking differently: Take 5 mg by mouth  daily. ) 90 tablet 3  . hydrochlorothiazide (HYDRODIURIL) 25 MG tablet TAKE 1 TABLET BY MOUTH ONCE DAILY EVERY MORNING 90 tablet 0  . hydrocortisone (ANUSOL-HC) 25 MG suppository Place 1 suppository (25 mg total) rectally 2 (two) times daily. (Patient taking differently: Place 25 mg rectally 2 (two) times daily as needed. ) 12 suppository 0  . hydrocortisone 2.5 % cream Place rectally.    . Incontinence Supply Disposable (TENA FLEX 16 PLUS) MISC Use daily as needed 30 each 11  . Incontinence Supply Disposable (TENA PROTECT UNDERWEAR PLS/XLG) MISC Use for incontinence. 30 each 11  . Interferon Beta-1b (BETASERON) 0.3 MG KIT injection Inject 0.3 mg into the skin every other day. (Patient taking differently: Inject 0.3 mg into the skin every other day. In the evening.) 36 each 3  . metFORMIN (GLUCOPHAGE) 500 MG tablet Take 500 mg by mouth 2 (two) times daily.  3  . MOVANTIK 25 MG TABS tablet Take 3 tablets (75 mg total) by mouth daily. (Patient taking differently: Take 75 mg by mouth every other day. In the morning) 90 tablet 4  . NON FORMULARY Power wheelchair repairs  Diagnosis code z99.3 1 each 0  . NON FORMULARY Dispense catheter urine drainage bags 90 Bag 3  . nystatin ointment (MYCOSTATIN) Apply to affected area twice daily as needed for irritation (Patient taking differently: Apply 1 application topically 2 (two) times daily as needed (irritation.). ) 30 g 6  . Ostomy Supplies (NEW IMAGE SKIN/FLANGE/TAPE) MISC 1 application by Does not apply route daily as needed. 30 each 11  . oxyCODONE-acetaminophen (PERCOCET) 10-325 MG tablet Take 1 tablet by mouth every 8 (eight) hours as needed for pain. 90 tablet 0  . potassium chloride (KLOR-CON) 10 MEQ tablet     . potassium chloride (MICRO-K) 10 MEQ CR capsule Take 30 mEq by mouth 2 (two) times daily.     . Syringe, Disposable, (B-D SYRINGE LUER-LOK 30CC) 30 ML MISC Use to change catheter 15 each 0  . vitamin B-12 (CYANOCOBALAMIN) 500 MCG tablet Take  500 mcg by mouth daily.     Current Facility-Administered Medications on File Prior to Visit  Medication Dose Route Frequency Provider Last Rate Last Admin  . botulinum toxin Type A (BOTOX) injection 200 Units  200 Units Intramuscular Once Jerilee Field, MD         No Known Allergies  Social History   Occupational History  . Not on file  Tobacco Use  . Smoking status: Never Smoker  . Smokeless tobacco: Never Used  Vaping Use  . Vaping Use: Never used  Substance and Sexual Activity  . Alcohol use: No  . Drug use: No  . Sexual activity: Not on file    Family History  Problem Relation Age of Onset  . Cancer Mother   . Hypertension Mother   . Heart disease Father   . Diabetes Father   . Hypertension Sister   . Hypertension Brother   . Breast cancer Neg Hx     Immunization History  Administered Date(s) Administered  . Tdap 01/09/2020     Objective: There were no vitals filed for this visit.  Kelissa Merlin is a pleasant 59  y.o. female in NAD. AAO X 3.  Vascular Examination: Capillary refill time to digits immediate b/l. Palpable pedal pulses b/l LE. Pedal hair absent. Lower extremity skin temperature gradient within normal limits. Nonpitting edema noted b/l lower extremities, left foot and right foot.  Dermatological Examination: Pedal skin with normal turgor, texture and tone bilaterally. No open wounds bilaterally. No interdigital macerations bilaterally. Toenails 1-5 b/l elongated, discolored, dystrophic, thickened, crumbly with subungual debris and tenderness to dorsal palpation. Hyperkeratotic lesion(s) submet head 5 left foot.  No erythema, no edema, no drainage, no flocculence.  Musculoskeletal Examination: Normal muscle strength 5/5 to all lower extremity muscle groups bilaterally. No pain crepitus or joint limitation noted with ROM b/l. No gross bony deformities bilaterally. Utilizes motorized chair for mobility assistance.  Footwear Assessment: Does the  patient wear appropriate shoes? Yes . Does the patient need inserts/orthotics? Yes.  Neurological Examination: Protective sensation diminished with 10g monofilament b/l. Vibratory sensation diminished b/l. Proprioception intact bilaterally.  Hemoglobin A1C Latest Ref Rng & Units 12/19/2019  HGBA1C 4.8 - 5.6 % 6.9(H)  Some recent data might be hidden   Assessment: 1. Onychomycosis   2. Callus   3. Multiple sclerosis ([place])   4. Diabetic peripheral neuropathy associated with type 2 diabetes mellitus ([place])   5. Encounter for diabetic foot exam ([place])      Risk Categorization: High Risk  Patient has one or more of the following: Loss of protective sensation Absent pedal pulses Severe Foot deformity History of foot ulcer  Plan: -Examined patient. -No new findings. No new orders. -Diabetic foot examination performed on today's visit. -Toenails 1-5 b/l were debrided in length and girth with sterile nail nippers and dremel without iatrogenic bleeding.  -Callus(es) submet head 5 left foot pared utilizing sterile scalpel blade without complication or incident. Total number debrided =1. -Patient to report any pedal injuries to medical professional immediately. -Patient to continue soft, supportive shoe gear daily. -Patient/POA to call should there be question/concern in the interim.  Return in about 3 months (around 11/26/2020).  Marzetta Board, DPM

## 2020-09-05 ENCOUNTER — Encounter (HOSPITAL_BASED_OUTPATIENT_CLINIC_OR_DEPARTMENT_OTHER): Payer: Medicare Other | Attending: Internal Medicine | Admitting: Internal Medicine

## 2020-09-05 DIAGNOSIS — E1151 Type 2 diabetes mellitus with diabetic peripheral angiopathy without gangrene: Secondary | ICD-10-CM | POA: Diagnosis not present

## 2020-09-05 DIAGNOSIS — L89313 Pressure ulcer of right buttock, stage 3: Secondary | ICD-10-CM | POA: Diagnosis not present

## 2020-09-05 DIAGNOSIS — E11622 Type 2 diabetes mellitus with other skin ulcer: Secondary | ICD-10-CM | POA: Diagnosis not present

## 2020-09-05 DIAGNOSIS — Z993 Dependence on wheelchair: Secondary | ICD-10-CM | POA: Diagnosis not present

## 2020-09-05 DIAGNOSIS — I10 Essential (primary) hypertension: Secondary | ICD-10-CM | POA: Insufficient documentation

## 2020-09-05 DIAGNOSIS — G35 Multiple sclerosis: Secondary | ICD-10-CM | POA: Diagnosis not present

## 2020-09-05 DIAGNOSIS — E1140 Type 2 diabetes mellitus with diabetic neuropathy, unspecified: Secondary | ICD-10-CM | POA: Diagnosis not present

## 2020-09-05 DIAGNOSIS — M1990 Unspecified osteoarthritis, unspecified site: Secondary | ICD-10-CM | POA: Diagnosis not present

## 2020-09-05 HISTORY — PX: BRAIN TUMOR EXCISION: SHX577
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
## 2020-09-05 NOTE — Progress Notes (Signed)
MARGRETE, DELUDE ([id number]) Visit Report for 09/05/2020 Arrival Information Details Patient Name: Date of Service: [name] [facility] 09/05/2020 2:15 PM Medical Record Number: [id number] Patient Account Number: [id number] Date of Birth/Sex: Treating RN: 14-Apr-1961 (59 y.o. Orvan Falconer Primary Care Goro Wenrick: Olivia Canter, ZO E Other Clinician: Referring Teffany Blaszczyk: Treating Maksym Pfiffner/Extender: Gale Journey, ZO E Weeks in Treatment: 39 Visit Information History Since Last Visit All ordered tests and consults were completed: No Patient Arrived: Wheel Chair Added or deleted any medications: No Arrival Time: 14:41 Any new allergies or adverse reactions: No Accompanied By: self Had a fall or experienced change in No Transfer Assistance: Civil Service fast streamer activities of daily living that may affect Patient Identification Verified: Yes risk of falls: Secondary Verification Process Completed: Yes Signs or symptoms of abuse/neglect since last visito No Patient Requires Transmission-Based Precautions: No Hospitalized since last visit: No Patient Has Alerts: No Implantable device outside of the clinic excluding No cellular tissue based products placed in the center since last visit: Has Dressing in Place as Prescribed: Yes Pain Present Now: No Electronic Signature(s) Signed: 09/05/2020 4:20:16 PM By: Carlene Coria RN Entered By: Carlene Coria on 09/05/2020 14:42:04 -------------------------------------------------------------------------------- Clinic Level of Care Assessment Details Patient Name: Date of Service: [name] [facility] 09/05/2020 2:15 PM Medical Record Number: [id number] Patient Account Number: [id number] Date of Birth/Sex: Treating RN: 1961-09-17 (59 y.o. [name] Primary Care Aleksander Edmiston: Olivia Canter, ZO E Other Clinician: Referring Mikayah Joy: Treating Randon Somera/Extender: Gale Journey, ZO E Weeks in Treatment: 186 Clinic Level of Care Assessment Items TOOL  4 Quantity Score X- 1 0 Use when only an EandM is performed on FOLLOW-UP visit ASSESSMENTS - Nursing Assessment / Reassessment X- 1 10 Reassessment of Co-morbidities (includes updates in patient status) X- 1 5 Reassessment of Adherence to Treatment Plan ASSESSMENTS - Wound and Skin A ssessment / Reassessment X - Simple Wound Assessment / Reassessment - one wound 1 5 []  - 0 Complex Wound Assessment / Reassessment - multiple wounds []  - 0 Dermatologic / Skin Assessment (not related to wound area) ASSESSMENTS - Focused Assessment []  - 0 Circumferential Edema Measurements - multi extremities []  - 0 Nutritional Assessment / Counseling / Intervention []  - 0 Lower Extremity Assessment (monofilament, tuning fork, pulses) []  - 0 Peripheral Arterial Disease Assessment (using hand held doppler) ASSESSMENTS - Ostomy and/or Continence Assessment and Care []  - 0 Incontinence Assessment and Management []  - 0 Ostomy Care Assessment and Management (repouching, etc.) PROCESS - Coordination of Care X - Simple Patient / Family Education for ongoing care 1 15 []  - 0 Complex (extensive) Patient / Family Education for ongoing care X- 1 10 Staff obtains Programmer, systems, Records, T Results / Process Orders est []  - 0 Staff telephones HHA, Nursing Homes / Clarify orders / etc []  - 0 Routine Transfer to another Facility (non-emergent condition) []  - 0 Routine Hospital Admission (non-emergent condition) []  - 0 New Admissions / Biomedical engineer / Ordering NPWT Apligraf, etc. , []  - 0 Emergency Hospital Admission (emergent condition) X- 1 10 Simple Discharge Coordination []  - 0 Complex (extensive) Discharge Coordination PROCESS - Special Needs []  - 0 Pediatric / Minor Patient Management []  - 0 Isolation Patient Management []  - 0 Hearing / Language / Visual special needs []  - 0 Assessment of Community assistance (transportation, D/C planning, etc.) []  - 0 Additional assistance / Altered  mentation []  - 0 Support Surface(s) Assessment (bed, cushion, seat, etc.) INTERVENTIONS - Wound Cleansing / Measurement X -  Simple Wound Cleansing - one wound 1 5 []  - 0 Complex Wound Cleansing - multiple wounds X- 1 5 Wound Imaging (photographs - any number of wounds) []  - 0 Wound Tracing (instead of photographs) X- 1 5 Simple Wound Measurement - one wound []  - 0 Complex Wound Measurement - multiple wounds INTERVENTIONS - Wound Dressings []  - 0 Small Wound Dressing one or multiple wounds []  - 0 Medium Wound Dressing one or multiple wounds []  - 0 Large Wound Dressing one or multiple wounds []  - 0 Application of Medications - topical []  - 0 Application of Medications - injection INTERVENTIONS - Miscellaneous []  - 0 External ear exam []  - 0 Specimen Collection (cultures, biopsies, blood, body fluids, etc.) []  - 0 Specimen(s) / Culture(s) sent or taken to Lab for analysis X- 1 10 Patient Transfer (multiple staff / Civil Service fast streamer / Similar devices) []  - 0 Simple Staple / Suture removal (25 or less) []  - 0 Complex Staple / Suture removal (26 or more) []  - 0 Hypo / Hyperglycemic Management (close monitor of Blood Glucose) []  - 0 Ankle / Brachial Index (ABI) - do not check if billed separately X- 1 5 Vital Signs Has the patient been seen at the hospital within the last three years: Yes Total Score: 85 Level Of Care: New/Established - Level 3 Electronic Signature(s) Signed: 09/05/2020 4:10:39 PM By: Kela Millin Entered By: Kela Millin on 09/05/2020 14:57:44 -------------------------------------------------------------------------------- Multi Wound Chart Details Patient Name: Date of Service: [name], [place] [facility] 09/05/2020 2:15 PM Medical Record Number: [id number] Patient Account Number: [id number] Date of Birth/Sex: Treating RN: 1961/03/30 (58 y.o. [name] Primary Care Maydell Knoebel: Olivia Canter, ZO E Other Clinician: Referring Babbie Dondlinger: Treating  Muneeb Veras/Extender: Gale Journey, ZO E Weeks in Treatment: 186 Vital Signs Height(in): 67 Pulse(bpm): 72 Weight(lbs): 220 Blood Pressure(mmHg): 127/71 Body Mass Index(BMI): 34 Temperature(F): 98.5 Respiratory Rate(breaths/min): 18 Photos: [1:No Photos Right Ischium] [10:No Photos Right Gluteal fold] [N/A:N/A N/A] Wound Location: [1:Pressure Injury] [10:Pressure Injury] [N/A:N/A] Wounding Event: [1:Pressure Ulcer] [10:Pressure Ulcer] [N/A:N/A] Primary Etiology: [1:Hypertension, Type II Diabetes,] [10:Hypertension, Type II Diabetes,] [N/A:N/A] Comorbid History: [1:Osteoarthritis, Neuropathy 12/06/2005] [10:Osteoarthritis, Neuropathy 08/08/2020] [N/A:N/A] Date Acquired: [1:186] [10:4] [N/A:N/A] Weeks of Treatment: [1:Healed - Epithelialized] [10:Healed - Epithelialized] [N/A:N/A] Wound Status: [1:Yes] [10:No] [N/A:N/A] Clustered Wound: [1:2] [10:N/A] [N/A:N/A] Clustered Quantity: [1:0x0x0] [10:0x0x0] [N/A:N/A] Measurements L x W x D (cm) [1:0] [10:0] [N/A:N/A] A (cm) : rea [1:0] [10:0] [N/A:N/A] Volume (cm) : [1:100.00%] [10:100.00%] [N/A:N/A] % Reduction in A rea: [1:100.00%] [10:100.00%] [N/A:N/A] % Reduction in Volume: [1:Category/Stage III] [10:Category/Stage III] [N/A:N/A] Classification: [1:None Present] [10:None Present] [N/A:N/A] Exudate A mount: [1:Flat and Intact] [10:Flat and Intact] [N/A:N/A] Wound Margin: [1:None Present (0%)] [10:None Present (0%)] [N/A:N/A] Granulation A mount: [1:None Present (0%)] [10:None Present (0%)] [N/A:N/A] Necrotic A mount: [1:Fascia: No] [10:Fascia: No] [N/A:N/A] Exposed Structures: [1:Fat Layer (Subcutaneous Tissue): No Tendon: No Muscle: No Joint: No Bone: No Small (1-33%)] [10:Fat Layer (Subcutaneous Tissue): No Tendon: No Muscle: No Joint: No Bone: No Small (1-33%)] [N/A:N/A] Treatment Notes Electronic Signature(s) Signed: 09/05/2020 4:10:39 PM By: Kela Millin Signed: 09/05/2020 4:24:19 PM By: Linton Ham  MD Entered By: Linton Ham on 09/05/2020 15:06:49 -------------------------------------------------------------------------------- Multi-Disciplinary Care Plan Details Patient Name: Date of Service: [name], [place] [facility] 09/05/2020 2:15 PM Medical Record Number: [id number] Patient Account Number: [id number] Date of Birth/Sex: Treating RN: 04/22/1961 (59 y.o. [name] Primary Care Tashawnda Bleiler: Olivia Canter, ZO E Other Clinician: Referring Emryn Flanery: Treating Adi Doro/Extender: Gale Journey, ZO E  Weeks in Treatment: 186 Active Inactive Electronic Signature(s) Signed: 09/05/2020 4:10:39 PM By: Kela Millin Entered By: Kela Millin on 09/05/2020 14:52:26 -------------------------------------------------------------------------------- Pain Assessment Details Patient Name: Date of Service: [name] [facility] 09/05/2020 2:15 PM Medical Record Number: [id number] Patient Account Number: [id number] Date of Birth/Sex: Treating RN: 1961/02/17 (59 y.o. Orvan Falconer Primary Care Montray Kliebert: Olivia Canter, [place] Other Clinician: Referring Joeann Steppe: Treating Madisynn Plair/Extender: Gale Journey, ZO E Weeks in Treatment: 186 Active Problems Location of Pain Severity and Description of Pain Patient Has Paino No Site Locations Pain Management and Medication Current Pain Management: Electronic Signature(s) Signed: 09/05/2020 4:20:16 PM By: Carlene Coria RN Entered By: Carlene Coria on 09/05/2020 14:42:41 -------------------------------------------------------------------------------- Wound Assessment Details Patient Name: Date of Service: [name] [facility] 09/05/2020 2:15 PM Medical Record Number: [id number] Patient Account Number: [id number] Date of Birth/Sex: Treating RN: May 12, 1961 (59 y.o. Orvan Falconer Primary Care Sandor Arboleda: Olivia Canter, ZO E Other Clinician: Referring Herberto Ledwell: Treating Shasha Buchbinder/Extender: Gale Journey, ZO E Weeks in Treatment:  186 Wound Status Wound Number: 1 Primary Etiology: Pressure Ulcer Wound Location: Right Ischium Wound Status: Healed - Epithelialized Wounding Event: Pressure Injury Comorbid History: Hypertension, Type II Diabetes, Osteoarthritis, Neuropathy Date Acquired: 12/06/2005 Weeks Of Treatment: 186 Clustered Wound: Yes Wound Measurements Length: (cm) Width: (cm) Depth: (cm) Clustered Quantity: Area: (cm) Volume: (cm) 0 % Reduction in Area: 100% 0 % Reduction in Volume: 100% 0 Epithelialization: Small (1-33%) 2 Tunneling: No 0 Undermining: No 0 Wound Description Classification: Category/Stage III Wound Margin: Flat and Intact Exudate Amount: None Present Foul Odor After Cleansing: No Slough/Fibrino No Wound Bed Granulation Amount: None Present (0%) Exposed Structure Necrotic Amount: None Present (0%) Fascia Exposed: No Fat Layer (Subcutaneous Tissue) Exposed: No Tendon Exposed: No Muscle Exposed: No Joint Exposed: No Bone Exposed: No Electronic Signature(s) Signed: 09/05/2020 4:10:39 PM By: Kela Millin Signed: 09/05/2020 4:20:16 PM By: Carlene Coria RN Entered By: Kela Millin on 09/05/2020 14:52:59 -------------------------------------------------------------------------------- Wound Assessment Details Patient Name: Date of Service: [name], [place] [facility] 09/05/2020 2:15 PM Medical Record Number: [id number] Patient Account Number: [id number] Date of Birth/Sex: Treating RN: 09-Jan-1961 (59 y.o. Orvan Falconer Primary Care Rayssa Atha: Olivia Canter, ZO E Other Clinician: Referring Laurent Cargile: Treating Illene Sweeting/Extender: Gale Journey, ZO E Weeks in Treatment: 186 Wound Status Wound Number: 10 Primary Etiology: Pressure Ulcer Wound Location: Right Gluteal fold Wound Status: Healed - Epithelialized Wounding Event: Pressure Injury Comorbid History: Hypertension, Type II Diabetes, Osteoarthritis, Neuropathy Date Acquired: 08/08/2020 Weeks Of Treatment:  4 Clustered Wound: No Wound Measurements Length: (cm) Width: (cm) Depth: (cm) Area: (cm) Volume: (cm) 0 % Reduction in Area: 100% 0 % Reduction in Volume: 100% 0 Epithelialization: Small (1-33%) 0 Tunneling: No 0 Undermining: No Wound Description Classification: Category/Stage III Wound Margin: Flat and Intact Exudate Amount: None Present Foul Odor After Cleansing: No Slough/Fibrino No Wound Bed Granulation Amount: None Present (0%) Exposed Structure Necrotic Amount: None Present (0%) Fascia Exposed: No Fat Layer (Subcutaneous Tissue) Exposed: No Tendon Exposed: No Muscle Exposed: No Joint Exposed: No Bone Exposed: No Electronic Signature(s) Signed: 09/05/2020 4:10:39 PM By: Kela Millin Signed: 09/05/2020 4:20:16 PM By: Carlene Coria RN Entered By: Kela Millin on 09/05/2020 14:54:28 -------------------------------------------------------------------------------- Vitals Details Patient Name: Date of Service: [name], [place] [facility] 09/05/2020 2:15 PM Medical Record Number: [id number] Patient Account Number: [id number] Date of Birth/Sex: Treating RN: 08-07-1961 (59 y.o. Orvan Falconer Primary Care Vito Beg: Olivia Canter, ZO E Other Clinician: Referring Wyndi Northrup: Treating  Polette Nofsinger/Extender: Gale Journey, ZO E Weeks in Treatment: 186 Vital Signs Time Taken: 14:42 Temperature (F): 98.5 Height (in): 67 Pulse (bpm): 72 Weight (lbs): 220 Respiratory Rate (breaths/min): 18 Body Mass Index (BMI): 34.5 Blood Pressure (mmHg): 127/71 Reference Range: 80 - 120 mg / dl Electronic Signature(s) Signed: 09/05/2020 4:20:16 PM By: Carlene Coria RN Entered By: Carlene Coria on 09/05/2020 14:42:33

## 2020-09-12 DIAGNOSIS — N318 Other neuromuscular dysfunction of bladder: Secondary | ICD-10-CM | POA: Diagnosis not present

## 2020-09-15 ENCOUNTER — Telehealth: Payer: Self-pay | Admitting: *Deleted

## 2020-09-15 NOTE — Telephone Encounter (Signed)
Late entry from early afternoon: Faxed completed/signed medicaid transportation exception verification form back to [facility] transportation department at [phone] and [phone]. This is for pt to get transportation to [facility] in [place], [place] for second opinion on brain tumor.

## 2020-09-16 DIAGNOSIS — C541 Malignant neoplasm of endometrium: Secondary | ICD-10-CM | POA: Diagnosis not present

## 2020-09-16 DIAGNOSIS — I10 Essential (primary) hypertension: Secondary | ICD-10-CM | POA: Diagnosis not present

## 2020-09-16 DIAGNOSIS — D499 Neoplasm of unspecified behavior of unspecified site: Secondary | ICD-10-CM | POA: Diagnosis not present

## 2020-09-16 DIAGNOSIS — E1142 Type 2 diabetes mellitus with diabetic polyneuropathy: Secondary | ICD-10-CM | POA: Diagnosis not present

## 2020-09-16 DIAGNOSIS — Z01818 Encounter for other preprocedural examination: Secondary | ICD-10-CM | POA: Diagnosis not present

## 2020-09-16 DIAGNOSIS — Z6740 Type O blood, Rh positive: Secondary | ICD-10-CM | POA: Diagnosis not present

## 2020-09-16 DIAGNOSIS — D329 Benign neoplasm of meninges, unspecified: Secondary | ICD-10-CM | POA: Diagnosis not present

## 2020-09-16 DIAGNOSIS — Z8639 Personal history of other endocrine, nutritional and metabolic disease: Secondary | ICD-10-CM | POA: Diagnosis not present

## 2020-09-16 DIAGNOSIS — G35 Multiple sclerosis: Secondary | ICD-10-CM | POA: Diagnosis not present

## 2020-09-16 DIAGNOSIS — Z1159 Encounter for screening for other viral diseases: Secondary | ICD-10-CM | POA: Diagnosis not present

## 2020-09-23 ENCOUNTER — Telehealth: Payer: Self-pay | Admitting: *Deleted

## 2020-09-23 NOTE — Telephone Encounter (Signed)
Returned the patient's call and scheduled a follow up appt for 11/10

## 2020-10-01 DIAGNOSIS — I10 Essential (primary) hypertension: Secondary | ICD-10-CM | POA: Diagnosis present

## 2020-10-01 DIAGNOSIS — Z20822 Contact with and (suspected) exposure to covid-19: Secondary | ICD-10-CM | POA: Diagnosis present

## 2020-10-01 DIAGNOSIS — Z9359 Other cystostomy status: Secondary | ICD-10-CM | POA: Diagnosis not present

## 2020-10-01 DIAGNOSIS — R739 Hyperglycemia, unspecified: Secondary | ICD-10-CM | POA: Diagnosis not present

## 2020-10-01 DIAGNOSIS — E1142 Type 2 diabetes mellitus with diabetic polyneuropathy: Secondary | ICD-10-CM | POA: Diagnosis present

## 2020-10-01 DIAGNOSIS — G939 Disorder of brain, unspecified: Secondary | ICD-10-CM | POA: Diagnosis not present

## 2020-10-01 DIAGNOSIS — Z90710 Acquired absence of both cervix and uterus: Secondary | ICD-10-CM | POA: Diagnosis not present

## 2020-10-01 DIAGNOSIS — G8929 Other chronic pain: Secondary | ICD-10-CM | POA: Diagnosis present

## 2020-10-01 DIAGNOSIS — G936 Cerebral edema: Secondary | ICD-10-CM | POA: Diagnosis present

## 2020-10-01 DIAGNOSIS — Z66 Do not resuscitate: Secondary | ICD-10-CM | POA: Diagnosis present

## 2020-10-01 DIAGNOSIS — Z993 Dependence on wheelchair: Secondary | ICD-10-CM | POA: Diagnosis not present

## 2020-10-01 DIAGNOSIS — Z7984 Long term (current) use of oral hypoglycemic drugs: Secondary | ICD-10-CM | POA: Diagnosis not present

## 2020-10-01 DIAGNOSIS — E785 Hyperlipidemia, unspecified: Secondary | ICD-10-CM | POA: Diagnosis present

## 2020-10-01 DIAGNOSIS — G35 Multiple sclerosis: Secondary | ICD-10-CM | POA: Diagnosis present

## 2020-10-01 DIAGNOSIS — D329 Benign neoplasm of meninges, unspecified: Secondary | ICD-10-CM | POA: Diagnosis not present

## 2020-10-01 DIAGNOSIS — D320 Benign neoplasm of cerebral meninges: Secondary | ICD-10-CM | POA: Diagnosis present

## 2020-10-01 DIAGNOSIS — N390 Urinary tract infection, site not specified: Secondary | ICD-10-CM | POA: Diagnosis present

## 2020-10-01 DIAGNOSIS — E669 Obesity, unspecified: Secondary | ICD-10-CM | POA: Diagnosis present

## 2020-10-15 ENCOUNTER — Inpatient Hospital Stay: Payer: Medicare Other | Attending: Gynecologic Oncology | Admitting: Gynecologic Oncology

## 2020-10-15 ENCOUNTER — Encounter: Payer: Self-pay | Admitting: Gynecologic Oncology

## 2020-10-15 ENCOUNTER — Other Ambulatory Visit: Payer: Self-pay

## 2020-10-15 VITALS — BP 123/45 | HR 83 | Temp 98.1°F | Resp 18

## 2020-10-15 DIAGNOSIS — I10 Essential (primary) hypertension: Secondary | ICD-10-CM | POA: Diagnosis not present

## 2020-10-15 DIAGNOSIS — E782 Mixed hyperlipidemia: Secondary | ICD-10-CM | POA: Diagnosis not present

## 2020-10-15 DIAGNOSIS — G35 Multiple sclerosis: Secondary | ICD-10-CM | POA: Diagnosis not present

## 2020-10-15 DIAGNOSIS — Z90722 Acquired absence of ovaries, bilateral: Secondary | ICD-10-CM

## 2020-10-15 DIAGNOSIS — Z79899 Other long term (current) drug therapy: Secondary | ICD-10-CM | POA: Insufficient documentation

## 2020-10-15 DIAGNOSIS — M1990 Unspecified osteoarthritis, unspecified site: Secondary | ICD-10-CM | POA: Insufficient documentation

## 2020-10-15 DIAGNOSIS — Z923 Personal history of irradiation: Secondary | ICD-10-CM | POA: Diagnosis not present

## 2020-10-15 DIAGNOSIS — E1142 Type 2 diabetes mellitus with diabetic polyneuropathy: Secondary | ICD-10-CM | POA: Diagnosis not present

## 2020-10-15 DIAGNOSIS — K219 Gastro-esophageal reflux disease without esophagitis: Secondary | ICD-10-CM | POA: Diagnosis not present

## 2020-10-15 DIAGNOSIS — C541 Malignant neoplasm of endometrium: Secondary | ICD-10-CM | POA: Diagnosis not present

## 2020-10-15 DIAGNOSIS — Z90710 Acquired absence of both cervix and uterus: Secondary | ICD-10-CM

## 2020-10-15 DIAGNOSIS — Z7984 Long term (current) use of oral hypoglycemic drugs: Secondary | ICD-10-CM | POA: Insufficient documentation

## 2020-10-15 NOTE — Progress Notes (Signed)
Follow-up Note: Gyn-Onc  Consult was initially requested by Dr. Mora Bellman for the evaluation of [name] 59 y.o. female  CC:  Chief Complaint  Patient presents with  . Endometrial cancer [facility])    Assessment/Plan:  [name]  is a 59 y.o.  year old with MS (wheelchair bound) with a history of stage IB grade 2 endometrial cancer with positive ITC's in the SLN's and LVSI.  High/intermediate risk factors, s/p WPRT and vaginal brachytherapy completed May, 2020.   I recommend she follow-up at 3 monthly intervals for symptom review, physical examination and pelvic examination. Pap smear is not recommended in routine endometrial cancer surveillance. After 2 years we will space these visits to every 6 months, and then annually if recurrence has not developed within 5 years. All questions were answered.  HPI: [name] is a 59 year old P1 who is seen in consultation at the request of Dr Elly Modena for grade 2 endometrial cancer.  The patient reports a history of vaginal bleeding for 6 months (since the summer of 2019).  Formed a transvaginal ultrasound scan on November 15, 2018 which revealed a uterus measuring 11.5 x 7.2 x 7.3 cm with a posterior lower uterine segment myoma measuring approximately 6 cm.  The endometrium was thickened to 34 mm.  The left and right ovaries were not clearly visualized.  An endometrial biopsy was then taken in the office which revealed FIGO grade 2 endometrioid adenocarcinoma.  The patient has a medical history significant for multiple sclerosis.  She is wheelchair-bound.  She has a suprapubic catheter.  She has spasms and no function in her lower extremities. She lives with her daughter.  She is independent through the day in her motorized wheelchair.  She requires Hoyer lift for transfers.    A suprapubic catheter was placed in 2009 and then later replaced.  Her neurologist is Dr. Felecia Shelling. Endometrial biopsy was performed on 11/10/18 which showed FIGO  grade 2 endometrial cancer. [place] on November 15, 2018 showed a uterus measuring 11.5 x 7.2 x 7.3 cm with a 5 cm posterior leiomyoma in the mid to lower uterine segment.  There was a thickened endometrium at 34 mm.  The ovaries were not visualized.  There is no free fluid.  On 12/19/18 she underwent robotic assisted total hysterectomy BSO sentinel lymph node biopsy.  The uterus is greater than 250 g.  The procedure was complicated by morbid obesity and multiple sclerosis, paralysis.  Final pathology revealed a FIGO grade 2 endometrial cancer with outer half myometrial invasion (30 mm of 40 mm depth invasion close ).  LVSI was identified.  There was carcinoma involving the lower uterine segment.  The tumor size was 9.5 cm.  Ovaries and fallopian tubes and cervix were unremarkable.  There were no macro micrometastases and sentinel lymph nodes however isolated tumor cells were identified in the left obturator sentinel lymph node.  She was staged as FIGO stage Ib grade 2 adenocarcinoma of the endometrium with high risk features and recommendation for adjuvant radiation was made in accordance with NCCN guidelines.  She received adjuvant radiation for her high risk early stage endometrial cancer. Radiation treatment dates:   1. IMRT: 02/14/19-03/20/19                                                  2.  HDR Ir-192: 03/27/19, 04/04/19, 04/11/19   Site/dose: 1. Pelvis; 25 fractions of 1.8 Gy for a total of 45 Gy                    2. Vagina, 6 Gy in 3 fractions for a total dose of 18 Gy  Interval Hx:  Today she presents for scheduled follow up.  She had brain surgery in October, 2021 for a benign brain tumor. This was at [facility]. She did well from surgery with no complications.   She reports no symptoms concerning for recurrence.   Current Meds:   Current Outpatient Medications:  .  Adhesive Tape (PAPER TAPE 1"X10YD) TAPE, Apply to dressings as needed, Disp: 4 each, Rfl: 6 .  atorvastatin (LIPITOR)  10 MG tablet, Take 10 mg by mouth daily. , Disp: , Rfl:  .  baclofen (LIORESAL) 20 MG tablet, TAKE 1 TABLET BY MOUTH FOUR TIMES A DAY**OFFICE VISIT NEEDED**, Disp: 120 tablet, Rfl: 0 .  Catheters (DOVER UNIVERSAL FOLEY TRAY) KIT, Use foley tray kit to change catheter every 3 weeks or as needed, Disp: 15 each, Rfl: 1 .  Catheters (FOLEY CATHETER 2-WAY) MISC, Use to aspirate urine suprapubic, Disp: 15 each, Rfl: 1 .  Cholecalciferol (D3-1000) 1000 units capsule, Take 2,000 Units by mouth daily. , Disp: , Rfl:  .  Control Gel Formula Dressing (DUODERM CGF DRESSING) MISC, Apply 1 each topically daily as needed., Disp: 5 each, Rfl: 11 .  cyanocobalamin 500 MCG tablet, Take 500 mcg by mouth daily., Disp: , Rfl:  .  Disposable Gloves MISC, Use disposable gloves to change catheter as needed, Disp: 200 each, Rfl: 11 .  furosemide (LASIX) 20 MG tablet, Take 1 tablet (20 mg total) by mouth daily., Disp: 90 tablet, Rfl: 0 .  glipiZIDE (GLUCOTROL) 5 MG tablet, Take 1 tablet (5 mg total) by mouth daily before lunch., Disp: 90 tablet, Rfl: 3 .  hydrochlorothiazide (HYDRODIURIL) 25 MG tablet, TAKE 1 TABLET BY MOUTH EVERY MORNING, Disp: 90 tablet, Rfl: 0 .  Incontinence Supply Disposable (TENA FLEX 16 PLUS) MISC, Use daily as needed, Disp: 30 each, Rfl: 11 .  Incontinence Supply Disposable (TENA PROTECT UNDERWEAR PLS/XLG) MISC, Use for incontinence., Disp: 30 each, Rfl: 11 .  Interferon Beta-1b (BETASERON) 0.3 MG KIT injection, Inject 0.3 mg into the skin every other day., Disp: 36 each, Rfl: 3 .  lidocaine (XYLOCAINE) 2 % jelly, USE when need with dressings, Disp: , Rfl: 1 .  metFORMIN (GLUCOPHAGE) 1000 MG tablet, Take 1,000 mg by mouth 2 (two) times daily with a meal., Disp: , Rfl:  .  Methenamine-Sodium Salicylate (AZO URINARY TRACT DEFENSE) 162-162.5 MG TABS, Take as instructed, Disp: , Rfl:  .  MOVANTIK 25 MG TABS tablet, Take 25 mg by mouth every morning., Disp: , Rfl: 4 .  NON FORMULARY, Power wheelchair  repairs  Diagnosis code z99.3, Disp: 1 each, Rfl: 0 .  NON FORMULARY, Dispense catheter urine drainage bags, Disp: 90 Bag, Rfl: 3 .  nystatin ointment (MYCOSTATIN), Apply to affected area twice daily as needed for irritation, Disp: 30 g, Rfl: 6 .  Ostomy Supplies (NEW IMAGE SKIN/FLANGE/TAPE) MISC, 1 application by Does not apply route daily as needed., Disp: 30 each, Rfl: 11 .  oxyCODONE (OXYCONTIN) 20 mg 12 hr tablet, Take 1 tablet (20 mg total) by mouth 3 (three) times daily as needed (pain)., Disp: 90 tablet, Rfl: 0 .  oxyCODONE-acetaminophen (PERCOCET) 10-325 MG tablet, Take 1 tablet by mouth  every 8 (eight) hours as needed for pain. Needs office visit., Disp: 90 tablet, Rfl: 0 .  potassium chloride (K-DUR,KLOR-CON) 10 MEQ tablet, TAKE 3 TABLETS BY MOUTH TWICE DAILY**OFFICE VISIT NEEDED FOR REFILLS**, Disp: 180 tablet, Rfl: 0 .  Syringe, Disposable, (B-D SYRINGE LUER-LOK 30CC) 30 ML MISC, Use to change catheter, Disp: 15 each, Rfl: 0 .  Wound Dressings (GRX HYDROGEL GAUZE 4X4) PADS, Use gauze to clean wound daily as needed, Disp: 200 each, Rfl: 11   Allergy: No Known Allergies  Social Hx:   Social History   Socioeconomic History  . Marital status: Single    Spouse name: Not on file  . Number of children: Not on file  . Years of education: Not on file  . Highest education level: Not on file  Occupational History  . Not on file  Tobacco Use  . Smoking status: Never Smoker  . Smokeless tobacco: Never Used  Vaping Use  . Vaping Use: Never used  Substance and Sexual Activity  . Alcohol use: No  . Drug use: No  . Sexual activity: Not on file  Other Topics Concern  . Not on file  Social History Narrative  . Not on file   Social Determinants of Health   Financial Resource Strain:   . Difficulty of Paying Living Expenses: Not on file  Food Insecurity:   . Worried About Charity fundraiser in the Last Year: Not on file  . Ran Out of Food in the Last Year: Not on file   Transportation Needs:   . Lack of Transportation (Medical): Not on file  . Lack of Transportation (Non-Medical): Not on file  Physical Activity:   . Days of Exercise per Week: Not on file  . Minutes of Exercise per Session: Not on file  Stress:   . Feeling of Stress : Not on file  Social Connections:   . Frequency of Communication with Friends and Family: Not on file  . Frequency of Social Gatherings with Friends and Family: Not on file  . Attends Religious Services: Not on file  . Active Member of Clubs or Organizations: Not on file  . Attends Archivist Meetings: Not on file  . Marital Status: Not on file  Intimate Partner Violence:   . Fear of Current or Ex-Partner: Not on file  . Emotionally Abused: Not on file  . Physically Abused: Not on file  . Sexually Abused: Not on file    Past Surgical Hx:  Past Surgical History:  Procedure Laterality Date  . COLONOSCOPY    . CYSTOSCOPY N/A 01/22/2020   Procedure: CYSTOSCOPY WITH SUPRA PUBIC TUBE CHANGE, BOTOX 200 UNITS, RETROGRADE PYELOGRAM BILATERAL. FULGERATION .5 CM-2CM;  Surgeon: Festus Aloe, MD;  Location: [facility];  Service: Urology;  Laterality: N/A;  . ESOPHAGOGASTRODUODENOSCOPY N/A 12/02/2017   Procedure: ESOPHAGOGASTRODUODENOSCOPY (EGD);  Surgeon: Ronnette Juniper, MD;  Location: [place];  Service: Gastroenterology;  Laterality: N/A;  . ROBOTIC ASSISTED TOTAL HYSTERECTOMY WITH BILATERAL SALPINGO OOPHERECTOMY Bilateral 12/19/2018   Procedure: XI ROBOTIC ASSISTED TOTAL HYSTERECTOMY (UTERUS GREATER THAN 250gr) WITH BILATERAL SALPINGO OOPHORECTOMY;  Surgeon: Everitt Amber, MD;  Location: [facility];  Service: Gynecology;  Laterality: Bilateral;  . SENTINEL NODE BIOPSY N/A 12/19/2018   Procedure: SENTINEL NODE BIOPSY;  Surgeon: Everitt Amber, MD;  Location: [facility];  Service: Gynecology;  Laterality: N/A;  . SUPRAPUBIC CATHETER INSERTION  2009  . TONSILLECTOMY    . TUBAL LIGATION      Past Medical Hx:  Past Medical  History:  Diagnosis Date  . Anemia    history of  . Arthritis   . Decubitus ulcers    currently treated at wound center  . Diabetes mellitus without complication ([place])   . Diabetic polyneuropathy ([place])   . Endometrial cancer (HCC)    Grade 2  . Fibroids   . GERD (gastroesophageal reflux disease)   . Heartburn    Occ  . Hypertension   . Mixed hyperlipidemia   . Morbid obesity ([place])   . MS (multiple sclerosis) ([place])     Past Gynecological History:  See HPI No LMP recorded. Patient has had a hysterectomy.  Family Hx:  Family History  Problem Relation Age of Onset  . Cancer Mother   . Hypertension Mother   . Heart disease Father   . Diabetes Father   . Hypertension Sister   . Hypertension Brother   . Breast cancer Neg Hx     Review of Systems:  Constitutional  Feels well,   ENT Normal appearing ears and nares bilaterally Skin/Breast  No rash, sores, jaundice, itching, dryness Cardiovascular  No chest pain, shortness of breath, or edema  Pulmonary  No cough or wheeze.  Gastro Intestinal  No nausea, vomitting, or diarrhoea. No bright red blood per rectum, no abdominal pain,+constipation.  Genito Urinary  No frequency, urgency, dysuria, no bleeding Musculo Skeletal  No myalgia, arthralgia, joint swelling or pain  Neurologic  + spastic paralysis lower extremities Psychology  No depression, anxiety, insomnia.   Vitals:  Blood pressure (!) 123/45, pulse 83, temperature 98.1 F (36.7 C), temperature source Tympanic, resp. rate 18, SpO2 100 %.  Physical Exam: WD in NAD Neck  Supple NROM, without any enlargements.  Lymph Node Survey No cervical supraclavicular or inguinal adenopathy Cardiovascular  Well perfused peripheries Lungs   No increased WOB Skin  Left posterior thigh breakdown - almost completely epithelialized, no cellulitis.  Psychiatry  Alert and oriented to person, place, and time  Abdomen  Normoactive bowel sounds, abdomen soft, non-tender  and obese without evidence of hernia. Suprapubic catheter sites present. Soft incisions. Back No CVA tenderness Genito Urinary  Vaginal smooth, no bleeding, no masses palpable. Rectal  deferred Extremities  No bilateral cyanosis, clubbing or edema.    Thereasa Solo, MD  10/15/2020, 4:08 PM

## 2020-10-15 NOTE — Patient Instructions (Signed)
Please notify Dr Denman George at phone number [phone] if you notice vaginal bleeding, new pelvic or abdominal pains, bloating, feeling full easy, or a change in bladder or bowel function.   Please have Dr Clabe Seal office contact Dr Serita Grit office (at [phone]) in January after your appointment with him to request an appointment with Dr Denman George for April, 2022.

## 2020-10-17 DIAGNOSIS — N318 Other neuromuscular dysfunction of bladder: Secondary | ICD-10-CM | POA: Diagnosis not present

## 2020-11-07 DIAGNOSIS — N318 Other neuromuscular dysfunction of bladder: Secondary | ICD-10-CM | POA: Diagnosis not present

## 2020-11-17 DIAGNOSIS — Z86011 Personal history of benign neoplasm of the brain: Secondary | ICD-10-CM | POA: Diagnosis not present

## 2020-11-17 DIAGNOSIS — I6200 Nontraumatic subdural hemorrhage, unspecified: Secondary | ICD-10-CM | POA: Diagnosis not present

## 2020-11-17 DIAGNOSIS — D329 Benign neoplasm of meninges, unspecified: Secondary | ICD-10-CM | POA: Diagnosis not present

## 2020-11-18 DIAGNOSIS — D329 Benign neoplasm of meninges, unspecified: Secondary | ICD-10-CM | POA: Diagnosis not present

## 2020-11-20 ENCOUNTER — Telehealth: Payer: Self-pay | Admitting: *Deleted

## 2020-11-20 NOTE — Telephone Encounter (Signed)
Called and scheduled the patient for a follow up appt in April with Dr Denman George

## 2020-12-03 DIAGNOSIS — N318 Other neuromuscular dysfunction of bladder: Secondary | ICD-10-CM | POA: Diagnosis not present

## 2020-12-08 ENCOUNTER — Ambulatory Visit
Admission: RE | Admit: 2020-12-08 | Discharge: 2020-12-08 | Disposition: A | Discharge status: Home / Self Care | Payer: Medicare Other | Source: Ambulatory Visit | Attending: Radiation Oncology | Admitting: Radiation Oncology

## 2020-12-08 ENCOUNTER — Other Ambulatory Visit: Payer: Self-pay

## 2020-12-08 DIAGNOSIS — C541 Malignant neoplasm of endometrium: Secondary | ICD-10-CM

## 2020-12-08 DIAGNOSIS — Z79899 Other long term (current) drug therapy: Secondary | ICD-10-CM | POA: Insufficient documentation

## 2020-12-08 DIAGNOSIS — Z8542 Personal history of malignant neoplasm of other parts of uterus: Secondary | ICD-10-CM | POA: Insufficient documentation

## 2020-12-08 DIAGNOSIS — Z923 Personal history of irradiation: Secondary | ICD-10-CM | POA: Diagnosis not present

## 2020-12-08 DIAGNOSIS — Z7984 Long term (current) use of oral hypoglycemic drugs: Secondary | ICD-10-CM | POA: Diagnosis not present

## 2020-12-08 NOTE — Progress Notes (Signed)
Patient here for a f/u visit with Dr.Kinard. Patient denies any pain or problems at this time. Patient seen in room 1 on the stretcher for her pelvic exam Dr. Roselind Messier assisted by Agustin Cree, NT and Valetta Mole NT and myself.  BP (!) 143/66 (BP Location: Left Wrist, Patient Position: Sitting, Cuff Size: Normal)   Pulse 75   Temp 98.2 F (36.8 C)   Resp 20   SpO2 98%   Wt Readings from Last 3 Encounters:  03/28/20 294 lb (133.4 kg)  02/26/20 300 lb (136.1 kg)  02/25/20 300 lb (136.1 kg)

## 2020-12-08 NOTE — Progress Notes (Signed)
Radiation Oncology         [phone] ________________________________  Name: [name] MRN: [id number]  Date: 12/08/2020  DOB: Feb 24, 1961  Follow-Up Visit Note  CC: Forrest Moron, MD  Forrest Moron, MD    ICD-10-CM   1. Endometrial cancer (HCC)  C54.1     Diagnosis:  Endometrial cancer grade 2, stage IB, pT1b, pN0(i+)  Interval Since Last Radiation: One year, eight months, two weeks, and six days  1. 02/14/2019 - 03/20/2019 (IMRT): Pelvis / 45 Gy in 25 fractions 2. 03/27/2019, 04/04/2019, 04/11/2019 (HDR, Brachy): Vaginal Cuff (cylinder, 3 cm diameter) / 18 Gy in 3 fractions (3 cm tx length)  Narrative:  The patient returns today for routine follow-up. Since her last visit, she underwent a left craniotomy for brain tumor resection and cortical mapping microsurgery grafting of autologous soft tissue at [facility] on 09/17/2020. The tumor was found to be benign. The patient tolerated the procedure well without any complications.  She was last seen by Dr. Denman George on 10/15/2020, during which time there were no signs of recurrence on physical exam.   On review of systems, she reports no new medical issues other than her brain tumor resection as above. She denies vaginal bleeding or pelvic pain.  ALLERGIES:  has No Known Allergies.  Meds: Current Outpatient Medications  Medication Sig Dispense Refill  . atorvastatin (LIPITOR) 10 MG tablet Take 10 mg by mouth at bedtime.     . baclofen (LIORESAL) 20 MG tablet Take one po tid plus two po at bedtime (Patient taking differently: Take 20-40 mg by mouth See admin instructions. Take 1 tablet (20 mg) by mouth 3 times daily & take 2 tablet (40 mg) by mouth at bedtime.) 150 tablet 11  . Catheters (DOVER UNIVERSAL FOLEY TRAY) KIT Use foley tray kit to change catheter every 3 weeks or as needed 15 each 1  . Catheters (FOLEY CATHETER 2-WAY) MISC Use to aspirate urine suprapubic 15 each 1  . Cholecalciferol (D3-1000) 1000 units capsule  Take 1,000 Units by mouth 2 (two) times daily. MORNING & AFTERNOON.    . clotrimazole (LOTRIMIN) 1 % cream Apply to both feet and between toes twice daily for 4 weeks. 60 g 1  . clotrimazole (LOTRIMIN) 1 % cream SMARTSIG:Topical Twice Daily    . furosemide (LASIX) 20 MG tablet TAKE 1 TABLET BY MOUTH ONCE DAILY (Patient taking differently: Take 20 mg by mouth daily. ) 90 tablet 3  . glipiZIDE (GLUCOTROL) 5 MG tablet Take 1 tablet (5 mg total) by mouth daily before lunch. (Patient taking differently: Take 5 mg by mouth daily. ) 90 tablet 3  . hydrochlorothiazide (HYDRODIURIL) 25 MG tablet TAKE 1 TABLET BY MOUTH ONCE DAILY EVERY MORNING 90 tablet 0  . hydrocortisone (ANUSOL-HC) 25 MG suppository Place 1 suppository (25 mg total) rectally 2 (two) times daily. (Patient taking differently: Place 25 mg rectally 2 (two) times daily as needed. ) 12 suppository 0  . hydrocortisone 2.5 % cream Place rectally.    . Incontinence Supply Disposable (TENA FLEX 16 PLUS) MISC Use daily as needed 30 each 11  . Incontinence Supply Disposable (TENA PROTECT UNDERWEAR PLS/XLG) MISC Use for incontinence. 30 each 11  . Interferon Beta-1b (BETASERON) 0.3 MG KIT injection Inject 0.3 mg into the skin every other day. (Patient taking differently: Inject 0.3 mg into the skin every other day. In the evening.) 36 each 3  . levETIRAcetam (KEPPRA) 1000 MG tablet Take 500 mg by mouth in  the morning and at bedtime.    . metFORMIN (GLUCOPHAGE) 500 MG tablet Take 500 mg by mouth 2 (two) times daily.  3  . MOVANTIK 25 MG TABS tablet Take 3 tablets (75 mg total) by mouth daily. (Patient taking differently: Take 75 mg by mouth every other day. In the morning) 90 tablet 4  . NON FORMULARY Power wheelchair repairs  Diagnosis code z99.3 1 each 0  . NON FORMULARY Dispense catheter urine drainage bags 90 Bag 3  . nystatin ointment (MYCOSTATIN) Apply to affected area twice daily as needed for irritation (Patient taking differently: Apply 1  application topically 2 (two) times daily as needed (irritation.). ) 30 g 6  . Ostomy Supplies (NEW IMAGE SKIN/FLANGE/TAPE) MISC 1 application by Does not apply route daily as needed. 30 each 11  . oxyCODONE-acetaminophen (PERCOCET) 10-325 MG tablet Take 1 tablet by mouth every 8 (eight) hours as needed for pain. 90 tablet 0  . potassium chloride (KLOR-CON) 10 MEQ tablet     . potassium chloride (MICRO-K) 10 MEQ CR capsule Take 30 mEq by mouth 2 (two) times daily.     . Syringe, Disposable, (B-D SYRINGE LUER-LOK 30CC) 30 ML MISC Use to change catheter 15 each 0  . vitamin B-12 (CYANOCOBALAMIN) 500 MCG tablet Take 500 mcg by mouth daily.     No current facility-administered medications for this encounter.   Facility-Administered Medications Ordered in Other Encounters  Medication Dose Route Frequency Provider Last Rate Last Admin  . botulinum toxin Type A (BOTOX) injection 200 Units  200 Units Intramuscular Once Festus Aloe, MD        Physical Findings: The patient is in no acute distress. Patient is alert and oriented.  temperature is 98.2 F (36.8 C). Her blood pressure is 143/66 (abnormal) and her pulse is 75. Her respiration is 20 and oxygen saturation is 98%. . On pelvic examination the external genitalia were unremarkable.  Exam was made possible with the assistance of three nursing personnel.  A speculum exam was performed.  No mucosal lesions noted in the vaginal vault.  On bimanual examination no pelvic masses appreciated.  Vaginal cuff intact.    Lab Findings: Lab Results  Component Value Date   WBC 6.7 01/17/2020   HGB 10.2 (L) 01/17/2020   HCT 34.3 (L) 01/17/2020   MCV 85.8 01/17/2020   PLT 446 (H) 01/17/2020    Radiographic Findings: No results found.  Impression: Endometrial cancer grade 2, stage IB, pT1b, pN0(i+)  No evidence of recurrence on clinical exam today.  She is physically unable to use her vaginal dilator.  Plan: The patient will follow-up with Dr.  Denman George in three months and with radiation oncology in six months.  Total time spent in this encounter was 25 minutes which included reviewing the patient's most recent brain surgery, follow-up with Dr. Denman George, physical examination, and documentation.  _________________________________   Blair Promise, PhD, MD  This document serves as a record of services personally performed by Gery Pray, MD. It was created on his behalf by Clerance Lav, a trained medical scribe. The creation of this record is based on the scribe's personal observations and the provider's statements to them. This document has been checked and approved by the attending provider.

## 2020-12-10 ENCOUNTER — Telehealth: Payer: Self-pay | Admitting: *Deleted

## 2020-12-10 NOTE — Telephone Encounter (Signed)
CALLED PATIENT TO INFORM OF FU APPT. FOR 06-11-21 @ 8:30 AM , PATIENT DECLINED APPT., INSISTED ON AFTERNOON APPT. APPT. SCHEDULED FOR 06-15-21 @ 3 PM, NOTIFIED DR. [facility] NURSE

## 2020-12-17 ENCOUNTER — Encounter: Payer: Self-pay | Admitting: Podiatry

## 2020-12-17 ENCOUNTER — Ambulatory Visit (INDEPENDENT_AMBULATORY_CARE_PROVIDER_SITE_OTHER): Payer: Medicare Other | Admitting: Podiatry

## 2020-12-17 ENCOUNTER — Other Ambulatory Visit: Payer: Self-pay

## 2020-12-17 DIAGNOSIS — S90811A Abrasion, right foot, initial encounter: Secondary | ICD-10-CM | POA: Diagnosis not present

## 2020-12-17 DIAGNOSIS — B351 Tinea unguium: Secondary | ICD-10-CM | POA: Diagnosis not present

## 2020-12-17 DIAGNOSIS — L84 Corns and callosities: Secondary | ICD-10-CM

## 2020-12-17 DIAGNOSIS — E1142 Type 2 diabetes mellitus with diabetic polyneuropathy: Secondary | ICD-10-CM

## 2020-12-17 DIAGNOSIS — G35 Multiple sclerosis: Secondary | ICD-10-CM | POA: Diagnosis not present

## 2020-12-17 DIAGNOSIS — D329 Benign neoplasm of meninges, unspecified: Secondary | ICD-10-CM | POA: Insufficient documentation

## 2020-12-21 NOTE — Progress Notes (Signed)
Subjective: [name] presents today for callus(es) plantar left foot and painful thick toenails that are difficult to trim. Painful toenails interfere with ambulation. Aggravating factors include wearing enclosed shoe gear. Pain is relieved with periodic professional debridement. Painful calluses are aggravated when weightbearing with and without shoegear. Pain is relieved with periodic professional debridement.   I pointed out she has a scab on one of her toes and she states she may have scraped it in the bathroom. She has no padding on her bathroom cabinets. Patient states she needs a new letter addressed to her apartment complex to repair rubber bumpers for her kitchen cabinets. She also needs rubber bumpers placed on her bathroom cabinets to avoid injuring her toes.  Patient did not check blood glucose this morning. States it was 140 mg/dl yesterday.   Forrest Moron, MD is patient's PCP. Last visit 08/13/2020.  Past Medical History:  Diagnosis Date  . Anemia    history of  . Arthritis   . Decubitus ulcers    currently treated at wound center  . Diabetes mellitus without complication ([place])   . Diabetic polyneuropathy ([place])   . Endometrial cancer ([facility])    Grade 2  . Fibroids   . GERD (gastroesophageal reflux disease)   . Heartburn    Occ  . Hypertension   . Mixed hyperlipidemia   . Morbid obesity ([place])   . MS (multiple sclerosis) ([place])     Patient Active Problem List   Diagnosis Date Noted  . Meningioma ([place]) 12/17/2020  . Nuclear sclerotic cataract of both eyes 01/23/2020  . Endometrial cancer ([place]) 11/21/2018  . Morbid obesity ([place]) 11/21/2018  . Pain management contract agreement 02/16/2017  . Mixed hyperlipidemia 11/24/2016  . Type 2 diabetes mellitus with diabetic polyneuropathy, without long-term current use of insulin ([place]) 11/24/2016  . Multiple sclerosis ([place]) 11/18/2016  . Leg weakness, bilateral 11/18/2016  . Dysesthesia 11/18/2016  . Diplegia of both  lower extremities ([place]) 11/18/2016  . Diabetes, polyneuropathy ([place]) 11/18/2016  . Chronic indwelling Foley catheter 11/17/2016  . Essential hypertension 11/17/2016  . Slow transit constipation 11/17/2016  . Chronically on opiate therapy 11/17/2016  . Physical deconditioning 11/17/2016    Past Surgical History:  Procedure Laterality Date  . COLONOSCOPY    . CYSTOSCOPY N/A 01/22/2020   Procedure: CYSTOSCOPY WITH SUPRA PUBIC TUBE CHANGE, BOTOX 200 UNITS, RETROGRADE PYELOGRAM BILATERAL. FULGERATION .5 CM-2CM;  Surgeon: Festus Aloe, MD;  Location: [facility];  Service: Urology;  Laterality: N/A;  . ESOPHAGOGASTRODUODENOSCOPY N/A 12/02/2017   Procedure: ESOPHAGOGASTRODUODENOSCOPY (EGD);  Surgeon: Ronnette Juniper, MD;  Location: [facility];  Service: Gastroenterology;  Laterality: N/A;  . ROBOTIC ASSISTED TOTAL HYSTERECTOMY WITH BILATERAL SALPINGO OOPHERECTOMY Bilateral 12/19/2018   Procedure: XI ROBOTIC ASSISTED TOTAL HYSTERECTOMY (UTERUS GREATER THAN 250gr) WITH BILATERAL SALPINGO OOPHORECTOMY;  Surgeon: Everitt Amber, MD;  Location: [facility];  Service: Gynecology;  Laterality: Bilateral;  . SENTINEL NODE BIOPSY N/A 12/19/2018   Procedure: SENTINEL NODE BIOPSY;  Surgeon: Everitt Amber, MD;  Location: [facility];  Service: Gynecology;  Laterality: N/A;  . SUPRAPUBIC CATHETER INSERTION  2009  . TONSILLECTOMY    . TUBAL LIGATION      Current Outpatient Medications on File Prior to Visit  Medication Sig Dispense Refill  . atorvastatin (LIPITOR) 10 MG tablet Take 10 mg by mouth at bedtime.     . baclofen (LIORESAL) 20 MG tablet Take one po tid plus two po at bedtime (Patient taking differently: Take 20-40 mg by mouth See admin  instructions. Take 1 tablet (20 mg) by mouth 3 times daily & take 2 tablet (40 mg) by mouth at bedtime.) 150 tablet 11  . Catheters (DOVER UNIVERSAL FOLEY TRAY) KIT Use foley tray kit to change catheter every 3 weeks or as needed 15 each 1  . Catheters (FOLEY CATHETER 2-WAY) MISC Use  to aspirate urine suprapubic 15 each 1  . cefUROXime (CEFTIN) 250 MG tablet Take 250 mg by mouth 2 (two) times daily.    . Cholecalciferol 25 MCG (1000 UT) capsule Take 1,000 Units by mouth 2 (two) times daily. MORNING & AFTERNOON.    . clotrimazole (LOTRIMIN) 1 % cream Apply to both feet and between toes twice daily for 4 weeks. 60 g 1  . clotrimazole (LOTRIMIN) 1 % cream SMARTSIG:Topical Twice Daily    . dexamethasone (DECADRON) 2 MG tablet Take by mouth.    . furosemide (LASIX) 20 MG tablet TAKE 1 TABLET BY MOUTH ONCE DAILY (Patient taking differently: Take 20 mg by mouth daily.) 90 tablet 3  . glipiZIDE (GLUCOTROL) 5 MG tablet Take 1 tablet (5 mg total) by mouth daily before lunch. (Patient taking differently: Take 5 mg by mouth daily.) 90 tablet 3  . hydrochlorothiazide (HYDRODIURIL) 25 MG tablet TAKE 1 TABLET BY MOUTH ONCE DAILY EVERY MORNING 90 tablet 0  . hydrocortisone (ANUSOL-HC) 25 MG suppository Place 1 suppository (25 mg total) rectally 2 (two) times daily. (Patient taking differently: Place 25 mg rectally 2 (two) times daily as needed.) 12 suppository 0  . hydrocortisone 2.5 % cream Place rectally.    . Incontinence Supply Disposable (TENA FLEX 16 PLUS) MISC Use daily as needed 30 each 11  . Incontinence Supply Disposable (TENA PROTECT UNDERWEAR PLS/XLG) MISC Use for incontinence. 30 each 11  . Interferon Beta-1b (BETASERON) 0.3 MG KIT injection Inject 0.3 mg into the skin every other day. (Patient taking differently: Inject 0.3 mg into the skin every other day. In the evening.) 36 each 3  . levETIRAcetam (KEPPRA) 1000 MG tablet Take 500 mg by mouth in the morning and at bedtime.    . metFORMIN (GLUCOPHAGE) 500 MG tablet Take 500 mg by mouth 2 (two) times daily.  3  . MOVANTIK 25 MG TABS tablet Take 3 tablets (75 mg total) by mouth daily. (Patient taking differently: Take 75 mg by mouth every other day. In the morning) 90 tablet 4  . NON FORMULARY Power wheelchair repairs  Diagnosis  code z99.3 1 each 0  . NON FORMULARY Dispense catheter urine drainage bags 90 Bag 3  . nystatin ointment (MYCOSTATIN) Apply to affected area twice daily as needed for irritation (Patient taking differently: Apply 1 application topically 2 (two) times daily as needed (irritation.).) 30 g 6  . Ostomy Supplies (NEW IMAGE SKIN/FLANGE/TAPE) MISC 1 application by Does not apply route daily as needed. 30 each 11  . oxyCODONE-acetaminophen (PERCOCET) 10-325 MG tablet Take 1 tablet by mouth every 8 (eight) hours as needed for pain. 90 tablet 0  . pantoprazole (PROTONIX) 40 MG tablet Take 40 mg by mouth daily.    . potassium chloride (KLOR-CON) 10 MEQ tablet     . potassium chloride (MICRO-K) 10 MEQ CR capsule Take 30 mEq by mouth 2 (two) times daily.     . Syringe, Disposable, (B-D SYRINGE LUER-LOK 30CC) 30 ML MISC Use to change catheter 15 each 0  . vitamin B-12 (CYANOCOBALAMIN) 500 MCG tablet Take 500 mcg by mouth daily.     Current Facility-Administered Medications on File  Prior to Visit  Medication Dose Route Frequency Provider Last Rate Last Admin  . botulinum toxin Type A (BOTOX) injection 200 Units  200 Units Intramuscular Once Festus Aloe, MD         No Known Allergies  Social History   Occupational History  . Not on file  Tobacco Use  . Smoking status: Never Smoker  . Smokeless tobacco: Never Used  Vaping Use  . Vaping Use: Never used  Substance and Sexual Activity  . Alcohol use: No  . Drug use: No  . Sexual activity: Not on file    Family History  Problem Relation Age of Onset  . Cancer Mother   . Hypertension Mother   . Heart disease Father   . Diabetes Father   . Hypertension Sister   . Hypertension Brother   . Breast cancer Neg Hx     Immunization History  Administered Date(s) Administered  . Tdap 01/09/2020     Objective: There were no vitals filed for this visit.  [name] is a pleasant 60 y.o. female in NAD. AAO X 3.  Vascular  Examination: Capillary refill time to digits immediate b/l. Palpable pedal pulses b/l LE. Pedal hair absent. Lower extremity skin temperature gradient within normal limits. Nonpitting edema noted b/l lower extremities, left foot and right foot.  Dermatological Examination: Pedal skin with normal turgor, texture and tone bilaterally. No open wounds bilaterally. No interdigital macerations bilaterally. Toenails 1-5 b/l elongated, discolored, dystrophic, thickened, crumbly with subungual debris and tenderness to dorsal palpation. Minimal hyperkeratotic lesion(s) submet head 5 left foot.  No erythema, no edema, no drainage, no flocculence.   Healing abrasion noted right 2nd toe. No erythema, no edema, no drainage, no fluctuance.  Musculoskeletal Examination: Normal muscle strength 5/5 to all lower extremity muscle groups bilaterally. No pain crepitus or joint limitation noted with ROM b/l. No gross bony deformities bilaterally. Utilizes motorized chair for mobility assistance.  Neurological Examination: Protective sensation diminished with 10g monofilament b/l. Vibratory sensation diminished b/l. Proprioception intact bilaterally.  Assessment: 1. Onychomycosis   2. Callus   3. Multiple sclerosis ([place])   4. Abrasion of right foot, initial encounter   5. Diabetic peripheral neuropathy associated with type 2 diabetes mellitus ([place])     Plan: -Examined patient. -Continue diabetic foot care principles. -Abrasion healing. Monitor for any changes. -We will send letter to her apartment complex management to repair rubber padding on kitchen cabinets and install rubber padding to bathroom cabinets as well. -Toenails 1-5 b/l were debrided in length and girth with sterile nail nippers and dremel without iatrogenic bleeding.  -Callus(es) submet head 5 left foot gently filed with sterile dremel tool.  -Patient to report any pedal injuries to medical professional immediately.  Return in about 3 months  (around 03/17/2021).  Marzetta Board, DPM

## 2020-12-23 ENCOUNTER — Encounter: Payer: Self-pay | Admitting: Podiatry

## 2020-12-23 NOTE — Patient Instructions (Signed)
Below is our plan:  We will continue current treatment plan. We will update labs today.   Please make sure you are staying well hydrated. I recommend 50-60 ounces daily. Well balanced diet and regular exercise encouraged.    Please continue follow up with care team as directed.   Follow up with Dr Felecia Shelling in 1 year   You may receive a survey regarding today's visit. I encourage you to leave honest feed back as I do use this information to improve patient care. Thank you for seeing me today!      Multiple Sclerosis Multiple sclerosis (MS) is a disease of the brain, spinal cord, and optic nerves (central nervous system). It causes the body's disease-fighting (immune) system to destroy the protective covering (myelin sheath) around nerves in the brain. When this happens, signals (nerve impulses) going to and from the brain and spinal cord do not get sent properly or may not get sent at all. There are several types of MS:  Relapsing-remitting MS. This is the most common type. This causes sudden attacks of symptoms. After an attack, you may recover completely until the next attack, or some symptoms may remain permanently.  Secondary progressive MS. This usually develops after the onset of relapsing-remitting MS. Similar to relapsing-remitting MS, this type also causes sudden attacks of symptoms. Attacks may be less frequent, but symptoms slowly get worse (progress) over time.  Primary progressive MS. This causes symptoms that steadily progress over time. This type of MS does not cause sudden attacks of symptoms. The age of onset of MS varies, but it often develops between 27-42 years of age. MS is a lifelong (chronic) condition. There is no cure, but treatment can help slow down the progression of the disease. What are the causes? The cause of this condition is not known. What increases the risk? You are more likely to develop this condition if:  You are a woman.  You have a relative with  MS. However, the condition is not passed from parent to child (inherited).  You have a lack (deficiency) of vitamin D.  You smoke. MS is more common in the [place] than in the [place]. What are the signs or symptoms? Relapsing-remitting and secondary progressive MS cause symptoms to occur in episodes or attacks that may last weeks to months. There may be long periods between attacks in which there are almost no symptoms. Primary progressive MS causes symptoms to steadily progress after they develop. Symptoms of MS vary because of the many different ways it affects the central nervous system. The main symptoms include:  Vision problems and eye pain.  Numbness and weakness.  Inability to move your arms, hands, feet, or legs (paralysis).  Balance problems.  Shaking that you cannot control (tremors).  Muscle spasms.  Problems with thinking (cognitive changes). MS can also cause symptoms that are associated with the disease, but are not always the direct result of an MS attack. They may include:  Inability to control urination or bowel movements (incontinence).  Headaches.  Fatigue.  Inability to tolerate heat.  Emotional changes.  Depression.  Pain. How is this diagnosed? This condition is diagnosed based on:  Your symptoms.  A neurological exam. This involves checking central nervous system function, such as nerve function, reflexes, and coordination.  MRIs of the brain and spinal cord.  Lab tests, including a lumbar puncture that tests the fluid that surrounds the brain and spinal cord (cerebrospinal fluid).  Tests to measure the  electrical activity of the brain in response to stimulation (evoked potentials). How is this treated? There is no cure for MS, but medicines can help decrease the number and frequency of attacks and help relieve nuisance symptoms. Treatment options may include:  Medicines that reduce the frequency of attacks.  These medicines may be given by injection, by mouth (orally), or through an IV.  Medicines that reduce inflammation (steroids). These may provide short-term relief of symptoms.  Medicines to help control pain, depression, fatigue, or incontinence.  Nutritional counseling. Vitamin D supplements, if you have a deficiency.  Using devices to help you move around (assistive devices), such as braces, a cane, or a walker.  Physical therapy to strengthen and stretch your muscles.  Occupational therapy to help you with everyday tasks.  Alternative or complementary treatments such as exercise, massage, or acupuncture.   Follow these instructions at home:  Take over-the-counter and prescription medicines only as told by your health care provider.  Do not drive or use heavy machinery while taking prescription pain medicine.  Use assistive devices as recommended by your physical therapist or your health care provider.  Exercise as directed by your health care provider.  Eating healthy can help manage MS symptoms.  Return to your normal activities as told by your health care provider. Ask your health care provider what activities are safe for you.  Reach out for support. Share your feelings with friends, family, or a support group.  Keep all follow-up visits as told by your health care provider and therapists. This is important. Where to find more information  National Multiple Sclerosis Society: https://www.nationalmssociety.Paint of Neurological Disorders and Stroke: http://hendricks-barton.net/  [employer]: GourmetRating.dk Contact a health care provider if:  You feel depressed.  You develop new pain or numbness.  You have tremors.  You have problems with sexual function. Get help right away if:  You develop paralysis.  You develop numbness.  You have problems with your bladder or bowel function.  You  develop double vision.  You lose vision in one or both eyes.  You develop suicidal thoughts.  You develop severe confusion. If you ever feel like you may hurt yourself or others, or have thoughts about taking your own life, get help right away. You can go to your nearest emergency department or call:  Your local emergency services (911 in the [place]  A suicide crisis helpline, such as the [place] at 262 252 1145. This is open 24 hours a day. Summary  Multiple sclerosis (MS) is a disease of the central nervous system that causes the body's immune system to destroy the protective covering (myelin sheath) around nerves in the brain.  There are 3 types of MS: relapsing-remitting, secondary progressive, and primary progressive. Relapsing-remitting and secondary progressive MS cause symptoms to occur in episodes or attacks that may last weeks to months. Primary progressive MS causes symptoms to steadily progress after they develop.  There is no cure for MS, but medicines can help decrease the number and frequency of attacks and help relieve nuisance symptoms. Treatment may also include physical or occupational therapy.  If you develop numbness, paralysis, vision problems, or other neurological symptoms, get help right away. This information is not intended to replace advice given to you by your health care provider. Make sure you discuss any questions you have with your health care provider. Document Revised: 09/02/2020 Document Reviewed: 09/02/2020 Elsevier Patient Education  2021 [employer].

## 2020-12-23 NOTE — Progress Notes (Signed)
Chief Complaint  Patient presents with   Follow-up    Rm 1 alone Pt is well     HISTORY OF PRESENT ILLNESS: Today 12/24/20  [name] is a 60 y.o. female here today for follow up for for relapsing/active SPMS. She continues Betaseron.   She reports that she is doing fairly well.  No new or exacerbating symptoms.  She did undergo craniotomy with tumor resection with [facility] in October.  She reports that she has recovered very well.  Dysesthesias and weakness of right upper extremity have resolved.  She does have some generalized aches and pains that are worse at night.  She feels it is hard for her to get comfortable at night.  She relates this to needing a new mattress.  She does use baclofen regularly and feels that this helps with spasticity.  She is wheelchair-bound.  She continues to use Bishop lift as needed.  No changes with bowel or bladder functioning.  She uses urinary catheter as needed.  She continues vitamin D supplementation over-the-counter, 5000 IUs daily.   HISTORY (copied from Dr Garth Bigness previous note)  [name] is a 60 y.o. woman with relapsing / active SPMS diagnosed in 2006.  Update 12/19/2019: She feels she is doing about the same as last year.     She is on Betaseron.   She feels strength is the same.   She needs a [employer] fast streamer to transfer.    Her daughters live with her and help her.    She has a little more muscle spasticity in her legs.    Legs do not completely lock up but become stiff.   She does not have pain related to this.   She denies numbness in her hands.     She has a suprapubic catheter and has no recent infections.    She feels vision is the same with sequela of left optic neuritis.     She notes some fatigue that is stable.  She has trouble getting comfortable at night.   Her head and feet are elevated at night in bed.    She denies depression or anxiety.    She denies cognitive issues.    She had a complete hysterectomy due to  endometrial cancer and has done well.   She did well during recovery.    Update 12/13/2018: She feels her MS is stable and she denies exacerbation.     She is now using the wheelchair exclusively.    She uses a bedpan.     She has help (daughter).    She has a lot of weakness and spasticity in her legs.    She is on baclofen with some benefit.   She has some tingling and numbness on the right side and the hand often feels funny.    She has a suprapubic catheter for urinary retention.   No infections recently.    She note reduced vision OS but colors are about the same left and right.   She is going to see ophthalmology after her surgery.      Mood is doing ok.    She is sleeping well most nights.   Cognition is doing well.    Fatigue is mild.       She was just diagnosed with uterine cancer (discovered during pap smear) and has surgery scheduled.    She was diagnosed in 2006 (Dr. Luberta Robertson in [facility]) and was on Copaxone (several exacerbations) and  then Betaseron.      Uptake 11/23/2017:    She feels her MS has been stable. She has been on Betaseron for many years. She had exacerbations on Copaxone. She is wheelchair-bound and has been for several years. She can transfer with assistance independently but has noted more difficulty doing that over the past few months. She does have spasticity in both legs, right equals left. Higher dose of baclofen did not help the spasticity any.  She notes visual blurring out of the left eye. Right eye visual acuity is fine. She has a suprapubic catheter for bladder dysfunction. She has had that since her large exacerbation in 2008. She notes some fatigue but has not been getting a full night's rest every night. She probably sleeps 4-5 hours most nights. She denies any depression or cognitive problems.   From 05/18/2017: MS:   She is on Betaseron. She had exacerbations while on Copaxone. She tolerates the Betaseron well and is not interested in switching to a  different medication was more than 10 years ago. She is claustrophobic and does not wish to have another MRI if possible.  Gait/strength/sensation: She has been wheelchair bound for several years and was using a walker since 2007 shortly after her diagnosis. She helps him with transfers cannot transfer independently. Her arms remained strong but she has weakness in both legs with spasticity. Muscle spasticity is worse at night but can occur during the day as well.   She takes baclofen 20 mg 3 times a day. She denies any numbness or tingling in the arms. She has a mild burning sensation in her feet. Lamotrigine did not help the dysesthetic sensation.   Bladder/bowel: She has a suprapubic catheter since 2009 due to urinary retention. She denies any bowel dysfunction.  She gets frequent UTI's     She recently started taking cranberries and drinks lots of fluids to try to prevent them.    Vision:  She notes some difficulty with her left eye and has been told by her ophthalmologist that it is due to a combination of MS and diabetes.    Fatigue/sleep: She denies any significant problems with fatigue. She sleeps well at night. She gets some spasms when she lays down but once she falls asleep she stays asleep.   Mood/cognition:  She feels her mood is good. She denies any depression or anxiety. She denies any difficulty with her memory or other cognitive skills.   MS History:   In 2006, she presented with tingling in her hands and right greater than left leg weakness.    She saw Dr. Luberta Robertson in [facility]. She was placed on Copaxone. Unfortunately,  she had a major exacerbation later that year and lost most of the use of her legs.  She was then placed on Betaseron and remains on Betaseron. She does not think she has had any exacerbations while she is on it. She tolerates it well and does not have any skin reactions.    Since 2007, she has been predominantly wheelchair bound strength has progressively  worsened and she went from using a walker for short distances to being completely wheelchair bound a few years ago.   Her last MRI was greater than 10 years ago.    REVIEW OF SYSTEMS: Out of a complete 14 system review of symptoms, the patient complains only of the following symptoms, spasticity, neurogenic bladder, lower extremity weakness, fatigue, chronic pain and all other reviewed systems are negative.   ALLERGIES: No  Known Allergies   HOME MEDICATIONS: Outpatient Medications Prior to Visit  Medication Sig Dispense Refill   atorvastatin (LIPITOR) 10 MG tablet Take 10 mg by mouth at bedtime.      baclofen (LIORESAL) 20 MG tablet Take one po tid plus two po at bedtime (Patient taking differently: Take 20-40 mg by mouth See admin instructions. Take 1 tablet (20 mg) by mouth 3 times daily & take 2 tablet (40 mg) by mouth at bedtime.) 150 tablet 11   Catheters (DOVER UNIVERSAL FOLEY TRAY) KIT Use foley tray kit to change catheter every 3 weeks or as needed 15 each 1   Catheters (FOLEY CATHETER 2-WAY) MISC Use to aspirate urine suprapubic 15 each 1   cholecalciferol (VITAMIN D3) 25 MCG (1000 UNIT) tablet Take 5,000 Units by mouth daily.     furosemide (LASIX) 20 MG tablet TAKE 1 TABLET BY MOUTH ONCE DAILY (Patient taking differently: Take 20 mg by mouth daily.) 90 tablet 3   glipiZIDE (GLUCOTROL) 5 MG tablet Take 1 tablet (5 mg total) by mouth daily before lunch. (Patient taking differently: Take 5 mg by mouth daily.) 90 tablet 3   hydrochlorothiazide (HYDRODIURIL) 25 MG tablet TAKE 1 TABLET BY MOUTH ONCE DAILY EVERY MORNING 90 tablet 0   hydrocortisone (ANUSOL-HC) 25 MG suppository Place 1 suppository (25 mg total) rectally 2 (two) times daily. (Patient taking differently: Place 25 mg rectally 2 (two) times daily as needed.) 12 suppository 0   hydrocortisone 2.5 % cream Place rectally.     Incontinence Supply Disposable (TENA FLEX 16 PLUS) MISC Use daily as needed 30 each 11    Incontinence Supply Disposable (TENA PROTECT UNDERWEAR PLS/XLG) MISC Use for incontinence. 30 each 11   Interferon Beta-1b (BETASERON) 0.3 MG KIT injection Inject 0.3 mg into the skin every other day. (Patient taking differently: Inject 0.3 mg into the skin every other day. In the evening.) 36 each 3   metFORMIN (GLUCOPHAGE) 500 MG tablet Take 500 mg by mouth 2 (two) times daily.  3   MOVANTIK 25 MG TABS tablet Take 3 tablets (75 mg total) by mouth daily. (Patient taking differently: Take 75 mg by mouth every other day. In the morning) 90 tablet 4   NON FORMULARY Power wheelchair repairs  Diagnosis code z99.3 1 each 0   NON FORMULARY Dispense catheter urine drainage bags 90 Bag 3   nystatin ointment (MYCOSTATIN) Apply to affected area twice daily as needed for irritation (Patient taking differently: Apply 1 application topically 2 (two) times daily as needed (irritation.).) 30 g 6   Ostomy Supplies (NEW IMAGE SKIN/FLANGE/TAPE) MISC 1 application by Does not apply route daily as needed. 30 each 11   oxyCODONE-acetaminophen (PERCOCET) 10-325 MG tablet Take 1 tablet by mouth every 8 (eight) hours as needed for pain. 90 tablet 0   potassium chloride (MICRO-K) 10 MEQ CR capsule Take 30 mEq by mouth 2 (two) times daily.      Syringe, Disposable, (B-D SYRINGE LUER-LOK 30CC) 30 ML MISC Use to change catheter 15 each 0   vitamin B-12 (CYANOCOBALAMIN) 500 MCG tablet Take 500 mcg by mouth daily.     Cholecalciferol 25 MCG (1000 UT) capsule Take 1,000 Units by mouth 2 (two) times daily. MORNING & AFTERNOON.     potassium chloride (KLOR-CON) 10 MEQ tablet      clotrimazole (LOTRIMIN) 1 % cream Apply to both feet and between toes twice daily for 4 weeks. (Patient not taking: Reported on 12/24/2020) 60 g 1  cefUROXime (CEFTIN) 250 MG tablet Take 250 mg by mouth 2 (two) times daily. (Patient not taking: Reported on 12/24/2020)     clotrimazole (LOTRIMIN) 1 % cream SMARTSIG:Topical Twice Daily  (Patient not taking: Reported on 12/24/2020)     dexamethasone (DECADRON) 2 MG tablet Take by mouth. (Patient not taking: Reported on 12/24/2020)     levETIRAcetam (KEPPRA) 1000 MG tablet Take 500 mg by mouth in the morning and at bedtime. (Patient not taking: Reported on 12/24/2020)     pantoprazole (PROTONIX) 40 MG tablet Take 40 mg by mouth daily. (Patient not taking: Reported on 12/24/2020)     botulinum toxin Type A (BOTOX) injection 200 Units      No facility-administered medications prior to visit.     PAST MEDICAL HISTORY: Past Medical History:  Diagnosis Date   Anemia    history of   Arthritis    Decubitus ulcers    currently treated at wound center   Diabetes mellitus without complication ([place])    Diabetic polyneuropathy ([place])    Endometrial cancer ([place])    Grade 2   Fibroids    GERD (gastroesophageal reflux disease)    Heartburn    Occ   Hypertension    Mixed hyperlipidemia    Morbid obesity ([place])    MS (multiple sclerosis) ([place])      PAST SURGICAL HISTORY: Past Surgical History:  Procedure Laterality Date   BRAIN TUMOR EXCISION  09/2020   COLONOSCOPY     CYSTOSCOPY N/A 01/22/2020   Procedure: CYSTOSCOPY WITH SUPRA PUBIC TUBE CHANGE, BOTOX 200 UNITS, RETROGRADE PYELOGRAM BILATERAL. FULGERATION .5 CM-2CM;  Surgeon: Festus Aloe, MD;  Location: [facility];  Service: Urology;  Laterality: N/A;   ESOPHAGOGASTRODUODENOSCOPY N/A 12/02/2017   Procedure: ESOPHAGOGASTRODUODENOSCOPY (EGD);  Surgeon: Ronnette Juniper, MD;  Location: [facility];  Service: Gastroenterology;  Laterality: N/A;   ROBOTIC ASSISTED TOTAL HYSTERECTOMY WITH BILATERAL SALPINGO OOPHERECTOMY Bilateral 12/19/2018   Procedure: XI ROBOTIC ASSISTED TOTAL HYSTERECTOMY (UTERUS GREATER THAN 250gr) WITH BILATERAL SALPINGO OOPHORECTOMY;  Surgeon: Everitt Amber, MD;  Location: [facility];  Service: Gynecology;  Laterality: Bilateral;   SENTINEL NODE BIOPSY N/A 12/19/2018   Procedure: SENTINEL NODE  BIOPSY;  Surgeon: Everitt Amber, MD;  Location: [facility];  Service: Gynecology;  Laterality: N/A;   SUPRAPUBIC CATHETER INSERTION  2009   TONSILLECTOMY     TUBAL LIGATION       FAMILY HISTORY: Family History  Problem Relation Age of Onset   Cancer Mother    Hypertension Mother    Heart disease Father    Diabetes Father    Hypertension Sister    Hypertension Brother    Breast cancer Neg Hx      SOCIAL HISTORY: Social History   Socioeconomic History   Marital status: Single    Spouse name: Not on file   Number of children: Not on file   Years of education: Not on file   Highest education level: Not on file  Occupational History   Not on file  Tobacco Use   Smoking status: Never Smoker   Smokeless tobacco: Never Used  Vaping Use   Vaping Use: Never used  Substance and Sexual Activity   Alcohol use: No   Drug use: No   Sexual activity: Not on file  Other Topics Concern   Not on file  Social History Narrative   Not on file   Social Determinants of Health   Financial Resource Strain: Not on file  Food Insecurity: Not on file  Transportation Needs: Not on file  Physical Activity: Not on file  Stress: Not on file  Social Connections: Not on file  Intimate Partner Violence: Not on file      PHYSICAL EXAM  Vitals:   12/24/20 1435 12/24/20 1439  BP:  (!) 156/84  Pulse:  80  Height: $Remove'5\' 7"'qGHhlfa$  (1.702 m)    Body mass index is 46.05 kg/m.   Generalized: Well developed, in no acute distress  Cardiology: normal rate and rhythm, no murmur auscultated  Respiratory: clear to auscultation bilaterally    Neurological examination  Mentation: Alert oriented to time, place, history taking. Follows all commands speech and language fluent Cranial nerve II-XII: Pupils were equal round reactive to light. Extraocular movements were full, visual field were full on confrontational test. Facial sensation and strength were normal. Head turning and shoulder  shrug  were normal and symmetric. Motor: The motor testing reveals 5 over 5 strength of bilateral upper extremities. 0/5 bilateral lower extremities.  Sensory: Sensory testing is intact to soft touch on all 4 extremities. No evidence of extinction is noted.  Coordination: Cerebellar testing reveals good finger-nose-finger bilaterally and unable to perform heel-to-shin bilaterally.  Gait and station: Patient is in motorized wheelchair today. Reflexes: Deep tendon reflexes are symmetric and normal bilaterally.     DIAGNOSTIC DATA (LABS, IMAGING, TESTING) - I reviewed patient records, labs, notes, testing and imaging myself where available.  Lab Results  Component Value Date   WBC 6.7 01/17/2020   HGB 10.2 (L) 01/17/2020   HCT 34.3 (L) 01/17/2020   MCV 85.8 01/17/2020   PLT 446 (H) 01/17/2020      Component Value Date/Time   NA 139 01/17/2020 1317   NA 141 12/19/2019 1500   K 3.4 (L) 01/17/2020 1317   CL 100 01/17/2020 1317   CO2 30 01/17/2020 1317   GLUCOSE 159 (H) 01/17/2020 1317   BUN 13 01/17/2020 1317   BUN 8 12/19/2019 1500   CREATININE 0.82 01/17/2020 1317   CALCIUM 9.0 01/17/2020 1317   PROT 6.9 12/19/2019 1500   ALBUMIN 4.2 12/19/2019 1500   AST 9 12/19/2019 1500   ALT 8 12/19/2019 1500   ALKPHOS 74 12/19/2019 1500   BILITOT <0.2 12/19/2019 1500   GFRNONAA >60 01/17/2020 1317   GFRAA >60 01/17/2020 1317   Lab Results  Component Value Date   CHOL 151 06/27/2019   HDL 62 06/27/2019   LDLCALC 72 06/27/2019   TRIG 83 06/27/2019   CHOLHDL 2.4 06/27/2019   Lab Results  Component Value Date   HGBA1C 6.9 (H) 12/19/2019   No results found for: [id number] Lab Results  Component Value Date   TSH 1.140 12/19/2019    No flowsheet data found.   ASSESSMENT AND PLAN  60 y.o. year old female  has a past medical history of Anemia, Arthritis, Decubitus ulcers, Diabetes mellitus without complication ([place]), Diabetic polyneuropathy ([place]), Endometrial cancer ([place]),  Fibroids, GERD (gastroesophageal reflux disease), Heartburn, Hypertension, Mixed hyperlipidemia, Morbid obesity ([place]), and MS (multiple sclerosis) ([place]). here with   Multiple sclerosis ([place]) - Plan: Vitamin D, 25-hydroxy, CMP, CBC with Differential/Platelets  Spastic diplegia (HCC)  Morbid obesity (HCC)  Vitamin D deficiency - Plan: Vitamin D, 25-hydroxy  [name] is doing fairly well, today.  I am very happy to hear that she recovered nicely following tumor resection with [facility] in October.  Dysesthesias and right upper extremity weakness have resolved.  We will continue Betaseron.  I will update labs today.  She will  continue baclofen as prescribed.  She will continue close follow-up with primary care and other care team members as directed.  Healthy lifestyle habits encouraged.  She will follow-up with Dr. Felecia Shelling in 1 year, sooner if needed.  She verbalizes understanding and agreement with this plan.  Orders Placed This Encounter  Procedures   Vitamin D, 25-hydroxy   CMP   CBC with Differential/Platelets      I spent 30 minutes of face-to-face and non-face-to-face time with patient.  This included previsit chart review, lab review, study review, order entry, electronic health record documentation, patient education.    Debbora Presto, MSN, FNP-C 12/24/2020, 4:13 PM  [facility] [address] [place], [place] [address] [phone]

## 2020-12-24 ENCOUNTER — Ambulatory Visit (INDEPENDENT_AMBULATORY_CARE_PROVIDER_SITE_OTHER): Payer: Medicare Other | Admitting: Family Medicine

## 2020-12-24 ENCOUNTER — Encounter: Payer: Self-pay | Admitting: Family Medicine

## 2020-12-24 VITALS — BP 156/84 | HR 80 | Ht 67.0 in

## 2020-12-24 DIAGNOSIS — G801 Spastic diplegic cerebral palsy: Secondary | ICD-10-CM

## 2020-12-24 DIAGNOSIS — G35 Multiple sclerosis: Secondary | ICD-10-CM

## 2020-12-24 DIAGNOSIS — E559 Vitamin D deficiency, unspecified: Secondary | ICD-10-CM | POA: Diagnosis not present

## 2020-12-24 NOTE — Progress Notes (Signed)
I have read the note, and I agree with the clinical assessment and plan.  Veralyn Lopp A. Randy Whitener, MD, PhD, FAAN Certified in Neurology, Clinical Neurophysiology, Sleep Medicine, Pain Medicine and Neuroimaging  [facility] [address], [address] [place], [place] [address] [phone]  

## 2020-12-25 ENCOUNTER — Telehealth: Payer: Self-pay | Admitting: *Deleted

## 2020-12-25 LAB — CBC WITH DIFFERENTIAL/PLATELET
Basophils Absolute: 0 10*3/uL (ref 0.0–0.2)
Basos: 1 %
EOS (ABSOLUTE): 0.3 10*3/uL (ref 0.0–0.4)
Eos: 4 %
Hematocrit: 34 % (ref 34.0–46.6)
Hemoglobin: 10.7 g/dL — ABNORMAL LOW (ref 11.1–15.9)
Immature Grans (Abs): 0 10*3/uL (ref 0.0–0.1)
Immature Granulocytes: 1 %
Lymphocytes Absolute: 1 10*3/uL (ref 0.7–3.1)
Lymphs: 15 %
MCH: 25.3 pg — ABNORMAL LOW (ref 26.6–33.0)
MCHC: 31.5 g/dL (ref 31.5–35.7)
MCV: 80 fL (ref 79–97)
Monocytes Absolute: 0.4 10*3/uL (ref 0.1–0.9)
Monocytes: 6 %
Neutrophils Absolute: 4.9 10*3/uL (ref 1.4–7.0)
Neutrophils: 73 %
Platelets: 487 10*3/uL — ABNORMAL HIGH (ref 150–450)
RBC: 4.23 x10E6/uL (ref 3.77–5.28)
RDW: 14.5 % (ref 11.7–15.4)
WBC: 6.6 10*3/uL (ref 3.4–10.8)

## 2020-12-25 LAB — COMPREHENSIVE METABOLIC PANEL
ALT: 12 IU/L (ref 0–32)
AST: 8 IU/L (ref 0–40)
Albumin/Globulin Ratio: 1.8 (ref 1.2–2.2)
Albumin: 4.6 g/dL (ref 3.8–4.9)
Alkaline Phosphatase: 94 IU/L (ref 44–121)
BUN/Creatinine Ratio: 12 (ref 12–28)
BUN: 12 mg/dL (ref 8–27)
Bilirubin Total: <0.2 mg/dL (ref 0.0–1.2)
CO2: 27 mmol/L (ref 20–29)
Calcium: 10 mg/dL (ref 8.7–10.3)
Chloride: 98 mmol/L (ref 96–106)
Creatinine, Ser: 1.03 mg/dL — ABNORMAL HIGH (ref 0.57–1.00)
GFR calc Af Amer: 68 mL/min/{1.73_m2} (ref >59)
GFR calc non Af Amer: 59 mL/min/{1.73_m2} — ABNORMAL LOW (ref >59)
Globulin, Total: 2.6 g/dL (ref 1.5–4.5)
Glucose: 208 mg/dL — ABNORMAL HIGH (ref 65–99)
Potassium: 4.1 mmol/L (ref 3.5–5.2)
Sodium: 141 mmol/L (ref 134–144)
Total Protein: 7.2 g/dL (ref 6.0–8.5)

## 2020-12-25 LAB — VITAMIN D 25 HYDROXY (VIT D DEFICIENCY, FRACTURES): Vit D, 25-Hydroxy: 33.6 ng/mL (ref 30.0–100.0)

## 2020-12-25 NOTE — Telephone Encounter (Signed)
-----   Message from Debbora Presto, NP sent at 12/25/2020 12:49 PM EST ----- Please let her know that, overall, her labs look ok. Vitamin D was on the low normal side. Have her continue vitamin D 5,000iu daily. Her glucose and creatinine were elevated but her PCP is monitoring. Follow up as scheduled.

## 2020-12-25 NOTE — Telephone Encounter (Signed)
I called pt and relayed that results of her labs to her per AL/NP.  She verbalized understanding.  I stated that her CR slight elevated along with her glucose.  Will forward to her pcp FYI.  She appreciated this.  Will keep hydrated.  Fax confirmation to her pcp Delia Chimes MD

## 2021-02-10 ENCOUNTER — Other Ambulatory Visit: Payer: Self-pay | Admitting: Neurology

## 2021-02-24 ENCOUNTER — Other Ambulatory Visit: Payer: Self-pay | Admitting: Family Medicine

## 2021-02-24 DIAGNOSIS — Z1231 Encounter for screening mammogram for malignant neoplasm of breast: Secondary | ICD-10-CM

## 2021-03-18 ENCOUNTER — Other Ambulatory Visit: Payer: Self-pay

## 2021-03-18 ENCOUNTER — Inpatient Hospital Stay: Payer: Medicare Other | Attending: Gynecologic Oncology | Admitting: Gynecologic Oncology

## 2021-03-18 VITALS — BP 130/80 | HR 80 | Resp 20 | Ht 67.0 in | Wt 289.0 lb

## 2021-03-18 DIAGNOSIS — Z90710 Acquired absence of both cervix and uterus: Secondary | ICD-10-CM | POA: Diagnosis not present

## 2021-03-18 DIAGNOSIS — Z993 Dependence on wheelchair: Secondary | ICD-10-CM | POA: Diagnosis not present

## 2021-03-18 DIAGNOSIS — C541 Malignant neoplasm of endometrium: Secondary | ICD-10-CM | POA: Insufficient documentation

## 2021-03-18 DIAGNOSIS — Z923 Personal history of irradiation: Secondary | ICD-10-CM | POA: Insufficient documentation

## 2021-03-18 DIAGNOSIS — G35 Multiple sclerosis: Secondary | ICD-10-CM | POA: Diagnosis not present

## 2021-03-18 DIAGNOSIS — E1142 Type 2 diabetes mellitus with diabetic polyneuropathy: Secondary | ICD-10-CM | POA: Diagnosis not present

## 2021-03-18 DIAGNOSIS — Z79899 Other long term (current) drug therapy: Secondary | ICD-10-CM | POA: Insufficient documentation

## 2021-03-18 DIAGNOSIS — Z90722 Acquired absence of ovaries, bilateral: Secondary | ICD-10-CM | POA: Diagnosis not present

## 2021-03-18 DIAGNOSIS — Z7984 Long term (current) use of oral hypoglycemic drugs: Secondary | ICD-10-CM | POA: Insufficient documentation

## 2021-03-18 NOTE — Progress Notes (Signed)
Follow-up Note: Gyn-Onc  Consult was initially requested by Dr. Mora Bellman for the evaluation of [name] 60 y.o. female  CC:  Chief Complaint  Patient presents with  . endometrial cancer    Assessment/Plan:  [name]  is a 60 y.o.  year old with MS (wheelchair bound) with a history of stage IB grade 2 endometrial cancer with positive ITC's in the SLN's and LVSI.  High/intermediate risk factors, s/p WPRT and vaginal brachytherapy completed May, 2020.  No evidence for recurrence on exam.  I recommend she follow-up at 6 monthly intervals for symptom review, physical examination and pelvic examination. Pap smear is not recommended in routine endometrial cancer surveillance. All questions were answered.  HPI: [name] is a 60 year old P1 who is seen in consultation at the request of Dr Elly Modena for grade 2 endometrial cancer.  The patient reports a history of vaginal bleeding for 6 months (since the summer of 2019).  Formed a transvaginal ultrasound scan on November 15, 2018 which revealed a uterus measuring 11.5 x 7.2 x 7.3 cm with a posterior lower uterine segment myoma measuring approximately 6 cm.  The endometrium was thickened to 34 mm.  The left and right ovaries were not clearly visualized.  An endometrial biopsy was then taken in the office which revealed FIGO grade 2 endometrioid adenocarcinoma.  The patient has a medical history significant for multiple sclerosis.  She is wheelchair-bound.  She has a suprapubic catheter.  She has spasms and no function in her lower extremities. She lives with her daughter.  She is independent through the day in her motorized wheelchair.  She requires Hoyer lift for transfers.    A suprapubic catheter was placed in 2009 and then later replaced.  Her neurologist is Dr. Felecia Shelling. Endometrial biopsy was performed on 11/10/18 which showed FIGO grade 2 endometrial cancer. [place] on November 15, 2018 showed a uterus measuring 11.5 x 7.2 x 7.3  cm with a 5 cm posterior leiomyoma in the mid to lower uterine segment.  There was a thickened endometrium at 34 mm.  The ovaries were not visualized.  There is no free fluid.  On 12/19/18 she underwent robotic assisted total hysterectomy BSO sentinel lymph node biopsy.  The uterus is greater than 250 g.  The procedure was complicated by morbid obesity and multiple sclerosis, paralysis.  Final pathology revealed a FIGO grade 2 endometrial cancer with outer half myometrial invasion (30 mm of 40 mm depth invasion close ).  LVSI was identified.  There was carcinoma involving the lower uterine segment.  The tumor size was 9.5 cm.  Ovaries and fallopian tubes and cervix were unremarkable.  There were no macro micrometastases and sentinel lymph nodes however isolated tumor cells were identified in the left obturator sentinel lymph node.  She was staged as FIGO stage Ib grade 2 adenocarcinoma of the endometrium with high risk features and recommendation for adjuvant radiation was made in accordance with NCCN guidelines.  She received adjuvant radiation for her high risk early stage endometrial cancer. Radiation treatment dates:   1. IMRT: 02/14/19-03/20/19                                                  2. HDR Ir-192: 03/27/19, 04/04/19, 04/11/19   Site/dose: 1. Pelvis; 25 fractions of 1.8 Gy for a total  of 45 Gy                    2. Vagina, 6 Gy in 3 fractions for a total dose of 18 Gy  Interval Hx:  Today she presents for scheduled follow up.  She had brain surgery in October, 2021 for a benign brain tumor. This was at [facility]. She did well from surgery with no complications.   She reports no symptoms concerning for recurrence.   Current Meds:   Current Outpatient Medications:  .  Adhesive Tape (PAPER TAPE 1"X10YD) TAPE, Apply to dressings as needed, Disp: 4 each, Rfl: 6 .  atorvastatin (LIPITOR) 10 MG tablet, Take 10 mg by mouth daily. , Disp: , Rfl:  .  baclofen (LIORESAL) 20 MG tablet,  TAKE 1 TABLET BY MOUTH FOUR TIMES A DAY**OFFICE VISIT NEEDED**, Disp: 120 tablet, Rfl: 0 .  Catheters (DOVER UNIVERSAL FOLEY TRAY) KIT, Use foley tray kit to change catheter every 3 weeks or as needed, Disp: 15 each, Rfl: 1 .  Catheters (FOLEY CATHETER 2-WAY) MISC, Use to aspirate urine suprapubic, Disp: 15 each, Rfl: 1 .  Cholecalciferol (D3-1000) 1000 units capsule, Take 2,000 Units by mouth daily. , Disp: , Rfl:  .  Control Gel Formula Dressing (DUODERM CGF DRESSING) MISC, Apply 1 each topically daily as needed., Disp: 5 each, Rfl: 11 .  cyanocobalamin 500 MCG tablet, Take 500 mcg by mouth daily., Disp: , Rfl:  .  Disposable Gloves MISC, Use disposable gloves to change catheter as needed, Disp: 200 each, Rfl: 11 .  furosemide (LASIX) 20 MG tablet, Take 1 tablet (20 mg total) by mouth daily., Disp: 90 tablet, Rfl: 0 .  glipiZIDE (GLUCOTROL) 5 MG tablet, Take 1 tablet (5 mg total) by mouth daily before lunch., Disp: 90 tablet, Rfl: 3 .  hydrochlorothiazide (HYDRODIURIL) 25 MG tablet, TAKE 1 TABLET BY MOUTH EVERY MORNING, Disp: 90 tablet, Rfl: 0 .  Incontinence Supply Disposable (TENA FLEX 16 PLUS) MISC, Use daily as needed, Disp: 30 each, Rfl: 11 .  Incontinence Supply Disposable (TENA PROTECT UNDERWEAR PLS/XLG) MISC, Use for incontinence., Disp: 30 each, Rfl: 11 .  Interferon Beta-1b (BETASERON) 0.3 MG KIT injection, Inject 0.3 mg into the skin every other day., Disp: 36 each, Rfl: 3 .  lidocaine (XYLOCAINE) 2 % jelly, USE when need with dressings, Disp: , Rfl: 1 .  metFORMIN (GLUCOPHAGE) 1000 MG tablet, Take 1,000 mg by mouth 2 (two) times daily with a meal., Disp: , Rfl:  .  Methenamine-Sodium Salicylate (AZO URINARY TRACT DEFENSE) 162-162.5 MG TABS, Take as instructed, Disp: , Rfl:  .  MOVANTIK 25 MG TABS tablet, Take 25 mg by mouth every morning., Disp: , Rfl: 4 .  NON FORMULARY, Power wheelchair repairs  Diagnosis code z99.3, Disp: 1 each, Rfl: 0 .  NON FORMULARY, Dispense catheter urine  drainage bags, Disp: 90 Bag, Rfl: 3 .  nystatin ointment (MYCOSTATIN), Apply to affected area twice daily as needed for irritation, Disp: 30 g, Rfl: 6 .  Ostomy Supplies (NEW IMAGE SKIN/FLANGE/TAPE) MISC, 1 application by Does not apply route daily as needed., Disp: 30 each, Rfl: 11 .  oxyCODONE (OXYCONTIN) 20 mg 12 hr tablet, Take 1 tablet (20 mg total) by mouth 3 (three) times daily as needed (pain)., Disp: 90 tablet, Rfl: 0 .  oxyCODONE-acetaminophen (PERCOCET) 10-325 MG tablet, Take 1 tablet by mouth every 8 (eight) hours as needed for pain. Needs office visit., Disp: 90 tablet, Rfl: 0 .  potassium chloride (K-DUR,KLOR-CON) 10 MEQ tablet, TAKE 3 TABLETS BY MOUTH TWICE DAILY**OFFICE VISIT NEEDED FOR REFILLS**, Disp: 180 tablet, Rfl: 0 .  Syringe, Disposable, (B-D SYRINGE LUER-LOK 30CC) 30 ML MISC, Use to change catheter, Disp: 15 each, Rfl: 0 .  Wound Dressings (GRX HYDROGEL GAUZE 4X4) PADS, Use gauze to clean wound daily as needed, Disp: 200 each, Rfl: 11   Allergy: No Known Allergies  Social Hx:   Social History   Socioeconomic History  . Marital status: Single    Spouse name: Not on file  . Number of children: Not on file  . Years of education: Not on file  . Highest education level: Not on file  Occupational History  . Not on file  Tobacco Use  . Smoking status: Never Smoker  . Smokeless tobacco: Never Used  Vaping Use  . Vaping Use: Never used  Substance and Sexual Activity  . Alcohol use: No  . Drug use: No  . Sexual activity: Not on file  Other Topics Concern  . Not on file  Social History Narrative  . Not on file   Social Determinants of Health   Financial Resource Strain: Not on file  Food Insecurity: Not on file  Transportation Needs: Not on file  Physical Activity: Not on file  Stress: Not on file  Social Connections: Not on file  Intimate Partner Violence: Not on file    Past Surgical Hx:  Past Surgical History:  Procedure Laterality Date  . BRAIN  TUMOR EXCISION  09/2020  . COLONOSCOPY    . CYSTOSCOPY N/A 01/22/2020   Procedure: CYSTOSCOPY WITH SUPRA PUBIC TUBE CHANGE, BOTOX 200 UNITS, RETROGRADE PYELOGRAM BILATERAL. FULGERATION .5 CM-2CM;  Surgeon: Jerilee Field, MD;  Location: [facility];  Service: Urology;  Laterality: N/A;  . ESOPHAGOGASTRODUODENOSCOPY N/A 12/02/2017   Procedure: ESOPHAGOGASTRODUODENOSCOPY (EGD);  Surgeon: Kerin Salen, MD;  Location: [facility]COPY;  Service: Gastroenterology;  Laterality: N/A;  . ROBOTIC ASSISTED TOTAL HYSTERECTOMY WITH BILATERAL SALPINGO OOPHERECTOMY Bilateral 12/19/2018   Procedure: XI ROBOTIC ASSISTED TOTAL HYSTERECTOMY (UTERUS GREATER THAN 250gr) WITH BILATERAL SALPINGO OOPHORECTOMY;  Surgeon: Adolphus Birchwood, MD;  Location: [facility];  Service: Gynecology;  Laterality: Bilateral;  . SENTINEL NODE BIOPSY N/A 12/19/2018   Procedure: SENTINEL NODE BIOPSY;  Surgeon: Adolphus Birchwood, MD;  Location: [facility];  Service: Gynecology;  Laterality: N/A;  . SUPRAPUBIC CATHETER INSERTION  2009  . TONSILLECTOMY    . TUBAL LIGATION      Past Medical Hx:  Past Medical History:  Diagnosis Date  . Anemia    history of  . Arthritis   . Decubitus ulcers    currently treated at wound center  . Diabetes mellitus without complication (HCC)   . Diabetic polyneuropathy (HCC)   . Endometrial cancer (HCC)    Grade 2  . Fibroids   . GERD (gastroesophageal reflux disease)   . Heartburn    Occ  . Hypertension   . Mixed hyperlipidemia   . Morbid obesity (HCC)   . MS (multiple sclerosis) (HCC)     Past Gynecological History:  See HPI No LMP recorded. Patient has had a hysterectomy.  Family Hx:  Family History  Problem Relation Age of Onset  . Cancer Mother   . Hypertension Mother   . Heart disease Father   . Diabetes Father   . Hypertension Sister   . Hypertension Brother   . Breast cancer Neg Hx     Review of Systems:  Constitutional  Feels well,  ENT Normal appearing ears and nares bilaterally Skin/Breast   No rash, sores, jaundice, itching, dryness Cardiovascular  No chest pain, shortness of breath, or edema  Pulmonary  No cough or wheeze.  Gastro Intestinal  No nausea, vomitting, or diarrhoea. No bright red blood per rectum, no abdominal pain,+constipation.  Genito Urinary  No frequency, urgency, dysuria, no bleeding Musculo Skeletal  No myalgia, arthralgia, joint swelling or pain  Neurologic  + spastic paralysis lower extremities Psychology  No depression, anxiety, insomnia.   Vitals:  Blood pressure 130/80, pulse 80, resp. rate 20, height $RemoveBe'5\' 7"'bIrHmItbs$  (1.702 m), weight 289 lb (131.1 kg), SpO2 100 %.  Physical Exam: WD in NAD Neck  Supple NROM, without any enlargements.  Lymph Node Survey No cervical supraclavicular or inguinal adenopathy Cardiovascular  Well perfused peripheries Lungs   No increased WOB Skin  Left posterior thigh breakdown - almost completely epithelialized, no cellulitis.  Psychiatry  Alert and oriented to person, place, and time  Abdomen  Normoactive bowel sounds, abdomen soft, non-tender and obese without evidence of hernia. Suprapubic catheter sites present. Soft incisions. Back No CVA tenderness Genito Urinary  Vaginal smooth, no bleeding, no masses palpable. Rectal  deferred Extremities  No bilateral cyanosis, clubbing or edema.    Thereasa Solo, MD  03/18/2021, 1:22 PM

## 2021-03-18 NOTE — Patient Instructions (Signed)
Please notify Dr Denman George at phone number [phone] if you notice vaginal bleeding, new pelvic or abdominal pains, bloating, feeling full easy, or a change in bladder or bowel function.   Please have Dr Clabe Seal office contact Dr Serita Grit office (at [phone]) in July after your appointment with him to request an appointment with Dr Denman George for December/January.

## 2021-04-04 ENCOUNTER — Emergency Department (HOSPITAL_COMMUNITY)
Admission: EM | Admit: 2021-04-04 | Discharge: 2021-04-04 | Disposition: A | Discharge status: Home / Self Care | Payer: Medicare Other | Attending: Emergency Medicine | Admitting: Emergency Medicine

## 2021-04-04 ENCOUNTER — Other Ambulatory Visit: Payer: Self-pay

## 2021-04-04 ENCOUNTER — Encounter (HOSPITAL_COMMUNITY): Payer: Self-pay

## 2021-04-04 DIAGNOSIS — Z8542 Personal history of malignant neoplasm of other parts of uterus: Secondary | ICD-10-CM | POA: Diagnosis not present

## 2021-04-04 DIAGNOSIS — Z7984 Long term (current) use of oral hypoglycemic drugs: Secondary | ICD-10-CM | POA: Diagnosis not present

## 2021-04-04 DIAGNOSIS — Z20822 Contact with and (suspected) exposure to covid-19: Secondary | ICD-10-CM | POA: Insufficient documentation

## 2021-04-04 DIAGNOSIS — E1142 Type 2 diabetes mellitus with diabetic polyneuropathy: Secondary | ICD-10-CM | POA: Insufficient documentation

## 2021-04-04 DIAGNOSIS — I10 Essential (primary) hypertension: Secondary | ICD-10-CM | POA: Insufficient documentation

## 2021-04-04 DIAGNOSIS — T783XXA Angioneurotic edema, initial encounter: Secondary | ICD-10-CM | POA: Diagnosis not present

## 2021-04-04 DIAGNOSIS — R220 Localized swelling, mass and lump, head: Secondary | ICD-10-CM | POA: Diagnosis present

## 2021-04-04 LAB — CBC WITH DIFFERENTIAL/PLATELET
Abs Immature Granulocytes: 0.02 10*3/uL (ref 0.00–0.07)
Basophils Absolute: 0 10*3/uL (ref 0.0–0.1)
Basophils Relative: 0 %
Eosinophils Absolute: 0.3 10*3/uL (ref 0.0–0.5)
Eosinophils Relative: 5 %
HCT: 34.5 % — ABNORMAL LOW (ref 36.0–46.0)
Hemoglobin: 10.1 g/dL — ABNORMAL LOW (ref 12.0–15.0)
Immature Granulocytes: 0 %
Lymphocytes Relative: 17 %
Lymphs Abs: 1.2 10*3/uL (ref 0.7–4.0)
MCH: 24.7 pg — ABNORMAL LOW (ref 26.0–34.0)
MCHC: 29.3 g/dL — ABNORMAL LOW (ref 30.0–36.0)
MCV: 84.4 fL (ref 80.0–100.0)
Monocytes Absolute: 0.5 10*3/uL (ref 0.1–1.0)
Monocytes Relative: 8 %
Neutro Abs: 4.8 10*3/uL (ref 1.7–7.7)
Neutrophils Relative %: 70 %
Platelets: 566 10*3/uL — ABNORMAL HIGH (ref 150–400)
RBC: 4.09 MIL/uL (ref 3.87–5.11)
RDW: 15.9 % — ABNORMAL HIGH (ref 11.5–15.5)
WBC: 6.9 10*3/uL (ref 4.0–10.5)
nRBC: 0 % (ref 0.0–0.2)

## 2021-04-04 LAB — RESP PANEL BY RT-PCR (FLU A&B, COVID) ARPGX2
Influenza A by PCR: NEGATIVE
Influenza B by PCR: NEGATIVE
SARS Coronavirus 2 by RT PCR: NEGATIVE

## 2021-04-04 LAB — BASIC METABOLIC PANEL
Anion gap: 13 (ref 5–15)
BUN: 21 mg/dL (ref 8–23)
CO2: 26 mmol/L (ref 22–32)
Calcium: 9.3 mg/dL (ref 8.9–10.3)
Chloride: 97 mmol/L — ABNORMAL LOW (ref 98–111)
Creatinine, Ser: 0.93 mg/dL (ref 0.44–1.00)
GFR, Estimated: >60 mL/min (ref >60)
Glucose, Bld: 163 mg/dL — ABNORMAL HIGH (ref 70–99)
Potassium: 3.4 mmol/L — ABNORMAL LOW (ref 3.5–5.1)
Sodium: 136 mmol/L (ref 135–145)

## 2021-04-04 MED ORDER — FAMOTIDINE IN NACL 20-0.9 MG/50ML-% IV SOLN
20.0000 mg | Freq: Once | INTRAVENOUS | Status: AC
  Administered 2021-04-04: 20 mg via INTRAVENOUS
  Filled 2021-04-04: qty 50

## 2021-04-04 MED ORDER — DIPHENHYDRAMINE HCL 50 MG/ML IJ SOLN
50.0000 mg | Freq: Once | INTRAMUSCULAR | Status: AC
  Administered 2021-04-04: 50 mg via INTRAVENOUS
  Filled 2021-04-04: qty 1

## 2021-04-04 MED ORDER — DEXAMETHASONE SODIUM PHOSPHATE 10 MG/ML IJ SOLN
10.0000 mg | Freq: Once | INTRAMUSCULAR | Status: AC
  Administered 2021-04-04: 10 mg via INTRAVENOUS
  Filled 2021-04-04: qty 1

## 2021-04-04 MED ORDER — PREDNISONE 20 MG PO TABS: 40.0000 mg | ORAL_TABLET | Freq: Every day | ORAL | 0 refills | Status: AC

## 2021-04-04 NOTE — ED Triage Notes (Signed)
BIBA medic c/o tongue swelling that woke her from sleep this morning at 0630. Denies taking any medications or eating prior to this. Denies any known allergies. Speech garbled. Denies any respiratory distress.

## 2021-04-04 NOTE — Discharge Instructions (Signed)
Take prednisone as prescribed, starting tomorrow. Return to the emergency room immediately if you have any worsening swelling, difficulty breathing, or difficulty swallowing. Return to the emergency room with any new, worsening, concerning symptoms.

## 2021-04-04 NOTE — ED Provider Notes (Signed)
[facility] DEPT Provider Note   CSN: [id number] Arrival date & time: 04/04/21  2197     History Chief Complaint  Patient presents with  . Oral Swelling    [name] is a 60 y.o. female presenting for evaluation of tongue swelling.  Patient states she was awoken from sleep with a feeling of tongue swelling.  She has significant sublingual swelling which has been persistent since waking up at 630 this morning.  She has not taken anything for this.  No change in medication, new foods or exposures.  No known allergies.  No history of similar.  She states her voice sounds different than normal.  She denies rash, itching, difficulty swallowing, feel like she cannot get air in her lungs, nausea, vomiting, abdominal pain.  She has never had similar symptoms.  She is not on an ACE inhibitor.  Additional history obtained from chart review.  Patient with a history of multiple sclerosis, endometrial cancer status post treatment, diabetes, hyperlipidemia, hypertension, chronic Foley cath  HPI     Past Medical History:  Diagnosis Date  . Anemia    history of  . Arthritis   . Decubitus ulcers    currently treated at wound center  . Diabetes mellitus without complication ([place])   . Diabetic polyneuropathy ([place])   . Endometrial cancer (HCC)    Grade 2  . Fibroids   . GERD (gastroesophageal reflux disease)   . Heartburn    Occ  . Hypertension   . Mixed hyperlipidemia   . Morbid obesity ([place])   . MS (multiple sclerosis) ([place])     Patient Active Problem List   Diagnosis Date Noted  . Meningioma ([place]) 12/17/2020  . Nuclear sclerotic cataract of both eyes 01/23/2020  . Endometrial cancer ([place]) 11/21/2018  . Morbid obesity ([place]) 11/21/2018  . Pain management contract agreement 02/16/2017  . Mixed hyperlipidemia 11/24/2016  . Type 2 diabetes mellitus with diabetic polyneuropathy, without long-term current use of insulin ([place]) 11/24/2016  . Multiple  sclerosis ([place]) 11/18/2016  . Leg weakness, bilateral 11/18/2016  . Dysesthesia 11/18/2016  . Diplegia of both lower extremities ([place]) 11/18/2016  . Diabetes, polyneuropathy ([place]) 11/18/2016  . Chronic indwelling Foley catheter 11/17/2016  . Essential hypertension 11/17/2016  . Slow transit constipation 11/17/2016  . Chronically on opiate therapy 11/17/2016  . Physical deconditioning 11/17/2016    Past Surgical History:  Procedure Laterality Date  . BRAIN TUMOR EXCISION  09/2020  . COLONOSCOPY    . CYSTOSCOPY N/A 01/22/2020   Procedure: CYSTOSCOPY WITH SUPRA PUBIC TUBE CHANGE, BOTOX 200 UNITS, RETROGRADE PYELOGRAM BILATERAL. FULGERATION .5 CM-2CM;  Surgeon: Festus Aloe, MD;  Location: [facility];  Service: Urology;  Laterality: N/A;  . ESOPHAGOGASTRODUODENOSCOPY N/A 12/02/2017   Procedure: ESOPHAGOGASTRODUODENOSCOPY (EGD);  Surgeon: Ronnette Juniper, MD;  Location: [facility];  Service: Gastroenterology;  Laterality: N/A;  . ROBOTIC ASSISTED TOTAL HYSTERECTOMY WITH BILATERAL SALPINGO OOPHERECTOMY Bilateral 12/19/2018   Procedure: XI ROBOTIC ASSISTED TOTAL HYSTERECTOMY (UTERUS GREATER THAN 250gr) WITH BILATERAL SALPINGO OOPHORECTOMY;  Surgeon: Everitt Amber, MD;  Location: [facility];  Service: Gynecology;  Laterality: Bilateral;  . SENTINEL NODE BIOPSY N/A 12/19/2018   Procedure: SENTINEL NODE BIOPSY;  Surgeon: Everitt Amber, MD;  Location: [facility];  Service: Gynecology;  Laterality: N/A;  . SUPRAPUBIC CATHETER INSERTION  2009  . TONSILLECTOMY    . TUBAL LIGATION       OB History   No obstetric history on file.     Family History  Problem  Relation Age of Onset  . Cancer Mother   . Hypertension Mother   . Heart disease Father   . Diabetes Father   . Hypertension Sister   . Hypertension Brother   . Breast cancer Neg Hx     Social History   Tobacco Use  . Smoking status: Never Smoker  . Smokeless tobacco: Never Used  Vaping Use  . Vaping Use: Never used  Substance Use Topics  .  Alcohol use: No  . Drug use: No    Home Medications Prior to Admission medications   Medication Sig Start Date End Date Taking? Authorizing Provider  predniSONE (DELTASONE) 20 MG tablet Take 2 tablets (40 mg total) by mouth daily for 4 days. 04/05/21 04/09/21 Yes Desiray Orchard, PA-C  atorvastatin (LIPITOR) 10 MG tablet Take 10 mg by mouth at bedtime.     [provider]  baclofen (LIORESAL) 20 MG tablet TAKE ONE TABLET BY MOUTH THREE TIMES DAILY AND TAKE TWO TABLET BY MOUTH AT BEDTIME 02/11/21   Lomax, Amy, NP  Catheters (DOVER UNIVERSAL FOLEY TRAY) KIT Use foley tray kit to change catheter every 3 weeks or as needed 12/15/16   Delia Chimes A, MD  Catheters (FOLEY CATHETER 2-WAY) MISC Use to aspirate urine suprapubic 12/15/16   Delia Chimes A, MD  cholecalciferol (VITAMIN D3) 25 MCG (1000 UNIT) tablet Take 5,000 Units by mouth daily.    [provider]  clotrimazole (LOTRIMIN) 1 % cream Apply to both feet and between toes twice daily for 4 weeks. Patient not taking: Reported on 12/24/2020 08/28/20   Marzetta Board, DPM  furosemide (LASIX) 20 MG tablet TAKE 1 TABLET BY MOUTH ONCE DAILY Patient taking differently: Take 20 mg by mouth daily. 10/20/19   Forrest Moron, MD  glipiZIDE (GLUCOTROL) 5 MG tablet Take 1 tablet (5 mg total) by mouth daily before lunch. Patient taking differently: Take 5 mg by mouth daily. 05/11/17   Forrest Moron, MD  hydrochlorothiazide (HYDRODIURIL) 25 MG tablet TAKE 1 TABLET BY MOUTH ONCE DAILY EVERY MORNING 04/21/20   Forrest Moron, MD  hydrocortisone (ANUSOL-HC) 25 MG suppository Place 1 suppository (25 mg total) rectally 2 (two) times daily. Patient taking differently: Place 25 mg rectally 2 (two) times daily as needed. 02/27/19   Gery Pray, MD  hydrocortisone 2.5 % cream Place rectally. 02/20/19   [provider]  Incontinence Supply Disposable (TENA FLEX 16 PLUS) MISC Use daily as needed 07/19/18   Forrest Moron, MD   Incontinence Supply Disposable (TENA PROTECT UNDERWEAR PLS/XLG) MISC Use for incontinence. 07/19/18   Forrest Moron, MD  Interferon Beta-1b (BETASERON) 0.3 MG KIT injection Inject 0.3 mg into the skin every other day. Patient taking differently: Inject 0.3 mg into the skin every other day. In the evening. 12/19/19   Sater, Nanine Means, MD  metFORMIN (GLUCOPHAGE) 500 MG tablet Take 500 mg by mouth 2 (two) times daily. 10/17/18   [provider]  MOVANTIK 25 MG TABS tablet Take 3 tablets (75 mg total) by mouth daily. Patient taking differently: Take 75 mg by mouth every other day. In the morning 01/09/20   Forrest Moron, MD  NON FORMULARY Power wheelchair repairs  Diagnosis code z99.3 04/13/17   Forrest Moron, MD  NON FORMULARY Dispense catheter urine drainage bags 07/19/18   Delia Chimes A, MD  nystatin ointment (MYCOSTATIN) Apply to affected area twice daily as needed for irritation Patient taking differently: Apply 1 application topically 2 (two)  times daily as needed (irritation.). 01/04/18   Forrest Moron, MD  Ostomy Supplies (NEW IMAGE SKIN/FLANGE/TAPE) MISC 1 application by Does not apply route daily as needed. 07/19/18   Forrest Moron, MD  oxyCODONE-acetaminophen (PERCOCET) 10-325 MG tablet Take 1 tablet by mouth every 8 (eight) hours as needed for pain. 05/15/20   Forrest Moron, MD  potassium chloride (MICRO-K) 10 MEQ CR capsule Take 30 mEq by mouth 2 (two) times daily.  01/07/20   [provider]  Syringe, Disposable, (B-D SYRINGE LUER-LOK 30CC) 30 ML MISC Use to change catheter 12/15/16   Forrest Moron, MD  vitamin B-12 (CYANOCOBALAMIN) 500 MCG tablet Take 500 mcg by mouth daily.    [provider]    Allergies    Patient has no known allergies.  Review of Systems   Review of Systems  HENT:       Sublingual swelling  All other systems reviewed and are negative.   Physical Exam Updated Vital Signs BP 133/73   Pulse 73   Temp 98.3 F (36.8  C)   Resp 18   Ht $R'5\' 7"'uj$  (1.702 m)   Wt 131.1 kg   SpO2 100%   BMI 45.26 kg/m   Physical Exam Vitals and nursing note reviewed.  Constitutional:      General: She is not in acute distress.    Appearance: She is well-developed. She is obese.     Comments: nontoxic  HENT:     Head: Normocephalic and atraumatic.     Comments: Significant sublingual swelling. No swelling of the lips or throat. Handling secretions easily. Slightly muffled phonation. No dental pain. No ttp sublingual  Eyes:     Conjunctiva/sclera: Conjunctivae normal.     Pupils: Pupils are equal, round, and reactive to light.  Cardiovascular:     Rate and Rhythm: Normal rate and regular rhythm.     Pulses: Normal pulses.  Pulmonary:     Effort: Pulmonary effort is normal. No respiratory distress.     Breath sounds: Normal breath sounds. No wheezing.     Comments: Clear lung sounds Abdominal:     General: There is no distension.     Palpations: Abdomen is soft. There is no mass.     Tenderness: There is no abdominal tenderness. There is no guarding or rebound.  Musculoskeletal:        General: Normal range of motion.     Cervical back: Normal range of motion and neck supple.  Skin:    General: Skin is warm and dry.  Neurological:     Mental Status: She is alert and oriented to person, place, and time.     ED Results / Procedures / Treatments   Labs (all labs ordered are listed, but only abnormal results are displayed) Labs Reviewed  CBC WITH DIFFERENTIAL/PLATELET - Abnormal; Notable for the following components:      Result Value   Hemoglobin 10.1 (*)    HCT 34.5 (*)    MCH 24.7 (*)    MCHC 29.3 (*)    RDW 15.9 (*)    Platelets 566 (*)    All other components within normal limits  BASIC METABOLIC PANEL - Abnormal; Notable for the following components:   Potassium 3.4 (*)    Chloride 97 (*)    Glucose, Bld 163 (*)    All other components within normal limits  RESP PANEL BY RT-PCR (FLU A&B, COVID)  ARPGX2    EKG None  Radiology  No results found.  Procedures Procedures   Medications Ordered in ED Medications  diphenhydrAMINE (BENADRYL) injection 50 mg (50 mg Intravenous Given 04/04/21 0809)  dexamethasone (DECADRON) injection 10 mg (10 mg Intravenous Given 04/04/21 0809)  famotidine (PEPCID) IVPB 20 mg premix (0 mg Intravenous Stopped 04/04/21 0839)    ED Course  I have reviewed the triage vital signs and the nursing notes.  Pertinent labs & imaging results that were available during my care of the patient were reviewed by me and considered in my medical decision making (see chart for details).    MDM Rules/Calculators/A&P                          Patient presented for evaluation of tongue swelling.  On exam, patient has significant sublingual swelling.  Voice is slightly muffled.  However no difficulty handling her own saliva.  No obvious identifiable cause of the angioedema.  Will treat with Decadron, Benadryl, Pepcid and reassess.  On reassessment, patient reports improvement.  Clinically she does have some improvement continues to have swelling.  After approximately 4-hour observation, patient has significant improvement of the swelling, but is still present.  Patient reports she is feeling much better.  She states her speech is back to normal.  She is requesting to go home.  I discussed that she continues to have swelling and unknown cause for angioedema, we recommend admission/observation for at least 24 hours.  Patient declines.  I discussed risks of leaving including recurrent swelling and airway obstruction that could result in death.  Patient states he understands, but still wants to leave.  I encourage prompt return to the ER with any worsening symptoms.  Patient is of sound mind and able to make her own medical decisions at this point.  Will discharge with prednisone and have patient follow-up closely with primary care doctor.  Final Clinical Impression(s) / ED  Diagnoses Final diagnoses:  Angioedema, initial encounter    Rx / DC Orders ED Discharge Orders         Ordered    predniSONE (DELTASONE) 20 MG tablet  Daily        04/04/21 1136           Franchot Heidelberg, PA-C 04/04/21 1216    Valarie Merino, MD 04/04/21 [phone]

## 2021-04-04 NOTE — ED Notes (Signed)
Provided sandwich and fluids; pt swallowing without difficulty. [facility] contacted to transport pt back home.

## 2021-04-17 ENCOUNTER — Ambulatory Visit: Payer: Medicare Other

## 2021-04-21 ENCOUNTER — Ambulatory Visit
Admission: RE | Admit: 2021-04-21 | Discharge: 2021-04-21 | Disposition: A | Discharge status: Home / Self Care | Payer: Medicare Other | Source: Ambulatory Visit | Attending: Family Medicine | Admitting: Family Medicine

## 2021-04-21 ENCOUNTER — Other Ambulatory Visit: Payer: Self-pay

## 2021-04-21 DIAGNOSIS — Z1231 Encounter for screening mammogram for malignant neoplasm of breast: Secondary | ICD-10-CM

## 2021-04-22 ENCOUNTER — Ambulatory Visit (INDEPENDENT_AMBULATORY_CARE_PROVIDER_SITE_OTHER): Payer: Medicare Other | Admitting: Podiatry

## 2021-04-22 ENCOUNTER — Encounter: Payer: Self-pay | Admitting: Podiatry

## 2021-04-22 ENCOUNTER — Other Ambulatory Visit: Payer: Self-pay | Admitting: Family Medicine

## 2021-04-22 DIAGNOSIS — R928 Other abnormal and inconclusive findings on diagnostic imaging of breast: Secondary | ICD-10-CM

## 2021-04-22 DIAGNOSIS — B351 Tinea unguium: Secondary | ICD-10-CM

## 2021-04-22 DIAGNOSIS — E1142 Type 2 diabetes mellitus with diabetic polyneuropathy: Secondary | ICD-10-CM

## 2021-04-22 NOTE — Patient Instructions (Signed)
Apply Tea Tree Oil to afftected toenails once daily.

## 2021-04-26 NOTE — Progress Notes (Signed)
  Subjective:  Patient ID: [name], female    DOB: 1961/02/08,  MRN: [id number]  [name] presents to clinic today for at risk foot care with history of diabetic neuropathy and painful thick toenails that are difficult to trim. Pain interferes with ambulation. Aggravating factors include wearing enclosed shoe gear. Pain is relieved with periodic professional debridement.   She voices no new pedal problems on today's visit.   Her PCP is Dr. Delia Chimes.  No Known Allergies  Review of Systems: Negative except as noted in the HPI. Objective:   Constitutional [name] is a pleasant 60 y.o. African American female, morbidly obese in NAD. AAO x 3.   Vascular Capillary refill time to digits immediate b/l. Palpable pedal pulses b/l LE. Pedal hair absent. Lower extremity skin temperature gradient within normal limits. Nonpitting edema noted b/l lower extremities. No cyanosis or clubbing noted.  Neurologic Normal speech. Oriented to person, place, and time. Protective sensation diminished with 10g monofilament b/l.  Dermatologic Pedal skin with normal turgor, texture and tone bilaterally. No open wounds bilaterally. No interdigital macerations bilaterally. Toenails 1-5 b/l elongated, discolored, dystrophic, thickened, crumbly with subungual debris and tenderness to dorsal palpation. No hyperkeratotic nor porokeratotic lesions present on today's visit.  Orthopedic: Normal muscle strength 5/5 to all lower extremity muscle groups bilaterally. No pain crepitus or joint limitation noted with ROM b/l. No gross bony deformities bilaterally. Utilizes motorized chair for mobility assistance.   Radiographs: None Assessment:   1. Onychomycosis   2. Diabetic peripheral neuropathy associated with type 2 diabetes mellitus ([place])    Plan:  Patient was evaluated and treated and all questions answered.  Onychomycosis with pain -Nails palliatively debridement as below -Educated on self-care  Procedure:  Nail Debridement Rationale: Pain Type of Debridement: manual, sharp debridement. Instrumentation: Nail nipper, rotary burr. Number of Nails: 10 -Examined patient. -No new findings. No new orders. -Continue diabetic foot care principles. -Patient to continue soft, supportive shoe gear daily. -Discussed treatment options for onychomycosis. Patient opted for topical OTC therapy. Patient is to apply 1 drop of tea tree oil to affected toenail(s) once daily. -Toenails 1-5 b/l were debrided in length and girth with sterile nail nippers and dremel without iatrogenic bleeding.  -Patient to report any pedal injuries to medical professional immediately. -Patient/POA to call should there be question/concern in the interim.  Return in about 3 months (around 07/23/2021).  Marzetta Board, DPM

## 2021-04-29 ENCOUNTER — Other Ambulatory Visit: Payer: Medicare Other

## 2021-05-11 ENCOUNTER — Other Ambulatory Visit: Payer: Medicare Other

## 2021-05-15 ENCOUNTER — Other Ambulatory Visit: Payer: Medicare Other

## 2021-05-18 ENCOUNTER — Other Ambulatory Visit: Payer: Medicare Other

## 2021-06-03 ENCOUNTER — Ambulatory Visit
Admission: RE | Admit: 2021-06-03 | Discharge: 2021-06-03 | Disposition: A | Discharge status: Home / Self Care | Payer: Medicare Other | Source: Ambulatory Visit | Attending: Family Medicine | Admitting: Family Medicine

## 2021-06-03 ENCOUNTER — Other Ambulatory Visit: Payer: Self-pay

## 2021-06-03 DIAGNOSIS — R928 Other abnormal and inconclusive findings on diagnostic imaging of breast: Secondary | ICD-10-CM

## 2021-06-09 ENCOUNTER — Encounter: Payer: Self-pay | Admitting: Radiology

## 2021-06-11 ENCOUNTER — Ambulatory Visit: Payer: Self-pay | Admitting: Radiation Oncology

## 2021-06-12 ENCOUNTER — Other Ambulatory Visit: Payer: Medicare Other

## 2021-06-15 ENCOUNTER — Ambulatory Visit
Admission: RE | Admit: 2021-06-15 | Discharge: 2021-06-15 | Disposition: A | Discharge status: Home / Self Care | Payer: Medicare Other | Source: Ambulatory Visit | Attending: Radiation Oncology | Admitting: Radiation Oncology

## 2021-06-15 ENCOUNTER — Other Ambulatory Visit: Payer: Medicare Other

## 2021-06-16 ENCOUNTER — Telehealth: Payer: Self-pay | Admitting: *Deleted

## 2021-06-16 NOTE — Telephone Encounter (Signed)
CALLED PATIENT TO RESCHEDULE MISSED FU ON 06-15-21, SPOKE WITH PATIENT AND RESCHEDULED FOR 07-06-21 @ 11:30 AM, PATIENT STATED THAT SHE WOULD CALL ME BACK AND LET ME KNOW IF IT WOULD WORK, NOTIFIED DR. [facility] NURSES

## 2021-06-19 ENCOUNTER — Other Ambulatory Visit: Payer: Self-pay

## 2021-06-19 ENCOUNTER — Other Ambulatory Visit: Payer: Medicare Other

## 2021-07-01 ENCOUNTER — Encounter: Payer: Self-pay | Admitting: Radiology

## 2021-07-05 NOTE — Progress Notes (Signed)
Radiation Oncology         [phone] ________________________________  Name: [name] MRN: [id number]  Date: 07/06/2021  DOB: 30-Apr-1961  Follow-Up Visit Note  CC: Forrest Moron, MD  Forrest Moron, MD    ICD-10-CM   1. Endometrial cancer ([place])  C54.1       Diagnosis: Endometrial cancer grade 2, stage IB, pT1b, pN0(i+)   Interval Since Last Radiation:  2 years, 2 months, 3 weeks, and 5 days ago  1. 02/14/2019 - 03/20/2019 ([facility]): Pelvis / 45 Gy in 25 fractions  2. 03/27/2019, 04/04/2019, 04/11/2019 (HDR, [facility]): Vaginal Cuff (cylinder, 3 cm diameter) / 18 Gy in 3 fractions (3 cm tx length)  Narrative:  The patient returns today for routine follow-up.  She was last seen by Dr. Denman George on 03/18/21 in which she was noted to have no evidence of disease recurrence on exam.    The patient did have a recent ED visit on 04/04/21 in which she presented to the [facility] ED with oral swelling. She states that she awoke from sleep that day with with a feeling of tongue swelling. Upon examination she was found to have significant sublingual swelling which had remained present since 6:30 am that morning. She stated that her voice sounded different than normal. She denied experiencing rash, itching, difficulty swallowing, shortness of breath, nausea, vomiting, or abdominal pain. She was treated with  Decadron, Benadryl and Pepcid. Upon reassessment 4 hours later she had improved and her speech returned back to normal.  Recent imaging includes a bilateral mammogram received on 04/21/21 which showed a possible mass in the left breast. Follow-up diagnostic mammogram and ultrasound of the left breast on 06/03/21 revealed benign duct ectasia within the retroareolar left breast without intraductal mass or vascularity. Overall, no evidence of malignancy was seen.   She continues to have problems with constipation.  She denies any significant abdominal bloating vaginal bleeding or pelvic pain.                            Allergies:  has No Known Allergies.  Meds: Current Outpatient Medications  Medication Sig Dispense Refill   atorvastatin (LIPITOR) 10 MG tablet Take 10 mg by mouth at bedtime.      baclofen (LIORESAL) 20 MG tablet TAKE ONE TABLET BY MOUTH THREE TIMES DAILY AND TAKE TWO TABLET BY MOUTH AT BEDTIME 150 tablet 11   Catheters (DOVER UNIVERSAL FOLEY TRAY) KIT Use foley tray kit to change catheter every 3 weeks or as needed 15 each 1   Catheters (FOLEY CATHETER 2-WAY) MISC Use to aspirate urine suprapubic 15 each 1   cholecalciferol (VITAMIN D3) 25 MCG (1000 UNIT) tablet Take 5,000 Units by mouth daily.     furosemide (LASIX) 20 MG tablet TAKE 1 TABLET BY MOUTH ONCE DAILY (Patient taking differently: Take 20 mg by mouth daily.) 90 tablet 3   glipiZIDE (GLUCOTROL) 5 MG tablet Take 1 tablet (5 mg total) by mouth daily before lunch. (Patient taking differently: Take 5 mg by mouth daily.) 90 tablet 3   hydrochlorothiazide (HYDRODIURIL) 25 MG tablet TAKE 1 TABLET BY MOUTH ONCE DAILY EVERY MORNING 90 tablet 0   hydrocortisone (ANUSOL-HC) 25 MG suppository Place 1 suppository (25 mg total) rectally 2 (two) times daily. (Patient taking differently: Place 25 mg rectally 2 (two) times daily as needed.) 12 suppository 0   hydrocortisone 2.5 % cream Place rectally.     Incontinence Supply  Disposable (TENA FLEX 16 PLUS) MISC Use daily as needed 30 each 11   Incontinence Supply Disposable (TENA PROTECT UNDERWEAR PLS/XLG) MISC Use for incontinence. 30 each 11   Interferon Beta-1b (BETASERON) 0.3 MG KIT injection Inject 0.3 mg into the skin every other day. (Patient taking differently: Inject 0.3 mg into the skin every other day. In the evening.) 36 each 3   metFORMIN (GLUCOPHAGE) 500 MG tablet Take 500 mg by mouth 2 (two) times daily.  3   MOVANTIK 25 MG TABS tablet Take 3 tablets (75 mg total) by mouth daily. (Patient taking differently: Take 75 mg by mouth every other day. In the morning) 90  tablet 4   NON FORMULARY Power wheelchair repairs  Diagnosis code z99.3 1 each 0   NON FORMULARY Dispense catheter urine drainage bags 90 Bag 3   Ostomy Supplies (NEW IMAGE SKIN/FLANGE/TAPE) MISC 1 application by Does not apply route daily as needed. 30 each 11   Oxycodone HCl 20 MG TABS Take by mouth.     oxyCODONE-acetaminophen (PERCOCET) 10-325 MG tablet Take 1 tablet by mouth every 8 (eight) hours as needed for pain. 90 tablet 0   potassium chloride (MICRO-K) 10 MEQ CR capsule Take 30 mEq by mouth 2 (two) times daily.      Syringe, Disposable, (B-D SYRINGE LUER-LOK 30CC) 30 ML MISC Use to change catheter 15 each 0   vitamin B-12 (CYANOCOBALAMIN) 500 MCG tablet Take 500 mcg by mouth daily.     clotrimazole (LOTRIMIN) 1 % cream Apply to both feet and between toes twice daily for 4 weeks. 60 g 1   No current facility-administered medications for this encounter.    Physical Findings: The patient is in no acute distress. Patient is alert and oriented.  temperature is 97.8 F (36.6 C). Her blood pressure is 122/71 and her pulse is 82. Her respiration is 20 and oxygen saturation is 100%. .  No significant changes. Lungs are clear to auscultation bilaterally. Heart has regular rate and rhythm. No palpable cervical, supraclavicular, or axillary adenopathy. Abdomen soft, non-tender, normal bowel sounds.  On pelvic examination the vaginal vault is smooth without nodularity.  Vaginal cuff intact.  Rectal exam deferred.   Lab Findings: Lab Results  Component Value Date   WBC 6.9 04/04/2021   HGB 10.1 (L) 04/04/2021   HCT 34.5 (L) 04/04/2021   MCV 84.4 04/04/2021   PLT 566 (H) 04/04/2021    Radiographic Findings: No results found.  Impression:  Endometrial cancer grade 2, stage IB, pT1b, pN0(i+)   No evidence of recurrence on clinical exam today.  Plan: I discussed with patient that since she is now 2 years out from her treatment she can proceed with 6 months interval follow-up.  She will  see gynecologic oncology in 6 months and then follow-up in radiation oncology in 1 year.   20 minutes of total time was spent for this patient encounter, including preparation, face-to-face counseling with the patient and coordination of care, physical exam, and documentation of the encounter. ____________________________________  Blair Promise, PhD, MD   This document serves as a record of services personally performed by Gery Pray, MD. It was created on his behalf by Roney Mans, a trained medical scribe. The creation of this record is based on the scribe's personal observations and the provider's statements to them. This document has been checked and approved by the attending provider.

## 2021-07-06 ENCOUNTER — Ambulatory Visit
Admission: RE | Admit: 2021-07-06 | Discharge: 2021-07-06 | Disposition: A | Discharge status: Home / Self Care | Payer: Medicare Other | Source: Ambulatory Visit | Attending: Radiation Oncology | Admitting: Radiation Oncology

## 2021-07-06 ENCOUNTER — Ambulatory Visit: Payer: Medicare Other | Admitting: Radiation Oncology

## 2021-07-06 ENCOUNTER — Other Ambulatory Visit: Payer: Self-pay

## 2021-07-06 ENCOUNTER — Encounter: Payer: Self-pay | Admitting: Radiation Oncology

## 2021-07-06 DIAGNOSIS — Z8542 Personal history of malignant neoplasm of other parts of uterus: Secondary | ICD-10-CM | POA: Diagnosis not present

## 2021-07-06 DIAGNOSIS — Z79899 Other long term (current) drug therapy: Secondary | ICD-10-CM | POA: Insufficient documentation

## 2021-07-06 DIAGNOSIS — Z923 Personal history of irradiation: Secondary | ICD-10-CM | POA: Diagnosis not present

## 2021-07-06 DIAGNOSIS — C541 Malignant neoplasm of endometrium: Secondary | ICD-10-CM

## 2021-07-06 DIAGNOSIS — K5900 Constipation, unspecified: Secondary | ICD-10-CM | POA: Insufficient documentation

## 2021-07-06 DIAGNOSIS — Z7984 Long term (current) use of oral hypoglycemic drugs: Secondary | ICD-10-CM | POA: Diagnosis not present

## 2021-07-06 NOTE — Progress Notes (Signed)
[name] is here today for follow up post radiation to the pelvic.  They completed their radiation on: external- 03/20/19, [name] -04/11/19   Does the patient complain of any of the following:  Pain:Patient reports having continuous pain Abdominal bloating: no Diarrhea/Constipation: constipation Nausea/Vomiting: no Vaginal Discharge: no Blood in Urine or Stool: no Urinary Issues (dysuria/incomplete emptying/ incontinence/ increased frequency/urgency): no Does patient report using vaginal dilator 2-3 times a week and/or sexually active 2-3 weeks: no Post radiation skin changes: no   Additional comments if applicable:  Vitals:   [id number]  BP: 122/71  Pulse: 82  Resp: 20  Temp: 97.8 F (36.6 C)  SpO2: 100%

## 2021-07-29 ENCOUNTER — Other Ambulatory Visit: Payer: Self-pay

## 2021-07-29 ENCOUNTER — Ambulatory Visit (INDEPENDENT_AMBULATORY_CARE_PROVIDER_SITE_OTHER): Payer: Medicare Other | Admitting: Podiatry

## 2021-07-29 DIAGNOSIS — L84 Corns and callosities: Secondary | ICD-10-CM | POA: Diagnosis not present

## 2021-07-29 DIAGNOSIS — B351 Tinea unguium: Secondary | ICD-10-CM | POA: Diagnosis not present

## 2021-07-29 DIAGNOSIS — E1142 Type 2 diabetes mellitus with diabetic polyneuropathy: Secondary | ICD-10-CM

## 2021-08-02 ENCOUNTER — Encounter: Payer: Self-pay | Admitting: Podiatry

## 2021-08-02 NOTE — Progress Notes (Signed)
Subjective: [name] is a pleasant 60 y.o. female patient seen today for at risk foot care with history of diabetic neuropathy and callus(es) left foot and painful thick toenails that are difficult to trim. Painful toenails interfere with ambulation. Aggravating factors include wearing enclosed shoe gear. Pain is relieved with periodic professional debridement. Painful calluses are aggravated when weightbearing with and without shoegear. Pain is relieved with periodic professional debridement.   She also has h/o multiple sclerosis.  Patient did not check blood glucose on today.  She is also here to pick up her diabetic shoes.   PCP is Forrest Moron, MD. Last visit was: 08/13/2020.  No Known Allergies  Objective: Physical Exam  General: [name] is a pleasant 60 y.o. African American female, morbidly obese in NAD. AAO x 3.   Vascular:  Capillary refill time to digits immediate b/l. Palpable DP pulse(s) b/l lower extremities Palpable PT pulse(s) b/l lower extremities Pedal hair absent. Lower extremity skin temperature gradient within normal limits. No pain with calf compression b/l. Trace edema noted b/l lower extremities.  Dermatological:  Skin warm and supple b/l lower extremities. No open wounds b/l lower extremities. Toenails 1-5 b/l elongated, discolored, dystrophic, thickened, crumbly with subungual debris and tenderness to dorsal palpation. Hyperkeratotic lesion(s) submet head 5 left foot.  No erythema, no edema, no drainage, no fluctuance.  Musculoskeletal:  Flaccid lower extremity noted b/l lower extremities. Utilizes motorized chair for mobility assistance.  Neurological:  Protective sensation diminished with 10g monofilament b/l.  Assessment and Plan:  1. Onychomycosis   2. Callus   3. Diabetic peripheral neuropathy associated with type 2 diabetes mellitus ([place])      -Examined patient. -She was not pleased with her new diabetic shoes and decided not to take them  today. She looked through the catalog and chose two additional pair: First Choice: Lauro Regulus, Item #835, Color Black, (size 12 as this is the largest size the shoe comes in. patient was informed this is what she normally wears. Second choice: Double strap walker, [id number], Black, Size 13.She will be contacted when shoes arrive. -Continue diabetic foot care principles: inspect feet daily, monitor glucose as recommended by PCP and/or Endocrinologist, and follow prescribed diet per PCP, Endocrinologist and/or dietician. -Patient to continue soft, supportive shoe gear daily. -Toenails 1-5 b/l were debrided in length and girth with sterile nail nippers and dremel without iatrogenic bleeding.  -Callus(es) submet head 5 left foot pared utilizing sterile scalpel blade without complication or incident. Total number debrided =1. -Patient to report any pedal injuries to medical professional immediately. -Patient/POA to call should there be question/concern in the interim.  Return in about 3 months (around 10/29/2021).  Marzetta Board, DPM

## 2021-08-14 ENCOUNTER — Telehealth: Payer: Self-pay | Admitting: Podiatry

## 2021-08-14 NOTE — Telephone Encounter (Signed)
Diabetic shoes in and made appt with pts daughter for pt to come pick them up on 9.23

## 2021-08-28 ENCOUNTER — Ambulatory Visit (INDEPENDENT_AMBULATORY_CARE_PROVIDER_SITE_OTHER): Payer: Medicare Other | Admitting: *Deleted

## 2021-08-28 ENCOUNTER — Other Ambulatory Visit: Payer: Self-pay

## 2021-08-28 DIAGNOSIS — G35 Multiple sclerosis: Secondary | ICD-10-CM | POA: Diagnosis not present

## 2021-08-28 DIAGNOSIS — E1142 Type 2 diabetes mellitus with diabetic polyneuropathy: Secondary | ICD-10-CM | POA: Diagnosis not present

## 2021-08-28 NOTE — Progress Notes (Signed)
Patient presents today to pick up diabetic shoes and insoles.  Patient was dispensed 1 pair of diabetic shoes only. Fit was satisfactory. Instructions for break-in and wear was reviewed and a copy was given to the patient.   Re-appointment for regularly scheduled diabetic foot care visits or if they should experience any trouble with the shoes or insoles.

## 2021-09-10 DIAGNOSIS — G8929 Other chronic pain: Secondary | ICD-10-CM | POA: Insufficient documentation

## 2021-09-10 DIAGNOSIS — M545 Low back pain, unspecified: Secondary | ICD-10-CM | POA: Insufficient documentation

## 2021-11-04 ENCOUNTER — Telehealth: Payer: Self-pay | Admitting: *Deleted

## 2021-11-04 NOTE — Telephone Encounter (Signed)
Called patient to inform of fu visit with Dr. Berline Lopes on 11-18-21- arrival time- 2 pm, patient didn't have time to talk, mailed appt. card

## 2021-11-06 ENCOUNTER — Encounter: Payer: Self-pay | Admitting: Podiatry

## 2021-11-06 ENCOUNTER — Ambulatory Visit (INDEPENDENT_AMBULATORY_CARE_PROVIDER_SITE_OTHER): Payer: Medicare Other | Admitting: Podiatry

## 2021-11-06 ENCOUNTER — Ambulatory Visit: Payer: Medicare Other | Admitting: Podiatry

## 2021-11-06 ENCOUNTER — Other Ambulatory Visit: Payer: Self-pay

## 2021-11-06 DIAGNOSIS — B351 Tinea unguium: Secondary | ICD-10-CM | POA: Diagnosis not present

## 2021-11-06 DIAGNOSIS — G35 Multiple sclerosis: Secondary | ICD-10-CM

## 2021-11-06 DIAGNOSIS — M205X9 Other deformities of toe(s) (acquired), unspecified foot: Secondary | ICD-10-CM | POA: Diagnosis not present

## 2021-11-06 DIAGNOSIS — E0842 Diabetes mellitus due to underlying condition with diabetic polyneuropathy: Secondary | ICD-10-CM | POA: Diagnosis not present

## 2021-11-06 DIAGNOSIS — L84 Corns and callosities: Secondary | ICD-10-CM

## 2021-11-06 DIAGNOSIS — E119 Type 2 diabetes mellitus without complications: Secondary | ICD-10-CM

## 2021-11-06 NOTE — Progress Notes (Signed)
ANNUAL DIABETIC FOOT EXAM  Subjective: [name] presents today for at risk foot care with history of diabetic neuropathy, at risk foot care with h/o multiple sclerosis, and thick, elongated toenails b/l lower extremities which are tender when wearing enclosed shoe gear..  Patient denies any h/o foot wounds.  Patient has been diagnosed with neuropathy.  Patient's blood sugar was 140 mg/dl today.   Forrest Moron, MD is patient's PCP. Last visit was October, 2022, per patient recollection.  New problem: Patient states her daughter noticed blisters which developed on the tip of both great toes. Patient relates she knows what shoes caused the blisters and has stopped wearing them. Blister on right great toe resolved, but lesion is still visible on tip of left great toe.She denies any redness, drainage or swelling. Denies any fever, chills, night sweats, nausea or vomiting.  Past Medical History:  Diagnosis Date   Anemia    history of   Arthritis    Decubitus ulcers    currently treated at wound center   Diabetes mellitus without complication ([place])    Diabetic polyneuropathy ([place])    Endometrial cancer ([place])    Grade 2   Fibroids    GERD (gastroesophageal reflux disease)    Heartburn    Occ   History of radiation therapy 03/20/2019   02/14/2019 - 03/20/2019 Pelvis / 45 Gy in 25 fractions  Dr Gery Pray   History of radiation therapy 04/11/2019   03/27/2019, 04/04/2019, 04/11/2019 (HDR, [facility]): Vaginal Cuff (cylinder, 3 cm diameter) / 18 Gy in 3 fractions (3 cm tx length) Dr Gery Pray   Hypertension    Mixed hyperlipidemia    Morbid obesity ([place])    MS (multiple sclerosis) ([place])    Patient Active Problem List   Diagnosis Date Noted   Chronic midline low back pain without sciatica 09/10/2021   Meningioma ([place]) 12/17/2020   Cerebral meningioma ([place]) 05/19/2020   Nuclear sclerotic cataract of both eyes 01/23/2020   Endometrial cancer ([place]) 11/21/2018   Morbid obesity  ([place]) 11/21/2018   Pain management contract agreement 02/16/2017   Mixed hyperlipidemia 11/24/2016   Type 2 diabetes mellitus with diabetic polyneuropathy, without long-term current use of insulin ([place]) 11/24/2016   Multiple sclerosis ([place]) 11/18/2016   Leg weakness, bilateral 11/18/2016   Dysesthesia 11/18/2016   Diplegia of both lower extremities ([place]) 11/18/2016   Diabetes, polyneuropathy ([place]) 11/18/2016   Chronic indwelling Foley catheter 11/17/2016   Essential hypertension 11/17/2016   Slow transit constipation 11/17/2016   Chronically on opiate therapy 11/17/2016   Physical deconditioning 11/17/2016   Past Surgical History:  Procedure Laterality Date   BRAIN TUMOR EXCISION  09/2020   COLONOSCOPY     CYSTOSCOPY N/A 01/22/2020   Procedure: CYSTOSCOPY WITH SUPRA PUBIC TUBE CHANGE, BOTOX 200 UNITS, RETROGRADE PYELOGRAM BILATERAL. FULGERATION .5 CM-2CM;  Surgeon: Festus Aloe, MD;  Location: [facility];  Service: Urology;  Laterality: N/A;   ESOPHAGOGASTRODUODENOSCOPY N/A 12/02/2017   Procedure: ESOPHAGOGASTRODUODENOSCOPY (EGD);  Surgeon: Ronnette Juniper, MD;  Location: [facility];  Service: Gastroenterology;  Laterality: N/A;   ROBOTIC ASSISTED TOTAL HYSTERECTOMY WITH BILATERAL SALPINGO OOPHERECTOMY Bilateral 12/19/2018   Procedure: XI ROBOTIC ASSISTED TOTAL HYSTERECTOMY (UTERUS GREATER THAN 250gr) WITH BILATERAL SALPINGO OOPHORECTOMY;  Surgeon: Everitt Amber, MD;  Location: [facility];  Service: Gynecology;  Laterality: Bilateral;   SENTINEL NODE BIOPSY N/A 12/19/2018   Procedure: SENTINEL NODE BIOPSY;  Surgeon: Everitt Amber, MD;  Location: [facility];  Service: Gynecology;  Laterality: N/A;   SUPRAPUBIC CATHETER  INSERTION  2009   TONSILLECTOMY     TUBAL LIGATION     Current Outpatient Medications on File Prior to Visit  Medication Sig Dispense Refill   atorvastatin (LIPITOR) 10 MG tablet Take 10 mg by mouth at bedtime.      baclofen (LIORESAL) 20 MG tablet TAKE ONE TABLET BY MOUTH THREE TIMES  DAILY AND TAKE TWO TABLET BY MOUTH AT BEDTIME 150 tablet 11   Catheters (DOVER UNIVERSAL FOLEY TRAY) KIT Use foley tray kit to change catheter every 3 weeks or as needed 15 each 1   Catheters (FOLEY CATHETER 2-WAY) MISC Use to aspirate urine suprapubic 15 each 1   cholecalciferol (VITAMIN D3) 25 MCG (1000 UNIT) tablet Take 5,000 Units by mouth daily.     clotrimazole (LOTRIMIN) 1 % cream Apply to both feet and between toes twice daily for 4 weeks. 60 g 1   furosemide (LASIX) 20 MG tablet TAKE 1 TABLET BY MOUTH ONCE DAILY (Patient taking differently: Take 20 mg by mouth daily.) 90 tablet 3   glipiZIDE (GLUCOTROL) 5 MG tablet Take 1 tablet (5 mg total) by mouth daily before lunch. (Patient taking differently: Take 5 mg by mouth daily.) 90 tablet 3   hydrochlorothiazide (HYDRODIURIL) 25 MG tablet TAKE 1 TABLET BY MOUTH ONCE DAILY EVERY MORNING 90 tablet 0   hydrocortisone (ANUSOL-HC) 25 MG suppository Place 1 suppository (25 mg total) rectally 2 (two) times daily. (Patient taking differently: Place 25 mg rectally 2 (two) times daily as needed.) 12 suppository 0   hydrocortisone 2.5 % cream Place rectally.     Incontinence Supply Disposable (TENA FLEX 16 PLUS) MISC Use daily as needed 30 each 11   Incontinence Supply Disposable (TENA PROTECT UNDERWEAR PLS/XLG) MISC Use for incontinence. 30 each 11   Interferon Beta-1b (BETASERON) 0.3 MG KIT injection Inject 0.3 mg into the skin every other day. (Patient taking differently: Inject 0.3 mg into the skin every other day. In the evening.) 36 each 3   metFORMIN (GLUCOPHAGE) 500 MG tablet Take 500 mg by mouth 2 (two) times daily.  3   MOVANTIK 25 MG TABS tablet Take 3 tablets (75 mg total) by mouth daily. (Patient taking differently: Take 75 mg by mouth every other day. In the morning) 90 tablet 4   NON FORMULARY Power wheelchair repairs  Diagnosis code z99.3 1 each 0   NON FORMULARY Dispense catheter urine drainage bags 90 Bag 3   Ostomy Supplies (NEW IMAGE  SKIN/FLANGE/TAPE) MISC 1 application by Does not apply route daily as needed. 30 each 11   Oxycodone HCl 20 MG TABS Take by mouth.     oxyCODONE-acetaminophen (PERCOCET) 10-325 MG tablet Take 1 tablet by mouth every 8 (eight) hours as needed for pain. 90 tablet 0   potassium chloride (MICRO-K) 10 MEQ CR capsule Take 30 mEq by mouth 2 (two) times daily.      Syringe, Disposable, (B-D SYRINGE LUER-LOK 30CC) 30 ML MISC Use to change catheter 15 each 0   vitamin B-12 (CYANOCOBALAMIN) 500 MCG tablet Take 500 mcg by mouth daily.     No current facility-administered medications on file prior to visit.    No Known Allergies Social History   Occupational History   Not on file  Tobacco Use   Smoking status: Never   Smokeless tobacco: Never  Vaping Use   Vaping Use: Never used  Substance and Sexual Activity   Alcohol use: No   Drug use: No   Sexual activity: Not on  file   Family History  Problem Relation Age of Onset   Cancer Mother    Hypertension Mother    Breast cancer Mother    Heart disease Father    Diabetes Father    Hypertension Sister    Hypertension Brother    Immunization History  Administered Date(s) Administered   Tdap 01/09/2020     Review of Systems: Negative except as noted in the HPI.   Objective: There were no vitals filed for this visit.  Aniyia Rane is a pleasant 60 y.o. female in NAD. AAO X 3.  Vascular Examination: Capillary refill time to digits immediate b/l. Palpable pedal pulses b/l LE. Pedal hair absent. No pain with calf compression b/l. Dependent edema noted b/l LE.  Dermatological Examination: Pedal skin is warm and supple b/l LE. No open wounds b/l LE. No interdigital macerations noted b/l LE. Toenails 1-5 b/l elongated, discolored, dystrophic, thickened, crumbly with subungual debris and tenderness to dorsal palpation. Hyperkeratotic lesion(s) L hallux.  No erythema, no edema, no drainage, no fluctuance.  Musculoskeletal Examination: Flaccid  lower extremity noted b/l lower extremities. Clawtoe deformity 2-5 bilaterally. Utilizes motorized chair for mobility assistance.  Footwear Assessment: Does the patient wear appropriate shoes? Yes. Does the patient need inserts/orthotics? Yes.  Neurological Examination: Protective sensation diminished with 10g monofilament b/l.  Assessment: 1. Onychomycosis   2. Callus   3. Acquired claw toe, unspecified laterality   4. Diabetic polyneuropathy associated with diabetes mellitus due to underlying condition ([place])   5. Multiple sclerosis ([place])   6. Encounter for diabetic foot exam ([place])     ADA Risk Categorization: High Risk  Patient has one or more of the following: Loss of protective sensation Absent pedal pulses Severe Foot deformity History of foot ulcer  Plan: -Examined patient. -Patient states she has stopped wearing the ill fitting shoes which caused the blisters on her great toes. -Diabetic foot examination performed today. -Continue foot and shoe inspections daily. Monitor blood glucose per PCP/Endocrinologist's recommendations. -Patient to continue soft, supportive shoe gear daily. Start procedure for diabetic shoes. Patient qualifies based on diagnoses. -Patient to schedule appointment with Pedorthist for diabetic shoe measurements. -Mycotic toenails 1-5 bilaterally were debrided in length and girth with sterile nail nippers and dremel without incident. -Callus(es) L hallux gently filed with rotary dremel without complication or incident. Total number debrided =1. -Patient to apply Neosporin to L hallux once daily. -Patient/POA to call should there be question/concern in the interim.  Return in about 3 months (around 02/04/2022).  Marzetta Board, DPM

## 2021-11-16 ENCOUNTER — Telehealth: Payer: Self-pay | Admitting: *Deleted

## 2021-11-16 NOTE — Telephone Encounter (Signed)
Patient called and rescheduled appt from 12/14 to 1/16

## 2021-11-18 ENCOUNTER — Ambulatory Visit: Payer: Medicare Other | Admitting: Gynecologic Oncology

## 2021-12-11 ENCOUNTER — Ambulatory Visit: Payer: Medicare Other

## 2021-12-15 ENCOUNTER — Encounter: Payer: Self-pay | Admitting: Gynecologic Oncology

## 2021-12-16 ENCOUNTER — Telehealth: Payer: Self-pay | Admitting: *Deleted

## 2021-12-16 NOTE — Telephone Encounter (Signed)
Attempted to reach the patient to reschedule appt from 1/13 with Dr Berline Lopes to 1/12 with Dr Delsa Sale; no answer and no voicemail set up

## 2021-12-18 ENCOUNTER — Telehealth: Payer: Self-pay | Admitting: *Deleted

## 2021-12-18 ENCOUNTER — Encounter: Payer: Self-pay | Admitting: Gynecologic Oncology

## 2021-12-18 ENCOUNTER — Other Ambulatory Visit: Payer: Self-pay

## 2021-12-18 ENCOUNTER — Inpatient Hospital Stay: Payer: Medicare Other | Attending: Gynecologic Oncology | Admitting: Gynecologic Oncology

## 2021-12-18 VITALS — BP 128/63 | HR 71 | Temp 98.2°F | Resp 16

## 2021-12-18 DIAGNOSIS — K5900 Constipation, unspecified: Secondary | ICD-10-CM | POA: Insufficient documentation

## 2021-12-18 DIAGNOSIS — Z86011 Personal history of benign neoplasm of the brain: Secondary | ICD-10-CM | POA: Insufficient documentation

## 2021-12-18 DIAGNOSIS — Z90722 Acquired absence of ovaries, bilateral: Secondary | ICD-10-CM | POA: Insufficient documentation

## 2021-12-18 DIAGNOSIS — G35 Multiple sclerosis: Secondary | ICD-10-CM | POA: Insufficient documentation

## 2021-12-18 DIAGNOSIS — Z978 Presence of other specified devices: Secondary | ICD-10-CM

## 2021-12-18 DIAGNOSIS — Z923 Personal history of irradiation: Secondary | ICD-10-CM | POA: Insufficient documentation

## 2021-12-18 DIAGNOSIS — C541 Malignant neoplasm of endometrium: Secondary | ICD-10-CM

## 2021-12-18 DIAGNOSIS — Z8542 Personal history of malignant neoplasm of other parts of uterus: Secondary | ICD-10-CM | POA: Insufficient documentation

## 2021-12-18 DIAGNOSIS — Z993 Dependence on wheelchair: Secondary | ICD-10-CM | POA: Diagnosis not present

## 2021-12-18 DIAGNOSIS — Z90710 Acquired absence of both cervix and uterus: Secondary | ICD-10-CM | POA: Insufficient documentation

## 2021-12-18 NOTE — Progress Notes (Addendum)
Gynecologic Oncology Return Clinic Visit  12/18/21  Reason for Visit: Surveillance visit in the setting of high-intermediate risk uterine cancer  Treatment History: [name] is a 61 year old P1 who is seen in consultation at the request of Dr Elly Modena for grade 2 endometrial cancer.   The patient reports a history of vaginal bleeding for 6 months (since the summer of 2019).  Formed a transvaginal ultrasound scan on November 15, 2018 which revealed a uterus measuring 11.5 x 7.2 x 7.3 cm with a posterior lower uterine segment myoma measuring approximately 6 cm.  The endometrium was thickened to 34 mm.  The left and right ovaries were not clearly visualized.  An endometrial biopsy was then taken in the office which revealed FIGO grade 2 endometrioid adenocarcinoma.   The patient has a medical history significant for multiple sclerosis.  She is wheelchair-bound.  She has a suprapubic catheter.  She has spasms and no function in her lower extremities. She lives with her daughter.  She is independent through the day in her motorized wheelchair.  She requires Hoyer lift for transfers.     A suprapubic catheter was placed in 2009 and then later replaced.  Her neurologist is Dr. Felecia Shelling. Endometrial biopsy was performed on 11/10/18 which showed FIGO grade 2 endometrial cancer. [place] on November 15, 2018 showed a uterus measuring 11.5 x 7.2 x 7.3 cm with a 5 cm posterior leiomyoma in the mid to lower uterine segment.  There was a thickened endometrium at 34 mm.  The ovaries were not visualized.  There is no free fluid.  On 12/19/18 she underwent robotic assisted total hysterectomy BSO sentinel lymph node biopsy.  The uterus is greater than 250 g.  The procedure was complicated by morbid obesity and multiple sclerosis, paralysis.   Final pathology revealed a FIGO grade 2 endometrial cancer with outer half myometrial invasion (30 mm of 40 mm depth invasion close ).  LVSI was identified.  There was carcinoma  involving the lower uterine segment.  The tumor size was 9.5 cm.  Ovaries and fallopian tubes and cervix were unremarkable.  There were no macro micrometastases and sentinel lymph nodes however isolated tumor cells were identified in the left obturator sentinel lymph node.   She was staged as FIGO stage Ib grade 2 adenocarcinoma of the endometrium with high risk features and recommendation for adjuvant radiation was made in accordance with NCCN guidelines.   She received adjuvant radiation for her high risk early stage endometrial cancer. Radiation treatment dates:   1. IMRT: 02/14/19-03/20/19                                                  2. HDR Ir-192: 03/27/19, 04/04/19, 04/11/19    Site/dose: 1. Pelvis; 25 fractions of 1.8 Gy for a total of 45 Gy                    2. Vagina, 6 Gy in 3 fractions for a total dose of 18 Gy  She had brain surgery in October, 2021 for a benign brain tumor. [facility] was at [facility]. She did well from surgery with no complications.    Interval History: She saw Dr. Sondra Come on 8/1. She was doing well and NED.  Today, she reports overall doing well.  She had a nice holiday with  her family in town which includes 2 daughters and multiple grandchildren.  She denies any vaginal bleeding or discharge.  She endorses a good appetite without nausea or emesis.  She continues to struggle with baseline constipation, denies any recent changes.  Continues to have suprapubic catheter in place.  Past Medical/Surgical History: Past Medical History:  Diagnosis Date   Anemia    history of   Arthritis    Decubitus ulcers    currently treated at wound center   Diabetes mellitus without complication ([place])    Diabetic polyneuropathy ([place])    Endometrial cancer ([place])    Grade 2   Fibroids    GERD (gastroesophageal reflux disease)    Heartburn    Occ   History of radiation therapy 03/20/2019   02/14/2019 - 03/20/2019 Pelvis / 45 Gy in 25 fractions  Dr Gery Pray   History of  radiation therapy 04/11/2019   03/27/2019, 04/04/2019, 04/11/2019 (HDR, Brachy): Vaginal Cuff (cylinder, 3 cm diameter) / 18 Gy in 3 fractions (3 cm tx length) Dr Gery Pray   Hypertension    Mixed hyperlipidemia    Morbid obesity ([place])    MS (multiple sclerosis) ([place])     Past Surgical History:  Procedure Laterality Date   BRAIN TUMOR EXCISION  09/2020   COLONOSCOPY     CYSTOSCOPY N/A 01/22/2020   Procedure: CYSTOSCOPY WITH SUPRA PUBIC TUBE CHANGE, BOTOX 200 UNITS, RETROGRADE PYELOGRAM BILATERAL. FULGERATION .5 CM-2CM;  Surgeon: Festus Aloe, MD;  Location: [facility];  Service: Urology;  Laterality: N/A;   ESOPHAGOGASTRODUODENOSCOPY N/A 12/02/2017   Procedure: ESOPHAGOGASTRODUODENOSCOPY (EGD);  Surgeon: Ronnette Juniper, MD;  Location: [facility];  Service: Gastroenterology;  Laterality: N/A;   ROBOTIC ASSISTED TOTAL HYSTERECTOMY WITH BILATERAL SALPINGO OOPHERECTOMY Bilateral 12/19/2018   Procedure: XI ROBOTIC ASSISTED TOTAL HYSTERECTOMY (UTERUS GREATER THAN 250gr) WITH BILATERAL SALPINGO OOPHORECTOMY;  Surgeon: Everitt Amber, MD;  Location: [facility];  Service: Gynecology;  Laterality: Bilateral;   SENTINEL NODE BIOPSY N/A 12/19/2018   Procedure: SENTINEL NODE BIOPSY;  Surgeon: Everitt Amber, MD;  Location: [facility];  Service: Gynecology;  Laterality: N/A;   SUPRAPUBIC CATHETER INSERTION  2009   TONSILLECTOMY     TUBAL LIGATION      Family History  Problem Relation Age of Onset   Cancer Mother    Hypertension Mother    Breast cancer Mother    Heart disease Father    Diabetes Father    Hypertension Sister    Hypertension Brother     Social History   Socioeconomic History   Marital status: Single    Spouse name: Not on file   Number of children: Not on file   Years of education: Not on file   Highest education level: Not on file  Occupational History   Not on file  Tobacco Use   Smoking status: Never   Smokeless tobacco: Never  Vaping Use   Vaping Use: Never used  Substance  and Sexual Activity   Alcohol use: No   Drug use: No   Sexual activity: Not on file  Other Topics Concern   Not on file  Social History Narrative   Not on file   Social Determinants of Health   Financial Resource Strain: Not on file  Food Insecurity: Not on file  Transportation Needs: Not on file  Physical Activity: Not on file  Stress: Not on file  Social Connections: Not on file    Current Medications:  Current Outpatient Medications:    atorvastatin (LIPITOR)  10 MG tablet, Take 10 mg by mouth at bedtime. , Disp: , Rfl:    baclofen (LIORESAL) 20 MG tablet, TAKE ONE TABLET BY MOUTH THREE TIMES DAILY AND TAKE TWO TABLET BY MOUTH AT BEDTIME, Disp: 150 tablet, Rfl: 11   cholecalciferol (VITAMIN D3) 25 MCG (1000 UNIT) tablet, Take 5,000 Units by mouth daily., Disp: , Rfl:    clotrimazole (LOTRIMIN) 1 % cream, Apply to both feet and between toes twice daily for 4 weeks., Disp: 60 g, Rfl: 1   furosemide (LASIX) 20 MG tablet, TAKE 1 TABLET BY MOUTH ONCE DAILY (Patient taking differently: Take 20 mg by mouth daily.), Disp: 90 tablet, Rfl: 3   glipiZIDE (GLUCOTROL) 5 MG tablet, Take 1 tablet (5 mg total) by mouth daily before lunch. (Patient taking differently: Take 5 mg by mouth daily.), Disp: 90 tablet, Rfl: 3   hydrochlorothiazide (HYDRODIURIL) 25 MG tablet, TAKE 1 TABLET BY MOUTH ONCE DAILY EVERY MORNING, Disp: 90 tablet, Rfl: 0   hydrocortisone (ANUSOL-HC) 25 MG suppository, Place 1 suppository (25 mg total) rectally 2 (two) times daily. (Patient taking differently: Place 25 mg rectally 2 (two) times daily as needed.), Disp: 12 suppository, Rfl: 0   hydrocortisone 2.5 % cream, Place rectally., Disp: , Rfl:    metFORMIN (GLUCOPHAGE) 500 MG tablet, Take 500 mg by mouth 2 (two) times daily., Disp: , Rfl: 3   MOVANTIK 25 MG TABS tablet, Take 3 tablets (75 mg total) by mouth daily. (Patient taking differently: Take 75 mg by mouth every other day. In the morning), Disp: 90 tablet, Rfl: 4    Oxycodone HCl 20 MG TABS, Take by mouth., Disp: , Rfl:    potassium chloride (MICRO-K) 10 MEQ CR capsule, Take 30 mEq by mouth 2 (two) times daily. , Disp: , Rfl:    vitamin B-12 (CYANOCOBALAMIN) 500 MCG tablet, Take 500 mcg by mouth daily., Disp: , Rfl:    Catheters (DOVER UNIVERSAL FOLEY TRAY) KIT, Use foley tray kit to change catheter every 3 weeks or as needed, Disp: 15 each, Rfl: 1   Catheters (FOLEY CATHETER 2-WAY) MISC, Use to aspirate urine suprapubic, Disp: 15 each, Rfl: 1   Incontinence Supply Disposable (TENA FLEX 16 PLUS) MISC, Use daily as needed, Disp: 30 each, Rfl: 11   Incontinence Supply Disposable (TENA PROTECT UNDERWEAR PLS/XLG) MISC, Use for incontinence., Disp: 30 each, Rfl: 11   Interferon Beta-1b (BETASERON) 0.3 MG KIT injection, Inject 0.3 mg into the skin every other day. (Patient not taking: Reported on 12/15/2021), Disp: 36 each, Rfl: 3   NON FORMULARY, Power wheelchair repairs  Diagnosis code z99.3, Disp: 1 each, Rfl: 0   NON FORMULARY, Dispense catheter urine drainage bags, Disp: 90 Bag, Rfl: 3   Ostomy Supplies (NEW IMAGE SKIN/FLANGE/TAPE) MISC, 1 application by Does not apply route daily as needed., Disp: 30 each, Rfl: 11   oxyCODONE-acetaminophen (PERCOCET) 10-325 MG tablet, Take 1 tablet by mouth every 8 (eight) hours as needed for pain. (Patient not taking: Reported on 12/15/2021), Disp: 90 tablet, Rfl: 0   Syringe, Disposable, (B-D SYRINGE LUER-LOK 30CC) 30 ML MISC, Use to change catheter, Disp: 15 each, Rfl: 0  Review of Systems: + constipation, wound, numbness Denies appetite changes, fevers, chills, fatigue, unexplained weight changes. Denies hearing loss, neck lumps or masses, mouth sores, ringing in ears or voice changes. Denies cough or wheezing.  Denies shortness of breath. Denies chest pain or palpitations. Denies leg swelling. Denies abdominal distention, pain, blood in stools, diarrhea, nausea, vomiting, or  early satiety. Denies pain with intercourse,  dysuria, frequency, hematuria or incontinence. Denies hot flashes, pelvic pain, vaginal bleeding or vaginal discharge.   Denies joint pain, back pain or muscle pain/cramps. Denies itching, rash. Denies dizziness, headaches or seizures. Denies swollen lymph nodes or glands, denies easy bruising or bleeding. Denies anxiety, depression, confusion, or decreased concentration.  Physical Exam: BP 128/63 (BP Location: Left Wrist, Patient Position: Sitting)    Pulse 71    Temp 98.2 F (36.8 C) (Oral)    Resp 16    SpO2 97%  General: Alert, oriented, no acute distress. HEENT: Normocephalic, atraumatic, sclera anicteric. Chest: Clear to auscultation bilaterally.  No wheezes or rhonchi. Cardiovascular: Regular rate and rhythm, no murmurs. Abdomen: Obese, soft, nontender.  Normoactive bowel sounds.  No masses or hepatosplenomegaly appreciated.  Well-healed incisions. Extremities: Limited mobility of lower extremities, warm and well perfused.   Skin: Some areas of hypopigmentation on bilateral labia and upper legs. Lymphatics: No cervical, supraclavicular, or inguinal adenopathy. GU: Normal appearing external genitalia without erythema, excoriation, or lesions.  Speculum exam reveals mildly atrophic vaginal mucosa with radiation changes present, no bleeding or discharge, no masses.  Bimanual exam reveals cuff intact, no nodularity.  Rectovaginal exam confirms these findings.  Laboratory & Radiologic Studies: None new  Assessment & Plan: [name] is a 61 y.o. woman with MS (wheelchair bound) with a history of stage IB grade 2 endometrial cancer with positive ITC's in the SLN's and LVSI. High/intermediate risk factors, s/p WPRT and vaginal brachytherapy completed May, 2020.    Patient is now just over 2 and half years out from completion of adjuvant therapy and remains NED.  She is very happy to hear this.  We reviewed signs and symptoms that would be concerning for recurrence and I have encouraged  her to call if she develops any of these between visits.  Otherwise, we will continue with surveillance visits alternating between myself and radiation oncology every 6 months.  I have asked her to call back at the end of 2023 to get a visit scheduled with me this time next year.  28 minutes of total time was spent for this patient encounter, including preparation, face-to-face counseling with the patient and coordination of care, and documentation of the encounter.  Jeral Pinch, MD  Division of Gynecologic Oncology  Department of Obstetrics and Gynecology  [facility]

## 2021-12-18 NOTE — Patient Instructions (Signed)
It was good to meet you today.  I do not see or feel any evidence of cancer recurrence.  You are scheduled to see Dr. Sondra Come in August.  I will see you in January 2024.  Please call back sometime in the late fall or early winter of this year to get that visit scheduled, as my schedule is out that far in advance.  As always, please call if you develop any new symptoms between visits that would be concerning for cancer recurrence, such as vaginal bleeding, discharge, increased pelvic pain, change to bowel function, or unintentional weight loss.

## 2021-12-18 NOTE — Telephone Encounter (Signed)
CALLED PATIENT TO INFORM THAT GYN ONC WANTS HER TO SEE DR. [facility] IN July , ARRANGED 06-14-22 FU APPT. @ 3 PM, LVM FOR A RETURN CALL

## 2021-12-18 NOTE — Telephone Encounter (Signed)
xxxxx 

## 2021-12-30 ENCOUNTER — Encounter: Payer: Self-pay | Admitting: Neurology

## 2021-12-30 ENCOUNTER — Ambulatory Visit (INDEPENDENT_AMBULATORY_CARE_PROVIDER_SITE_OTHER): Payer: Medicare Other | Admitting: Neurology

## 2021-12-30 VITALS — BP 128/79 | HR 85 | Ht 67.0 in

## 2021-12-30 DIAGNOSIS — G35 Multiple sclerosis: Secondary | ICD-10-CM | POA: Diagnosis not present

## 2021-12-30 DIAGNOSIS — G8220 Paraplegia, unspecified: Secondary | ICD-10-CM

## 2021-12-30 DIAGNOSIS — D329 Benign neoplasm of meninges, unspecified: Secondary | ICD-10-CM

## 2021-12-30 NOTE — Progress Notes (Signed)
[facility]  PATIENT: [name] DOB: 04-10-1961  REFERRING DOCTOR OR PCP:  Delia Chimes SOURCE: patient, records from Dr. Nolon Rod  _________________________________   HISTORICAL  CHIEF COMPLAINT:  Chief Complaint  Patient presents with   Follow-up    RM 1, alone. Last seen 12/24/20. MS-on Betaseron.In electric WC. Having worsening muscle spasms in legs.     HISTORY OF PRESENT ILLNESS:  [name] is a 61 y.o. woman with relapsing / active SPMS diagnosed in 2006.  Update 12/19/2019: [name] feels [name] is doing about the same as last year.     [name] stopped Betaseron January 2022.   [name] cannot wal and uses a wheelchair.  [name] feels strength is the same.   [name] needs a [employer] fast streamer to transfer.    Her daughters live with her and help her.      [name] has muscle spasticity in her legs., left = right, the same as last year   [name] has umbness in her feet and right leg.    [name] denies numbness in her hands.     [name] has a suprapubic catheter.  [name] has had some UTs    [name] feels vision is the same with sequela of left optic neuritis.     [name] notes some fatigue that is stable.  [name] is sleeping   Her head and feet are elevated at night in bed.    [name] denies depression or anxiety.    [name] denies cognitive issues.    [name] had a complete hysterectomy due to endometrial cancer and has done well.   [name] did well during recovery.    [name] had a meningioma resection by Dr. Jalene Mullet at [facility] and has done well.   A follow-up MRI 11/17/2021 showed no recurrence.    MS History:   In 2006, [name] presented with tingling in her hands and right greater than left leg weakness.    [name] saw Dr. Luberta Robertson in [facility]. [name] was placed on Copaxone. Unfortunately,  [name] had a major exacerbation later that year and lost most of the use of her legs.  [name] was then placed on Betaseron and remains on Betaseron. [name] does not think [name] has had any exacerbations while [name] is on it. [name] tolerates it well and does not have any  skin reactions.    Since 2007, [name] has been predominantly wheelchair bound strength has progressively worsened and [name] went from using a walker for short distances to being completely wheelchair bound a few years ago.   Her last MRI was greater than 10 years ago.    REVIEW OF SYSTEMS: Constitutional: No fevers, chills, sweats, or change in appetite Eyes: Reduced left vision.  No eye pain Ear, nose and throat: No hearing loss, ear pain, nasal congestion, sore throat Cardiovascular: No chest pain, palpitations Respiratory:  No shortness of breath at rest or with exertion.   No wheezes GastrointestinaI: No nausea, vomiting, diarrhea, abdominal pain, fecal incontinence Genitourinary:  [name] has a suprapubic catheter.. Musculoskeletal:  No neck pain, back pain Integumentary: No rash, pruritus, skin lesions Neurological: as above Psychiatric: Denies depression at this time.  No anxiety Endocrine: has NIDDM Hematologic/Lymphatic:  No anemia, purpura, petechiae. Allergic/Immunologic: No itchy/runny eyes, nasal congestion, recent allergic reactions, rashes  ALLERGIES: No Known Allergies  HOME MEDICATIONS:  Current Outpatient Medications:    atorvastatin (LIPITOR) 10 MG tablet, Take 10 mg by mouth at bedtime. , Disp: , Rfl:    baclofen (LIORESAL) 20 MG tablet, TAKE  ONE TABLET BY MOUTH THREE TIMES DAILY AND TAKE TWO TABLET BY MOUTH AT BEDTIME, Disp: 150 tablet, Rfl: 11   cholecalciferol (VITAMIN D3) 25 MCG (1000 UNIT) tablet, Take 5,000 Units by mouth daily., Disp: , Rfl:    clotrimazole (LOTRIMIN) 1 % cream, Apply to both feet and between toes twice daily for 4 weeks., Disp: 60 g, Rfl: 1   furosemide (LASIX) 20 MG tablet, TAKE 1 TABLET BY MOUTH ONCE DAILY (Patient taking differently: Take 20 mg by mouth daily.), Disp: 90 tablet, Rfl: 3   glipiZIDE (GLUCOTROL) 5 MG tablet, Take 1 tablet (5 mg total) by mouth daily before lunch. (Patient taking differently: Take 5 mg by mouth daily.), Disp: 90  tablet, Rfl: 3   hydrochlorothiazide (HYDRODIURIL) 25 MG tablet, TAKE 1 TABLET BY MOUTH ONCE DAILY EVERY MORNING, Disp: 90 tablet, Rfl: 0   hydrocortisone (ANUSOL-HC) 25 MG suppository, Place 1 suppository (25 mg total) rectally 2 (two) times daily. (Patient taking differently: Place 25 mg rectally 2 (two) times daily as needed.), Disp: 12 suppository, Rfl: 0   hydrocortisone 2.5 % cream, Place rectally., Disp: , Rfl:    Incontinence Supply Disposable (TENA FLEX 16 PLUS) MISC, Use daily as needed, Disp: 30 each, Rfl: 11   Incontinence Supply Disposable (TENA PROTECT UNDERWEAR PLS/XLG) MISC, Use for incontinence., Disp: 30 each, Rfl: 11   Interferon Beta-1b (BETASERON) 0.3 MG KIT injection, Inject 0.3 mg into the skin every other day., Disp: 36 each, Rfl: 3   metFORMIN (GLUCOPHAGE) 500 MG tablet, Take 500 mg by mouth 2 (two) times daily., Disp: , Rfl: 3   MOVANTIK 25 MG TABS tablet, Take 3 tablets (75 mg total) by mouth daily. (Patient taking differently: Take 75 mg by mouth every other day. In the morning), Disp: 90 tablet, Rfl: 4   NON FORMULARY, Power wheelchair repairs  Diagnosis code z99.3, Disp: 1 each, Rfl: 0   NON FORMULARY, Dispense catheter urine drainage bags, Disp: 90 Bag, Rfl: 3   Ostomy Supplies (NEW IMAGE SKIN/FLANGE/TAPE) MISC, 1 application by Does not apply route daily as needed., Disp: 30 each, Rfl: 11   Oxycodone HCl 20 MG TABS, Take by mouth., Disp: , Rfl:    oxyCODONE-acetaminophen (PERCOCET) 10-325 MG tablet, Take 1 tablet by mouth every 8 (eight) hours as needed for pain., Disp: 90 tablet, Rfl: 0   potassium chloride (MICRO-K) 10 MEQ CR capsule, Take 30 mEq by mouth 2 (two) times daily. , Disp: , Rfl:    Syringe, Disposable, (B-D SYRINGE LUER-LOK 30CC) 30 ML MISC, Use to change catheter, Disp: 15 each, Rfl: 0   vitamin B-12 (CYANOCOBALAMIN) 500 MCG tablet, Take 500 mcg by mouth daily., Disp: , Rfl:   PAST MEDICAL HISTORY: Past Medical History:  Diagnosis Date   Anemia     history of   Arthritis    Decubitus ulcers    currently treated at wound center   Diabetes mellitus without complication ([place])    Diabetic polyneuropathy ([place])    Endometrial cancer ([place])    Grade 2   Fibroids    GERD (gastroesophageal reflux disease)    Heartburn    Occ   History of radiation therapy 03/20/2019   02/14/2019 - 03/20/2019 Pelvis / 45 Gy in 25 fractions  Dr Gery Pray   History of radiation therapy 04/11/2019   03/27/2019, 04/04/2019, 04/11/2019 (HDR, [facility]): Vaginal Cuff (cylinder, 3 cm diameter) / 18 Gy in 3 fractions (3 cm tx length) Dr Gery Pray   Hypertension  Mixed hyperlipidemia    Morbid obesity ([place])    MS (multiple sclerosis) ([place])     PAST SURGICAL HISTORY: Past Surgical History:  Procedure Laterality Date   BRAIN TUMOR EXCISION  09/2020   COLONOSCOPY     CYSTOSCOPY N/A 01/22/2020   Procedure: CYSTOSCOPY WITH SUPRA PUBIC TUBE CHANGE, BOTOX 200 UNITS, RETROGRADE PYELOGRAM BILATERAL. FULGERATION .5 CM-2CM;  Surgeon: Festus Aloe, MD;  Location: [facility];  Service: Urology;  Laterality: N/A;   ESOPHAGOGASTRODUODENOSCOPY N/A 12/02/2017   Procedure: ESOPHAGOGASTRODUODENOSCOPY (EGD);  Surgeon: Ronnette Juniper, MD;  Location: [facility];  Service: Gastroenterology;  Laterality: N/A;   ROBOTIC ASSISTED TOTAL HYSTERECTOMY WITH BILATERAL SALPINGO OOPHERECTOMY Bilateral 12/19/2018   Procedure: XI ROBOTIC ASSISTED TOTAL HYSTERECTOMY (UTERUS GREATER THAN 250gr) WITH BILATERAL SALPINGO OOPHORECTOMY;  Surgeon: Everitt Amber, MD;  Location: [facility];  Service: Gynecology;  Laterality: Bilateral;   SENTINEL NODE BIOPSY N/A 12/19/2018   Procedure: SENTINEL NODE BIOPSY;  Surgeon: Everitt Amber, MD;  Location: [facility];  Service: Gynecology;  Laterality: N/A;   SUPRAPUBIC CATHETER INSERTION  2009   TONSILLECTOMY     TUBAL LIGATION      FAMILY HISTORY: Family History  Problem Relation Age of Onset   Cancer Mother    Hypertension Mother    Breast cancer Mother    Heart  disease Father    Diabetes Father    Hypertension Sister    Hypertension Brother     SOCIAL HISTORY:  Social History   Socioeconomic History   Marital status: Single    Spouse name: Not on file   Number of children: Not on file   Years of education: Not on file   Highest education level: Not on file  Occupational History   Not on file  Tobacco Use   Smoking status: Never   Smokeless tobacco: Never  Vaping Use   Vaping Use: Never used  Substance and Sexual Activity   Alcohol use: No   Drug use: No   Sexual activity: Not on file  Other Topics Concern   Not on file  Social History Narrative   Not on file   Social Determinants of Health   Financial Resource Strain: Not on file  Food Insecurity: Not on file  Transportation Needs: Not on file  Physical Activity: Not on file  Stress: Not on file  Social Connections: Not on file  Intimate Partner Violence: Not on file     PHYSICAL EXAM  Vitals:   12/30/21 1408  BP: 128/79  Pulse: 85  SpO2: 95%  Height: _0  (1.702 m)    Body mass index is 45.26 kg/m.   General: The patient is well-developed and well-nourished and in no acute distress in a wheelchair  Neck:    The neck is nontender with a good range of motion.   Skin/Ext.: Arms/legs without rash.   [name] has mild pedal edema.  Neurologic Exam  Mental status: The patient is alert and oriented x 3 at the time of the examination. The patient has apparent normal recent and remote memory, with an apparently normal attention span and concentration ability.   Speech is normal.  Cranial nerves: Extraocular movements are full.  Color vision was symmetric but acuity was reduced in the left eye.  Facial strength was normal.  Facial sensation was normal.  Trapezius strength was normal.  Hearing was normal and symmetric.  Motor:  Muscle bulk is normal.   Tone is increased in both legs, slightly more on the right than the  left. [name] has normal strength in both arms.  Strength is 0/5 in right leg and 1/5 left iliopsoas and 2-/5 elsewhere in left leg   Sensory: [name] reported symmetric touch and vibration sensation in the arms and legs. Coordination: Cerebellar testing reveals good finger-nose-finger .  RAM in hands is normal.   [name] is unable to do heel-to-shin  Gait and station: [name] is wheelchair bound and cannot stand   Reflexes: Deep tendon reflexes are normal in the arms. [name] has increased reflexes at the knees with spread. [name] has reduced reflexes at the ankles.  No ankle clonus..       ASSESSMENT AND PLAN  Multiple sclerosis ([place])  Meningioma ([facility])  Diplegia of both lower extremities ([place])    1.  [name] has secondary progressive MS and will remain off of a disease modifying therapy..  2.  Continue baclofen and increase to 5 x 20 mg daily.   3.  Continue her other medications.  4.   [name] will follow-up with neurosurgery at [facility] for the meningioma.  [name] would have periodic MRIs there.  5.   [name] will follow-up in 12 months or sooner if there are new or worsening neurologic symptoms   Jeweliana Dudgeon A. Felecia Shelling, MD, PhD 01/29/4974, 3:00 PM Certified in Neurology, Clinical Neurophysiology, Sleep Medicine, Pain Medicine and Neuroimaging  [facility] [address], [place], [place] [address] [phone]

## 2022-01-01 DIAGNOSIS — M67431 Ganglion, right wrist: Secondary | ICD-10-CM | POA: Insufficient documentation

## 2022-01-01 DIAGNOSIS — M25532 Pain in left wrist: Secondary | ICD-10-CM | POA: Insufficient documentation

## 2022-01-01 DIAGNOSIS — M67432 Ganglion, left wrist: Secondary | ICD-10-CM | POA: Insufficient documentation

## 2022-01-01 DIAGNOSIS — M25531 Pain in right wrist: Secondary | ICD-10-CM | POA: Insufficient documentation

## 2022-01-13 ENCOUNTER — Encounter: Payer: Self-pay | Admitting: Gastroenterology

## 2022-02-08 ENCOUNTER — Ambulatory Visit (INDEPENDENT_AMBULATORY_CARE_PROVIDER_SITE_OTHER): Payer: Medicare Other | Admitting: Gastroenterology

## 2022-02-08 ENCOUNTER — Encounter: Payer: Self-pay | Admitting: Gastroenterology

## 2022-02-08 VITALS — BP 120/70 | HR 94 | Ht 67.0 in

## 2022-02-08 DIAGNOSIS — K5909 Other constipation: Secondary | ICD-10-CM

## 2022-02-08 DIAGNOSIS — R140 Abdominal distension (gaseous): Secondary | ICD-10-CM | POA: Diagnosis not present

## 2022-02-08 DIAGNOSIS — R110 Nausea: Secondary | ICD-10-CM | POA: Diagnosis not present

## 2022-02-08 MED ORDER — PROCHLORPERAZINE MALEATE 10 MG PO TABS: 10.0000 mg | ORAL_TABLET | Freq: Two times a day (BID) | ORAL | 1 refills | Status: DC

## 2022-02-08 NOTE — Patient Instructions (Addendum)
If you are age 62 or older, your body mass index should be between 23-30. Your Body mass index is 45.26 kg/m?Marland Kitchen If this is out of the aforementioned range listed, please consider follow up with your Primary Care Provider. ? ?If you are age 39 or younger, your body mass index should be between 19-25. Your Body mass index is 45.26 kg/m?Marland Kitchen If this is out of the aformentioned range listed, please consider follow up with your Primary Care Provider.  ? ?________________________________________________________ ? ?The [facility] providers would like to encourage you to use [facility] to communicate with providers for non-urgent requests or questions.  Due to long hold times on the telephone, sending your provider a message by [facility] may be a faster and more efficient way to get a response.  Please allow 48 business hours for a response.  Please remember that this is for non-urgent requests.  ?_______________________________________________________     ? ?Start using a Ducolax suppository  followed by a fleet enema 30 minutes after the suppository. Do this twice a week.  ? ?We have sent Compazine to the pharmacy to help with Nausea.  ?It was a pleasure to see you today! ? ?Thank you for trusting me with your gastrointestinal care!   ? ? ?

## 2022-02-08 NOTE — Progress Notes (Signed)
[facility] Consult Note:  History: [name] 02/08/2022  Referring provider: Harrison Mons, PA  Reason for consult/chief complaint: Constipation (Onset x years, no rectal bleeding, in wheelchair 24/7, hemorrhoids, nausea)   Subjective  HPI:  This is a 61 year old woman referred by primary care for chronic constipation.  She has struggled with it for many years since a diagnosis of MS and for as long as she has required opiates for pain control.  Outpatient upper endoscopy by Dr. Therisa Doyne of [facility] December 2018 for generalized abdominal pain and early satiety.  Report on file: Scattered gastric erosions, otherwise normal study.  Duodenal biopsy normal.  Gastric biopsy negative for H. pylori. Patient decided not to return to the practice.  She cannot recall if any particular treatments were given for any constipation at that practice.  He recalls being given lactulose and Linzess at some point in the past but does not believe they were helpful.  She reports a negative Cologuard test in 2021, and had a colonoscopy in [place], [place] years before that. She has been on Movantik 2 tablets every other day prescribed by primary care, and that she has a small BM on average 3 times a week.  She never feels completely evacuated, she has chronic bloating and discomfort as well as nausea, all of which she feels is related to constipation. Neysa is unable to walk due to her MS and is confined to wheelchair, living in an apartment with her daughter who helps care for her along with a home health aide.  She requires a lift to be removed from the wheelchair.  Therefore, she cannot be completely examined today including rectal exam.  ROS:  Review of Systems  Constitutional:  Negative for appetite change and unexpected weight change.  HENT:  Negative for mouth sores and voice change.   Eyes:  Negative for pain and redness.  Respiratory:  Negative for cough and  shortness of breath.   Cardiovascular:  Negative for chest pain and palpitations.  Genitourinary:  Negative for dysuria and hematuria.  Musculoskeletal:  Negative for arthralgias and myalgias.       Muscle spasticity  Skin:  Negative for pallor and rash.  Neurological:  Negative for weakness and headaches.  Hematological:  Negative for adenopathy.  Chronic pain  Past Medical History: Past Medical History:  Diagnosis Date   Anemia    history of   Arthritis    Decubitus ulcers    currently treated at wound center   Diabetes mellitus without complication ([place])    Diabetic polyneuropathy ([place])    Endometrial cancer ([place])    Grade 2   Fibroids    GERD (gastroesophageal reflux disease)    Heartburn    Occ   History of radiation therapy 03/20/2019   02/14/2019 - 03/20/2019 Pelvis / 45 Gy in 25 fractions  Dr Gery Pray   History of radiation therapy 04/11/2019   03/27/2019, 04/04/2019, 04/11/2019 (HDR, [facility]): Vaginal Cuff (cylinder, 3 cm diameter) / 18 Gy in 3 fractions (3 cm tx length) Dr Gery Pray   Hypertension    Mixed hyperlipidemia    Morbid obesity ([place])    MS (multiple sclerosis) ([place])    Most recent endocrinology, gynecology and neurology office notes were reviewed.   Past Surgical History: Past Surgical History:  Procedure Laterality Date   BRAIN TUMOR EXCISION  09/2020   COLONOSCOPY     CYSTOSCOPY N/A 01/22/2020   Procedure: CYSTOSCOPY WITH SUPRA PUBIC TUBE CHANGE, BOTOX  200 UNITS, RETROGRADE PYELOGRAM BILATERAL. FULGERATION .5 CM-2CM;  Surgeon: Festus Aloe, MD;  [facility];  Service: Urology;  Laterality: N/A;   ESOPHAGOGASTRODUODENOSCOPY N/A 12/02/2017   Procedure: ESOPHAGOGASTRODUODENOSCOPY (EGD);  Surgeon: Ronnette Juniper, MD;  Location: [facility];  Service: Gastroenterology;  Laterality: N/A;   ROBOTIC ASSISTED TOTAL HYSTERECTOMY WITH BILATERAL SALPINGO OOPHERECTOMY Bilateral 12/19/2018   Procedure: XI ROBOTIC ASSISTED TOTAL HYSTERECTOMY (UTERUS  GREATER THAN 250gr) WITH BILATERAL SALPINGO OOPHORECTOMY;  Surgeon: Everitt Amber, MD;  [facility];  Service: Gynecology;  Laterality: Bilateral;   SENTINEL NODE BIOPSY N/A 12/19/2018   Procedure: SENTINEL NODE BIOPSY;  Surgeon: Everitt Amber, MD;  [facility];  Service: Gynecology;  Laterality: N/A;   SUPRAPUBIC CATHETER INSERTION  2009   TONSILLECTOMY     TUBAL LIGATION       Family History: Family History  Problem Relation Age of Onset   Hypertension Mother    Breast cancer Mother        spread all over   Heart disease Father    Diabetes Father    Hypertension Sister    Hypertension Brother    Colon cancer Neg Hx    Esophageal cancer Neg Hx    Rectal cancer Neg Hx     Social History: Social History   Socioeconomic History   Marital status: Single    Spouse name: Not on file   Number of children: 3   Years of education: Not on file   Highest education level: Not on file  Occupational History   Occupation: Disabled  Tobacco Use   Smoking status: Never   Smokeless tobacco: Never  Vaping Use   Vaping Use: Never used  Substance and Sexual Activity   Alcohol use: No   Drug use: No   Sexual activity: Not on file  Other Topics Concern   Not on file  Social History Narrative   Not on file   Social Determinants of Health   Financial Resource Strain: Not on file  Food Insecurity: Not on file  Transportation Needs: Not on file  Physical Activity: Not on file  Stress: Not on file  Social Connections: Not on file    Allergies: No Known Allergies  Outpatient Meds: Current Outpatient Medications  Medication Sig Dispense Refill   atorvastatin (LIPITOR) 10 MG tablet Take 10 mg by mouth at bedtime.      baclofen (LIORESAL) 20 MG tablet TAKE ONE TABLET BY MOUTH THREE TIMES DAILY AND TAKE TWO TABLET BY MOUTH AT BEDTIME 150 tablet 11   cholecalciferol (VITAMIN D3) 25 MCG (1000 UNIT) tablet Take 5,000 Units by mouth daily.     clotrimazole (LOTRIMIN) 1 % cream  Apply to both feet and between toes twice daily for 4 weeks. 60 g 1   furosemide (LASIX) 20 MG tablet TAKE 1 TABLET BY MOUTH ONCE DAILY (Patient taking differently: Take 20 mg by mouth daily.) 90 tablet 3   glipiZIDE (GLUCOTROL) 5 MG tablet Take 1 tablet (5 mg total) by mouth daily before lunch. (Patient taking differently: Take 5 mg by mouth daily.) 90 tablet 3   hydrochlorothiazide (HYDRODIURIL) 25 MG tablet TAKE 1 TABLET BY MOUTH ONCE DAILY EVERY MORNING 90 tablet 0   hydrocortisone 2.5 % cream Place rectally.     metFORMIN (GLUCOPHAGE) 500 MG tablet Take 500 mg by mouth 2 (two) times daily.  3   MOVANTIK 25 MG TABS tablet Take 3 tablets (75 mg total) by mouth daily. (Patient taking differently: Take 75 mg by  mouth every other day. In the morning) 90 tablet 4   NON FORMULARY Power wheelchair repairs  Diagnosis code z99.3 1 each 0   NON FORMULARY Dispense catheter urine drainage bags 90 Bag 3   Ostomy Supplies (NEW IMAGE SKIN/FLANGE/TAPE) MISC 1 application by Does not apply route daily as needed. 30 each 11   Oxycodone HCl 20 MG TABS Take by mouth.     oxyCODONE-acetaminophen (PERCOCET) 10-325 MG tablet Take 1 tablet by mouth every 8 (eight) hours as needed for pain. 90 tablet 0   potassium chloride (MICRO-K) 10 MEQ CR capsule Take 30 mEq by mouth 2 (two) times daily.      prochlorperazine (COMPAZINE) 10 MG tablet Take 1 tablet (10 mg total) by mouth every 12 (twelve) hours. 45 tablet 1   No current facility-administered medications for this visit.      ___________________________________________________________________ Objective   Exam:  BP 120/70    Pulse 94    Ht '5\' 7"'$  (1.702 m)    BMI 45.26 kg/m  Wt Readings from Last 3 Encounters:  04/04/21 289 lb (131.1 kg)  03/18/21 289 lb (131.1 kg)  03/28/20 294 lb (133.4 kg)    General: Pleasant and conversational, no acute distress Eyes: sclera anicteric, no redness ENT: oral mucosa moist without lesions, no cervical or  supraclavicular lymphadenopathy CV: RRR without murmur, S1/S2 Resp: clear to auscultation bilaterally (limited exam, as it is difficult for her to sit forward), normal RR and effort noted GI: soft, no focal tenderness, with active bowel sounds.  Exam limited by body habitus and position She is able to operate a motorized wheelchair, she cannot move her legs.  Alert and oriented, speech fluent  Labs:  Cmp normal at PCP 12/16/21 this is seen in care everywhere)   Assessment: Encounter Diagnoses  Name Primary?   Chronic constipation Yes   Nausea in adult    Abdominal bloating     Longstanding constipation from poor motility due to MS, immobility and opioids.  I believe her bloating and nausea are secondary symptoms from the constipation.  This is a very challenging scenario given her condition.  If she is given too aggressive a bowel regimen, she is likely to have incontinence, which may lead to decubitus ulcers, infection/osteomyelitis and urinary infection.  We also have to be cautious about possible med interactions or side effects given her polypharmacy. She is already on Movantik, making me reluctant a promotility agent such as Motegrity. Chronic neurologic condition, increases concern for using metoclopramide.  Plan: I prescribed as needed Compazine, hoping that might relieve her nausea somewhat.  While she has come to live with constipation, the nausea is presently the most distressing symptom.  Would avoid ondansetron due to chronic constipation.  Perhaps the best that can be achieved is a planned partial cleanout with bisacodyl suppository followed up 30 minutes later by a fleets enema, ideally twice a week if family and caregiver can manage that. [place] was agreeable to that, and my medical assistant will contact her daughter tomorrow with those instructions and asked that she then report back to [place] on how the plan is going.  No current plans for colonoscopy, as it would be  logistically quite challenging and increased risk in this patient.   60 minutes were spent on this encounter (including chart review, history/exam, counseling/coordination of care, and documentation) > 50% of that time was spent on counseling and coordination of care.    Nelida Meuse III  CC: Referring provider noted  above

## 2022-02-10 ENCOUNTER — Other Ambulatory Visit: Payer: Self-pay

## 2022-02-10 ENCOUNTER — Ambulatory Visit (INDEPENDENT_AMBULATORY_CARE_PROVIDER_SITE_OTHER): Payer: Medicare Other | Admitting: Podiatry

## 2022-02-10 ENCOUNTER — Encounter: Payer: Self-pay | Admitting: Podiatry

## 2022-02-10 DIAGNOSIS — B351 Tinea unguium: Secondary | ICD-10-CM | POA: Diagnosis not present

## 2022-02-10 DIAGNOSIS — E0842 Diabetes mellitus due to underlying condition with diabetic polyneuropathy: Secondary | ICD-10-CM

## 2022-02-10 DIAGNOSIS — G35 Multiple sclerosis: Secondary | ICD-10-CM

## 2022-02-15 ENCOUNTER — Telehealth: Payer: Self-pay | Admitting: Gastroenterology

## 2022-02-15 NOTE — Telephone Encounter (Signed)
Tried to reach patient. No answer and no way to leave a voicemail.  ?

## 2022-02-15 NOTE — Telephone Encounter (Signed)
Inbound call from patient wanting to know when can we get in contact with her daughter in regards to her last appt.  Please advise. ?

## 2022-02-17 NOTE — Progress Notes (Signed)
?  Subjective:  ?Patient ID: [name], female    DOB: 1961/12/02,  MRN: [id number] ? ?[name] presents to clinic today for at risk foot care with history of diabetic neuropathy and painful elongated mycotic toenails 1-5 bilaterally which are tender when wearing enclosed shoe gear. Pain is relieved with periodic professional debridement. ? ?Patient states blood glucose was 125 mg/dl today.   ? ?New problem(s): None.  ? ?PCP is [facility], [place] , and last visit was December 16, 2021. ? ?No Known Allergies ? ?Review of Systems: Negative except as noted in the HPI. ? ?Objective: No changes noted in today's physical examination. ?[name] is a pleasant 61 y.o. female in NAD. AAO X 3. ? ?Vascular Examination: ?Capillary refill time to digits immediate b/l. Palpable pedal pulses b/l LE. Pedal hair absent. No pain with calf compression b/l. Dependent edema noted b/l LE. ? ?Dermatological Examination: ?Pedal skin is warm and supple b/l LE. No open wounds b/l LE. No interdigital macerations noted b/l LE. Toenails 1-5 b/l elongated, discolored, dystrophic, thickened, crumbly with subungual debris and tenderness to dorsal palpation. Hyperkeratotic lesion(s) L hallux.  No erythema, no edema, no drainage, no fluctuance. ? ?Musculoskeletal Examination: ?Flaccid lower extremity noted b/l lower extremities. Clawtoe deformity 2-5 bilaterally. Utilizes motorized chair for mobility assistance. ? ?Neurological Examination: ?Protective sensation diminished with 10g monofilament b/l. ? ?Assessment/Plan: ?1. Onychomycosis   ?2. Diabetic polyneuropathy associated with diabetes mellitus due to underlying condition ([place])   ?3. Multiple sclerosis ([place])   ?-No new findings. No new orders. ?-Continue diabetic foot care principles: inspect feet daily, monitor glucose as recommended by PCP and/or Endocrinologist, and follow prescribed diet per PCP, Endocrinologist and/or dietician. ?-Toenails 1-5 b/l were debrided in length and girth with  sterile nail nippers and dremel without iatrogenic bleeding.  ?-Patient/POA to call should there be question/concern in the interim.  ? ?Return in about 3 months (around 05/13/2022). ? ?Marzetta Board, DPM  ?

## 2022-02-22 NOTE — Telephone Encounter (Signed)
I spoke to the daughter of [name]. She is aware of the requests made by Dr Loletha Carrow. She said she is having trouble just getting the schedule worked out with her mom to be able to proceed. Advised her to reach out to [place] if she had any questions.   ?

## 2022-02-22 NOTE — Telephone Encounter (Signed)
No answe on phone number listed in Epic. Phone just rings, no answer and cant leave a voicemail.  ?

## 2022-03-17 DIAGNOSIS — K6289 Other specified diseases of anus and rectum: Secondary | ICD-10-CM | POA: Insufficient documentation

## 2022-03-22 ENCOUNTER — Other Ambulatory Visit: Payer: Self-pay

## 2022-03-22 MED ORDER — BACLOFEN 20 MG PO TABS: ORAL_TABLET | ORAL | 11 refills | Status: DC

## 2022-05-01 ENCOUNTER — Other Ambulatory Visit: Payer: Self-pay

## 2022-05-01 ENCOUNTER — Encounter (HOSPITAL_COMMUNITY): Payer: Self-pay | Admitting: Pulmonary Disease

## 2022-05-01 ENCOUNTER — Inpatient Hospital Stay (HOSPITAL_COMMUNITY)
Admission: EM | Admit: 2022-05-01 | Discharge: 2022-05-02 | DRG: 916 | Disposition: A | Discharge status: Home / Self Care | Payer: Medicare Other | Attending: Pulmonary Disease | Admitting: Pulmonary Disease

## 2022-05-01 DIAGNOSIS — E1142 Type 2 diabetes mellitus with diabetic polyneuropathy: Secondary | ICD-10-CM | POA: Diagnosis present

## 2022-05-01 DIAGNOSIS — Z993 Dependence on wheelchair: Secondary | ICD-10-CM | POA: Diagnosis not present

## 2022-05-01 DIAGNOSIS — E782 Mixed hyperlipidemia: Secondary | ICD-10-CM | POA: Diagnosis present

## 2022-05-01 DIAGNOSIS — T783XXA Angioneurotic edema, initial encounter: Secondary | ICD-10-CM | POA: Diagnosis present

## 2022-05-01 DIAGNOSIS — K219 Gastro-esophageal reflux disease without esophagitis: Secondary | ICD-10-CM | POA: Diagnosis present

## 2022-05-01 DIAGNOSIS — Z79899 Other long term (current) drug therapy: Secondary | ICD-10-CM | POA: Diagnosis not present

## 2022-05-01 DIAGNOSIS — Z6841 Body Mass Index (BMI) 40.0 and over, adult: Secondary | ICD-10-CM

## 2022-05-01 DIAGNOSIS — Z8542 Personal history of malignant neoplasm of other parts of uterus: Secondary | ICD-10-CM

## 2022-05-01 DIAGNOSIS — T464X5A Adverse effect of angiotensin-converting-enzyme inhibitors, initial encounter: Secondary | ICD-10-CM | POA: Diagnosis present

## 2022-05-01 DIAGNOSIS — Y929 Unspecified place or not applicable: Secondary | ICD-10-CM | POA: Diagnosis not present

## 2022-05-01 DIAGNOSIS — K5901 Slow transit constipation: Secondary | ICD-10-CM

## 2022-05-01 DIAGNOSIS — Z923 Personal history of irradiation: Secondary | ICD-10-CM | POA: Diagnosis not present

## 2022-05-01 DIAGNOSIS — I10 Essential (primary) hypertension: Secondary | ICD-10-CM | POA: Diagnosis present

## 2022-05-01 DIAGNOSIS — Z888 Allergy status to other drugs, medicaments and biological substances status: Secondary | ICD-10-CM

## 2022-05-01 DIAGNOSIS — Z7984 Long term (current) use of oral hypoglycemic drugs: Secondary | ICD-10-CM

## 2022-05-01 DIAGNOSIS — G35 Multiple sclerosis: Secondary | ICD-10-CM | POA: Diagnosis present

## 2022-05-01 DIAGNOSIS — L8990 Pressure ulcer of unspecified site, unspecified stage: Secondary | ICD-10-CM | POA: Insufficient documentation

## 2022-05-01 DIAGNOSIS — T783XXD Angioneurotic edema, subsequent encounter: Principal | ICD-10-CM

## 2022-05-01 LAB — TYPE AND SCREEN
ABO/RH(D): O POS
Antibody Screen: NEGATIVE

## 2022-05-01 LAB — GLUCOSE, CAPILLARY
Glucose-Capillary: 207 mg/dL — ABNORMAL HIGH (ref 70–99)
Glucose-Capillary: 289 mg/dL — ABNORMAL HIGH (ref 70–99)

## 2022-05-01 LAB — BASIC METABOLIC PANEL
Anion gap: 11 (ref 5–15)
BUN: 22 mg/dL (ref 8–23)
CO2: 23 mmol/L (ref 22–32)
Calcium: 9.5 mg/dL (ref 8.9–10.3)
Chloride: 103 mmol/L (ref 98–111)
Creatinine, Ser: 1.08 mg/dL — ABNORMAL HIGH (ref 0.44–1.00)
GFR, Estimated: 58 mL/min — ABNORMAL LOW (ref >60)
Glucose, Bld: 114 mg/dL — ABNORMAL HIGH (ref 70–99)
Potassium: 4.6 mmol/L (ref 3.5–5.1)
Sodium: 137 mmol/L (ref 135–145)

## 2022-05-01 LAB — CBC WITH DIFFERENTIAL/PLATELET
Abs Immature Granulocytes: 0.11 10*3/uL — ABNORMAL HIGH (ref 0.00–0.07)
Basophils Absolute: 0 10*3/uL (ref 0.0–0.1)
Basophils Relative: 0 %
Eosinophils Absolute: 0.7 10*3/uL — ABNORMAL HIGH (ref 0.0–0.5)
Eosinophils Relative: 6 %
HCT: 34.7 % — ABNORMAL LOW (ref 36.0–46.0)
Hemoglobin: 10 g/dL — ABNORMAL LOW (ref 12.0–15.0)
Immature Granulocytes: 1 %
Lymphocytes Relative: 38 %
Lymphs Abs: 4.3 10*3/uL — ABNORMAL HIGH (ref 0.7–4.0)
MCH: 25.3 pg — ABNORMAL LOW (ref 26.0–34.0)
MCHC: 28.8 g/dL — ABNORMAL LOW (ref 30.0–36.0)
MCV: 87.6 fL (ref 80.0–100.0)
Monocytes Absolute: 0.9 10*3/uL (ref 0.1–1.0)
Monocytes Relative: 8 %
Neutro Abs: 5.3 10*3/uL (ref 1.7–7.7)
Neutrophils Relative %: 47 %
Platelets: 528 10*3/uL — ABNORMAL HIGH (ref 150–400)
RBC: 3.96 MIL/uL (ref 3.87–5.11)
RDW: 15.9 % — ABNORMAL HIGH (ref 11.5–15.5)
WBC: 11.2 10*3/uL — ABNORMAL HIGH (ref 4.0–10.5)
nRBC: 0 % (ref 0.0–0.2)

## 2022-05-01 LAB — MRSA NEXT GEN BY PCR, NASAL: MRSA by PCR Next Gen: NOT DETECTED

## 2022-05-01 MED ORDER — METHYLPREDNISOLONE SODIUM SUCC 125 MG IJ SOLR
80.0000 mg | Freq: Two times a day (BID) | INTRAMUSCULAR | Status: DC
  Administered 2022-05-01 – 2022-05-02 (×2): 80 mg via INTRAVENOUS
  Filled 2022-05-01 (×2): qty 2

## 2022-05-01 MED ORDER — CHLORHEXIDINE GLUCONATE CLOTH 2 % EX PADS
6.0000 | MEDICATED_PAD | Freq: Every day | CUTANEOUS | Status: DC
  Administered 2022-05-01: 6 via TOPICAL

## 2022-05-01 MED ORDER — TRANEXAMIC ACID-NACL 1000-0.7 MG/100ML-% IV SOLN
1000.0000 mg | Freq: Once | INTRAVENOUS | Status: AC
  Administered 2022-05-01: 1000 mg via INTRAVENOUS
  Filled 2022-05-01: qty 100

## 2022-05-01 MED ORDER — FENTANYL CITRATE (PF) 100 MCG/2ML IJ SOLN
25.0000 ug | INTRAMUSCULAR | Status: DC | PRN
  Administered 2022-05-01 – 2022-05-02 (×3): 25 ug via INTRAVENOUS
  Filled 2022-05-01 (×3): qty 2

## 2022-05-01 MED ORDER — INSULIN ASPART 100 UNIT/ML IJ SOLN
0.0000 [IU] | INTRAMUSCULAR | Status: DC
  Administered 2022-05-01: 11 [IU] via SUBCUTANEOUS
  Administered 2022-05-01: 7 [IU] via SUBCUTANEOUS
  Administered 2022-05-02 (×2): 4 [IU] via SUBCUTANEOUS
  Administered 2022-05-02: 7 [IU] via SUBCUTANEOUS
  Administered 2022-05-02: 3 [IU] via SUBCUTANEOUS
  Administered 2022-05-02: 4 [IU] via SUBCUTANEOUS
  Filled 2022-05-01: qty 0.2

## 2022-05-01 MED ORDER — FENTANYL CITRATE PF 50 MCG/ML IJ SOSY
PREFILLED_SYRINGE | INTRAMUSCULAR | Status: AC
  Filled 2022-05-01: qty 2

## 2022-05-01 MED ORDER — RACEPINEPHRINE HCL 2.25 % IN NEBU
0.5000 mL | INHALATION_SOLUTION | Freq: Once | RESPIRATORY_TRACT | Status: AC
  Administered 2022-05-01: 0.5 mL via RESPIRATORY_TRACT
  Filled 2022-05-01: qty 0.5

## 2022-05-01 MED ORDER — ETOMIDATE 2 MG/ML IV SOLN
INTRAVENOUS | Status: AC
  Filled 2022-05-01: qty 10

## 2022-05-01 MED ORDER — FAMOTIDINE IN NACL 20-0.9 MG/50ML-% IV SOLN
20.0000 mg | Freq: Once | INTRAVENOUS | Status: AC
  Administered 2022-05-01: 20 mg via INTRAVENOUS
  Filled 2022-05-01: qty 50

## 2022-05-01 MED ORDER — POLYETHYLENE GLYCOL 3350 17 G PO PACK: 17.0000 g | PACK | Freq: Every day | ORAL | Status: DC | PRN

## 2022-05-01 MED ORDER — TRANEXAMIC ACID-NACL 1000-0.7 MG/100ML-% IV SOLN: 1000.0000 mg | INTRAVENOUS | Status: DC

## 2022-05-01 MED ORDER — LEVETIRACETAM IN NACL 500 MG/100ML IV SOLN
500.0000 mg | Freq: Two times a day (BID) | INTRAVENOUS | Status: DC
  Administered 2022-05-01 – 2022-05-02 (×2): 500 mg via INTRAVENOUS
  Filled 2022-05-01 (×4): qty 100

## 2022-05-01 MED ORDER — CHLORHEXIDINE GLUCONATE 0.12 % MT SOLN
15.0000 mL | Freq: Two times a day (BID) | OROMUCOSAL | Status: DC
  Administered 2022-05-02: 15 mL via OROMUCOSAL
  Filled 2022-05-01 (×3): qty 15

## 2022-05-01 MED ORDER — DIPHENHYDRAMINE HCL 50 MG/ML IJ SOLN
50.0000 mg | Freq: Once | INTRAMUSCULAR | Status: AC
  Administered 2022-05-01: 50 mg via INTRAVENOUS
  Filled 2022-05-01: qty 1

## 2022-05-01 MED ORDER — FAMOTIDINE IN NACL 20-0.9 MG/50ML-% IV SOLN
20.0000 mg | Freq: Two times a day (BID) | INTRAVENOUS | Status: DC
  Administered 2022-05-01 – 2022-05-02 (×3): 20 mg via INTRAVENOUS
  Filled 2022-05-01 (×4): qty 50

## 2022-05-01 MED ORDER — METHYLPREDNISOLONE SODIUM SUCC 125 MG IJ SOLR
125.0000 mg | Freq: Once | INTRAMUSCULAR | Status: AC
  Administered 2022-05-01: 125 mg via INTRAVENOUS
  Filled 2022-05-01: qty 2

## 2022-05-01 MED ORDER — MIDAZOLAM HCL 2 MG/2ML IJ SOLN
INTRAMUSCULAR | Status: AC
  Filled 2022-05-01: qty 2

## 2022-05-01 MED ORDER — ETOMIDATE 2 MG/ML IV SOLN
INTRAVENOUS | Status: AC
  Filled 2022-05-01: qty 20

## 2022-05-01 MED ORDER — ENOXAPARIN SODIUM 40 MG/0.4ML IJ SOSY
40.0000 mg | PREFILLED_SYRINGE | INTRAMUSCULAR | Status: DC
  Administered 2022-05-01: 40 mg via SUBCUTANEOUS
  Filled 2022-05-01: qty 0.4

## 2022-05-01 MED ORDER — ORAL CARE MOUTH RINSE: 15.0000 mL | Freq: Two times a day (BID) | OROMUCOSAL | Status: DC

## 2022-05-01 MED ORDER — SUCCINYLCHOLINE CHLORIDE 200 MG/10ML IV SOSY
PREFILLED_SYRINGE | INTRAVENOUS | Status: AC
  Filled 2022-05-01: qty 10

## 2022-05-01 MED ORDER — ROCURONIUM BROMIDE 10 MG/ML (PF) SYRINGE
PREFILLED_SYRINGE | INTRAVENOUS | Status: AC
  Filled 2022-05-01: qty 10

## 2022-05-01 MED ORDER — DIPHENHYDRAMINE HCL 50 MG/ML IJ SOLN
25.0000 mg | Freq: Four times a day (QID) | INTRAMUSCULAR | Status: DC
  Administered 2022-05-01 – 2022-05-02 (×5): 25 mg via INTRAVENOUS
  Filled 2022-05-01 (×6): qty 1

## 2022-05-01 MED ORDER — KETAMINE HCL 50 MG/5ML IJ SOSY
PREFILLED_SYRINGE | INTRAMUSCULAR | Status: AC
  Filled 2022-05-01: qty 5

## 2022-05-01 MED ORDER — DOCUSATE SODIUM 100 MG PO CAPS: 100.0000 mg | ORAL_CAPSULE | Freq: Two times a day (BID) | ORAL | Status: DC | PRN

## 2022-05-01 NOTE — ED Triage Notes (Signed)
Pt arrives via [facility] from home for angioedema that started this morning. Pt is on lisinopril daily. Pt given 2 IM epi injections PTA. Pt self administered 50 mg benadryl PO. On arrival pt is receiving 5 mg nebulized albuterol. Noticeable swelling to lower face. Pt is bed bound at baseline. Foley catheter in place.

## 2022-05-01 NOTE — H&P (Signed)
NAME:  [name], MRN:  [id number], DOB:  1961/02/21, LOS: 0 ADMISSION DATE:  05/01/2022, CONSULTATION DATE:  05/01/2022 REFERRING MD:  Oris Drone MD, CHIEF COMPLAINT:  Angioedema   History of Present Illness:   61 year old with history of hypertension, endometrial cancer, GERD, hypertension, multiple sclerosis.  Presenting with angioedema presenting as acute onset swelling of the lower lip and tongue.  She woke up with the symptoms.  Started on lisinopril about 8 months ago by her primary care but she cannot tell me if she is taking it on a regular basis.  Denies any dyspnea, stridor.  She has history of multiple sclerosis and is wheelchair-bound.  Given epi injection, Solu-Medrol, Benadryl, Pepcid and epi nebs and albuterol nebs.  Anesthesia consulted by ED who felt that there is no acute airway issue.  PCCM consulted for admission and management.  Pertinent  Medical History    has a past medical history of Anemia, Arthritis, Decubitus ulcers, Diabetes mellitus without complication ([place]), Diabetic polyneuropathy ([place]), Endometrial cancer ([place]), Fibroids, GERD (gastroesophageal reflux disease), Heartburn, History of radiation therapy (03/20/2019), History of radiation therapy (04/11/2019), Hypertension, Mixed hyperlipidemia, Morbid obesity ([place]), and MS (multiple sclerosis) ([place]).   Significant Hospital Events: Including procedures, antibiotic start and stop dates in addition to other pertinent events     Interim History / Subjective:    Objective   Blood pressure 139/76, pulse 88, temperature 98.1 F (36.7 C), temperature source Oral, resp. rate 19, height '5\' 7"'$  (1.702 m), weight 131.1 kg, SpO2 100 %.       No intake or output data in the 24 hours ending 05/01/22 1245 Filed Weights   05/01/22 1156  Weight: 131.1 kg    Examination: Gen:      No acute distress HEENT:  EOMI, sclera anicteric, lower lip and tongue are edematous.   Neck:     No masses; no thyromegaly Lungs:    Clear to  auscultation bilaterally; normal respiratory effort CV:         Regular rate and rhythm; no murmurs Abd:      + bowel sounds; soft, non-tender; no palpable masses, no distension Ext:    No edema; adequate peripheral perfusion Skin:      Warm and dry; no rash Neuro: alert and oriented x 3 Psych: normal mood and affect   Resolved Hospital Problem list     Assessment & Plan:  Acute angioedema Likely secondary to ACE inhibitor although patient cannot give clear history if she is actually taking the medication She is protecting airway for now with no stridor or difficulty swallowing  To ICU for close monitoring Continue steroids, Benadryl and Pepcid She is getting 1 dose of TXA as there is a case reports of benefit in ACEI angioedema IV fluids Keep n.p.o.  Hypertension Hold outpatient medications  Diabetes Sliding scale insulin  Best Practice (right click and "Reselect all SmartList Selections" daily)   Diet/type: NPO DVT prophylaxis: LMWH GI prophylaxis: H2B Lines: N/A Foley:  N/A Code Status:  full code Last date of multidisciplinary goals of care discussion '[]'$   Critical care time:    The patient is critically ill with multiple organ system failure and requires high complexity decision making for assessment and support, frequent evaluation and titration of therapies, advanced monitoring, review of radiographic studies and interpretation of complex data.   Critical Care Time devoted to patient care services, exclusive of separately billable procedures, described in this note is 35 minutes.   Marshell Garfinkel MD [place]  [facility] See Amion for pager  If no response to pager , please call [phone] until 7pm After 7:00 pm call Elink  [phone] 05/01/2022, 1:21 PM

## 2022-05-01 NOTE — ED Provider Notes (Signed)
[facility] DEPT Provider Note   CSN: [id number] Arrival date & time: 05/01/22  1152     History  Chief Complaint  Patient presents with   Angioedema    [name] is a 61 y.o. female.  Patient presents ER chief complaint of tongue swelling lip swelling.  She says she was fine yesterday but she woke up this morning with the symptoms.  She has a history of multiple sclerosis, wheelchair-bound, takes lisinopril daily for the past 7 or 8 months.  Denies any prior similar episodes of facial swelling.  Denies fevers or cough or vomiting or diarrhea.      Home Medications Prior to Admission medications   Medication Sig Start Date End Date Taking? Authorizing Provider  atorvastatin (LIPITOR) 10 MG tablet Take 10 mg by mouth at bedtime.     [provider]  baclofen (LIORESAL) 20 MG tablet TAKE ONE TABLET BY MOUTH THREE TIMES DAILY AND TAKE TWO TABLET BY MOUTH AT BEDTIME 03/22/22   Lomax, Amy, NP  cholecalciferol (VITAMIN D3) 25 MCG (1000 UNIT) tablet Take 5,000 Units by mouth daily.    [provider]  clotrimazole (LOTRIMIN) 1 % cream Apply to both feet and between toes twice daily for 4 weeks. 08/28/20   Marzetta Board, DPM  furosemide (LASIX) 20 MG tablet TAKE 1 TABLET BY MOUTH ONCE DAILY Patient taking differently: Take 20 mg by mouth daily. 10/20/19   Forrest Moron, MD  glipiZIDE (GLUCOTROL) 5 MG tablet Take 1 tablet (5 mg total) by mouth daily before lunch. Patient taking differently: Take 5 mg by mouth daily. 05/11/17   Forrest Moron, MD  hydrochlorothiazide (HYDRODIURIL) 25 MG tablet TAKE 1 TABLET BY MOUTH ONCE DAILY EVERY MORNING 04/21/20   Delia Chimes A, MD  hydrocortisone 2.5 % cream Place rectally. 02/20/19   [provider]  metFORMIN (GLUCOPHAGE) 500 MG tablet Take 500 mg by mouth 2 (two) times daily. 10/17/18   [provider]  MOVANTIK 25 MG TABS tablet Take 3 tablets (75 mg total) by mouth  daily. Patient taking differently: Take 75 mg by mouth every other day. In the morning 01/09/20   Forrest Moron, MD  NON FORMULARY Power wheelchair repairs  Diagnosis code z99.3 04/13/17   Forrest Moron, MD  NON FORMULARY Dispense catheter urine drainage bags 07/19/18   Forrest Moron, MD  Ostomy Supplies (NEW IMAGE SKIN/FLANGE/TAPE) MISC 1 application by Does not apply route daily as needed. 07/19/18   Forrest Moron, MD  Oxycodone HCl 20 MG TABS Take by mouth.    [provider]  oxyCODONE-acetaminophen (PERCOCET) 10-325 MG tablet Take 1 tablet by mouth every 8 (eight) hours as needed for pain. 05/15/20   Forrest Moron, MD  potassium chloride (MICRO-K) 10 MEQ CR capsule Take 30 mEq by mouth 2 (two) times daily.  01/07/20   [provider]  prochlorperazine (COMPAZINE) 10 MG tablet Take 1 tablet (10 mg total) by mouth every 12 (twelve) hours. 02/08/22   Doran Stabler, MD      Allergies    Ace inhibitors and Lisinopril    Review of Systems   Review of Systems  Constitutional:  Negative for fever.  HENT:  Negative for ear pain.   Eyes:  Negative for pain.  Respiratory:  Negative for cough.   Cardiovascular:  Negative for chest pain.  Gastrointestinal:  Negative for abdominal pain.  Genitourinary:  Negative for flank pain.  Musculoskeletal:  Negative for back pain.  Skin:  Negative for rash.  Neurological:  Negative for headaches.   Physical Exam Updated Vital Signs BP 139/76   Pulse 88   Temp 98.1 F (36.7 C) (Oral)   Resp 19   Ht '5\' 7"'$  (1.702 m)   Wt 131.1 kg   SpO2 100%   BMI 45.26 kg/m  Physical Exam Constitutional:      General: She is not in acute distress.    Appearance: Normal appearance.  HENT:     Head: Normocephalic.     Nose: Nose normal.     Mouth/Throat:     Comments: Tongue and lower lip are edematous.  Patient able to open the mouth to about 1 finger length.  Uvula is not visible.  Mallampati score 4.  No sob mandibular or  sublingual tenderness noted. Eyes:     Extraocular Movements: Extraocular movements intact.  Cardiovascular:     Rate and Rhythm: Normal rate.  Pulmonary:     Effort: Pulmonary effort is normal.  Musculoskeletal:        General: Normal range of motion.     Cervical back: Normal range of motion.  Neurological:     General: No focal deficit present.     Mental Status: She is alert. Mental status is at baseline.    ED Results / Procedures / Treatments   Labs (all labs ordered are listed, but only abnormal results are displayed) Labs Reviewed  CBC WITH DIFFERENTIAL/PLATELET  BASIC METABOLIC PANEL  TYPE AND SCREEN    EKG None  Radiology No results found.  Procedures .Critical Care Performed by: Luna Fuse, MD Authorized by: Luna Fuse, MD   Critical care provider statement:    Critical care time (minutes):  40   Critical care time was exclusive of:  Separately billable procedures and treating other patients and teaching time   Critical care was necessary to treat or prevent imminent or life-threatening deterioration of the following conditions:  Circulatory failure Comments:     Angioedema of airway.    Medications Ordered in ED Medications  famotidine (PEPCID) IVPB 20 mg premix (20 mg Intravenous New Bag/Given 05/01/22 1219)  succinylcholine (ANECTINE) 200 MG/10ML syringe (has no administration in time range)  etomidate (AMIDATE) 2 MG/ML injection (has no administration in time range)  rocuronium bromide 100 MG/10ML SOSY (has no administration in time range)  midazolam (VERSED) 2 MG/2ML injection (has no administration in time range)  fentaNYL (SUBLIMAZE) 50 MCG/ML injection (has no administration in time range)  ketamine HCl 50 MG/5ML SOSY (has no administration in time range)  etomidate (AMIDATE) 2 MG/ML injection (has no administration in time range)  diphenhydrAMINE (BENADRYL) injection 50 mg (50 mg Intravenous Given 05/01/22 1216)  methylPREDNISolone  sodium succinate (SOLU-MEDROL) 125 mg/2 mL injection 125 mg (125 mg Intravenous Given 05/01/22 1214)  Racepinephrine HCl 2.25 % nebulizer solution 0.5 mL (0.5 mLs Nebulization Given 05/01/22 1230)    ED Course/ Medical Decision Making/ A&P                           Medical Decision Making Amount and/or Complexity of Data Reviewed Labs: ordered.  Risk Prescription drug management.   Cardiac monitoring showing sinus rhythm.  Chart review shows office visit March 17, 2022 for wellness exam.  I was called immediately to the patient's bedside given her presentation of angioedema and potential airway compromise.  She has significant oral swelling.  Peripheral line  was placed, patient given 50 mg of Benadryl, 125 mg Solu-Medrol.  She had already received 2 epinephrine IM doses and 50 mg of Benadryl oral medication this morning.  Also given Pepcid 20 mg IV.  Consultation with anesthesiology who came down immediately and assessed the patient.  Her airway does appear to be improving per patient.  She is able to manage her secretions no tripoding noted.  We will continue close observation versus airway intervention.  Consultation with ICU for admission.          Final Clinical Impression(s) / ED Diagnoses Final diagnoses:  Angioedema, initial encounter    Rx / DC Orders ED Discharge Orders     None         Luna Fuse, MD 05/01/22 1235

## 2022-05-01 NOTE — H&P (Incomplete Revision)
NAME:  [name], MRN:  [id number], DOB:  12-14-1960, LOS: 0 ADMISSION DATE:  05/01/2022, CONSULTATION DATE:  05/01/2022 REFERRING MD:  Oris Drone MD, CHIEF COMPLAINT:  Angioedema   History of Present Illness:   61 year old with history of hypertension, endometrial cancer, GERD, hypertension, multiple sclerosis.  Presenting with angioedema presenting as acute onset swelling of the lower lip and tongue.  She woke up with the symptoms.  Started on lisinopril about 8 months ago by her primary care but she cannot tell me if she is taking it on a regular basis.  Denies any dyspnea, stridor.  She has history of multiple sclerosis and is wheelchair-bound.  Given epi injection, Solu-Medrol, Benadryl, Pepcid and epi nebs and albuterol nebs.  Anesthesia consulted by ED who felt that there is no acute airway issue.  PCCM consulted for admission and management.  Pertinent  Medical History    has a past medical history of Anemia, Arthritis, Decubitus ulcers, Diabetes mellitus without complication ([place]), Diabetic polyneuropathy ([place]), Endometrial cancer ([place]), Fibroids, GERD (gastroesophageal reflux disease), Heartburn, History of radiation therapy (03/20/2019), History of radiation therapy (04/11/2019), Hypertension, Mixed hyperlipidemia, Morbid obesity ([place]), and MS (multiple sclerosis) ([place]).   Significant Hospital Events: Including procedures, antibiotic start and stop dates in addition to other pertinent events     Interim History / Subjective:    Objective   Blood pressure 139/76, pulse 88, temperature 98.1 F (36.7 C), temperature source Oral, resp. rate 19, height '5\' 7"'$  (1.702 m), weight 131.1 kg, SpO2 100 %.       No intake or output data in the 24 hours ending 05/01/22 1245 Filed Weights   05/01/22 1156  Weight: 131.1 kg    Examination: Blood pressure (!) 136/59, pulse 70, temperature 98.1 F (36.7 C), temperature source Oral, resp. rate 12, height '5\' 7"'$  (1.702 m), weight 131 kg, SpO2 96  %. Gen:      No acute distress HEENT:  EOMI, sclera anicteric Neck:     No masses; no thyromegaly, improved swelling Lungs:    Clear to auscultation bilaterally; normal respiratory effort CV:         Regular rate and rhythm; no murmurs Abd:      + bowel sounds; soft, non-tender; no palpable masses, no distension Ext:    No edema; adequate peripheral perfusion Skin:      Warm and dry; no rash Neuro: alert and oriented x 3 Psych: normal mood and affect   Resolved Hospital Problem list     Assessment & Plan:  Acute angioedema Likely secondary to ACE inhibitor although patient cannot give clear history if she is actually taking the medication She is protecting airway for now with no stridor or difficulty swallowing  Continue steroids, Benadryl and Pepcid IV fluids Keep n.p.o.  Hypertension Hold outpatient medications  Diabetes Sliding scale insulin  Best Practice (right click and "Reselect all SmartList Selections" daily)   Diet/type: NPO DVT prophylaxis: LMWH GI prophylaxis: H2B Lines: N/A Foley:  N/A Code Status:  full code Last date of multidisciplinary goals of care discussion '[]'$   Critical care time:    The patient is critically ill with multiple organ system failure and requires high complexity decision making for assessment and support, frequent evaluation and titration of therapies, advanced monitoring, review of radiographic studies and interpretation of complex data.   Critical Care Time devoted to patient care services, exclusive of separately billable procedures, described in this note is 35 minutes.   Marshell Garfinkel MD [facility] See Amion for pager  If no response to pager , please call [phone] until 7pm After 7:00 pm call Elink  [phone] 05/01/2022, 1:21 PM

## 2022-05-02 DIAGNOSIS — T783XXA Angioneurotic edema, initial encounter: Secondary | ICD-10-CM | POA: Diagnosis not present

## 2022-05-02 DIAGNOSIS — L8990 Pressure ulcer of unspecified site, unspecified stage: Secondary | ICD-10-CM | POA: Insufficient documentation

## 2022-05-02 LAB — GLUCOSE, CAPILLARY
Glucose-Capillary: 130 mg/dL — ABNORMAL HIGH (ref 70–99)
Glucose-Capillary: 155 mg/dL — ABNORMAL HIGH (ref 70–99)
Glucose-Capillary: 170 mg/dL — ABNORMAL HIGH (ref 70–99)
Glucose-Capillary: 186 mg/dL — ABNORMAL HIGH (ref 70–99)
Glucose-Capillary: 222 mg/dL — ABNORMAL HIGH (ref 70–99)

## 2022-05-02 LAB — BASIC METABOLIC PANEL
Anion gap: 9 (ref 5–15)
BUN: 25 mg/dL — ABNORMAL HIGH (ref 8–23)
CO2: 23 mmol/L (ref 22–32)
Calcium: 9.2 mg/dL (ref 8.9–10.3)
Chloride: 105 mmol/L (ref 98–111)
Creatinine, Ser: 0.96 mg/dL (ref 0.44–1.00)
GFR, Estimated: >60 mL/min (ref >60)
Glucose, Bld: 184 mg/dL — ABNORMAL HIGH (ref 70–99)
Potassium: 4.5 mmol/L (ref 3.5–5.1)
Sodium: 137 mmol/L (ref 135–145)

## 2022-05-02 LAB — CBC
HCT: 29.6 % — ABNORMAL LOW (ref 36.0–46.0)
Hemoglobin: 8.8 g/dL — ABNORMAL LOW (ref 12.0–15.0)
MCH: 25.6 pg — ABNORMAL LOW (ref 26.0–34.0)
MCHC: 29.7 g/dL — ABNORMAL LOW (ref 30.0–36.0)
MCV: 86 fL (ref 80.0–100.0)
Platelets: 452 10*3/uL — ABNORMAL HIGH (ref 150–400)
RBC: 3.44 MIL/uL — ABNORMAL LOW (ref 3.87–5.11)
RDW: 15.7 % — ABNORMAL HIGH (ref 11.5–15.5)
WBC: 10.7 10*3/uL — ABNORMAL HIGH (ref 4.0–10.5)
nRBC: 0 % (ref 0.0–0.2)

## 2022-05-02 LAB — HEMOGLOBIN A1C
Hgb A1c MFr Bld: 6.9 % — ABNORMAL HIGH (ref 4.8–5.6)
Mean Plasma Glucose: 151.33 mg/dL

## 2022-05-02 MED ORDER — MOVANTIK 25 MG PO TABS: 50.0000 mg | ORAL_TABLET | ORAL | 0 refills | Status: AC

## 2022-05-02 MED ORDER — PREDNISONE 10 MG PO TABS: 40.0000 mg | ORAL_TABLET | Freq: Every day | ORAL | 0 refills | Status: AC

## 2022-05-02 NOTE — TOC CM/SW Note (Signed)
  Transition of Care [facility] Screening Note   Patient Details  Name: [name] Date of Birth: Aug 29, 1961   Transition of Care [facility]) CM/SW Contact:    Ross Ludwig, LCSW Phone Number: 05/02/2022, 5:55 PM    Transition of Care Department [facility]) has reviewed patient and no TOC needs have been identified at this time. We will continue to monitor patient advancement through interdisciplinary progression rounds. If new patient transition needs arise, please place a TOC consult.

## 2022-05-02 NOTE — Discharge Summary (Signed)
Physician Discharge Summary  Patient ID: [name] MRN: [id number] DOB/AGE: November 02, 1961 61 y.o.  Admit date: 05/01/2022 Discharge date: 05/02/2022  Admission Diagnoses: Angioedema  Discharge Diagnoses:  Principal Problem:   Angioedema of lips Active Problems:   Pressure injury of skin   Discharged Condition: good  Hospital Course:  61 year old with history of hypertension, endometrial cancer, GERD, hypertension, multiple sclerosis.  Presenting with angioedema presenting as acute onset swelling of the lower lip and tongue.  She woke up with the symptoms.  Started on lisinopril about 8 months ago by her primary care but she cannot tell me if she is taking it on a regular basis.  Denies any dyspnea, stridor.   She has history of multiple sclerosis and is wheelchair-bound.  Given epi injection, Solu-Medrol, Benadryl, Pepcid and epi nebs and albuterol nebs.  Anesthesia consulted by ED who felt that there is no acute airway issue.  PCCM consulted for admission and management.  Her angioedema was felt to be secondary to ACE inhibitor although patient cannot give clear history if she is actually taking the medication She was continued on steroids, Benadryl and Pepcid and got 1 dose of tranexamic acid.  Overnight she improved and her tongue and lip swelling resolved and she is back to normal. She is talking on the phone and eating.  She is anxious to go home.  We will give her 3 more days of prednisone.  She will monitor her blood sugars at home.  She will also follow-up with primary care to discuss alternate hypertension medications for  Consults: pulmonary/intensive care and anesthesia    Discharge Exam: Blood pressure (!) 147/61, pulse 75, temperature 99.1 F (37.3 C), temperature source Oral, resp. rate 17, height '5\' 7"'$  (1.702 m), weight 131 kg, SpO2 97 %. Blood pressure (!) 170/137, pulse (!) 103, temperature 99.1 F (37.3 C), temperature source Oral, resp. rate (!) 21, height '5\' 7"'$   (1.702 m), weight 131 kg, SpO2 90 %. Gen:      No acute distress HEENT:  EOMI, sclera anicteric Neck:     No masses; no thyromegaly Lungs:    Clear to auscultation bilaterally; normal respiratory effort CV:         Regular rate and rhythm; no murmurs Abd:      + bowel sounds; soft, non-tender; no palpable masses, no distension Ext:    No edema; adequate peripheral perfusion Skin:      Warm and dry; no rash Neuro: alert and oriented x 3 Psych: normal mood and affect   Disposition:    Allergies as of 05/02/2022       Reactions   Ace Inhibitors Other (See Comments)   Angioedema   Lisinopril Other (See Comments)   angioedema        Medication List     STOP taking these medications    lisinopril 2.5 MG tablet Commonly known as: ZESTRIL       TAKE these medications    atorvastatin 20 MG tablet Commonly known as: LIPITOR Take 20 mg by mouth every evening.   baclofen 20 MG tablet Commonly known as: LIORESAL TAKE ONE TABLET BY MOUTH THREE TIMES DAILY AND TAKE TWO TABLET BY MOUTH AT BEDTIME What changed:  how much to take how to take this when to take this additional instructions   clotrimazole 1 % cream Commonly known as: LOTRIMIN Apply to both feet and between toes twice daily for 4 weeks.   furosemide 20 MG tablet Commonly known as: LASIX TAKE 1  TABLET BY MOUTH ONCE DAILY   glipiZIDE 5 MG tablet Commonly known as: GLUCOTROL Take 1 tablet (5 mg total) by mouth daily before lunch. What changed: when to take this   hydrochlorothiazide 25 MG tablet Commonly known as: HYDRODIURIL TAKE 1 TABLET BY MOUTH ONCE DAILY EVERY MORNING   levETIRAcetam 500 MG tablet Commonly known as: KEPPRA Take 500 mg by mouth 2 (two) times daily.   metFORMIN 1000 MG tablet Commonly known as: GLUCOPHAGE Take 1,000 mg by mouth 2 (two) times daily.   Movantik 25 MG Tabs tablet Generic drug: naloxegol oxalate Take 2 tablets (50 mg total) by mouth every other day. In the  morning   New Image Skin/Flange/Tape Misc 1 application by Does not apply route daily as needed.   NON FORMULARY Power wheelchair repairs  Diagnosis code z99.3   NON FORMULARY Dispense catheter urine drainage bags   oxyCODONE-acetaminophen 10-325 MG tablet Commonly known as: PERCOCET Take 1 tablet by mouth every 8 (eight) hours as needed for pain. What changed: when to take this   potassium chloride 10 MEQ CR capsule Commonly known as: MICRO-K Take 30 mEq by mouth 2 (two) times daily.   predniSONE 10 MG tablet Commonly known as: DELTASONE Take 4 tablets (40 mg total) by mouth daily for 3 days.   prochlorperazine 10 MG tablet Commonly known as: COMPAZINE Take 1 tablet (10 mg total) by mouth every 12 (twelve) hours.   VITAMIN D3 PO Take 5,000 Units by mouth daily.         Signed:  Marshell Garfinkel MD [facility] 05/02/2022, 5:17 PM

## 2022-05-04 ENCOUNTER — Telehealth: Payer: Self-pay | Admitting: Pulmonary Disease

## 2022-05-04 ENCOUNTER — Ambulatory Visit: Payer: Medicare Other | Admitting: Physical Therapy

## 2022-05-04 NOTE — Telephone Encounter (Signed)
Called pt to verify her PCP- there was no answer and no option to leave msg, will call back

## 2022-05-04 NOTE — Telephone Encounter (Signed)
Spoke with the pharmacist. She is checking for clarification on rx:  MOVANTIK 25 MG TABS tablet 30 tablet 0 05/02/2022 06/01/2022   Sig - Route: Take 2 tablets (50 mg total) by mouth every other day. In the morning - Oral   Sent to pharmacy as: [place] 25 MG Tab tablet   Notes to Pharmacy: Corrected frequency to every day   E-Prescribing Status: Receipt confirmed by pharmacy (05/02/2022  5:16 PM EDT)     Is it 2 daily or should it be 2 every other day?  Dr Vaughan Browner off today to routing to provider of the day. Please advise, thanks!

## 2022-05-04 NOTE — Telephone Encounter (Signed)
In January 2023 neurology notes indicate she was taking Movantik - MOVANTIK 25 MG TABS tablet, Take 3 tablets (75 mg total) by mouth daily. (Patient taking differently: Take 75 mg by mouth every other day. In the morning),   She was discharged from the hospitalt 2 days ago 05/02/2022 with a different set of instructions - Movantik 25 MG Tabs tablet Generic drug: naloxegol oxalate Take 2 tablets (50 mg total) by mouth every other day. In the morning   She was admitted for angioedema from ACE inhibitor.  I suspect Dr. Vaughan Browner instructions was just the same as what she was taking when she came into the hospital on May 01, 2022  Therefore this is best addressed by the primary care physician or whoever originally prescribed Movantik.  Otherwise it has to wait till Dr. Vaughan Browner returns.  Or she needs video visit with one of our nurse practitioners to sort this out because this is related to opioids

## 2022-05-06 NOTE — Telephone Encounter (Signed)
Called and spoke with pt letting her know the info per MR about her movantik and she verbalized understanding. Nothing further needed.

## 2022-05-17 ENCOUNTER — Ambulatory Visit: Payer: Medicare Other | Admitting: Podiatry

## 2022-05-19 ENCOUNTER — Ambulatory Visit: Payer: Medicare Other | Admitting: Physical Therapy

## 2022-05-21 ENCOUNTER — Ambulatory Visit (INDEPENDENT_AMBULATORY_CARE_PROVIDER_SITE_OTHER): Payer: Medicare Other | Admitting: Podiatry

## 2022-05-21 DIAGNOSIS — G35 Multiple sclerosis: Secondary | ICD-10-CM

## 2022-05-21 DIAGNOSIS — B351 Tinea unguium: Secondary | ICD-10-CM

## 2022-05-21 DIAGNOSIS — E0842 Diabetes mellitus due to underlying condition with diabetic polyneuropathy: Secondary | ICD-10-CM | POA: Diagnosis not present

## 2022-05-26 ENCOUNTER — Ambulatory Visit: Payer: Medicare Other | Attending: Physician Assistant

## 2022-05-26 DIAGNOSIS — M79661 Pain in right lower leg: Secondary | ICD-10-CM | POA: Diagnosis present

## 2022-05-26 DIAGNOSIS — G8929 Other chronic pain: Secondary | ICD-10-CM | POA: Insufficient documentation

## 2022-05-26 DIAGNOSIS — G35 Multiple sclerosis: Secondary | ICD-10-CM | POA: Diagnosis present

## 2022-05-26 DIAGNOSIS — M79662 Pain in left lower leg: Secondary | ICD-10-CM | POA: Insufficient documentation

## 2022-05-26 DIAGNOSIS — M545 Low back pain, unspecified: Secondary | ICD-10-CM | POA: Insufficient documentation

## 2022-05-26 NOTE — Therapy (Signed)
OUTPATIENT PHYSICAL THERAPY NEURO EVALUATION   Patient Name: [name] MRN: [id number] DOB:[birth year], 61 y.o., female Today's Date: 05/26/2022   PCP: Harrison Mons REFERRING PROVIDER: Harrison Mons   PT End of Session - 05/26/22 1313     Visit Number 1    PT Start Time 4098    PT Stop Time 1191    PT Time Calculation (min) 30 min    Behavior During Therapy WFL for tasks assessed/performed             Past Medical History:  Diagnosis Date   Anemia    history of   Arthritis    Decubitus ulcers    currently treated at wound center   Diabetes mellitus without complication ([place])    Diabetic polyneuropathy ([place])    Endometrial cancer ([place])    Grade 2   Fibroids    GERD (gastroesophageal reflux disease)    Heartburn    Occ   History of radiation therapy 03/20/2019   02/14/2019 - 03/20/2019 Pelvis / 45 Gy in 25 fractions  Dr Gery Pray   History of radiation therapy 04/11/2019   03/27/2019, 04/04/2019, 04/11/2019 (HDR, [facility]): Vaginal Cuff (cylinder, 3 cm diameter) / 18 Gy in 3 fractions (3 cm tx length) Dr Gery Pray   Hypertension    Mixed hyperlipidemia    Morbid obesity ([place])    MS (multiple sclerosis) ([place])    Past Surgical History:  Procedure Laterality Date   BRAIN TUMOR EXCISION  09/2020   COLONOSCOPY     CYSTOSCOPY N/A 01/22/2020   Procedure: CYSTOSCOPY WITH SUPRA PUBIC TUBE CHANGE, BOTOX 200 UNITS, RETROGRADE PYELOGRAM BILATERAL. FULGERATION .5 CM-2CM;  Surgeon: Festus Aloe, MD;  Location: [facility];  Service: Urology;  Laterality: N/A;   ESOPHAGOGASTRODUODENOSCOPY N/A 12/02/2017   Procedure: ESOPHAGOGASTRODUODENOSCOPY (EGD);  Surgeon: Ronnette Juniper, MD;  Location: [place];  Service: Gastroenterology;  Laterality: N/A;   ROBOTIC ASSISTED TOTAL HYSTERECTOMY WITH BILATERAL SALPINGO OOPHERECTOMY Bilateral 12/19/2018   Procedure: XI ROBOTIC ASSISTED TOTAL HYSTERECTOMY (UTERUS GREATER THAN 250gr) WITH BILATERAL SALPINGO OOPHORECTOMY;  Surgeon:  Everitt Amber, MD;  Location: [facility];  Service: Gynecology;  Laterality: Bilateral;   SENTINEL NODE BIOPSY N/A 12/19/2018   Procedure: SENTINEL NODE BIOPSY;  Surgeon: Everitt Amber, MD;  Location: [facility];  Service: Gynecology;  Laterality: N/A;   SUPRAPUBIC CATHETER INSERTION  2009   TONSILLECTOMY     TUBAL LIGATION     Patient Active Problem List   Diagnosis Date Noted   Pressure injury of skin 05/02/2022   Angioedema of lips 05/02/2022   Angioedema of intestine due to angiotensin converting enzyme inhibitor (ACE-I) 05/01/2022   Rectal pain 03/17/2022   Ganglion cyst of volar aspect of left wrist 01/01/2022   Ganglion cyst of wrist, right 01/01/2022   Bilateral wrist pain 01/01/2022   Chronic midline low back pain without sciatica 09/10/2021   Meningioma ([place]) 12/17/2020   Cerebral meningioma ([place]) 05/19/2020   Nuclear sclerotic cataract of both eyes 01/23/2020   Endometrial cancer ([place]) 11/21/2018   Morbid obesity ([place]) 11/21/2018   Pain management contract agreement 02/16/2017   Mixed hyperlipidemia 11/24/2016   Type 2 diabetes mellitus with diabetic polyneuropathy, without long-term current use of insulin ([place]) 11/24/2016   Multiple sclerosis ([place]) 11/18/2016   Leg weakness, bilateral 11/18/2016   Dysesthesia 11/18/2016   Diplegia of both lower extremities ([place]) 11/18/2016   Diabetes, polyneuropathy ([place]) 11/18/2016   Chronic indwelling Foley catheter 11/17/2016   Essential hypertension 11/17/2016   Slow transit constipation  11/17/2016   Chronically on opiate therapy 11/17/2016   Physical deconditioning 11/17/2016    ONSET DATE: 04/28/22  REFERRING DIAG: G35 (ICD-10-CM), M54.50, G89.29, R53.81   THERAPY DIAG:  Multiple sclerosis (H[facility])  Chronic low back pain, unspecified back pain laterality, unspecified whether sciatica present  Pain in left lower leg  Pain in right lower leg  Rationale for Evaluation and Treatment Rehabilitation  SUBJECTIVE:                                                                                                                                                                                               SUBJECTIVE STATEMENT: Patient is doing okay. Went to PT a long time ago over 67yr ago to strengthening lower body and legs to help with walking, just a few steps. Has been dealing with pain since 2006 since she was diagnosed with MS.   Pt accompanied by: self  PERTINENT HISTORY: history of hypertension, endometrial cancer, GERD, hypertension, multiple sclerosis  PAIN:  Are you having pain? Yes: NPRS scale: 4/10 Pain location: waist down Pain description: tingling, achy, throbbing Aggravating factors: not really "I am just used to it)" Relieving factors: pain meds every other day   PRECAUTIONS: Fall  WEIGHT BEARING RESTRICTIONS No  FALLS: Has patient fallen in last 6 months? No  LIVING ENVIRONMENT: Lives with: lives with their family Lives in: House/apartment Stairs: No Has following equipment at home: Wheelchair (power)  PLOF: Needs assistance with ADLs  PATIENT GOALS to be able to stand beside bed for a min or less and pivot 3 steps, stop using hoyer lift   OBJECTIVE:   DIAGNOSTIC FINDINGS:  Targeted ultrasound is performed, evaluating the subareolar and retroareolar LEFT breast, showing multiple dilated ducts without internal mass or vascularity, corresponding to the mammographic findings. No solid or cystic mass is identified.  COGNITION: Overall cognitive status: Within functional limits for tasks assessed   SENSATION: WFL  COORDINATION: Normal    MUSCLE LENGTH: Increased tightness in hamstrings, hip flexors     LOWER EXTREMITY ROM:   AROM unable to move LE   Passive  Right Eval Left Eval  Hip flexion    Hip extension    Hip abduction    Hip adduction    Hip internal rotation    Hip external rotation    Knee flexion    Knee extension -5 0  Ankle dorsiflexion    Ankle  plantarflexion    Ankle inversion    Ankle eversion     (Blank rows = not tested)  LOWER EXTREMITY MMT:  unable to move LE at all 0/5  MMT Right Eval Left Eval  Hip flexion    Hip extension    Hip abduction    Hip adduction    Hip internal rotation    Hip external rotation    Knee flexion    Knee extension    Ankle dorsiflexion    Ankle plantarflexion    Ankle inversion    Ankle eversion    (Blank rows = not tested)  BED MOBILITY:  Rolling to Right Modified independence Rolling to Left Modified independence Hoyer lift to get in and out and bed rails to roll   TRANSFERS: Assistive device utilized:  IT consultant TREATMENT:  POC   PATIENT EDUCATION: Education details: POC, referral to neuro  Person educated: Patient Education method: Explanation Education comprehension: verbalized understanding   HOME EXERCISE PROGRAM: TBD  GOALS: Goals reviewed with patient? No  SHORT TERM GOALS: Target date:   Patient will be independent with initial HEP. Goal status: INITIAL   ASSESSMENT:  CLINICAL IMPRESSION: Patient is a 61 y.o. female who was seen today for physical therapy evaluation and treatment for multiple sclerosis and chronic low back and LE pain. She enters in a power wheelchair and present significant deficits in ROM and strength. She is unable to move lower extremities seated in her chair. I passively moved her leg and she says that she needs more of that and for someone to help her stretch which her aide and daughter do sometimes. Patient has not stood up in over 10 years and states transfers are done with a [facility] lift. She has received home health therapy many years ago. She is getting a new chair next Thursday. Patient was told that it would be best to refer her to neuro to be able to use body weight supported equipment that we do not have here at [facility].   OBJECTIVE IMPAIRMENTS decreased mobility, difficulty walking, decreased ROM, decreased  strength, impaired perceived functional ability, impaired UE functional use, obesity, pain, and wheelchair bound .   ACTIVITY LIMITATIONS carrying, lifting, bending, sitting, standing, squatting, stairs, transfers, bed mobility, bathing, toileting, dressing, reach over head, hygiene/grooming, locomotion level, and caring for others  PARTICIPATION LIMITATIONS: cleaning, laundry, driving, shopping, and community activity  PERSONAL FACTORS Past/current experiences, Time since onset of injury/illness/exacerbation, Transportation, and 3+ comorbidities: MS, obesity, DM, HTN, hx of cancer  are also affecting patient's functional outcome.   REHAB POTENTIAL: Fair if she is able to get to a clinic that is better equipped to help her with transfers and bed level exercises   CLINICAL DECISION MAKING: Unstable/unpredictable  EVALUATION COMPLEXITY: High  PLAN: PT FREQUENCY: one time visit  PT DURATION: other: one time visit   PLANNED INTERVENTIONS: Therapeutic exercises, Therapeutic activity, Neuromuscular re-education, Balance training, Gait training, Patient/Family education, Joint mobilization, Wheelchair mobility training, Cryotherapy, Moist heat, Ultrasound, Ionotophoresis '4mg'$ /ml Dexamethasone, and Manual therapy  PLAN FOR NEXT SESSION: N/A   Andris Baumann, PT 05/26/2022, 2:08 PM

## 2022-05-30 ENCOUNTER — Encounter: Payer: Self-pay | Admitting: Podiatry

## 2022-06-09 ENCOUNTER — Telehealth: Payer: Self-pay | Admitting: *Deleted

## 2022-06-09 NOTE — Telephone Encounter (Signed)
CALLED PATIENT TO ASK ABOUT COMING FOR FU APPT. ANOTHER DAY DUE TO DR. KINARD NEEDING EXAM ROOM 1, PATIENT  AGEED TO COME ON 06-21-22 @ 3:45 PM

## 2022-06-14 ENCOUNTER — Ambulatory Visit: Payer: Medicare Other | Admitting: Radiation Oncology

## 2022-06-19 NOTE — Progress Notes (Signed)
Radiation Oncology         ([phone]) [phone] ________________________________  Name: [name] MRN: [id number]  Date: 06/21/2022  DOB: Jan 20, 1961  Follow-Up Visit Note  CC: Harrison Mons, PA  Forrest Moron, MD  No diagnosis found.  Diagnosis: Endometrial cancer grade 2, stage IB, pT1b, pN0(i+)   Interval Since Last Radiation: 3 years, 2 months, and 11 days   1. 02/14/2019 - 03/20/2019 ([facility]): Pelvis / 45 Gy in 25 fractions   2. 03/27/2019, 04/04/2019, 04/11/2019 (HDR, [facility]): Vaginal Cuff (cylinder, 3 cm diameter) / 18 Gy in 3 fractions (3 cm tx length)  Narrative:  The patient returns today for routine annual follow-up, she was last seen here for follow up on 07/06/21. Since her last visit, the patient followed up with Dr. Berline Lopes on 12/18/21. During which time, the patient reported baseline constipation but otherwise denied any symptoms concerning for disease recurrence and was noted as NED on examination.   The patient also had a follow up MRI of the brain performed on 11/17/21 (history of meningioma) which showed no evidence of left frontal meningioma recurrence, as well as interval resolution of a left frontoparietal subdural collection.   Of note: the patient presented to the ED on 05/01/22 with acute onset swelling of the lower lip and tongue. She was given an epi injection, Solu-Medrol, Benadryl, Pepcid and epi nebs and albuterol nebs.  Anesthesia was consulted by ED who felt that there was no acute airway issue, and the patient was admitted over night with resolution of swelling.                                 Allergies:  is allergic to ace inhibitors and lisinopril.  Meds: Current Outpatient Medications  Medication Sig Dispense Refill   ACCU-CHEK GUIDE test strip TEST 1 ONCE DAILY.     atorvastatin (LIPITOR) 10 MG tablet Take 1 tablet by mouth daily.     atorvastatin (LIPITOR) 20 MG tablet Take 20 mg by mouth every evening.     baclofen (LIORESAL) 20 MG tablet TAKE  ONE TABLET BY MOUTH THREE TIMES DAILY AND TAKE TWO TABLET BY MOUTH AT BEDTIME (Patient taking differently: Take 20-60 mg by mouth See admin instructions. Take 20 mg in the morning and 60 mg at night.) 150 tablet 11   Cholecalciferol (VITAMIN D3 PO) Take 5,000 Units by mouth daily.     Cholecalciferol 25 MCG (1000 UT) capsule Take by mouth.     clotrimazole (LOTRIMIN) 1 % cream Apply to both feet and between toes twice daily for 4 weeks. (Patient not taking: Reported on 05/01/2022) 60 g 1   furosemide (LASIX) 20 MG tablet TAKE 1 TABLET BY MOUTH ONCE DAILY (Patient taking differently: Take 20 mg by mouth daily.) 90 tablet 3   glipiZIDE (GLUCOTROL) 5 MG tablet Take 1 tablet (5 mg total) by mouth daily before lunch. (Patient taking differently: Take 5 mg by mouth in the morning and at bedtime.) 90 tablet 3   hydrochlorothiazide (HYDRODIURIL) 25 MG tablet TAKE 1 TABLET BY MOUTH ONCE DAILY EVERY MORNING 90 tablet 0   ibuprofen (ADVIL) 800 MG tablet Take 800 mg by mouth 3 (three) times daily.     levETIRAcetam (KEPPRA) 500 MG tablet Take 500 mg by mouth 2 (two) times daily.     lisinopril (ZESTRIL) 2.5 MG tablet Take 1 tablet by mouth daily.     metFORMIN (GLUCOPHAGE) 1000 MG tablet  Take by mouth.     NON FORMULARY Power wheelchair repairs  Diagnosis code z99.3 1 each 0   NON FORMULARY Dispense catheter urine drainage bags 90 Bag 3   Ostomy Supplies (NEW IMAGE SKIN/FLANGE/TAPE) MISC 1 application by Does not apply route daily as needed. 30 each 11   oxyCODONE (OXY IR/ROXICODONE) 5 MG immediate release tablet oxycodone 5 mg tablet     oxyCODONE-acetaminophen (PERCOCET) 10-325 MG tablet Take 1 tablet by mouth every 8 (eight) hours as needed for pain. (Patient taking differently: Take 1 tablet by mouth 3 (three) times daily as needed for pain.) 90 tablet 0   potassium chloride (MICRO-K) 10 MEQ CR capsule Take 30 mEq by mouth 2 (two) times daily.      prochlorperazine (COMPAZINE) 10 MG tablet Take 1 tablet (10  mg total) by mouth every 12 (twelve) hours. (Patient not taking: Reported on 05/01/2022) 45 tablet 1   promethazine (PHENERGAN) 25 MG tablet Take by mouth.     No current facility-administered medications for this encounter.    Physical Findings: The patient is in no acute distress. Patient is alert and oriented.  vitals were not taken for this visit. .  No significant changes. Lungs are clear to auscultation bilaterally. Heart has regular rate and rhythm. No palpable cervical, supraclavicular, or axillary adenopathy. Abdomen soft, non-tender, normal bowel sounds.  On pelvic examination the external genitalia were unremarkable. A speculum exam was performed. There are no mucosal lesions noted in the vaginal vault. A Pap smear was obtained of the proximal vagina. On bimanual and rectovaginal examination there were no pelvic masses appreciated. ***    Lab Findings: Lab Results  Component Value Date   WBC 10.7 (H) 05/02/2022   HGB 8.8 (L) 05/02/2022   HCT 29.6 (L) 05/02/2022   MCV 86.0 05/02/2022   PLT 452 (H) 05/02/2022    Radiographic Findings: No results found.  Impression: Endometrial cancer grade 2, stage IB, pT1b, pN0(i+)   The patient is recovering from the effects of radiation.  ***  Plan:  ***   *** minutes of total time was spent for this patient encounter, including preparation, face-to-face counseling with the patient and coordination of care, physical exam, and documentation of the encounter. ____________________________________  Blair Promise, PhD, MD  This document serves as a record of services personally performed by Gery Pray, MD. It was created on his behalf by Roney Mans, a trained medical scribe. The creation of this record is based on the scribe's personal observations and the provider's statements to them. This document has been checked and approved by the attending provider.

## 2022-06-21 ENCOUNTER — Ambulatory Visit
Admission: RE | Admit: 2022-06-21 | Discharge: 2022-06-21 | Disposition: A | Discharge status: Home / Self Care | Payer: Medicare Other | Source: Ambulatory Visit | Attending: Radiation Oncology | Admitting: Radiation Oncology

## 2022-06-21 ENCOUNTER — Other Ambulatory Visit: Payer: Self-pay

## 2022-06-21 ENCOUNTER — Ambulatory Visit: Payer: Medicare Other | Admitting: Radiation Oncology

## 2022-06-21 VITALS — BP 109/89 | HR 71 | Temp 97.9°F | Resp 18 | Ht 67.0 in | Wt 282.0 lb

## 2022-06-21 DIAGNOSIS — C541 Malignant neoplasm of endometrium: Secondary | ICD-10-CM

## 2022-06-21 DIAGNOSIS — Z7984 Long term (current) use of oral hypoglycemic drugs: Secondary | ICD-10-CM | POA: Insufficient documentation

## 2022-06-21 DIAGNOSIS — Z923 Personal history of irradiation: Secondary | ICD-10-CM | POA: Diagnosis not present

## 2022-06-21 DIAGNOSIS — Z79899 Other long term (current) drug therapy: Secondary | ICD-10-CM | POA: Insufficient documentation

## 2022-06-21 DIAGNOSIS — Z8542 Personal history of malignant neoplasm of other parts of uterus: Secondary | ICD-10-CM | POA: Diagnosis present

## 2022-06-21 NOTE — Progress Notes (Signed)
[name] is here today for follow up post radiation to the pelvic.  They completed their radiation on: 03/20/2019   Does the patient complain of any of the following:  Pain:4/10 from the waist down Abdominal bloating: no Diarrhea/Constipation: constipation and takes Movantiq and a stool softener. Nausea/Vomiting: no Vaginal Discharge: no Blood in Urine or Stool: no Urinary Issues (dysuria/incomplete emptying/ incontinence/ increased frequency/urgency): has a catheter. Does patient report using vaginal dilator 2-3 times a week and/or sexually active 2-3 weeks: no Post radiation skin changes: no   Additional comments if applicable: None noted.  BP 109/89 (BP Location: Left Wrist, Patient Position: Sitting)   Pulse 71   Temp 97.9 F (36.6 C) (Oral)   Resp 18   Ht '5\' 7"'$  (1.702 m)   Wt 282 lb (127.9 kg)   SpO2 100%   BMI 44.17 kg/m

## 2022-06-23 ENCOUNTER — Other Ambulatory Visit: Payer: Self-pay | Admitting: Gastroenterology

## 2022-06-30 ENCOUNTER — Encounter: Payer: Self-pay | Admitting: Physical Therapy

## 2022-06-30 ENCOUNTER — Telehealth: Payer: Self-pay | Admitting: Physical Therapy

## 2022-06-30 ENCOUNTER — Ambulatory Visit: Payer: Medicare Other | Attending: Physician Assistant | Admitting: Physical Therapy

## 2022-06-30 DIAGNOSIS — M545 Low back pain, unspecified: Secondary | ICD-10-CM | POA: Diagnosis present

## 2022-06-30 DIAGNOSIS — G8929 Other chronic pain: Secondary | ICD-10-CM | POA: Insufficient documentation

## 2022-06-30 DIAGNOSIS — G35 Multiple sclerosis: Secondary | ICD-10-CM | POA: Diagnosis present

## 2022-06-30 DIAGNOSIS — R262 Difficulty in walking, not elsewhere classified: Secondary | ICD-10-CM | POA: Insufficient documentation

## 2022-06-30 NOTE — Therapy (Signed)
OUTPATIENT PHYSICAL THERAPY NEURO EVALUATION   Patient Name: [name] MRN: [id number] DOB:Jan 11, 1961, 61 y.o., female Today's Date: 06/30/2022   PCP: Harrison Mons, [place]  REFERRING PROVIDER: Harrison Mons, PA    PT End of Session - 06/30/22 1406     Visit Number 1    Number of Visits 9   with eval   Date for PT Re-Evaluation 08/25/22    Authorization Type UHC Medicare    PT Start Time 1405    PT Stop Time 1435    PT Time Calculation (min) 30 min    Activity Tolerance Patient tolerated treatment well    Behavior During Therapy WFL for tasks assessed/performed             Past Medical History:  Diagnosis Date   Anemia    history of   Arthritis    Decubitus ulcers    currently treated at wound center   Diabetes mellitus without complication ([place])    Diabetic polyneuropathy ([place])    Endometrial cancer ([place])    Grade 2   Fibroids    GERD (gastroesophageal reflux disease)    Heartburn    Occ   History of radiation therapy 03/20/2019   02/14/2019 - 03/20/2019 Pelvis / 45 Gy in 25 fractions  Dr Gery Pray   History of radiation therapy 04/11/2019   03/27/2019, 04/04/2019, 04/11/2019 (HDR, [facility]): Vaginal Cuff (cylinder, 3 cm diameter) / 18 Gy in 3 fractions (3 cm tx length) Dr Gery Pray   Hypertension    Mixed hyperlipidemia    Morbid obesity ([place])    MS (multiple sclerosis) ([place])    Past Surgical History:  Procedure Laterality Date   BRAIN TUMOR EXCISION  09/2020   COLONOSCOPY     CYSTOSCOPY N/A 01/22/2020   Procedure: CYSTOSCOPY WITH SUPRA PUBIC TUBE CHANGE, BOTOX 200 UNITS, RETROGRADE PYELOGRAM BILATERAL. FULGERATION .5 CM-2CM;  Surgeon: Festus Aloe, MD;  Location: [facility];  Service: Urology;  Laterality: N/A;   ESOPHAGOGASTRODUODENOSCOPY N/A 12/02/2017   Procedure: ESOPHAGOGASTRODUODENOSCOPY (EGD);  Surgeon: Ronnette Juniper, MD;  Location: [place];  Service: Gastroenterology;  Laterality: N/A;   ROBOTIC ASSISTED TOTAL HYSTERECTOMY WITH BILATERAL  SALPINGO OOPHERECTOMY Bilateral 12/19/2018   Procedure: XI ROBOTIC ASSISTED TOTAL HYSTERECTOMY (UTERUS GREATER THAN 250gr) WITH BILATERAL SALPINGO OOPHORECTOMY;  Surgeon: Everitt Amber, MD;  Location[facility];  Service: Gynecology;  Laterality: Bilateral;   SENTINEL NODE BIOPSY N/A 12/19/2018   Procedure: SENTINEL NODE BIOPSY;  Surgeon: Everitt Amber, MD;  [facility];  Service: Gynecology;  Laterality: N/A;   SUPRAPUBIC CATHETER INSERTION  2009   TONSILLECTOMY     TUBAL LIGATION     Patient Active Problem List   Diagnosis Date Noted   Pressure injury of skin 05/02/2022   Angioedema of lips 05/02/2022   Angioedema of intestine due to angiotensin converting enzyme inhibitor (ACE-I) 05/01/2022   Rectal pain 03/17/2022   Ganglion cyst of volar aspect of left wrist 01/01/2022   Ganglion cyst of wrist, right 01/01/2022   Bilateral wrist pain 01/01/2022   Chronic midline low back pain without sciatica 09/10/2021   Meningioma ([place]) 12/17/2020   Cerebral meningioma ([employer]) 05/19/2020   Nuclear sclerotic cataract of both eyes 01/23/2020   Endometrial cancer ([place]) 11/21/2018   Morbid obesity ([place]) 11/21/2018   Pain management contract agreement 02/16/2017   Mixed hyperlipidemia 11/24/2016   Type 2 diabetes mellitus with diabetic polyneuropathy, without long-term current use of insulin ([place]) 11/24/2016   Multiple sclerosis ([place]) 11/18/2016   Leg weakness,  bilateral 11/18/2016   Dysesthesia 11/18/2016   Diplegia of both lower extremities ([place]) 11/18/2016   Diabetes, polyneuropathy ([place]) 11/18/2016   Chronic indwelling Foley catheter 11/17/2016   Essential hypertension 11/17/2016   Slow transit constipation 11/17/2016   Chronically on opiate therapy 11/17/2016   Physical deconditioning 11/17/2016    ONSET DATE: 06/16/2022   REFERRING DIAG: M54.50 (ICD-10-CM) - Low back pain   THERAPY DIAG:  Chronic low back pain, unspecified back pain laterality, unspecified whether sciatica  present  Multiple sclerosis ([place])  Rationale for Evaluation and Treatment Rehabilitation  SUBJECTIVE:                                                                                                                                                                                              SUBJECTIVE STATEMENT: Pt reports she was diagnosed with MS in 2006, had a bad relapse in 2007. Pt reports she was not able to stand well since this bad relapse but was able to stand for a few minutes. Pt reports she has not been able to stand at all or take steps since 2011 or 2012 due to LE weakness.  Pt accompanied by: self  PERTINENT HISTORY: history of hypertension, endometrial cancer, GERD, multiple sclerosis   PAIN:  Are you having pain? Yes: NPRS scale: 4/10 Pain location: from the waist down Pain description: sharp, achy, tingling, constant Aggravating factors: no Relieving factors: no  PRECAUTIONS: Fall  WEIGHT BEARING RESTRICTIONS No  FALLS: Has patient fallen in last 6 months? No  LIVING ENVIRONMENT: Lives with: lives with their family (daughter [name]) Lives in: House/apartment Stairs: No Has following equipment at home:  hospital bed, hoyer lift, PWC, SB, RW  Home health aide: MWF for 2 hours (bathing, dressing, household chores)  PLOF:  needs assist to roll in bed and to perform supine to/from sit, hoyer lift transfer, independent once up in [place] ([place] since 2008)  PATIENT GOALS  I want to be able to stand and stand pivot with 3 steps to get into w/c  OBJECTIVE:   COGNITION: Overall cognitive status: Within functional limits for tasks assessed   SENSATION: Numbness/tingling in BLE>BUE, distal>proximal  COORDINATION: Not assessed this session  POSTURE: rounded shoulders, forward head, and posterior pelvic tilt  LOWER EXTREMITY ROM:   Passive  Right Eval Left Eval  Hip flexion Decreased PROM Decreased PROM (not as limited as R hip)  Hip extension    Hip  abduction    Hip adduction    Hip internal rotation    Hip external rotation    Knee flexion    Knee extension Tight HS  Tight HS  Ankle dorsiflexion Tight DF Tight DF  Ankle plantarflexion    Ankle inversion    Ankle eversion     (Blank rows = not tested)  LOWER EXTREMITY MMT:    MMT Right Eval Left Eval  Hip flexion 0 1  Hip extension    Hip abduction    Hip adduction    Hip internal rotation    Hip external rotation    Knee flexion 2- 1  Knee extension 0 2+  Ankle dorsiflexion 0 1  Ankle plantarflexion 0 1  Ankle inversion    Ankle eversion    (Blank rows = not tested)  BED MOBILITY:  Sit to supine Max A Supine to sit Max A Rolling to Right Min A Rolling to Left Min A  PATIENT SURVEYS:  Modified Oswestry 29/50, severe disability   TODAY'S TREATMENT:  PT evaluation  PATIENT EDUCATION: Education details: Eval findings, POC Person educated: Patient Education method: Explanation Education comprehension: verbalized understanding   HOME EXERCISE PROGRAM: To be established next session   GOALS: Goals reviewed with patient? Yes  STG = LTG which are to be reassessed after 30 days  LONG TERM GOALS: Target date: 07/28/2022  Pt will be independent with home stretching program for improved LE ROM to allow more functional and independent mobility  Baseline:  Goal status: INITIAL  2.  Pt will be able to perform slide board transfer with assist x 1 for more independent and functional mobility Baseline:  Goal status: INITIAL  3.  Pt will be able to tolerate static standing with LRAD x 5 min for improved functional mobility and for pressure relief/offloading Baseline:  Goal status: INITIAL  4.  Pt will score </= 24/80 on Modified Oswestry to demonstrate decreased severity of disability Baseline: 29/80 (06/30/22) Goal status: INITIAL   ASSESSMENT:  CLINICAL IMPRESSION: Patient is a 61 year old female referred to Neuro OPPT for low back pain.   Pt's PMH is  significant for: hypertension, endometrial cancer, GERD, and multiple sclerosis.  The following deficits were present during the exam: impaired BLE strength and ROM as well as impaired mobility and independence with functional mobility. Based on the findings of this evaluation, pt is an increased risk for falls. Pt would benefit from skilled PT to address these impairments and functional limitations to maximize functional mobility independence.   OBJECTIVE IMPAIRMENTS decreased balance, decreased endurance, decreased mobility, difficulty walking, decreased ROM, decreased strength, obesity, and pain.   ACTIVITY LIMITATIONS carrying, lifting, bending, standing, stairs, transfers, bed mobility, bathing, toileting, dressing, and locomotion level  PARTICIPATION LIMITATIONS: meal prep, cleaning, laundry, driving, community activity, and yard work  [place], Past/current experiences, Time since onset of injury/illness/exacerbation, Transportation, and 1-2 comorbidities: HTN, endometrial CA, GERD, MS  are also affecting patient's functional outcome.   REHAB POTENTIAL: Fair pt has not been able to stand or ambulate in 10+ years, has a goal to be able to stand and pivot for transfers. Pt has decreased LE ROM and strength.  CLINICAL DECISION MAKING: Stable/uncomplicated  EVALUATION COMPLEXITY: Moderate  PLAN: PT FREQUENCY: 1x/week  PT DURATION: 8 weeks (schedule for 4 weeks then reassess to see if appropriate to continue)  PLANNED INTERVENTIONS: Therapeutic exercises, Therapeutic activity, Neuromuscular re-education, Balance training, Gait training, Patient/Family education, Self Care, Joint mobilization, Orthotic/Fit training, DME instructions, Dry Needling, Electrical stimulation, Wheelchair mobility training, Cryotherapy, Moist heat, Taping, Manual therapy, and Re-evaluation  PLAN FOR NEXT SESSION: Initiate stretching program, assess sitting balance on mat table (will  need to hoyer  transfer), assess safety/ability to perform slide board transfer, standing frame vs Denna Haggard vs Clarise Cruz Plus   Excell Seltzer, [place], DPT, CSRS 06/30/2022, 2:43 PM

## 2022-06-30 NOTE — Telephone Encounter (Signed)
FAXED ON 06/30/2022  [name],  [name] was evaluated by PT on 06/30/22 for low back pain.  The patient mentioned during the evaluation that her goal is to work on strengthening and transfers. Can you please send a new referral for muscle weakness or gait abnormality so that PT can address this with her?  If you agree, please place an order in [facility] workque in [facility] or fax the order to [phone].  Thank you, Excell Seltzer, PT, DPT, [facility] [address] [place], [place]  [address] Phone:  [phone] Fax:  [phone]

## 2022-07-07 ENCOUNTER — Ambulatory Visit: Payer: Medicare Other | Attending: Physician Assistant

## 2022-07-07 DIAGNOSIS — M545 Low back pain, unspecified: Secondary | ICD-10-CM | POA: Diagnosis present

## 2022-07-07 DIAGNOSIS — M79661 Pain in right lower leg: Secondary | ICD-10-CM | POA: Insufficient documentation

## 2022-07-07 DIAGNOSIS — G35 Multiple sclerosis: Secondary | ICD-10-CM | POA: Diagnosis present

## 2022-07-07 DIAGNOSIS — M79662 Pain in left lower leg: Secondary | ICD-10-CM | POA: Insufficient documentation

## 2022-07-07 DIAGNOSIS — G8929 Other chronic pain: Secondary | ICD-10-CM | POA: Insufficient documentation

## 2022-07-07 NOTE — Therapy (Signed)
OUTPATIENT PHYSICAL THERAPY TREATMENT NOTE   Patient Name: [name] MRN: [id number] DOB:[birth year], 60 y.o., female Today's Date: 07/07/2022  PCP: Harrison Mons, PA  REFERRING PROVIDER: Harrison Mons, PA   END OF SESSION:   PT End of Session - 07/07/22 1359     Visit Number 2    Number of Visits 9    Date for PT Re-Evaluation 08/25/22    Authorization Type UHC Medicare    PT Start Time 1400    PT Stop Time 1450    PT Time Calculation (min) 50 min    Activity Tolerance Patient tolerated treatment well    Behavior During Therapy WFL for tasks assessed/performed             Past Medical History:  Diagnosis Date   Anemia    history of   Arthritis    Decubitus ulcers    currently treated at wound center   Diabetes mellitus without complication ([place])    Diabetic polyneuropathy ([place])    Endometrial cancer ([place])    Grade 2   Fibroids    GERD (gastroesophageal reflux disease)    Heartburn    Occ   History of radiation therapy 03/20/2019   02/14/2019 - 03/20/2019 Pelvis / 45 Gy in 25 fractions  Dr Gery Pray   History of radiation therapy 04/11/2019   03/27/2019, 04/04/2019, 04/11/2019 (HDR, [facility]): Vaginal Cuff (cylinder, 3 cm diameter) / 18 Gy in 3 fractions (3 cm tx length) Dr Gery Pray   Hypertension    Mixed hyperlipidemia    Morbid obesity ([place])    MS (multiple sclerosis) ([place])    Past Surgical History:  Procedure Laterality Date   BRAIN TUMOR EXCISION  09/2020   COLONOSCOPY     CYSTOSCOPY N/A 01/22/2020   Procedure: CYSTOSCOPY WITH SUPRA PUBIC TUBE CHANGE, BOTOX 200 UNITS, RETROGRADE PYELOGRAM BILATERAL. FULGERATION .5 CM-2CM;  Surgeon: Festus Aloe, MD;  [facility];  Service: Urology;  Laterality: N/A;   ESOPHAGOGASTRODUODENOSCOPY N/A 12/02/2017   Procedure: ESOPHAGOGASTRODUODENOSCOPY (EGD);  Surgeon: Ronnette Juniper, MD;  Location: [place];  Service: Gastroenterology;  Laterality: N/A;   ROBOTIC ASSISTED TOTAL HYSTERECTOMY WITH  BILATERAL SALPINGO OOPHERECTOMY Bilateral 12/19/2018   Procedure: XI ROBOTIC ASSISTED TOTAL HYSTERECTOMY (UTERUS GREATER THAN 250gr) WITH BILATERAL SALPINGO OOPHORECTOMY;  Surgeon: Everitt Amber, MD;  [facility];  Service: Gynecology;  Laterality: Bilateral;   SENTINEL NODE BIOPSY N/A 12/19/2018   Procedure: SENTINEL NODE BIOPSY;  Surgeon: Everitt Amber, MD;  [facility];  Service: Gynecology;  Laterality: N/A;   SUPRAPUBIC CATHETER INSERTION  2009   TONSILLECTOMY     TUBAL LIGATION     Patient Active Problem List   Diagnosis Date Noted   Pressure injury of skin 05/02/2022   Angioedema of lips 05/02/2022   Angioedema of intestine due to angiotensin converting enzyme inhibitor (ACE-I) 05/01/2022   Rectal pain 03/17/2022   Ganglion cyst of volar aspect of left wrist 01/01/2022   Ganglion cyst of wrist, right 01/01/2022   Bilateral wrist pain 01/01/2022   Chronic midline low back pain without sciatica 09/10/2021   Meningioma ([place]) 12/17/2020   Cerebral meningioma ([place]) 05/19/2020   Nuclear sclerotic cataract of both eyes 01/23/2020   Endometrial cancer ([place]) 11/21/2018   Morbid obesity ([place]) 11/21/2018   Pain management contract agreement 02/16/2017   Mixed hyperlipidemia 11/24/2016   Type 2 diabetes mellitus with diabetic polyneuropathy, without long-term current use of insulin ([place]) 11/24/2016   Multiple sclerosis ([place]) 11/18/2016   Leg weakness,  bilateral 11/18/2016   Dysesthesia 11/18/2016   Diplegia of both lower extremities ([place]) 11/18/2016   Diabetes, polyneuropathy ([place]) 11/18/2016   Chronic indwelling Foley catheter 11/17/2016   Essential hypertension 11/17/2016   Slow transit constipation 11/17/2016   Chronically on opiate therapy 11/17/2016   Physical deconditioning 11/17/2016    REFERRING DIAG: M54.50 (ICD-10-CM) - Low back pain   THERAPY DIAG:  Chronic low back pain, unspecified back pain laterality, unspecified whether sciatica present  Multiple sclerosis  ([facility])  Pain in left lower leg  Pain in right lower leg  Rationale for Evaluation and Treatment Rehabilitation  PERTINENT HISTORY: history of hypertension, endometrial cancer, GERD, multiple sclerosis   PRECAUTIONS: fall  SUBJECTIVE: Patient reports doing well. In new pwc, states she's had it for 4 weeks and so far works well. States that she has not been doing exercise per se at home, sitting up in bed using bed features and rolling onto R side.   PAIN:  Are you having pain? Yes: NPRS scale: 5/10 Pain location: "waist down" Pain description: tingling/numbness Aggravating factors: none Relieving factors: "not really"   TODAY'S TREATMENT:  NMR:  -Hoyer transfer to mat -Dep sitting balance with posterior support-> totalA to initiate anterior weight shift and MaxA/TotalA to maintain anterior weight shift -prolonged stretch to Low back biasing R and L -provided patient with stretching HEP (see below)    PATIENT EDUCATION: Education details: HEP  Person educated: Patient Education method: Explanation Education comprehension: verbalized understanding     HOME EXERCISE PROGRAM: Access Code: [email or web address] URL: [email or web address] Date: 07/07/2022 Prepared by: Estevan Ryder  Exercises - Supine Hip and Knee Flexion PROM with Caregiver  - 1 x daily - 7 x weekly - 3 sets - 10 reps - Foot Dorsiflexion PROM Caregiver  - 1 x daily - 7 x weekly - 3 sets - 10 reps - Hip Abduction and Adduction Caregiver PROM  - 1 x daily - 7 x weekly - 3 sets - 10 reps - Hip Internal and External Rotation Caregiver PROM  - 1 x daily - 7 x weekly - 3 sets - 10 reps - Sidelying Hip Extension PROM with Caregiver  - 1 x daily - 7 x weekly - 3 sets - 10 reps     GOALS: Goals reviewed with patient? Yes   STG = LTG which are to be reassessed after 30 days   LONG TERM GOALS: Target date: 07/28/2022   Pt will be independent with home stretching program for improved LE ROM to allow more  functional and independent mobility  Baseline:  Goal status: INITIAL   2.  Pt will be able to perform slide board transfer with assist x 1 for more independent and functional mobility Baseline:  Goal status: INITIAL   3.  Pt will be able to tolerate static standing with LRAD x 5 min for improved functional mobility and for pressure relief/offloading Baseline:  Goal status: INITIAL   4.  Pt will score </= 24/80 on Modified Oswestry to demonstrate decreased severity of disability Baseline: 29/80 (06/30/22) Goal status: INITIAL     ASSESSMENT:   CLINICAL IMPRESSION: Patient seen for skilled PT session with emphasis on establishing stretching HEP. Patient with significant tone in B LE limited PROM of knees/hips into flexion. Tolerated stretching well. Bed mobility is Dep x2 for safety. Patient requiring TotalA x2 to reposition in the wc. Continue POC.     OBJECTIVE IMPAIRMENTS decreased balance, decreased endurance, decreased mobility, difficulty walking, decreased ROM, decreased strength, obesity,  and pain.    ACTIVITY LIMITATIONS carrying, lifting, bending, standing, stairs, transfers, bed mobility, bathing, toileting, dressing, and locomotion level   PARTICIPATION LIMITATIONS: meal prep, cleaning, laundry, driving, community activity, and yard work   [place], Past/current experiences, Time since onset of injury/illness/exacerbation, Transportation, and 1-2 comorbidities: HTN, endometrial CA, GERD, MS  are also affecting patient's functional outcome.    REHAB POTENTIAL: Fair pt has not been able to stand or ambulate in 10+ years, has a goal to be able to stand and pivot for transfers. Pt has decreased LE ROM and strength.   CLINICAL DECISION MAKING: Stable/uncomplicated   EVALUATION COMPLEXITY: Moderate   PLAN: PT FREQUENCY: 1x/week   PT DURATION: 8 weeks (schedule for 4 weeks then reassess to see if appropriate to continue)   PLANNED INTERVENTIONS: Therapeutic  exercises, Therapeutic activity, Neuromuscular re-education, Balance training, Gait training, Patient/Family education, Self Care, Joint mobilization, Orthotic/Fit training, DME instructions, Dry Needling, Electrical stimulation, Wheelchair mobility training, Cryotherapy, Moist heat, Taping, Manual therapy, and Re-evaluation   PLAN FOR NEXT SESSION: Initiate stretching program, assess sitting balance on mat table (will need to hoyer transfer), assess safety/ability to perform slide board transfer, standing frame vs Denna Haggard vs [place] Kamyla Olejnik, PT Debbora Dus, PT, DPT, CBIS  07/07/2022, 2:51 PM

## 2022-07-12 ENCOUNTER — Ambulatory Visit: Payer: Self-pay | Admitting: Radiation Oncology

## 2022-07-14 ENCOUNTER — Encounter: Payer: Self-pay | Admitting: Physical Therapy

## 2022-07-14 ENCOUNTER — Ambulatory Visit: Payer: Medicare Other | Admitting: Physical Therapy

## 2022-07-14 DIAGNOSIS — M79662 Pain in left lower leg: Secondary | ICD-10-CM

## 2022-07-14 DIAGNOSIS — M545 Low back pain, unspecified: Secondary | ICD-10-CM | POA: Diagnosis not present

## 2022-07-14 DIAGNOSIS — M79661 Pain in right lower leg: Secondary | ICD-10-CM

## 2022-07-14 DIAGNOSIS — G35 Multiple sclerosis: Secondary | ICD-10-CM

## 2022-07-14 DIAGNOSIS — G8929 Other chronic pain: Secondary | ICD-10-CM

## 2022-07-14 NOTE — Therapy (Signed)
OUTPATIENT PHYSICAL THERAPY TREATMENT NOTE   Patient Name: [name] MRN: [id number] DOB:[birth year], 61 y.o., female Today's Date: 07/14/2022  PCP: Harrison Mons, PA  REFERRING PROVIDER: Harrison Mons, PA   END OF SESSION:   PT End of Session - 07/14/22 1420     Visit Number 3    Number of Visits 9    Date for PT Re-Evaluation 08/25/22    Authorization Type U[employer] Medicare    PT Start Time 2951    PT Stop Time 1445    PT Time Calculation (min) 40 min    Activity Tolerance Patient tolerated treatment well    Behavior During Therapy WFL for tasks assessed/performed              Past Medical History:  Diagnosis Date   Anemia    history of   Arthritis    Decubitus ulcers    currently treated at wound center   Diabetes mellitus without complication ([place])    Diabetic polyneuropathy ([place])    Endometrial cancer ([place])    Grade 2   Fibroids    GERD (gastroesophageal reflux disease)    Heartburn    Occ   History of radiation therapy 03/20/2019   02/14/2019 - 03/20/2019 Pelvis / 45 Gy in 25 fractions  Dr Gery Pray   History of radiation therapy 04/11/2019   03/27/2019, 04/04/2019, 04/11/2019 (HDR, [facility]): Vaginal Cuff (cylinder, 3 cm diameter) / 18 Gy in 3 fractions (3 cm tx length) Dr Gery Pray   Hypertension    Mixed hyperlipidemia    Morbid obesity ([place])    MS (multiple sclerosis) ([place])    Past Surgical History:  Procedure Laterality Date   BRAIN TUMOR EXCISION  09/2020   COLONOSCOPY     CYSTOSCOPY N/A 01/22/2020   Procedure: CYSTOSCOPY WITH SUPRA PUBIC TUBE CHANGE, BOTOX 200 UNITS, RETROGRADE PYELOGRAM BILATERAL. FULGERATION .5 CM-2CM;  Surgeon: Festus Aloe, MD;  Location: [facility];  Service: Urology;  Laterality: N/A;   ESOPHAGOGASTRODUODENOSCOPY N/A 12/02/2017   Procedure: ESOPHAGOGASTRODUODENOSCOPY (EGD);  Surgeon: Ronnette Juniper, MD;  Location: [facility];  Service: Gastroenterology;  Laterality: N/A;   ROBOTIC ASSISTED TOTAL HYSTERECTOMY WITH  BILATERAL SALPINGO OOPHERECTOMY Bilateral 12/19/2018   Procedure: XI ROBOTIC ASSISTED TOTAL HYSTERECTOMY (UTERUS GREATER THAN 250gr) WITH BILATERAL SALPINGO OOPHORECTOMY;  Surgeon: Everitt Amber, MD;  Location: [facility];  Service: Gynecology;  Laterality: Bilateral;   SENTINEL NODE BIOPSY N/A 12/19/2018   Procedure: SENTINEL NODE BIOPSY;  Surgeon: Everitt Amber, MD;  Location: [facility];  Service: Gynecology;  Laterality: N/A;   SUPRAPUBIC CATHETER INSERTION  2009   TONSILLECTOMY     TUBAL LIGATION     Patient Active Problem List   Diagnosis Date Noted   Pressure injury of skin 05/02/2022   Angioedema of lips 05/02/2022   Angioedema of intestine due to angiotensin converting enzyme inhibitor (ACE-I) 05/01/2022   Rectal pain 03/17/2022   Ganglion cyst of volar aspect of left wrist 01/01/2022   Ganglion cyst of wrist, right 01/01/2022   Bilateral wrist pain 01/01/2022   Chronic midline low back pain without sciatica 09/10/2021   Meningioma ([place]) 12/17/2020   Cerebral meningioma ([place]) 05/19/2020   Nuclear sclerotic cataract of both eyes 01/23/2020   Endometrial cancer ([place]) 11/21/2018   Morbid obesity ([place]) 11/21/2018   Pain management contract agreement 02/16/2017   Mixed hyperlipidemia 11/24/2016   Type 2 diabetes mellitus with diabetic polyneuropathy, without long-term current use of insulin ([place]) 11/24/2016   Multiple sclerosis ([place]) 11/18/2016   Leg  weakness, bilateral 11/18/2016   Dysesthesia 11/18/2016   Diplegia of both lower extremities ([place]) 11/18/2016   Diabetes, polyneuropathy ([place]) 11/18/2016   Chronic indwelling Foley catheter 11/17/2016   Essential hypertension 11/17/2016   Slow transit constipation 11/17/2016   Chronically on opiate therapy 11/17/2016   Physical deconditioning 11/17/2016    REFERRING DIAG: M54.50 (ICD-10-CM) - Low back pain   THERAPY DIAG:  Chronic low back pain, unspecified back pain laterality, unspecified whether sciatica present  Multiple sclerosis  (HCC)  Pain in left lower leg  Pain in right lower leg  Rationale for Evaluation and Treatment Rehabilitation  PERTINENT HISTORY: history of hypertension, endometrial cancer, GERD, multiple sclerosis   PRECAUTIONS: fall  SUBJECTIVE: Pt reports she is doing well today. Pt reports she has not been able to work on her stretches for her HEP yet. No other changes since last session.  PAIN:  Are you having pain? Yes: NPRS scale: 5/10 Pain location: "waist down" Pain description: tingling/numbness, constant Aggravating factors: none Relieving factors: "not really"   TODAY'S TREATMENT:  NMR:  -Hoyer transfer to mat -Dep sitting balance with posterior support-> maxA to initiate anterior weight shift and use of BUE on mat table and/or up to mod A needed to maintain anterior weight shift -seated lateral leans on mat table with maxA support posteriorly and min A anteriorly to initiate transfer and to return to midline, x 5 reps each direction -seated reaching outside BOS with alt UE with max A support posteriorly and CGA to min A anteriorly -seated reaching with alt UE outside BOS for cones, passing to other UE, and reaching to place cone outside BOS with max A posteriorly and min to mod A anteriorly due to some fear of falling and fear of leaning outside BOS  Pt exhibits increased difficulty leaning to her R side vs her L side.    PATIENT EDUCATION: Education details: continue HEP Person educated: Patient Education method: Explanation Education comprehension: verbalized understanding     HOME EXERCISE PROGRAM: Access Code: [email or web address] URL: [email or web address] Date: 07/07/2022 Prepared by: Estevan Ryder  Exercises - Supine Hip and Knee Flexion PROM with Caregiver  - 1 x daily - 7 x weekly - 3 sets - 10 reps - Foot Dorsiflexion PROM Caregiver  - 1 x daily - 7 x weekly - 3 sets - 10 reps - Hip Abduction and Adduction Caregiver PROM  - 1 x daily - 7 x weekly - 3 sets -  10 reps - Hip Internal and External Rotation Caregiver PROM  - 1 x daily - 7 x weekly - 3 sets - 10 reps - Sidelying Hip Extension PROM with Caregiver  - 1 x daily - 7 x weekly - 3 sets - 10 reps     GOALS: Goals reviewed with patient? Yes   STG = LTG which are to be reassessed after 30 days   LONG TERM GOALS: Target date: 07/28/2022   Pt will be independent with home stretching program for improved LE ROM to allow more functional and independent mobility  Baseline:  Goal status: INITIAL   2.  Pt will be able to perform slide board transfer with assist x 1 for more independent and functional mobility Baseline:  Goal status: INITIAL   3.  Pt will be able to tolerate static standing with LRAD x 5 min for improved functional mobility and for pressure relief/offloading Baseline:  Goal status: INITIAL   4.  Pt will score </= 24/80 on Modified Oswestry to demonstrate decreased  severity of disability Baseline: 29/80 (06/30/22) Goal status: INITIAL     ASSESSMENT:   CLINICAL IMPRESSION: Emphasis of skilled PT session on seated balance EOM with focus on lateral leaning to prepare patient to initiate slide board transfers. Pt exhibits increased difficulty leaning to the R and returning to midline from R side as compared to her L side. Pt continues to remain dependent for transfers via hoyer lift at this point. Continue POC.     OBJECTIVE IMPAIRMENTS decreased balance, decreased endurance, decreased mobility, difficulty walking, decreased ROM, decreased strength, obesity, and pain.    ACTIVITY LIMITATIONS carrying, lifting, bending, standing, stairs, transfers, bed mobility, bathing, toileting, dressing, and locomotion level   PARTICIPATION LIMITATIONS: meal prep, cleaning, laundry, driving, community activity, and yard work   [place], Past/current experiences, Time since onset of injury/illness/exacerbation, Transportation, and 1-2 comorbidities: HTN, endometrial CA,  GERD, MS  are also affecting patient's functional outcome.    REHAB POTENTIAL: Fair pt has not been able to stand or ambulate in 10+ years, has a goal to be able to stand and pivot for transfers. Pt has decreased LE ROM and strength.   CLINICAL DECISION MAKING: Stable/uncomplicated   EVALUATION COMPLEXITY: Moderate   PLAN: PT FREQUENCY: 1x/week   PT DURATION: 8 weeks (schedule for 4 weeks then reassess to see if appropriate to continue)   PLANNED INTERVENTIONS: Therapeutic exercises, Therapeutic activity, Neuromuscular re-education, Balance training, Gait training, Patient/Family education, Self Care, Joint mobilization, Orthotic/Fit training, DME instructions, Dry Needling, Electrical stimulation, Wheelchair mobility training, Cryotherapy, Moist heat, Taping, Manual therapy, and Re-evaluation   PLAN FOR NEXT SESSION: add to stretching program, sitting balance on mat table (will need to hoyer transfer), assess safety/ability to perform slide board transfer, standing frame vs Denna Haggard vs Clarise Cruz Plus      Excell Seltzer, [place], DPT, CSRS  07/14/2022, 2:53 PM

## 2022-07-21 ENCOUNTER — Ambulatory Visit: Payer: Medicare Other | Admitting: Physical Therapy

## 2022-07-21 DIAGNOSIS — M545 Low back pain, unspecified: Secondary | ICD-10-CM

## 2022-07-21 DIAGNOSIS — G35D Multiple sclerosis, unspecified: Secondary | ICD-10-CM

## 2022-07-21 DIAGNOSIS — G35 Multiple sclerosis: Secondary | ICD-10-CM

## 2022-07-21 DIAGNOSIS — M79661 Pain in right lower leg: Secondary | ICD-10-CM

## 2022-07-21 DIAGNOSIS — M79662 Pain in left lower leg: Secondary | ICD-10-CM

## 2022-07-21 NOTE — Therapy (Signed)
OUTPATIENT PHYSICAL THERAPY TREATMENT NOTE   Patient Name: [name] MRN: [id number] DOB:Sep 16, 1961, 61 y.o., female Today's Date: 07/21/2022  PCP: Harrison Mons, PA  REFERRING PROVIDER: Harrison Mons, PA   END OF SESSION:   PT End of Session - 07/21/22 0400     Visit Number 4    Number of Visits 9    Date for PT Re-Evaluation 08/25/22    Authorization Type UHC Medicare    Progress Note Due on Visit 10    PT Start Time 1401    PT Stop Time 1445    PT Time Calculation (min) 44 min    Equipment Utilized During Treatment Other (comment)   SaraPlus   Activity Tolerance Patient tolerated treatment well    Behavior During Therapy WFL for tasks assessed/performed               Past Medical History:  Diagnosis Date   Anemia    history of   Arthritis    Decubitus ulcers    currently treated at wound center   Diabetes mellitus without complication ([place])    Diabetic polyneuropathy ([place])    Endometrial cancer ([place])    Grade 2   Fibroids    GERD (gastroesophageal reflux disease)    Heartburn    Occ   History of radiation therapy 03/20/2019   02/14/2019 - 03/20/2019 Pelvis / 45 Gy in 25 fractions  Dr Gery Pray   History of radiation therapy 04/11/2019   03/27/2019, 04/04/2019, 04/11/2019 (HDR, [facility]): Vaginal Cuff (cylinder, 3 cm diameter) / 18 Gy in 3 fractions (3 cm tx length) Dr Gery Pray   Hypertension    Mixed hyperlipidemia    Morbid obesity ([place])    MS (multiple sclerosis) ([place])    Past Surgical History:  Procedure Laterality Date   BRAIN TUMOR EXCISION  09/2020   COLONOSCOPY     CYSTOSCOPY N/A 01/22/2020   Procedure: CYSTOSCOPY WITH SUPRA PUBIC TUBE CHANGE, BOTOX 200 UNITS, RETROGRADE PYELOGRAM BILATERAL. FULGERATION .5 CM-2CM;  Surgeon: Festus Aloe, MD;  [facility];  Service: Urology;  Laterality: N/A;   ESOPHAGOGASTRODUODENOSCOPY N/A 12/02/2017   Procedure: ESOPHAGOGASTRODUODENOSCOPY (EGD);  Surgeon: Ronnette Juniper, MD;  Location: [facility];  Service: Gastroenterology;  Laterality: N/A;   ROBOTIC ASSISTED TOTAL HYSTERECTOMY WITH BILATERAL SALPINGO OOPHERECTOMY Bilateral 12/19/2018   Procedure: XI ROBOTIC ASSISTED TOTAL HYSTERECTOMY (UTERUS GREATER THAN 250gr) WITH BILATERAL SALPINGO OOPHORECTOMY;  Surgeon: Everitt Amber, MD;  Location[facility];  Service: Gynecology;  Laterality: Bilateral;   SENTINEL NODE BIOPSY N/A 12/19/2018   Procedure: SENTINEL NODE BIOPSY;  Surgeon: Everitt Amber, MD;  Location[facility];  Service: Gynecology;  Laterality: N/A;   SUPRAPUBIC CATHETER INSERTION  2009   TONSILLECTOMY     TUBAL LIGATION     Patient Active Problem List   Diagnosis Date Noted   Pressure injury of skin 05/02/2022   Angioedema of lips 05/02/2022   Angioedema of intestine due to angiotensin converting enzyme inhibitor (ACE-I) 05/01/2022   Rectal pain 03/17/2022   Ganglion cyst of volar aspect of left wrist 01/01/2022   Ganglion cyst of wrist, right 01/01/2022   Bilateral wrist pain 01/01/2022   Chronic midline low back pain without sciatica 09/10/2021   Meningioma ([place]) 12/17/2020   Cerebral meningioma ([place]) 05/19/2020   Nuclear sclerotic cataract of both eyes 01/23/2020   Endometrial cancer ([place]) 11/21/2018   Morbid obesity ([place]) 11/21/2018   Pain management contract agreement 02/16/2017   Mixed hyperlipidemia 11/24/2016   Type 2 diabetes  mellitus with diabetic polyneuropathy, without long-term current use of insulin ([place]) 11/24/2016   Multiple sclerosis ([place]) 11/18/2016   Leg weakness, bilateral 11/18/2016   Dysesthesia 11/18/2016   Diplegia of both lower extremities ([place]) 11/18/2016   Diabetes, polyneuropathy ([place]) 11/18/2016   Chronic indwelling Foley catheter 11/17/2016   Essential hypertension 11/17/2016   Slow transit constipation 11/17/2016   Chronically on opiate therapy 11/17/2016   Physical deconditioning 11/17/2016    REFERRING DIAG: M54.50 (ICD-10-CM) - Low back pain   THERAPY DIAG:  Chronic low back  pain, unspecified back pain laterality, unspecified whether sciatica present  Multiple sclerosis (HCC)  Pain in left lower leg  Pain in right lower leg  Rationale for Evaluation and Treatment Rehabilitation  PERTINENT HISTORY: history of hypertension, endometrial cancer, GERD, multiple sclerosis   PRECAUTIONS: fall  SUBJECTIVE: No changes since last session, pain is stil about 5/10 in lower body. Pt reports she has been able to start HEP stretching program with family/caregiver.  PAIN:  Are you having pain? Yes: NPRS scale: 5/10 Pain location: "waist down" Pain description: tingling/numbness, constant Aggravating factors: none Relieving factors: "not really"   TODAY'S TREATMENT:  NMR:  Session focus on standing with use of SaraPlus. Pt initially with assist x 2 for safe setup of equipment and able to complete partial stands x 2 reps. Pt has some difficulty coming to a full stand initially with use of SaraPlus due to sling sliding up from her hips to her waist. With assist x 3 with 2 people holding sheet as sling around pt's hips she is able to come to a full stand x 5 min. Pt also benefits from power wheelchair seat being in elevated position. Pt with no sign/symptoms of OH in standing. Attempted to have pt perform quad sets and/or glute sets in standing with trace muscle activation noted.   PATIENT EDUCATION: Education details: continue HEP Person educated: Patient Education method: Explanation Education comprehension: verbalized understanding     HOME EXERCISE PROGRAM: Access Code: [email or web address] URL: [email or web address] Date: 07/07/2022 Prepared by: Estevan Ryder  Exercises - Supine Hip and Knee Flexion PROM with Caregiver  - 1 x daily - 7 x weekly - 3 sets - 10 reps - Foot Dorsiflexion PROM Caregiver  - 1 x daily - 7 x weekly - 3 sets - 10 reps - Hip Abduction and Adduction Caregiver PROM  - 1 x daily - 7 x weekly - 3 sets - 10 reps - Hip Internal and  External Rotation Caregiver PROM  - 1 x daily - 7 x weekly - 3 sets - 10 reps - Sidelying Hip Extension PROM with Caregiver  - 1 x daily - 7 x weekly - 3 sets - 10 reps     GOALS: Goals reviewed with patient? Yes   STG = LTG which are to be reassessed after 30 days   LONG TERM GOALS: Target date: 07/28/2022   Pt will be independent with home stretching program for improved LE ROM to allow more functional and independent mobility  Baseline:  Goal status: INITIAL   2.  Pt will be able to perform slide board transfer with assist x 1 for more independent and functional mobility Baseline:  Goal status: INITIAL   3.  Pt will be able to tolerate static standing with LRAD x 5 min for improved functional mobility and for pressure relief/offloading Baseline:  Goal status: INITIAL   4.  Pt will score </= 24/80 on Modified Oswestry to demonstrate decreased severity of disability  Baseline: 29/80 (06/30/22) Goal status: INITIAL     ASSESSMENT:   CLINICAL IMPRESSION: Emphasis of skilled PT session on working on standing with use of SaraPlus. Pt able to tolerate standing dependently x 5 min with use of SaraPlus with assist x 3 for positioning. Pt exhibits trace LE muscle activation in standing position. Pt with no increase in pain in standing position and no signs/symptoms of OH. Pt will continue to benefit from skilled therapy services to address global weakness and decreased independence with functional mobility and transfers. Continue POC.     OBJECTIVE IMPAIRMENTS decreased balance, decreased endurance, decreased mobility, difficulty walking, decreased ROM, decreased strength, obesity, and pain.    ACTIVITY LIMITATIONS carrying, lifting, bending, standing, stairs, transfers, bed mobility, bathing, toileting, dressing, and locomotion level   PARTICIPATION LIMITATIONS: meal prep, cleaning, laundry, driving, community activity, and yard work   [place], Past/current  experiences, Time since onset of injury/illness/exacerbation, Transportation, and 1-2 comorbidities: HTN, endometrial CA, GERD, MS  are also affecting patient's functional outcome.    REHAB POTENTIAL: Fair pt has not been able to stand or ambulate in 10+ years, has a goal to be able to stand and pivot for transfers. Pt has decreased LE ROM and strength.   CLINICAL DECISION MAKING: Stable/uncomplicated   EVALUATION COMPLEXITY: Moderate   PLAN: PT FREQUENCY: 1x/week   PT DURATION: 8 weeks (schedule for 4 weeks then reassess to see if appropriate to continue)   PLANNED INTERVENTIONS: Therapeutic exercises, Therapeutic activity, Neuromuscular re-education, Balance training, Gait training, Patient/Family education, Self Care, Joint mobilization, Orthotic/Fit training, DME instructions, Dry Needling, Electrical stimulation, Wheelchair mobility training, Cryotherapy, Moist heat, Taping, Manual therapy, and Re-evaluation   PLAN FOR NEXT SESSION: assess STG, add to stretching program, sitting balance on mat table (will need to hoyer transfer), assess safety/ability to perform slide board transfer, standing frame (350# weight limit, need larger sling) vs Clarise Cruz Plus, resisted LE strengthening     Excell Seltzer, PT, DPT, CSRS 07/21/2022, 2:49 PM

## 2022-07-28 ENCOUNTER — Ambulatory Visit: Payer: Medicare Other | Admitting: Physical Therapy

## 2022-07-28 DIAGNOSIS — G8929 Other chronic pain: Secondary | ICD-10-CM

## 2022-07-28 DIAGNOSIS — G35 Multiple sclerosis: Secondary | ICD-10-CM

## 2022-07-28 DIAGNOSIS — M545 Low back pain, unspecified: Secondary | ICD-10-CM | POA: Diagnosis not present

## 2022-07-28 DIAGNOSIS — M79662 Pain in left lower leg: Secondary | ICD-10-CM

## 2022-07-28 DIAGNOSIS — M79661 Pain in right lower leg: Secondary | ICD-10-CM

## 2022-07-28 NOTE — Therapy (Signed)
OUTPATIENT PHYSICAL THERAPY TREATMENT NOTE   Patient Name: [name] MRN: [id number] DOB:Oct 16, 1961, 61 y.o., female Today's Date: 07/28/2022  PCP: Harrison Mons, PA  REFERRING PROVIDER: Harrison Mons, PA   END OF SESSION:   PT End of Session - 07/28/22 1403     Visit Number 5    Number of Visits 9    Date for PT Re-Evaluation 08/25/22    Authorization Type [employer] Medicare    Progress Note Due on Visit 10    PT Start Time 1400    PT Stop Time 1450    PT Time Calculation (min) 50 min    Equipment Utilized During Treatment Other (comment)   SaraPlus   Activity Tolerance Patient tolerated treatment well    Behavior During Therapy WFL for tasks assessed/performed                Past Medical History:  Diagnosis Date   Anemia    history of   Arthritis    Decubitus ulcers    currently treated at wound center   Diabetes mellitus without complication ([place])    Diabetic polyneuropathy ([place])    Endometrial cancer ([place])    Grade 2   Fibroids    GERD (gastroesophageal reflux disease)    Heartburn    Occ   History of radiation therapy 03/20/2019   02/14/2019 - 03/20/2019 Pelvis / 45 Gy in 25 fractions  Dr Gery Pray   History of radiation therapy 04/11/2019   03/27/2019, 04/04/2019, 04/11/2019 (HDR, [facility]achy): Vaginal Cuff (cylinder, 3 cm diameter) / 18 Gy in 3 fractions (3 cm tx length) Dr Gery Pray   Hypertension    Mixed hyperlipidemia    Morbid obesity ([place])    MS (multiple sclerosis) ([place])    Past Surgical History:  Procedure Laterality Date   BRAIN TUMOR EXCISION  09/2020   COLONOSCOPY     CYSTOSCOPY N/A 01/22/2020   Procedure: CYSTOSCOPY WITH SUPRA PUBIC TUBE CHANGE, BOTOX 200 UNITS, RETROGRADE PYELOGRAM BILATERAL. FULGERATION .5 CM-2CM;  Surgeon: Festus Aloe, MD;  Location: [facility];  Service: Urology;  Laterality: N/A;   ESOPHAGOGASTRODUODENOSCOPY N/A 12/02/2017   Procedure: ESOPHAGOGASTRODUODENOSCOPY (EGD);  Surgeon: Ronnette Juniper, MD;  Location: [facility];  Service: Gastroenterology;  Laterality: N/A;   ROBOTIC ASSISTED TOTAL HYSTERECTOMY WITH BILATERAL SALPINGO OOPHERECTOMY Bilateral 12/19/2018   Procedure: XI ROBOTIC ASSISTED TOTAL HYSTERECTOMY (UTERUS GREATER THAN 250gr) WITH BILATERAL SALPINGO OOPHORECTOMY;  Surgeon: Everitt Amber, MD;  Location: [facility];  Service: Gynecology;  Laterality: Bilateral;   SENTINEL NODE BIOPSY N/A 12/19/2018   Procedure: SENTINEL NODE BIOPSY;  Surgeon: Everitt Amber, MD;  Location: [facility];  Service: Gynecology;  Laterality: N/A;   SUPRAPUBIC CATHETER INSERTION  2009   TONSILLECTOMY     TUBAL LIGATION     Patient Active Problem List   Diagnosis Date Noted   Pressure injury of skin 05/02/2022   Angioedema of lips 05/02/2022   Angioedema of intestine due to angiotensin converting enzyme inhibitor (ACE-I) 05/01/2022   Rectal pain 03/17/2022   Ganglion cyst of volar aspect of left wrist 01/01/2022   Ganglion cyst of wrist, right 01/01/2022   Bilateral wrist pain 01/01/2022   Chronic midline low back pain without sciatica 09/10/2021   Meningioma ([place]) 12/17/2020   Cerebral meningioma ([place]) 05/19/2020   Nuclear sclerotic cataract of both eyes 01/23/2020   Endometrial cancer ([place]) 11/21/2018   Morbid obesity ([place]) 11/21/2018   Pain management contract agreement 02/16/2017   Mixed hyperlipidemia 11/24/2016   Type 2  diabetes mellitus with diabetic polyneuropathy, without long-term current use of insulin ([place]) 11/24/2016   Multiple sclerosis ([place]) 11/18/2016   Leg weakness, bilateral 11/18/2016   Dysesthesia 11/18/2016   Diplegia of both lower extremities ([place]) 11/18/2016   Diabetes, polyneuropathy ([place]) 11/18/2016   Chronic indwelling Foley catheter 11/17/2016   Essential hypertension 11/17/2016   Slow transit constipation 11/17/2016   Chronically on opiate therapy 11/17/2016   Physical deconditioning 11/17/2016    REFERRING DIAG: M54.50 (ICD-10-CM) - Low back pain   THERAPY DIAG:  Chronic low back  pain, unspecified back pain laterality, unspecified whether sciatica present  Multiple sclerosis (HCC)  Pain in left lower leg  Pain in right lower leg  Rationale for Evaluation and Treatment Rehabilitation  PERTINENT HISTORY: history of hypertension, endometrial cancer, GERD, multiple sclerosis   PRECAUTIONS: fall  SUBJECTIVE: Pt reports she felt great after her last PT session after working on standing and is have a great day so far today. Pt reports she has been able to work on her stretching program at home, no questions. Pt reports she was doing transfer board/slide board transfers up until a few years ago and stopped because of sacral wounds.  PAIN:  Are you having pain? Yes: NPRS scale: 5/10 Pain location: "waist down" Pain description: tingling/numbness, constant Aggravating factors: none Relieving factors: "not really"   TODAY'S TREATMENT:  NMR:  Session focus on standing with use of SaraPlus. Pt requires skilled assist x 2 for safe setup of equipment and able to complete a full stand x 4 reps. Pt able to tolerate standing up to 2.5 min.  Pt also benefits from power wheelchair seat being in elevated position. Pt with no sign/symptoms of [place] in standing. While in standing pt able to perform mini-squats 2 x 5 reps before needing to return to sitting due to fatigue. Utilized sheet around pt's hips for improved control of hips in standing. Pt initially requires min A at the hips via sheet to remain standing, increases to max A with onset of fatigue. Pt does exhibit improved ability to stand more independently this date with use of SaraPlus as sheet as a hip sling though she does continue to heavily rely on BUE for support.  Oswestry Low Back Disability Questionnaire: 25/50 (severe disability)   PATIENT EDUCATION: Education details: continue HEP Person educated: Patient Education method: Explanation Education comprehension: verbalized understanding     HOME EXERCISE  PROGRAM: Access Code: [email or web address] URL: [email or web address] Date: 07/07/2022 Prepared by: Estevan Ryder  Exercises - Supine Hip and Knee Flexion PROM with Caregiver  - 1 x daily - 7 x weekly - 3 sets - 10 reps - Foot Dorsiflexion PROM Caregiver  - 1 x daily - 7 x weekly - 3 sets - 10 reps - Hip Abduction and Adduction Caregiver PROM  - 1 x daily - 7 x weekly - 3 sets - 10 reps - Hip Internal and External Rotation Caregiver PROM  - 1 x daily - 7 x weekly - 3 sets - 10 reps - Sidelying Hip Extension PROM with Caregiver  - 1 x daily - 7 x weekly - 3 sets - 10 reps     GOALS: Goals reviewed with patient? Yes   STG = LTG which are to be reassessed after 30 days   LONG TERM GOALS: Target date: 07/28/2022   Pt will be independent with home stretching program for improved LE ROM to allow more functional and independent mobility  Baseline:  Goal status: MET   2.  Pt will be able to perform slide board transfer with assist x 1 for more independent and functional mobility Baseline:  Goal status: NOT MET, GOAL D/C   3.  Pt will be able to tolerate static standing with LRAD x 5 min for improved functional mobility and for pressure relief/offloading Baseline:  Goal status: MET   4.  Pt will score </= 24/50 on Modified Oswestry to demonstrate decreased severity of disability Baseline: 29/50 (06/30/22), 25/50 (8/23) Goal status: IN PROGRESS   NEW LONG TERM GOALS: Target date: 08/25/22***  Pt will be independent with home stretching program for improved LE ROM to allow more functional and independent mobility  Baseline:  Goal status: MET   2.  Pt will be able to perform slide board transfer with assist x 1 for more independent and functional mobility Baseline:  Goal status: INTO MET, GOAL D/C   3.  Pt will be able to tolerate static standing with LRAD x 5 min for improved functional mobility and for pressure relief/offloading Baseline:  Goal status: MET   4.  Pt will  score </= 24/50 on Modified Oswestry to demonstrate decreased severity of disability Baseline: 29/50 (06/30/22), 25/50 (8/23) Goal status: IN PROGRESS     ASSESSMENT:   CLINICAL IMPRESSION: Emphasis of skilled PT session on performing STG assessment including administering Oswestry, working on standing with use of Sara Plus, and assessing pt's ability to perform stretching HEP. Pt has met 2/4 original LTG including being independent with her home stretching program and tolerating standing up to 5 min with use of Sara Plus and skilled assist x 2. Pt is making progress towards decreasing her overall disability level as evidence by an improvement in her score on the Oswestry from 29/50 upon initial eval on 06/30/22 to 25/50 this date.Pt unable to complete safe slide board transfers at this time due to ongoing impaired sitting balance and decreased muscle strength and ROM. Pt also unable to perform slide board transfers due to chronic sacral wounds, therefor goal d/c at this time. Created new LTG due to progres *** Continue POC.     OBJECTIVE IMPAIRMENTS decreased balance, decreased endurance, decreased mobility, difficulty walking, decreased ROM, decreased strength, obesity, and pain.    ACTIVITY LIMITATIONS carrying, lifting, bending, standing, stairs, transfers, bed mobility, bathing, toileting, dressing, and locomotion level   PARTICIPATION LIMITATIONS: meal prep, cleaning, laundry, driving, community activity, and yard work   [place], Past/current experiences, Time since onset of injury/illness/exacerbation, Transportation, and 1-2 comorbidities: HTN, endometrial CA, GERD, MS  are also affecting patient's functional outcome.    REHAB POTENTIAL: Fair pt has not been able to stand or ambulate in 10+ years, has a goal to be able to stand and pivot for transfers. Pt has decreased LE ROM and strength.   CLINICAL DECISION MAKING: Stable/uncomplicated   EVALUATION COMPLEXITY:  Moderate   PLAN: PT FREQUENCY: 1x/week   PT DURATION: 8 weeks (schedule for 4 weeks then reassess to see if appropriate to continue)   PLANNED INTERVENTIONS: Therapeutic exercises, Therapeutic activity, Neuromuscular re-education, Balance training, Gait training, Patient/Family education, Self Care, Joint mobilization, Orthotic/Fit training, DME instructions, Dry Needling, Electrical stimulation, Wheelchair mobility training, Cryotherapy, Moist heat, Taping, Manual therapy, and Re-evaluation   PLAN FOR NEXT SESSION: add to stretching program and/or add strength exercises, sitting balance on mat table (will need to hoyer transfer), standing frame (350# weight limit, need larger sling) vs Clarise Cruz Plus, resisted LE strengthening ***    Excell Seltzer, PT, DPT, CSRS  07/28/2022, 2:58 PM

## 2022-08-04 ENCOUNTER — Ambulatory Visit: Payer: Medicare Other | Admitting: Physical Therapy

## 2022-08-04 DIAGNOSIS — M545 Low back pain, unspecified: Secondary | ICD-10-CM

## 2022-08-04 DIAGNOSIS — M79661 Pain in right lower leg: Secondary | ICD-10-CM

## 2022-08-04 DIAGNOSIS — M79662 Pain in left lower leg: Secondary | ICD-10-CM

## 2022-08-04 DIAGNOSIS — G35 Multiple sclerosis: Secondary | ICD-10-CM

## 2022-08-04 NOTE — Therapy (Signed)
OUTPATIENT PHYSICAL THERAPY TREATMENT NOTE   Patient Name: [name] MRN: [id number] DOB:11-May-1961, 61 y.o., female Today's Date: 08/04/2022  PCP: Porfirio Oar, PA  REFERRING PROVIDER: Porfirio Oar, PA   END OF SESSION:   PT End of Session - 08/04/22 1449     Visit Number 6    Number of Visits 9    Date for PT Re-Evaluation 08/25/22    Authorization Type UHC Medicare    Progress Note Due on Visit 10    PT Start Time 1448    PT Stop Time 1537    PT Time Calculation (min) 49 min    Equipment Utilized During Treatment Other (comment)   manual hoyer   Activity Tolerance Patient tolerated treatment well    Behavior During Therapy WFL for tasks assessed/performed                 Past Medical History:  Diagnosis Date   Anemia    history of   Arthritis    Decubitus ulcers    currently treated at wound center   Diabetes mellitus without complication ([facility])    Diabetic polyneuropathy ([facility])    Endometrial cancer (HCC)    Grade 2   Fibroids    GERD (gastroesophageal reflux disease)    Heartburn    Occ   History of radiation therapy 03/20/2019   02/14/2019 - 03/20/2019 Pelvis / 45 Gy in 25 fractions  Dr Antony Blackbird   History of radiation therapy 04/11/2019   03/27/2019, 04/04/2019, 04/11/2019 (HDR, [facility]achy): Vaginal Cuff (cylinder, 3 cm diameter) / 18 Gy in 3 fractions (3 cm tx length) Dr Antony Blackbird   Hypertension    Mixed hyperlipidemia    Morbid obesity ([facility]CC)    MS (multiple sclerosis) ([facility])    Past Surgical History:  Procedure Laterality Date   BRAIN TUMOR EXCISION  09/2020   COLONOSCOPY     CYSTOSCOPY N/A 01/22/2020   Procedure: CYSTOSCOPY WITH SUPRA PUBIC TUBE CHANGE, BOTOX 200 UNITS, RETROGRADE PYELOGRAM BILATERAL. FULGERATION .5 CM-2CM;  Surgeon: Jerilee Field, MD;  [facility];  Service: Urology;  Laterality: N/A;   ESOPHAGOGASTRODUODENOSCOPY N/A 12/02/2017   Procedure: ESOPHAGOGASTRODUODENOSCOPY (EGD);  Surgeon: Kerin Salen, MD;   Location: [facility];  Service: Gastroenterology;  Laterality: N/A;   ROBOTIC ASSISTED TOTAL HYSTERECTOMY WITH BILATERAL SALPINGO OOPHERECTOMY Bilateral 12/19/2018   Procedure: XI ROBOTIC ASSISTED TOTAL HYSTERECTOMY (UTERUS GREATER THAN 250gr) WITH BILATERAL SALPINGO OOPHORECTOMY;  Surgeon: Adolphus Birchwood, MD;  Location[facility];  Service: Gynecology;  Laterality: Bilateral;   SENTINEL NODE BIOPSY N/A 12/19/2018   Procedure: SENTINEL NODE BIOPSY;  Surgeon: Adolphus Birchwood, MD;  Location[facility];  Service: Gynecology;  Laterality: N/A;   SUPRAPUBIC CATHETER INSERTION  2009   TONSILLECTOMY     TUBAL LIGATION     Patient Active Problem List   Diagnosis Date Noted   Pressure injury of skin 05/02/2022   Angioedema of lips 05/02/2022   Angioedema of intestine due to angiotensin converting enzyme inhibitor (ACE-I) 05/01/2022   Rectal pain 03/17/2022   Ganglion cyst of volar aspect of left wrist 01/01/2022   Ganglion cyst of wrist, right 01/01/2022   Bilateral wrist pain 01/01/2022   Chronic midline low back pain without sciatica 09/10/2021   Meningioma (HCC) 12/17/2020   Cerebral meningioma (HCC) 05/19/2020   Nuclear sclerotic cataract of both eyes 01/23/2020   Endometrial cancer (HCC) 11/21/2018   Morbid obesity (HCC) 11/21/2018   Pain management contract agreement 02/16/2017   Mixed hyperlipidemia 11/24/2016  Type 2 diabetes mellitus with diabetic polyneuropathy, without long-term current use of insulin ([place]) 11/24/2016   Multiple sclerosis ([place]) 11/18/2016   Leg weakness, bilateral 11/18/2016   Dysesthesia 11/18/2016   Diplegia of both lower extremities ([place]) 11/18/2016   Diabetes, polyneuropathy ([place]) 11/18/2016   Chronic indwelling Foley catheter 11/17/2016   Essential hypertension 11/17/2016   Slow transit constipation 11/17/2016   Chronically on opiate therapy 11/17/2016   Physical deconditioning 11/17/2016    REFERRING DIAG: M54.50 (ICD-10-CM) - Low back pain   THERAPY DIAG:   Chronic low back pain, unspecified back pain laterality, unspecified whether sciatica present  Multiple sclerosis (HCC)  Pain in left lower leg  Pain in right lower leg  Rationale for Evaluation and Treatment Rehabilitation  PERTINENT HISTORY: history of hypertension, endometrial cancer, GERD, multiple sclerosis   PRECAUTIONS: fall  SUBJECTIVE: Pt reports she was not feeling well this week and was not able to work on her home stretching program as much. No other changes since last session.  PAIN:  Are you having pain? Yes: NPRS scale: 4/10 Pain location: "waist down" Pain description: tingling/numbness, constant Aggravating factors: none Relieving factors: "not really"   TODAY'S TREATMENT:  THER ACT: Manual hoyer transfer PWC to/from mat table with assist x 2. Session focus on sitting balance EOM working towards decreased reliance on posterior support from therapist sitting on therapy ball. Pt unable to perform anterior lean without assist from BUE pulling on mat table. Pt is able to maintain static sitting balance without posterior support with her hands on her thighs. Progression to reaching outside BOS and across midline with alt UE for targets, encouragement to utilizing leaning with trunk to reach targets vs just reaching with UE. Progression to seated ball toss then seated dowel rod volleyball x 10-15 reps each before onset of fatigue. Pt does have difficulty maintaining dynamic sitting balance without external support.  Discussed POC with patient including importance of working on core strength/stability and sitting balance before pt can progress to standing more independently and transferring more independently. Pt in agreement with plan. Discussed how pt can start to work on more core strengthening and stability at home, added mini-crunches with use of hospital bed features to HEP, see bolded below. Pt to measure her mattress to floor height to assess safety of pt performing  seated balance EOB at home with assist from family.   PATIENT EDUCATION: Education details: continue HEP, added to HEP, POC Person educated: Patient Education method: Explanation and Handout Education comprehension: verbalized understanding     HOME EXERCISE PROGRAM: Access Code: [email or web address] URL: [email or web address] Date: 07/07/2022 Prepared by: Estevan Ryder  Exercises - Supine Hip and Knee Flexion PROM with Caregiver  - 1 x daily - 7 x weekly - 3 sets - 10 reps - Foot Dorsiflexion PROM Caregiver  - 1 x daily - 7 x weekly - 3 sets - 10 reps - Hip Abduction and Adduction Caregiver PROM  - 1 x daily - 7 x weekly - 3 sets - 10 reps - Hip Internal and External Rotation Caregiver PROM  - 1 x daily - 7 x weekly - 3 sets - 10 reps - Sidelying Hip Extension PROM with Caregiver  - 1 x daily - 7 x weekly - 3 sets - 10 reps - Curl Up in Hospital Bed with [facility] Elevated and Use of Bedrails - 1 x daily - 7 x weekly - 3 sets - 10 reps - 5 sec hold     GOALS: Goals reviewed  with patient? Yes   STG = LTG which are to be reassessed after 30 days   LONG TERM GOALS: Target date: 07/28/2022   Pt will be independent with home stretching program for improved LE ROM to allow more functional and independent mobility  Baseline:  Goal status: MET   2.  Pt will be able to perform slide board transfer with assist x 1 for more independent and functional mobility Baseline:  Goal status: NOT MET, GOAL D/C   3.  Pt will be able to tolerate static standing with LRAD x 5 min for improved functional mobility and for pressure relief/offloading Baseline:  Goal status: MET   4.  Pt will score </= 24/50 on Modified Oswestry to demonstrate decreased severity of disability Baseline: 29/50 (06/30/22), 25/50 (8/23) Goal status: IN PROGRESS   NEW LONG TERM GOALS: Target date: 08/25/22  Pt will be able to tolerate static standing with LRAD x 10 min for improved functional mobility and for pressure  relief/offloading Baseline: 5 min (8/23) Goal status: INITIAL   2.  Pt will be able to maintain static sitting balance x 5 min at SBA level to demonstrate improved independence with functional mobility Baseline:  Goal status: INITIAL   3.  Pt will score </= 21/50 on Modified Oswestry to demonstrate decreased severity of disability Baseline: 29/50 (06/30/22), 25/50 (8/23) Goal status: IN PROGRESS     ASSESSMENT:   CLINICAL IMPRESSION: Emphasis of skilled PT session on working on sitting balance and core strength/stability. Pt is able to maintain static sitting balance unsupported up to 2 min, needs external assist to maintain dynamic sitting balance. Pt continues to benefit from skilled therapy services to address ongoing core and LE weakness and decreased safety and independence with functional mobility. Continue POC.     OBJECTIVE IMPAIRMENTS decreased balance, decreased endurance, decreased mobility, difficulty walking, decreased ROM, decreased strength, obesity, and pain.    ACTIVITY LIMITATIONS carrying, lifting, bending, standing, stairs, transfers, bed mobility, bathing, toileting, dressing, and locomotion level   PARTICIPATION LIMITATIONS: meal prep, cleaning, laundry, driving, community activity, and yard work   [place], Past/current experiences, Time since onset of injury/illness/exacerbation, Transportation, and 1-2 comorbidities: HTN, endometrial CA, GERD, MS  are also affecting patient's functional outcome.    REHAB POTENTIAL: Fair pt has not been able to stand or ambulate in 10+ years, has a goal to be able to stand and pivot for transfers. Pt has decreased LE ROM and strength.   CLINICAL DECISION MAKING: Stable/uncomplicated   EVALUATION COMPLEXITY: Moderate   PLAN: PT FREQUENCY: 1x/week   PT DURATION: 8 weeks (schedule for 4 weeks then reassess to see if appropriate to continue)   PLANNED INTERVENTIONS: Therapeutic exercises, Therapeutic activity,  Neuromuscular re-education, Balance training, Gait training, Patient/Family education, Self Care, Joint mobilization, Orthotic/Fit training, DME instructions, Dry Needling, Electrical stimulation, Wheelchair mobility training, Cryotherapy, Moist heat, Taping, Manual therapy, and Re-evaluation   PLAN FOR NEXT SESSION: add to stretching program and/or add strength exercises, sitting balance on mat table (will need to hoyer transfer), standing frame (350# weight limit, need larger sling) vs Clarise Cruz Plus, resisted LE strengthening, add to HEP with seated core strengthening therex in PWC vs EOB?       Excell Seltzer, PT, DPT, CSRS 08/04/2022, 3:48 PM

## 2022-08-11 ENCOUNTER — Ambulatory Visit: Payer: Medicare Other

## 2022-08-13 ENCOUNTER — Ambulatory Visit: Payer: Medicare Other | Attending: Physician Assistant

## 2022-08-13 DIAGNOSIS — M545 Low back pain, unspecified: Secondary | ICD-10-CM | POA: Insufficient documentation

## 2022-08-13 DIAGNOSIS — M79662 Pain in left lower leg: Secondary | ICD-10-CM | POA: Diagnosis present

## 2022-08-13 DIAGNOSIS — M79661 Pain in right lower leg: Secondary | ICD-10-CM | POA: Diagnosis present

## 2022-08-13 DIAGNOSIS — G35 Multiple sclerosis: Secondary | ICD-10-CM | POA: Insufficient documentation

## 2022-08-13 DIAGNOSIS — G8929 Other chronic pain: Secondary | ICD-10-CM | POA: Diagnosis present

## 2022-08-13 NOTE — Therapy (Signed)
OUTPATIENT PHYSICAL THERAPY TREATMENT NOTE   Patient Name: [name] MRN: [id number] DOB:09-10-1961, 61 y.o., female Today's Date: 08/13/2022  PCP: Harrison Mons, PA  REFERRING PROVIDER: Harrison Mons, PA   END OF SESSION:   PT End of Session - 08/13/22 1317     Visit Number 7    Number of Visits 9    Date for PT Re-Evaluation 08/25/22    Authorization Type UHC Medicare    Progress Note Due on Visit 10    PT Start Time 1315    PT Stop Time 1400    PT Time Calculation (min) 45 min    Activity Tolerance Patient tolerated treatment well    Behavior During Therapy WFL for tasks assessed/performed             Past Medical History:  Diagnosis Date   Anemia    history of   Arthritis    Decubitus ulcers    currently treated at wound center   Diabetes mellitus without complication ([place])    Diabetic polyneuropathy ([place])    Endometrial cancer ([place])    Grade 2   Fibroids    GERD (gastroesophageal reflux disease)    Heartburn    Occ   History of radiation therapy 03/20/2019   02/14/2019 - 03/20/2019 Pelvis / 45 Gy in 25 fractions  Dr Gery Pray   History of radiation therapy 04/11/2019   03/27/2019, 04/04/2019, 04/11/2019 (HDR, Brachy): Vaginal Cuff (cylinder, 3 cm diameter) / 18 Gy in 3 fractions (3 cm tx length) Dr Gery Pray   Hypertension    Mixed hyperlipidemia    Morbid obesity ([place])    MS (multiple sclerosis) ([place])    Past Surgical History:  Procedure Laterality Date   BRAIN TUMOR EXCISION  09/2020   COLONOSCOPY     CYSTOSCOPY N/A 01/22/2020   Procedure: CYSTOSCOPY WITH SUPRA PUBIC TUBE CHANGE, BOTOX 200 UNITS, RETROGRADE PYELOGRAM BILATERAL. FULGERATION .5 CM-2CM;  Surgeon: Festus Aloe, MD;  Location: [facility];  Service: Urology;  Laterality: N/A;   ESOPHAGOGASTRODUODENOSCOPY N/A 12/02/2017   Procedure: ESOPHAGOGASTRODUODENOSCOPY (EGD);  Surgeon: Ronnette Juniper, MD;  Location: [facility];  Service: Gastroenterology;  Laterality: N/A;   ROBOTIC  ASSISTED TOTAL HYSTERECTOMY WITH BILATERAL SALPINGO OOPHERECTOMY Bilateral 12/19/2018   Procedure: XI ROBOTIC ASSISTED TOTAL HYSTERECTOMY (UTERUS GREATER THAN 250gr) WITH BILATERAL SALPINGO OOPHORECTOMY;  Surgeon: Everitt Amber, MD;  [facility];  Service: Gynecology;  Laterality: Bilateral;   SENTINEL NODE BIOPSY N/A 12/19/2018   Procedure: SENTINEL NODE BIOPSY;  Surgeon: Everitt Amber, MD;  [facility];  Service: Gynecology;  Laterality: N/A;   SUPRAPUBIC CATHETER INSERTION  2009   TONSILLECTOMY     TUBAL LIGATION     Patient Active Problem List   Diagnosis Date Noted   Pressure injury of skin 05/02/2022   Angioedema of lips 05/02/2022   Angioedema of intestine due to angiotensin converting enzyme inhibitor (ACE-I) 05/01/2022   Rectal pain 03/17/2022   Ganglion cyst of volar aspect of left wrist 01/01/2022   Ganglion cyst of wrist, right 01/01/2022   Bilateral wrist pain 01/01/2022   Chronic midline low back pain without sciatica 09/10/2021   Meningioma ([place]) 12/17/2020   Cerebral meningioma ([place]) 05/19/2020   Nuclear sclerotic cataract of both eyes 01/23/2020   Endometrial cancer ([place]) 11/21/2018   Morbid obesity ([place]) 11/21/2018   Pain management contract agreement 02/16/2017   Mixed hyperlipidemia 11/24/2016   Type 2 diabetes mellitus with diabetic polyneuropathy, without long-term current use of insulin ([place]) 11/24/2016  Multiple sclerosis ([place]) 11/18/2016   Leg weakness, bilateral 11/18/2016   Dysesthesia 11/18/2016   Diplegia of both lower extremities ([place]) 11/18/2016   Diabetes, polyneuropathy ([place]) 11/18/2016   Chronic indwelling Foley catheter 11/17/2016   Essential hypertension 11/17/2016   Slow transit constipation 11/17/2016   Chronically on opiate therapy 11/17/2016   Physical deconditioning 11/17/2016    REFERRING DIAG: M54.50 (ICD-10-CM) - Low back pain   THERAPY DIAG:  Chronic low back pain, unspecified back pain laterality, unspecified whether sciatica  present  Multiple sclerosis (HCC)  Pain in left lower leg  Pain in right lower leg  Rationale for Evaluation and Treatment Rehabilitation  PERTINENT HISTORY: history of hypertension, endometrial cancer, GERD, multiple sclerosis   PRECAUTIONS: fall  SUBJECTIVE: Patient reports doing fair. Wounds are still bothering her. Discussed height of patients bed (31") and the safety surrounding potentially doing EOB exercises with dtr. Patient reports that she and dtr would not feel safe doing so.   PAIN:  Are you having pain? Yes: NPRS scale: 4/10 Pain location: "waist down" Pain description: tingling/numbness, constant Aggravating factors: none Relieving factors: "not really"   TODAY'S TREATMENT:   -Sara lift to stand x3 (30s-60s) progressing to alternating UE support   PATIENT EDUCATION: Education details: continue HEP Person educated: Patient Education method: Explanation and Handout Education comprehension: verbalized understanding     HOME EXERCISE PROGRAM: Access Code: [email or web address] URL: [email or web address] Date: 07/07/2022 Prepared by: Estevan Ryder  Exercises - Supine Hip and Knee Flexion PROM with Caregiver  - 1 x daily - 7 x weekly - 3 sets - 10 reps - Foot Dorsiflexion PROM Caregiver  - 1 x daily - 7 x weekly - 3 sets - 10 reps - Hip Abduction and Adduction Caregiver PROM  - 1 x daily - 7 x weekly - 3 sets - 10 reps - Hip Internal and External Rotation Caregiver PROM  - 1 x daily - 7 x weekly - 3 sets - 10 reps - Sidelying Hip Extension PROM with Caregiver  - 1 x daily - 7 x weekly - 3 sets - 10 reps - Curl Up in Hospital Bed with [facility] Elevated and Use of Bedrails - 1 x daily - 7 x weekly - 3 sets - 10 reps - 5 sec hold     GOALS: Goals reviewed with patient? Yes   STG = LTG which are to be reassessed after 30 days   LONG TERM GOALS: Target date: 07/28/2022   Pt will be independent with home stretching program for improved LE ROM to allow more  functional and independent mobility  Baseline:  Goal status: MET   2.  Pt will be able to perform slide board transfer with assist x 1 for more independent and functional mobility Baseline:  Goal status: NOT MET, GOAL D/C   3.  Pt will be able to tolerate static standing with LRAD x 5 min for improved functional mobility and for pressure relief/offloading Baseline:  Goal status: MET   4.  Pt will score </= 24/50 on Modified Oswestry to demonstrate decreased severity of disability Baseline: 29/50 (06/30/22), 25/50 (8/23) Goal status: IN PROGRESS   NEW LONG TERM GOALS: Target date: 08/25/22  Pt will be able to tolerate static standing with LRAD x 10 min for improved functional mobility and for pressure relief/offloading Baseline: 5 min (8/23) Goal status: INITIAL   2.  Pt will be able to maintain static sitting balance x 5 min at SBA level to demonstrate improved independence with  functional mobility Baseline:  Goal status: INITIAL   3.  Pt will score </= 21/50 on Modified Oswestry to demonstrate decreased severity of disability Baseline: 29/50 (06/30/22), 25/50 (8/23) Goal status: IN PROGRESS     ASSESSMENT:   CLINICAL IMPRESSION: Patient seen for skilled PT session with emphasis on standing tolerance and progressing B LE functional strength and posture. Patient tolerating progressing standing tolerance for ~60s. Patient able to actively engage B quads and glutes, but not sufficient to support self vs gravity. Patient may benefit from addition of shoe holders on her wheelchair to promote and maintain foot positioning. Continue POC.    OBJECTIVE IMPAIRMENTS decreased balance, decreased endurance, decreased mobility, difficulty walking, decreased ROM, decreased strength, obesity, and pain.    ACTIVITY LIMITATIONS carrying, lifting, bending, standing, stairs, transfers, bed mobility, bathing, toileting, dressing, and locomotion level   PARTICIPATION LIMITATIONS: meal prep, cleaning,  laundry, driving, community activity, and yard work   [place], Past/current experiences, Time since onset of injury/illness/exacerbation, Transportation, and 1-2 comorbidities: HTN, endometrial CA, GERD, MS  are also affecting patient's functional outcome.    REHAB POTENTIAL: Fair pt has not been able to stand or ambulate in 10+ years, has a goal to be able to stand and pivot for transfers. Pt has decreased LE ROM and strength.   CLINICAL DECISION MAKING: Stable/uncomplicated   EVALUATION COMPLEXITY: Moderate   PLAN: PT FREQUENCY: 1x/week   PT DURATION: 8 weeks (schedule for 4 weeks then reassess to see if appropriate to continue)   PLANNED INTERVENTIONS: Therapeutic exercises, Therapeutic activity, Neuromuscular re-education, Balance training, Gait training, Patient/Family education, Self Care, Joint mobilization, Orthotic/Fit training, DME instructions, Dry Needling, Electrical stimulation, Wheelchair mobility training, Cryotherapy, Moist heat, Taping, Manual therapy, and Re-evaluation   PLAN FOR NEXT SESSION: add to stretching program and/or add strength exercises, sitting balance on mat table (will need to hoyer transfer), standing frame (350# weight limit, need larger sling) vs Clarise Cruz Plus, resisted LE strengthening      Debbora Dus, PT, DPT Debbora Dus, PT, DPT, CBIS  08/13/2022, 2:14 PM

## 2022-08-16 ENCOUNTER — Other Ambulatory Visit: Payer: Self-pay | Admitting: Gastroenterology

## 2022-08-18 ENCOUNTER — Ambulatory Visit: Payer: Medicare Other | Admitting: Physical Therapy

## 2022-08-18 DIAGNOSIS — G35 Multiple sclerosis: Secondary | ICD-10-CM

## 2022-08-18 DIAGNOSIS — M545 Low back pain, unspecified: Secondary | ICD-10-CM | POA: Diagnosis not present

## 2022-08-18 DIAGNOSIS — G8929 Other chronic pain: Secondary | ICD-10-CM

## 2022-08-18 DIAGNOSIS — M79662 Pain in left lower leg: Secondary | ICD-10-CM

## 2022-08-18 DIAGNOSIS — M79661 Pain in right lower leg: Secondary | ICD-10-CM

## 2022-08-18 NOTE — Therapy (Signed)
OUTPATIENT PHYSICAL THERAPY TREATMENT NOTE   Patient Name: [name] MRN: [id number] DOB:Dec 14, 1960, 61 y.o., female Today's Date: 08/18/2022  PCP: Harrison Mons, PA  REFERRING PROVIDER: Harrison Mons, PA   END OF SESSION:   PT End of Session - 08/18/22 1402     Visit Number 8    Number of Visits 9    Date for PT Re-Evaluation 08/25/22    Authorization Type UHC Medicare    Progress Note Due on Visit 10    PT Start Time 1401    PT Stop Time 1445    PT Time Calculation (min) 44 min    Activity Tolerance Patient tolerated treatment well    Behavior During Therapy WFL for tasks assessed/performed              Past Medical History:  Diagnosis Date   Anemia    history of   Arthritis    Decubitus ulcers    currently treated at wound center   Diabetes mellitus without complication ([place])    Diabetic polyneuropathy ([place])    Endometrial cancer ([place])    Grade 2   Fibroids    GERD (gastroesophageal reflux disease)    Heartburn    Occ   History of radiation therapy 03/20/2019   02/14/2019 - 03/20/2019 Pelvis / 45 Gy in 25 fractions  Dr Gery Pray   History of radiation therapy 04/11/2019   03/27/2019, 04/04/2019, 04/11/2019 (HDR, Brachy): Vaginal Cuff (cylinder, 3 cm diameter) / 18 Gy in 3 fractions (3 cm tx length) Dr Gery Pray   Hypertension    Mixed hyperlipidemia    Morbid obesity ([place])    MS (multiple sclerosis) ([place])    Past Surgical History:  Procedure Laterality Date   BRAIN TUMOR EXCISION  09/2020   COLONOSCOPY     CYSTOSCOPY N/A 01/22/2020   Procedure: CYSTOSCOPY WITH SUPRA PUBIC TUBE CHANGE, BOTOX 200 UNITS, RETROGRADE PYELOGRAM BILATERAL. FULGERATION .5 CM-2CM;  Surgeon: Festus Aloe, MD;  [facility];  Service: Urology;  Laterality: N/A;   ESOPHAGOGASTRODUODENOSCOPY N/A 12/02/2017   Procedure: ESOPHAGOGASTRODUODENOSCOPY (EGD);  Surgeon: Ronnette Juniper, MD;  Location: [facility];  Service: Gastroenterology;  Laterality: N/A;   ROBOTIC  ASSISTED TOTAL HYSTERECTOMY WITH BILATERAL SALPINGO OOPHERECTOMY Bilateral 12/19/2018   Procedure: XI ROBOTIC ASSISTED TOTAL HYSTERECTOMY (UTERUS GREATER THAN 250gr) WITH BILATERAL SALPINGO OOPHORECTOMY;  Surgeon: Everitt Amber, MD;  [facility];  Service: Gynecology;  Laterality: Bilateral;   SENTINEL NODE BIOPSY N/A 12/19/2018   Procedure: SENTINEL NODE BIOPSY;  Surgeon: Everitt Amber, MD;  [facility];  Service: Gynecology;  Laterality: N/A;   SUPRAPUBIC CATHETER INSERTION  2009   TONSILLECTOMY     TUBAL LIGATION     Patient Active Problem List   Diagnosis Date Noted   Pressure injury of skin 05/02/2022   Angioedema of lips 05/02/2022   Angioedema of intestine due to angiotensin converting enzyme inhibitor (ACE-I) 05/01/2022   Rectal pain 03/17/2022   Ganglion cyst of volar aspect of left wrist 01/01/2022   Ganglion cyst of wrist, right 01/01/2022   Bilateral wrist pain 01/01/2022   Chronic midline low back pain without sciatica 09/10/2021   Meningioma ([place]) 12/17/2020   Cerebral meningioma ([place]) 05/19/2020   Nuclear sclerotic cataract of both eyes 01/23/2020   Endometrial cancer ([place]) 11/21/2018   Morbid obesity ([place]) 11/21/2018   Pain management contract agreement 02/16/2017   Mixed hyperlipidemia 11/24/2016   Type 2 diabetes mellitus with diabetic polyneuropathy, without long-term current use of insulin ([place]) 11/24/2016  Multiple sclerosis ([place]) 11/18/2016   Leg weakness, bilateral 11/18/2016   Dysesthesia 11/18/2016   Diplegia of both lower extremities ([place]) 11/18/2016   Diabetes, polyneuropathy ([place]) 11/18/2016   Chronic indwelling Foley catheter 11/17/2016   Essential hypertension 11/17/2016   Slow transit constipation 11/17/2016   Chronically on opiate therapy 11/17/2016   Physical deconditioning 11/17/2016    REFERRING DIAG: M54.50 (ICD-10-CM) - Low back pain   THERAPY DIAG:  Chronic low back pain, unspecified back pain laterality, unspecified whether sciatica  present  Multiple sclerosis (HCC)  Pain in left lower leg  Pain in right lower leg  Rationale for Evaluation and Treatment Rehabilitation  PERTINENT HISTORY: history of hypertension, endometrial cancer, GERD, multiple sclerosis   PRECAUTIONS: fall  SUBJECTIVE: Pt reports her hospital bed broke (motor not working, cannot raise/lower bed and cannot elevate HOB). Pt reports she was able to get an order from her doctor for a new bed but has to work with insurance on getting a new one. Pt reports increase in her pain/discomfort as well as her ability to offload her sacral wounds due to bed malfunction.  PAIN:  Are you having pain? Yes: NPRS scale: 5/10 Pain location: "waist down" Pain description: tingling/numbness, constant Aggravating factors: none Relieving factors: "not really"   TODAY'S TREATMENT:     THER EX: Added to patient's HEP to include core strengthening and stretching that she can do while seated in her PWC due to her hospital bed features not working as well as to increase her independence with ability to perform her HEP. See bolded exercises below.  Seated lateral leans in PWC to mat table (elevated with 2 pillows) x 5 reps each direction. Pt does require assist to move PWC armrests out of the way.  Seated anterior leans onto elevated mat table with focus on stretching low back and lats, 5 x 30 sec each.  Seated "mini-crunch"/anterior lean from back of chair to upright position with focus on activation of core musculature and decreased reliance on BUE to attain position. Pt exhibits improved ability to maintain upright position with increase in repetitions and is able to hold for up to 10 sec.  THER ACT: Discussed pt's current PT POC and that next visit will be her last visit. Educated pt that even though her initial goal for therapy was to be able to stand and perform a stand pivot transfer that is not realistic for her at this point. Pt agrees as she has been  sedentary for 10 years and is agreement that main focus of her rehab at this point is increasing her flexibility and core strength in order to increase her independence with mobility and decrease her pain. Encouraged pt to continue with HEP in order to continue to increase her flexibility and increase her core strength before considering return to PT services to address future goals. Pt in agreement to d/c from PT services next session.    PATIENT EDUCATION: Education details: continue HEP, added to HEP Person educated: Patient Education method: Explanation and Handout Education comprehension: verbalized understanding     HOME EXERCISE PROGRAM:  Access Code: [email or web address] URL: [email or web address] Date: 08/18/2022 Prepared by: Excell Seltzer  Exercises - Supine Hip and Knee Flexion PROM with Caregiver  - 1 x daily - 7 x weekly - 3 sets - 10 reps - Foot Dorsiflexion PROM Caregiver  - 1 x daily - 7 x weekly - 3 sets - 10 reps - Hip Abduction and Adduction Caregiver PROM  - 1 x daily -  7 x weekly - 3 sets - 10 reps - Hip Internal and External Rotation Caregiver PROM  - 1 x daily - 7 x weekly - 3 sets - 10 reps - Sidelying Hip Extension PROM with Caregiver  - 1 x daily - 7 x weekly - 3 sets - 10 reps - Sidebending to Elbow Short Sit While Seated in PWC - 1 x daily - 7 x weekly - 3 sets - 5 reps - Seated  Anterior Leans onto Bed/Table  - 1 x daily - 7 x weekly - 1 sets - 5 reps - 30-60 sec hold - Seated Abdominal Lean Forward in Wheelchair  - 1 x daily - 7 x weekly - 1 sets - 10 reps - 30 sec hold     GOALS: Goals reviewed with patient? Yes   STG = LTG which are to be reassessed after 30 days   LONG TERM GOALS: Target date: 07/28/2022   Pt will be independent with home stretching program for improved LE ROM to allow more functional and independent mobility  Baseline:  Goal status: MET   2.  Pt will be able to perform slide board transfer with assist x 1 for more  independent and functional mobility Baseline:  Goal status: NOT MET, GOAL D/C   3.  Pt will be able to tolerate static standing with LRAD x 5 min for improved functional mobility and for pressure relief/offloading Baseline:  Goal status: MET   4.  Pt will score </= 24/50 on Modified Oswestry to demonstrate decreased severity of disability Baseline: 29/50 (06/30/22), 25/50 (8/23) Goal status: IN PROGRESS   NEW LONG TERM GOALS: Target date: 08/25/22  Pt will be able to tolerate static standing with LRAD x 10 min for improved functional mobility and for pressure relief/offloading Baseline: 5 min (8/23) Goal status: INITIAL   2.  Pt will be able to maintain static sitting balance x 5 min at SBA level to demonstrate improved independence with functional mobility Baseline:  Goal status: INITIAL   3.  Pt will score </= 21/50 on Modified Oswestry to demonstrate decreased severity of disability Baseline: 29/50 (06/30/22), 25/50 (8/23) Goal status: IN PROGRESS     ASSESSMENT:   CLINICAL IMPRESSION: Emphasis of skilled PT session on adding to pt's HEP to include core strengthening and stretching exercises that she can perform while seated in her PWC to decrease caregiver burden and increase patient independence with HEP. See bolded above for new exercises, handout provided. Also discussed pt's current POC including pt discharging from PT services next visit. Pt agreeable to continue working on exercises at home to improve strength, flexibility and decrease pain with return to therapy in a few months if warranted. Continue POC.     OBJECTIVE IMPAIRMENTS decreased balance, decreased endurance, decreased mobility, difficulty walking, decreased ROM, decreased strength, obesity, and pain.    ACTIVITY LIMITATIONS carrying, lifting, bending, standing, stairs, transfers, bed mobility, bathing, toileting, dressing, and locomotion level   PARTICIPATION LIMITATIONS: meal prep, cleaning, laundry,  driving, community activity, and yard work   [place], Past/current experiences, Time since onset of injury/illness/exacerbation, Transportation, and 1-2 comorbidities: HTN, endometrial CA, GERD, MS  are also affecting patient's functional outcome.    REHAB POTENTIAL: Fair pt has not been able to stand or ambulate in 10+ years, has a goal to be able to stand and pivot for transfers. Pt has decreased LE ROM and strength.   CLINICAL DECISION MAKING: Stable/uncomplicated   EVALUATION COMPLEXITY: Moderate  PLAN: PT FREQUENCY: 1x/week   PT DURATION: 8 weeks    PLANNED INTERVENTIONS: Therapeutic exercises, Therapeutic activity, Neuromuscular re-education, Balance training, Gait training, Patient/Family education, Self Care, Joint mobilization, Orthotic/Fit training, DME instructions, Dry Needling, Electrical stimulation, Wheelchair mobility training, Cryotherapy, Moist heat, Taping, Manual therapy, and Re-evaluation   PLAN FOR NEXT SESSION: assess HEP, assess LTG, d/c from PT      Excell Seltzer, PT, DPT, CSRS   08/18/2022, 2:46 PM

## 2022-08-23 DIAGNOSIS — Z79891 Long term (current) use of opiate analgesic: Secondary | ICD-10-CM | POA: Insufficient documentation

## 2022-08-25 ENCOUNTER — Ambulatory Visit: Payer: Medicare Other

## 2022-08-25 NOTE — Therapy (Unsigned)
[facility] [address] [place], [place], [address] Phone: [phone]   Fax:  [phone]  Patient Details  Name: [name] MRN: [id number] Date of Birth: [birth year] Referring Provider:  No ref. provider found  Encounter Date: 08/25/2022  PHYSICAL THERAPY DISCHARGE SUMMARY  Visits from Start of Care: 8  Current functional level related to goals / functional outcomes: Patient remains TotalA/dependent for all functional mobility including transfers, bed mobility and wheeled mobility using a PWC.    Remaining deficits: Limited endurance, decreased sitting balance, decreased functional LE strength, impaired tone   Education / Equipment: PT POC, HEP   Patient agrees to discharge. Patient goals were  unable to be assessed as patient did not complete dc assessment . Patient is being discharged due to not returning since the last visit.  Debbora Dus, PT Debbora Dus, PT, DPT, CBIS  08/25/2022, 2:18 PM  [facility] [address] [place], [place], [address] Phone: [phone]   Fax:  [phone]

## 2022-08-30 ENCOUNTER — Other Ambulatory Visit: Payer: Self-pay | Admitting: Physician Assistant

## 2022-08-30 DIAGNOSIS — Z1231 Encounter for screening mammogram for malignant neoplasm of breast: Secondary | ICD-10-CM

## 2022-09-01 ENCOUNTER — Encounter: Payer: Self-pay | Admitting: Podiatry

## 2022-09-01 ENCOUNTER — Ambulatory Visit (INDEPENDENT_AMBULATORY_CARE_PROVIDER_SITE_OTHER): Payer: Medicare Other | Admitting: Podiatry

## 2022-09-01 DIAGNOSIS — G35 Multiple sclerosis: Secondary | ICD-10-CM | POA: Diagnosis not present

## 2022-09-01 DIAGNOSIS — L84 Corns and callosities: Secondary | ICD-10-CM

## 2022-09-01 DIAGNOSIS — B351 Tinea unguium: Secondary | ICD-10-CM

## 2022-09-01 DIAGNOSIS — E0842 Diabetes mellitus due to underlying condition with diabetic polyneuropathy: Secondary | ICD-10-CM | POA: Diagnosis not present

## 2022-09-04 NOTE — Progress Notes (Signed)
  Subjective:  Patient ID: [name], female    DOB: May 15, 1961,  MRN: [id number]  [name] presents to clinic today for:  Chief Complaint  Patient presents with   Nail Problem    Diabetic foot care BS-Did not check A1C-7.1 PCP-Chelle Jeffery PCP Vst- 3 months ago    New problem(s): None.   PCP is Harrison Mons, [place] , and last visit was  June 16, 2022.  She has a new motorized chair.  Allergies  Allergen Reactions   Ace Inhibitors Other (See Comments)    Angioedema    Lisinopril Other (See Comments)    angioedema    Review of Systems: Negative except as noted in the HPI.  Objective: No changes noted in today's physical examination.  [name] is a pleasant 61 y.o. female morbidly obese in NAD. AAO x 3. Vascular Examination: Capillary refill time to digits immediate b/l. Palpable pedal pulses b/l LE. Pedal hair absent. No pain with calf compression b/l. Dependent edema noted b/l LE.  Dermatological Examination: Pedal skin is warm and supple b/l LE. No open wounds b/l LE. No interdigital macerations noted b/l LE. Toenails 1-5 b/l elongated, discolored, dystrophic, thickened, crumbly with subungual debris and tenderness to dorsal palpation. Hyperkeratotic lesion(s) submet head 5 left foot.  No erythema, no edema, no drainage, no fluctuance.   Musculoskeletal Examination: Flaccid lower extremity noted b/l lower extremities. Clawtoe deformity 2-5 bilaterally. Utilizes motorized chair for mobility assistance.  Neurological Examination: Protective sensation diminished with 10g monofilament b/l.  Assessment/Plan: 1. Onychomycosis   2. Callus   3. Multiple sclerosis ([place])   4. Diabetic polyneuropathy associated with diabetes mellitus due to underlying condition ([place])     No orders of the defined types were placed in this encounter.   -Consent given for treatment as described below: -Examined patient. -Continue diabetic foot care principles: inspect feet daily,  monitor glucose as recommended by PCP and/or Endocrinologist, and follow prescribed diet per PCP, Endocrinologist and/or dietician. -Patient to continue soft, supportive shoe gear daily. -Toenails 1-5 b/l were debrided in length and girth with sterile nail nippers and dremel without iatrogenic bleeding.  -Callus(es) submet head 5 left foot pared utilizing rotary bur without complication or incident. Total number pared =1. -Patient/POA to call should there be question/concern in the interim.   Return in about 3 months (around 12/01/2022).  Marzetta Board, DPM

## 2022-09-27 DIAGNOSIS — F1120 Opioid dependence, uncomplicated: Secondary | ICD-10-CM | POA: Insufficient documentation

## 2022-09-27 DIAGNOSIS — M533 Sacrococcygeal disorders, not elsewhere classified: Secondary | ICD-10-CM | POA: Insufficient documentation

## 2022-09-29 ENCOUNTER — Ambulatory Visit
Admission: RE | Admit: 2022-09-29 | Discharge: 2022-09-29 | Disposition: A | Discharge status: Home / Self Care | Payer: Medicare Other | Source: Ambulatory Visit | Attending: Physician Assistant | Admitting: Physician Assistant

## 2022-09-29 DIAGNOSIS — Z1231 Encounter for screening mammogram for malignant neoplasm of breast: Secondary | ICD-10-CM

## 2022-10-06 ENCOUNTER — Ambulatory Visit: Payer: Medicare Other | Admitting: Physical Therapy

## 2022-10-06 ENCOUNTER — Telehealth: Payer: Self-pay | Admitting: Physical Therapy

## 2022-10-06 DIAGNOSIS — G35 Multiple sclerosis: Secondary | ICD-10-CM | POA: Insufficient documentation

## 2022-10-06 DIAGNOSIS — M545 Low back pain, unspecified: Secondary | ICD-10-CM | POA: Insufficient documentation

## 2022-10-06 DIAGNOSIS — M79661 Pain in right lower leg: Secondary | ICD-10-CM | POA: Insufficient documentation

## 2022-10-06 DIAGNOSIS — M6281 Muscle weakness (generalized): Secondary | ICD-10-CM

## 2022-10-06 DIAGNOSIS — M79662 Pain in left lower leg: Secondary | ICD-10-CM | POA: Insufficient documentation

## 2022-10-06 DIAGNOSIS — G8929 Other chronic pain: Secondary | ICD-10-CM | POA: Insufficient documentation

## 2022-10-06 NOTE — Therapy (Signed)
OUTPATIENT PHYSICAL THERAPY NEURO EVALUATION- ARRIVED NO CHARGE   Patient Name: [name] MRN: [id number] DOB:[birth year], 61 y.o., female Today's Date: 10/06/2022   PCP: Harrison Mons, PA  REFERRING PROVIDER: Harrison Mons, PA    PT End of Session - 10/06/22 1456     Visit Number 1    Number of Visits 1    Authorization Type [employer] Medicare    PT Start Time 1450   Pt arrived late   PT Stop Time 1510    PT Time Calculation (min) 20 min    Activity Tolerance Patient tolerated treatment well    Behavior During Therapy WFL for tasks assessed/performed             Past Medical History:  Diagnosis Date   Anemia    history of   Arthritis    Decubitus ulcers    currently treated at wound center   Diabetes mellitus without complication ([place])    Diabetic polyneuropathy ([place])    Endometrial cancer ([place])    Grade 2   Fibroids    GERD (gastroesophageal reflux disease)    Heartburn    Occ   History of radiation therapy 03/20/2019   02/14/2019 - 03/20/2019 Pelvis / 45 Gy in 25 fractions  Dr Gery Pray   History of radiation therapy 04/11/2019   03/27/2019, 04/04/2019, 04/11/2019 (HDR, [facility]): Vaginal Cuff (cylinder, 3 cm diameter) / 18 Gy in 3 fractions (3 cm tx length) Dr Gery Pray   Hypertension    Mixed hyperlipidemia    Morbid obesity ([place])    MS (multiple sclerosis) ([place])    Past Surgical History:  Procedure Laterality Date   BRAIN TUMOR EXCISION  09/2020   COLONOSCOPY     CYSTOSCOPY N/A 01/22/2020   Procedure: CYSTOSCOPY WITH SUPRA PUBIC TUBE CHANGE, BOTOX 200 UNITS, RETROGRADE PYELOGRAM BILATERAL. FULGERATION .5 CM-2CM;  Surgeon: Festus Aloe, MD;  Location: [facility];  Service: Urology;  Laterality: N/A;   ESOPHAGOGASTRODUODENOSCOPY N/A 12/02/2017   Procedure: ESOPHAGOGASTRODUODENOSCOPY (EGD);  Surgeon: Ronnette Juniper, MD;  Location: [facility];  Service: Gastroenterology;  Laterality: N/A;   ROBOTIC ASSISTED TOTAL HYSTERECTOMY WITH BILATERAL SALPINGO  OOPHERECTOMY Bilateral 12/19/2018   Procedure: XI ROBOTIC ASSISTED TOTAL HYSTERECTOMY (UTERUS GREATER THAN 250gr) WITH BILATERAL SALPINGO OOPHORECTOMY;  Surgeon: Everitt Amber, MD;  [facility];  Service: Gynecology;  Laterality: Bilateral;   SENTINEL NODE BIOPSY N/A 12/19/2018   Procedure: SENTINEL NODE BIOPSY;  Surgeon: Everitt Amber, MD;  [facility];  Service: Gynecology;  Laterality: N/A;   SUPRAPUBIC CATHETER INSERTION  2009   TONSILLECTOMY     TUBAL LIGATION     Patient Active Problem List   Diagnosis Date Noted   Encounter for long-term use of opiate analgesic 08/23/2022   Pressure injury of skin 05/02/2022   Angioedema of lips 05/02/2022   Angioedema of intestine due to angiotensin converting enzyme inhibitor (ACE-I) 05/01/2022   Rectal pain 03/17/2022   Ganglion cyst of volar aspect of left wrist 01/01/2022   Ganglion cyst of wrist, right 01/01/2022   Bilateral wrist pain 01/01/2022   Chronic midline low back pain without sciatica 09/10/2021   Meningioma ([place]) 12/17/2020   Cerebral meningioma ([place]) 05/19/2020   Nuclear sclerotic cataract of both eyes 01/23/2020   Endometrial cancer ([place]) 11/21/2018   Morbid obesity ([place]) 11/21/2018   Pain management contract agreement 02/16/2017   Mixed hyperlipidemia 11/24/2016   Type 2 diabetes mellitus with diabetic polyneuropathy, without long-term current use of insulin ([place]) 11/24/2016   Multiple sclerosis (  [place]) 11/18/2016   Leg weakness, bilateral 11/18/2016   Dysesthesia 11/18/2016   Diplegia of both lower extremities ([place]) 11/18/2016   Diabetes, polyneuropathy ([place]) 11/18/2016   Chronic indwelling Foley catheter 11/17/2016   Essential hypertension 11/17/2016   Slow transit constipation 11/17/2016   Chronically on opiate therapy 11/17/2016   Physical deconditioning 11/17/2016    ONSET DATE: 09/22/2022 (referral)   REFERRING DIAG: R53.81 (ICD-10-CM) - Physical deconditioning   THERAPY DIAG:  Muscle weakness  (generalized)  Rationale for Evaluation and Treatment: Rehabilitation  SUBJECTIVE:                                                                                                                                                                                             SUBJECTIVE STATEMENT: Pt presents in power chair and states her hospital bed is still broken and she missed her wound care appointment this past Monday due to transportation. Pt reports her pressure wounds are much worse and very painful and she also needs a new WC cushion for pressure relief. At this time, pt and therapist in agreement that [place] is not appropriate as pt needs to have her wounds healed and an operational hospital bed to promote pressure relief strategies. Discussed that pt needs her basic needs to be met first prior to starting OPPT and that [place] may be more appropriate to assist w/pressure relief at home and bed mobility. Pt in agreement to do HHPT to assist with pressure relief strategies and stretching until wounds are healed and she received new bed and WC cushion. Will send referral request to PCP today.    Pt accompanied by: self  PERTINENT HISTORY: history of hypertension, endometrial cancer, GERD, multiple sclerosis     Rithwik Schmieg E Gleb Mcguire, PT, DPT 10/06/2022, 3:25 PM

## 2022-10-20 ENCOUNTER — Other Ambulatory Visit: Payer: Self-pay | Admitting: Gastroenterology

## 2022-11-08 DIAGNOSIS — L89153 Pressure ulcer of sacral region, stage 3: Secondary | ICD-10-CM | POA: Insufficient documentation

## 2022-11-09 ENCOUNTER — Telehealth: Payer: Self-pay | Admitting: *Deleted

## 2022-11-09 ENCOUNTER — Telehealth: Payer: Self-pay

## 2022-11-09 NOTE — Telephone Encounter (Signed)
CALLED PATIENT TO INFORM OF FU WITH DR. Berline Lopes ON 12-17-22- ARRIVAL TIME- 2:15 PM, SPOKE WITH PATIENT AND SHE IS AWARE OF THIS APPT.

## 2022-11-09 NOTE — Telephone Encounter (Signed)
Enid Derry from ([facility]) called office.  Pt is scheduled for a follow up on 12/17/22  at 2:30 with Dr. Berline Lopes.  Enid Derry to notify pt of appointment date and time.

## 2022-11-26 ENCOUNTER — Other Ambulatory Visit: Payer: Self-pay | Admitting: Gastroenterology

## 2022-12-17 ENCOUNTER — Other Ambulatory Visit: Payer: Self-pay

## 2022-12-17 ENCOUNTER — Encounter: Payer: Self-pay | Admitting: Gynecologic Oncology

## 2022-12-17 ENCOUNTER — Inpatient Hospital Stay: Payer: Medicare Other | Attending: Gynecologic Oncology | Admitting: Gynecologic Oncology

## 2022-12-17 VITALS — BP 147/61 | HR 87 | Temp 99.1°F | Resp 14 | Wt 279.0 lb

## 2022-12-17 DIAGNOSIS — Z923 Personal history of irradiation: Secondary | ICD-10-CM | POA: Insufficient documentation

## 2022-12-17 DIAGNOSIS — Z08 Encounter for follow-up examination after completed treatment for malignant neoplasm: Secondary | ICD-10-CM | POA: Insufficient documentation

## 2022-12-17 DIAGNOSIS — Z90722 Acquired absence of ovaries, bilateral: Secondary | ICD-10-CM | POA: Diagnosis not present

## 2022-12-17 DIAGNOSIS — C541 Malignant neoplasm of endometrium: Secondary | ICD-10-CM

## 2022-12-17 DIAGNOSIS — Z8542 Personal history of malignant neoplasm of other parts of uterus: Secondary | ICD-10-CM | POA: Insufficient documentation

## 2022-12-17 DIAGNOSIS — Z90710 Acquired absence of both cervix and uterus: Secondary | ICD-10-CM | POA: Diagnosis not present

## 2022-12-17 NOTE — Progress Notes (Signed)
Gynecologic Oncology Return Clinic Visit  12/17/22  Reason for Visit: Surveillance visit in the setting of high-intermediate risk uterine cancer   Treatment History: Ms Melane Windholz is a 62 year old P1 who is seen in consultation at the request of Dr Elly Modena for grade 2 endometrial cancer.   The patient reports a history of vaginal bleeding for 6 months (since the summer of 2019).  Formed a transvaginal ultrasound scan on November 15, 2018 which revealed a uterus measuring 11.5 x 7.2 x 7.3 cm with a posterior lower uterine segment myoma measuring approximately 6 cm.  The endometrium was thickened to 34 mm.  The left and right ovaries were not clearly visualized.  An endometrial biopsy was then taken in the office which revealed FIGO grade 2 endometrioid adenocarcinoma.   The patient has a medical history significant for multiple sclerosis.  She is wheelchair-bound.  She has a suprapubic catheter.  She has spasms and no function in her lower extremities. She lives with her daughter.  She is independent through the day in her motorized wheelchair.  She requires Hoyer lift for transfers.     A suprapubic catheter was placed in 2009 and then later replaced.  Her neurologist is Dr. Felecia Shelling. Endometrial biopsy was performed on 11/10/18 which showed FIGO grade 2 endometrial cancer. [place] on November 15, 2018 showed a uterus measuring 11.5 x 7.2 x 7.3 cm with a 5 cm posterior leiomyoma in the mid to lower uterine segment.  There was a thickened endometrium at 34 mm.  The ovaries were not visualized.  There is no free fluid.  On 12/19/18 she underwent robotic assisted total hysterectomy BSO sentinel lymph node biopsy.  The uterus is greater than 250 g.  The procedure was complicated by morbid obesity and multiple sclerosis, paralysis.   Final pathology revealed a FIGO grade 2 endometrial cancer with outer half myometrial invasion (30 mm of 40 mm depth invasion close ).  LVSI was identified.  There was carcinoma  involving the lower uterine segment.  The tumor size was 9.5 cm.  Ovaries and fallopian tubes and cervix were unremarkable.  There were no macro micrometastases and sentinel lymph nodes however isolated tumor cells were identified in the left obturator sentinel lymph node.   She was staged as FIGO stage Ib grade 2 adenocarcinoma of the endometrium with high risk features and recommendation for adjuvant radiation was made in accordance with NCCN guidelines.   She received adjuvant radiation for her high risk early stage endometrial cancer. Radiation treatment dates:   1. IMRT: 02/14/19-03/20/19                                                  2. HDR Ir-192: 03/27/19, 04/04/19, 04/11/19    Site/dose: 1. Pelvis; 25 fractions of 1.8 Gy for a total of 45 Gy                    2. Vagina, 6 Gy in 3 fractions for a total dose of 18 Gy   She had brain surgery in October, 2021 for a benign brain tumor. This was at [facility]. She did well from surgery with no complications.    Interval History: Patient reports overall doing well.  She denies any vaginal bleeding.  Has had a little bit of vaginal irritation recently.  Denies any  discharge.  Continues to have suprapubic catheter in place which she is having change soon.  Reports baseline constipation.  Denies any significant abdominal or pelvic pain.  Past Medical/Surgical History: Past Medical History:  Diagnosis Date   Anemia    history of   Arthritis    Decubitus ulcers    currently treated at wound center   Diabetes mellitus without complication ([place])    Diabetic polyneuropathy ([place])    Endometrial cancer ([place])    Grade 2   Fibroids    GERD (gastroesophageal reflux disease)    Heartburn    Occ   History of radiation therapy 03/20/2019   02/14/2019 - 03/20/2019 Pelvis / 45 Gy in 25 fractions  Dr Gery Pray   History of radiation therapy 04/11/2019   03/27/2019, 04/04/2019, 04/11/2019 (HDR, [facility]): Vaginal Cuff (cylinder, 3 cm diameter) /  18 Gy in 3 fractions (3 cm tx length) Dr Gery Pray   Hypertension    Mixed hyperlipidemia    Morbid obesity ([place])    MS (multiple sclerosis) ([place])     Past Surgical History:  Procedure Laterality Date   BRAIN TUMOR EXCISION  09/2020   COLONOSCOPY     CYSTOSCOPY N/A 01/22/2020   Procedure: CYSTOSCOPY WITH SUPRA PUBIC TUBE CHANGE, BOTOX 200 UNITS, RETROGRADE PYELOGRAM BILATERAL. FULGERATION .5 CM-2CM;  Surgeon: Festus Aloe, MD;  [facility];  Service: Urology;  Laterality: N/A;   ESOPHAGOGASTRODUODENOSCOPY N/A 12/02/2017   Procedure: ESOPHAGOGASTRODUODENOSCOPY (EGD);  Surgeon: Ronnette Juniper, MD;  Location: [facility];  Service: Gastroenterology;  Laterality: N/A;   ROBOTIC ASSISTED TOTAL HYSTERECTOMY WITH BILATERAL SALPINGO OOPHERECTOMY Bilateral 12/19/2018   Procedure: XI ROBOTIC ASSISTED TOTAL HYSTERECTOMY (UTERUS GREATER THAN 250gr) WITH BILATERAL SALPINGO OOPHORECTOMY;  Surgeon: Everitt Amber, MD;  [facility];  Service: Gynecology;  Laterality: Bilateral;   SENTINEL NODE BIOPSY N/A 12/19/2018   Procedure: SENTINEL NODE BIOPSY;  Surgeon: Everitt Amber, MD;  [facility];  Service: Gynecology;  Laterality: N/A;   SUPRAPUBIC CATHETER INSERTION  2009   TONSILLECTOMY     TUBAL LIGATION      Family History  Problem Relation Age of Onset   Hypertension Mother    Breast cancer Mother        spread all over   Heart disease Father    Diabetes Father    Hypertension Sister    Hypertension Brother    Colon cancer Neg Hx    Esophageal cancer Neg Hx    Rectal cancer Neg Hx     Social History   Socioeconomic History   Marital status: Single    Spouse name: Not on file   Number of children: 3   Years of education: Not on file   Highest education level: Not on file  Occupational History   Occupation: Disabled  Tobacco Use   Smoking status: Never   Smokeless tobacco: Never  Vaping Use   Vaping Use: Never used  Substance and Sexual Activity   Alcohol use: No    Drug use: No   Sexual activity: Not on file  Other Topics Concern   Not on file  Social History Narrative   Not on file   Social Determinants of Health   Financial Resource Strain: Not on file  Food Insecurity: Not on file  Transportation Needs: Not on file  Physical Activity: Not on file  Stress: Not on file  Social Connections: Not on file    Current Medications:  Current Outpatient Medications:    ACCU-CHEK GUIDE  test strip, TEST 1 ONCE DAILY., Disp: , Rfl:    atorvastatin (LIPITOR) 10 MG tablet, Take 1 tablet by mouth daily., Disp: , Rfl:    baclofen (LIORESAL) 20 MG tablet, TAKE ONE TABLET BY MOUTH THREE TIMES DAILY AND TAKE TWO TABLET BY MOUTH AT BEDTIME (Patient taking differently: Take 20-60 mg by mouth See admin instructions. Take 20 mg in the morning and 60 mg at night.), Disp: 150 tablet, Rfl: 11   Cholecalciferol (VITAMIN D3 PO), Take 5,000 Units by mouth daily., Disp: , Rfl:    clotrimazole (LOTRIMIN) 1 % cream, Apply to both feet and between toes twice daily for 4 weeks., Disp: 60 g, Rfl: 1   furosemide (LASIX) 20 MG tablet, TAKE 1 TABLET BY MOUTH ONCE DAILY (Patient taking differently: Take 20 mg by mouth daily.), Disp: 90 tablet, Rfl: 3   glipiZIDE (GLUCOTROL) 5 MG tablet, Take 1 tablet (5 mg total) by mouth daily before lunch. (Patient taking differently: Take 5 mg by mouth in the morning and at bedtime.), Disp: 90 tablet, Rfl: 3   hydrochlorothiazide (HYDRODIURIL) 25 MG tablet, TAKE 1 TABLET BY MOUTH ONCE DAILY EVERY MORNING, Disp: 90 tablet, Rfl: 0   HYDROcodone-acetaminophen (NORCO) 7.5-325 MG tablet, Take 1 tablet by mouth every 8 (eight) hours as needed., Disp: , Rfl:    levETIRAcetam (KEPPRA) 500 MG tablet, Take 500 mg by mouth 2 (two) times daily., Disp: , Rfl:    lisinopril (ZESTRIL) 2.5 MG tablet, Take 1 tablet by mouth daily., Disp: , Rfl:    metFORMIN (GLUCOPHAGE) 1000 MG tablet, Take by mouth., Disp: , Rfl:    NON FORMULARY, Power wheelchair repairs   Diagnosis code z99.3, Disp: 1 each, Rfl: 0   NON FORMULARY, Dispense catheter urine drainage bags, Disp: 90 Bag, Rfl: 3   Ostomy Supplies (NEW IMAGE SKIN/FLANGE/TAPE) MISC, 1 application by Does not apply route daily as needed., Disp: 30 each, Rfl: 11   potassium chloride (MICRO-K) 10 MEQ CR capsule, Take 30 mEq by mouth 2 (two) times daily. , Disp: , Rfl:    atorvastatin (LIPITOR) 20 MG tablet, Take 20 mg by mouth every evening., Disp: , Rfl:   Review of Systems: + constipation, wound, numbness Denies appetite changes, fevers, chills, fatigue, unexplained weight changes. Denies hearing loss, neck lumps or masses, mouth sores, ringing in ears or voice changes. Denies cough or wheezing.  Denies shortness of breath. Denies chest pain or palpitations. Denies leg swelling. Denies abdominal distention, pain, blood in stools, diarrhea, nausea, vomiting, or early satiety. Denies pain with intercourse, dysuria, frequency, hematuria or incontinence. Denies hot flashes, pelvic pain, vaginal bleeding or vaginal discharge.   Denies joint pain, back pain or muscle pain/cramps. Denies itching, rash. Denies dizziness, headaches, or seizures. Denies swollen lymph nodes or glands, denies easy bruising or bleeding. Denies anxiety, depression, confusion, or decreased concentration.  Physical Exam: BP (!) 147/61 (BP Location: Left Wrist, Patient Position: Sitting)   Pulse 87   Temp 99.1 F (37.3 C) (Oral)   Resp 14   SpO2 100%  HEENT: Normocephalic, atraumatic, sclera anicteric. Chest: Clear to auscultation bilaterally.  No wheezes or rhonchi. Cardiovascular: Regular rate and rhythm, no murmurs. Abdomen: Obese, soft, nontender.  Normoactive bowel sounds.  No masses or hepatosplenomegaly appreciated.  Well-healed incisions. Extremities: Limited mobility of lower extremities, warm and well perfused.   Skin: Some areas of hypopigmentation on bilateral labia and upper legs.  Wounds noted along posterior  right buttocks. Lymphatics: No cervical, supraclavicular, or inguinal adenopathy. GU:  Normal appearing external genitalia without erythema, excoriation, or lesions.  Speculum exam reveals mildly atrophic vaginal mucosa with radiation changes present, no bleeding or discharge, no masses.  Sponge removed from the vagina.  Bimanual exam reveals cuff intact, no nodularity.  Rectovaginal exam confirms these findings.  Laboratory & Radiologic Studies: None new  Assessment & Plan: Constantina Laseter is a 62 y.o. woman with MS (wheelchair bound) with a history of stage IB grade 2 endometrial cancer with positive ITC's in the SLN's and LVSI. High/intermediate risk factors, s/p WPRT and vaginal brachytherapy completed May, 2020.    Patient is now nearly 4 years out from completion of adjuvant therapy and remains NED.  Sponge removed from the vagina, migrated from wound treatments to her pressure injuries.    We reviewed signs and symptoms that would be concerning for recurrence and I have encouraged her to call if she develops any of these between visits.  Otherwise, we will continue with surveillance visits alternating between myself and radiation oncology every 6 months.  I have asked her to call back at the end of 2024 to get a visit scheduled with me this time next year.  20 minutes of total time was spent for this patient encounter, including preparation, face-to-face counseling with the patient and coordination of care, and documentation of the encounter.  Jeral Pinch, MD  Division of Gynecologic Oncology  Department of Obstetrics and Gynecology  [facility]

## 2022-12-17 NOTE — Patient Instructions (Signed)
It was good to see you today.  I do not see or feel any evidence of cancer recurrence on your exam.  I will see you for follow-up in 12 months.  As always, if you develop any new and concerning symptoms before your next visit, please call to see me sooner.

## 2022-12-20 DIAGNOSIS — R2689 Other abnormalities of gait and mobility: Secondary | ICD-10-CM | POA: Insufficient documentation

## 2022-12-27 ENCOUNTER — Encounter: Payer: Self-pay | Admitting: Podiatry

## 2022-12-27 ENCOUNTER — Ambulatory Visit (INDEPENDENT_AMBULATORY_CARE_PROVIDER_SITE_OTHER): Payer: 59 | Admitting: Podiatry

## 2022-12-27 VITALS — BP 151/77

## 2022-12-27 DIAGNOSIS — M205X9 Other deformities of toe(s) (acquired), unspecified foot: Secondary | ICD-10-CM | POA: Diagnosis not present

## 2022-12-27 DIAGNOSIS — B351 Tinea unguium: Secondary | ICD-10-CM | POA: Diagnosis not present

## 2022-12-27 DIAGNOSIS — L0291 Cutaneous abscess, unspecified: Secondary | ICD-10-CM | POA: Insufficient documentation

## 2022-12-27 DIAGNOSIS — G35 Multiple sclerosis: Secondary | ICD-10-CM

## 2022-12-27 DIAGNOSIS — L84 Corns and callosities: Secondary | ICD-10-CM

## 2022-12-27 DIAGNOSIS — E0842 Diabetes mellitus due to underlying condition with diabetic polyneuropathy: Secondary | ICD-10-CM | POA: Diagnosis not present

## 2022-12-27 DIAGNOSIS — E119 Type 2 diabetes mellitus without complications: Secondary | ICD-10-CM

## 2022-12-27 NOTE — Progress Notes (Signed)
ANNUAL DIABETIC FOOT EXAM  Subjective: [name] presents today for annual diabetic foot examination.  Chief Complaint  Patient presents with   Nail Problem    DFC BS-140 A1C-6.9 PCP-Jeffery,Chelle PCP VST-12/20/2022   Patient confirms h/o diabetes.  Patient relates 17 year h/o diabetes.  Patient denies any h/o foot wounds.  Patient denies any numbness, tingling, burning, or pins/needle sensation in feet.  Patient has been diagnosed with neuropathy.  Risk factors: diabetes, diabetic neuropathy, HTN, hyperlipidemia, MS .  Alexandria Mons, PA is patient's PCP.   Past Medical History:  Diagnosis Date   Anemia    history of   Arthritis    Decubitus ulcers    currently treated at wound center   Diabetes mellitus without complication ([place])    Diabetic polyneuropathy ([place])    Endometrial cancer ([place])    Grade 2   Fibroids    GERD (gastroesophageal reflux disease)    Heartburn    Occ   History of radiation therapy 03/20/2019   02/14/2019 - 03/20/2019 Pelvis / 45 Gy in 25 fractions  Dr Gery Pray   History of radiation therapy 04/11/2019   03/27/2019, 04/04/2019, 04/11/2019 (HDR, [facility]): Vaginal Cuff (cylinder, 3 cm diameter) / 18 Gy in 3 fractions (3 cm tx length) Dr Gery Pray   Hypertension    Mixed hyperlipidemia    Morbid obesity ([place])    MS (multiple sclerosis) ([place])    Patient Active Problem List   Diagnosis Date Noted   Abscess 12/27/2022   Decreased functional mobility 12/20/2022   Pressure injury of sacral region, stage 3 ([place]) 11/08/2022   Coccydynia 09/27/2022   Opioid type dependence, continuous ([place]) 09/27/2022   Encounter for long-term use of opiate analgesic 08/23/2022   Pressure injury of skin 05/02/2022   Angioedema of lips 05/02/2022   Angioedema of intestine due to angiotensin converting enzyme inhibitor (ACE-I) 05/01/2022   Rectal pain 03/17/2022   Ganglion cyst of volar aspect of left wrist 01/01/2022   Ganglion cyst of wrist, right  01/01/2022   Bilateral wrist pain 01/01/2022   Chronic midline low back pain without sciatica 09/10/2021   Meningioma ([place]) 12/17/2020   Cerebral meningioma ([place]) 05/19/2020   Nuclear sclerotic cataract of both eyes 01/23/2020   Endometrial cancer ([place]) 11/21/2018   Morbid obesity ([place]) 11/21/2018   Pain management contract agreement 02/16/2017   Mixed hyperlipidemia 11/24/2016   Type 2 diabetes mellitus with diabetic polyneuropathy, without long-term current use of insulin ([place]) 11/24/2016   Multiple sclerosis ([place]) 11/18/2016   Leg weakness, bilateral 11/18/2016   Dysesthesia 11/18/2016   Diplegia of both lower extremities ([place]) 11/18/2016   Diabetes, polyneuropathy ([place]) 11/18/2016   Chronic indwelling Foley catheter 11/17/2016   Essential hypertension 11/17/2016   Slow transit constipation 11/17/2016   Chronically on opiate therapy 11/17/2016   Physical deconditioning 11/17/2016   Past Surgical History:  Procedure Laterality Date   BRAIN TUMOR EXCISION  09/2020   COLONOSCOPY     CYSTOSCOPY N/A 01/22/2020   Procedure: CYSTOSCOPY WITH SUPRA PUBIC TUBE CHANGE, BOTOX 200 UNITS, RETROGRADE PYELOGRAM BILATERAL. FULGERATION .5 CM-2CM;  Surgeon: Festus Aloe, MD;  Location: [facility];  Service: Urology;  Laterality: N/A;   ESOPHAGOGASTRODUODENOSCOPY N/A 12/02/2017   Procedure: ESOPHAGOGASTRODUODENOSCOPY (EGD);  Surgeon: Ronnette Juniper, MD;  Location: [facility];  Service: Gastroenterology;  Laterality: N/A;   ROBOTIC ASSISTED TOTAL HYSTERECTOMY WITH BILATERAL SALPINGO OOPHERECTOMY Bilateral 12/19/2018   Procedure: XI ROBOTIC ASSISTED TOTAL HYSTERECTOMY (UTERUS GREATER THAN 250gr) WITH BILATERAL SALPINGO OOPHORECTOMY;  Surgeon:  Everitt Amber, MD;  [facility];  Service: Gynecology;  Laterality: Bilateral;   SENTINEL NODE BIOPSY N/A 12/19/2018   Procedure: SENTINEL NODE BIOPSY;  Surgeon: Everitt Amber, MD;  [facility];  Service: Gynecology;  Laterality: N/A;   SUPRAPUBIC CATHETER  INSERTION  2009   TONSILLECTOMY     TUBAL LIGATION     Current Outpatient Medications on File Prior to Visit  Medication Sig Dispense Refill   HYDROcodone-acetaminophen (NORCO) 10-325 MG tablet Take by mouth.     ACCU-CHEK GUIDE test strip TEST 1 ONCE DAILY.     atorvastatin (LIPITOR) 10 MG tablet Take 1 tablet by mouth daily.     atorvastatin (LIPITOR) 20 MG tablet Take 20 mg by mouth every evening.     baclofen (LIORESAL) 20 MG tablet TAKE ONE TABLET BY MOUTH THREE TIMES DAILY AND TAKE TWO TABLET BY MOUTH AT BEDTIME (Patient taking differently: Take 20-60 mg by mouth See admin instructions. Take 20 mg in the morning and 60 mg at night.) 150 tablet 11   Cholecalciferol (VITAMIN D3 PO) Take 5,000 Units by mouth daily.     clotrimazole (LOTRIMIN) 1 % cream Apply to both feet and between toes twice daily for 4 weeks. 60 g 1   furosemide (LASIX) 20 MG tablet TAKE 1 TABLET BY MOUTH ONCE DAILY (Patient taking differently: Take 20 mg by mouth daily.) 90 tablet 3   glipiZIDE (GLUCOTROL) 5 MG tablet Take 1 tablet (5 mg total) by mouth daily before lunch. (Patient taking differently: Take 5 mg by mouth in the morning and at bedtime.) 90 tablet 3   hydrochlorothiazide (HYDRODIURIL) 25 MG tablet TAKE 1 TABLET BY MOUTH ONCE DAILY EVERY MORNING 90 tablet 0   levETIRAcetam (KEPPRA) 500 MG tablet Take 500 mg by mouth 2 (two) times daily.     lisinopril (ZESTRIL) 2.5 MG tablet Take 1 tablet by mouth daily.     metFORMIN (GLUCOPHAGE) 1000 MG tablet Take by mouth.     NON FORMULARY Power wheelchair repairs  Diagnosis code z99.3 1 each 0   NON FORMULARY Dispense catheter urine drainage bags 90 Bag 3   Ostomy Supplies (NEW IMAGE SKIN/FLANGE/TAPE) MISC 1 application by Does not apply route daily as needed. 30 each 11   potassium chloride (MICRO-K) 10 MEQ CR capsule Take 30 mEq by mouth 2 (two) times daily.      No current facility-administered medications on file prior to visit.    Allergies  Allergen  Reactions   Ace Inhibitors Other (See Comments)    Angioedema    Lisinopril Other (See Comments)    angioedema   Social History   Occupational History   Occupation: Disabled  Tobacco Use   Smoking status: Never   Smokeless tobacco: Never  Vaping Use   Vaping Use: Never used  Substance and Sexual Activity   Alcohol use: No   Drug use: No   Sexual activity: Not on file   Family History  Problem Relation Age of Onset   Hypertension Mother    Breast cancer Mother        spread all over   Heart disease Father    Diabetes Father    Hypertension Sister    Hypertension Brother    Colon cancer Neg Hx    Esophageal cancer Neg Hx    Rectal cancer Neg Hx    Immunization History  Administered Date(s) Administered   COVID-19, mRNA, vaccine(Comirnaty)12 years and older 09/20/2022   Pfizer Covid-19 Office manager  31yr & up 12/16/2021   Tdap 01/09/2020     Review of Systems: Negative except as noted in the HPI.   Objective: Vitals:   12/27/22 1352  BP: (!) 151/77    RSabrinna Yearwoodis a pleasant 62y.o. female in NAD. AAO X 3.  Vascular Examination: CFT <4 seconds b/l LE. Palpable pedal pulses b/l LE. Pedal hair absent. No pain with calf compression b/l. Lower extremity skin temperature gradient within normal limits. Dependent edema noted b/l LE. No varicosities noted. No ischemia or gangrene noted b/l LE. No cyanosis or clubbing noted b/l LE.  Dermatological Examination: Pedal skin is warm and supple b/l LE. No open wounds b/l LE. No interdigital macerations noted b/l LE. Toenails 1-5 bilaterally elongated, discolored, dystrophic, thickened, and crumbly with subungual debris and tenderness to dorsal palpation. Hyperkeratotic lesion(s) submet head 5 left foot.  No erythema, no edema, no drainage, no fluctuance.  Neurological Examination: Pt has subjective symptoms of neuropathy. Protective sensation intact 5/5 intact bilaterally with 10g monofilament b/l. Vibratory  sensation intact b/l.  Musculoskeletal Examination: Lower extremity muscles are noted to be flaccid bilaterally. No pain, crepitus or joint limitation noted with ROM bilateral LE. Utilizes motorized chair for mobility assistance.  Footwear Assessment: Does the patient wear appropriate shoes? Yes. Does the patient need inserts/orthotics? No.  ADA Risk Categorization: Low Risk :  Patient has all of the following: Intact protective sensation No prior foot ulcer  No severe deformity Pedal pulses present  Assessment: 1. Onychomycosis   2. Callus   3. Acquired claw toe, unspecified laterality   4. Multiple sclerosis ([place]   5. Diabetic polyneuropathy associated with diabetes mellitus due to underlying condition ([place]   6. Encounter for diabetic foot exam ([place]     Plan: No orders of the defined types were placed in this encounter.  -Patient was evaluated and treated. All patient's and/or POA's questions/concerns answered on today's visit. -Diabetic foot examination performed today. -Continue foot and shoe inspections daily. Monitor blood glucose per PCP/Endocrinologist's recommendations. -Continue supportive shoe gear daily. -Mycotic toenails 1-5 bilaterally were debrided in length and girth with sterile nail nippers and dremel without incident. -Callus(es) submet head 5 left foot pared utilizing rotary bur without complication or incident. Total number pared =1. -Patient/POA to call should there be question/concern in the interim. Return in about 3 months (around 03/28/2023).  JMarzetta Board DPM

## 2023-01-05 ENCOUNTER — Ambulatory Visit: Payer: Medicare Other | Admitting: Family Medicine

## 2023-01-05 NOTE — Patient Instructions (Incomplete)

## 2023-01-05 NOTE — Progress Notes (Deleted)
No chief complaint on file.    HISTORY OF PRESENT ILLNESS: Today 01/05/23  [name] is a 62 y.o. female here today for follow up for for relapsing/active SPMS. She discontinued Betaseron 12/2020. She is followed regularly by [facility] post meningioma resection and receives regular MRIs. Last MRI stable 11/2021.   She reports that she is doing fairly well.  No new or exacerbating symptoms. She does have some generalized aches and pains that are worse at night.  She feels it is hard for her to get comfortable at night.  She relates this to needing a new mattress.  She does use baclofen regularly and feels that this helps with spasticity. Dose was increased to '20mg'$  five times daily.  She is wheelchair-bound.  She continues to use [place] lift as needed.    No changes with bowel or bladder functioning.  She uses urinary catheter as needed.    She continues vitamin D supplementation over-the-counter, 5000 IUs daily.   HISTORY (copied from Dr Garth Bigness previous note)  [name] is a 62 y.o. woman with relapsing / active SPMS diagnosed in 2006.   Update 12/19/2019: She feels she is doing about the same as last year.     She stopped Betaseron January 2022.   She cannot wal and uses a wheelchair.  She feels strength is the same.   She needs a [employer] fast streamer to transfer.    Her daughters live with her and help her.       She has muscle spasticity in her legs., left = right, the same as last year   She has umbness in her feet and right leg.    She denies numbness in her hands.     She has a suprapubic catheter.  She has had some UTs    She feels vision is the same with sequela of left optic neuritis.      She notes some fatigue that is stable.  She is sleeping   Her head and feet are elevated at night in bed.    She denies depression or anxiety.    She denies cognitive issues.     She had a complete hysterectomy due to endometrial cancer and has done well.   She did well during recovery.     She had a  meningioma resection by Dr. Jalene Mullet at [facility] and has done well.   A follow-up MRI 11/17/2021 showed no recurrence.     MS History:   In 2006, she presented with tingling in her hands and right greater than left leg weakness.    She saw Dr. Luberta Robertson in [facility]. She was placed on Copaxone. Unfortunately,  she had a major exacerbation later that year and lost most of the use of her legs.  She was then placed on Betaseron and remains on Betaseron. She does not think she has had any exacerbations while she is on it. She tolerates it well and does not have any skin reactions.    Since 2007, she has been predominantly wheelchair bound strength has progressively worsened and she went from using a walker for short distances to being completely wheelchair bound a few years ago.   Her last MRI was greater than 10 years ago.    REVIEW OF SYSTEMS: Out of a complete 14 system review of symptoms, the patient complains only of the following symptoms, spasticity, neurogenic bladder, lower extremity weakness, fatigue, chronic pain and all other reviewed systems are negative.   ALLERGIES:  Allergies  Allergen Reactions   Ace Inhibitors Other (See Comments)    Angioedema    Lisinopril Other (See Comments)    angioedema     HOME MEDICATIONS: Outpatient Medications Prior to Visit  Medication Sig Dispense Refill   ACCU-CHEK GUIDE test strip TEST 1 ONCE DAILY.     atorvastatin (LIPITOR) 10 MG tablet Take 1 tablet by mouth daily.     atorvastatin (LIPITOR) 20 MG tablet Take 20 mg by mouth every evening.     baclofen (LIORESAL) 20 MG tablet TAKE ONE TABLET BY MOUTH THREE TIMES DAILY AND TAKE TWO TABLET BY MOUTH AT BEDTIME (Patient taking differently: Take 20-60 mg by mouth See admin instructions. Take 20 mg in the morning and 60 mg at night.) 150 tablet 11   Cholecalciferol (VITAMIN D3 PO) Take 5,000 Units by mouth daily.     clotrimazole (LOTRIMIN) 1 % cream Apply to both feet and between toes twice daily for 4  weeks. 60 g 1   furosemide (LASIX) 20 MG tablet TAKE 1 TABLET BY MOUTH ONCE DAILY (Patient taking differently: Take 20 mg by mouth daily.) 90 tablet 3   glipiZIDE (GLUCOTROL) 5 MG tablet Take 1 tablet (5 mg total) by mouth daily before lunch. (Patient taking differently: Take 5 mg by mouth in the morning and at bedtime.) 90 tablet 3   hydrochlorothiazide (HYDRODIURIL) 25 MG tablet TAKE 1 TABLET BY MOUTH ONCE DAILY EVERY MORNING 90 tablet 0   HYDROcodone-acetaminophen (NORCO) 10-325 MG tablet Take by mouth.     levETIRAcetam (KEPPRA) 500 MG tablet Take 500 mg by mouth 2 (two) times daily.     lisinopril (ZESTRIL) 2.5 MG tablet Take 1 tablet by mouth daily.     metFORMIN (GLUCOPHAGE) 1000 MG tablet Take by mouth.     NON FORMULARY Power wheelchair repairs  Diagnosis code z99.3 1 each 0   NON FORMULARY Dispense catheter urine drainage bags 90 Bag 3   Ostomy Supplies (NEW IMAGE SKIN/FLANGE/TAPE) MISC 1 application by Does not apply route daily as needed. 30 each 11   potassium chloride (MICRO-K) 10 MEQ CR capsule Take 30 mEq by mouth 2 (two) times daily.      No facility-administered medications prior to visit.     PAST MEDICAL HISTORY: Past Medical History:  Diagnosis Date   Anemia    history of   Arthritis    Decubitus ulcers    currently treated at wound center   Diabetes mellitus without complication ([place])    Diabetic polyneuropathy ([place])    Endometrial cancer ([place])    Grade 2   Fibroids    GERD (gastroesophageal reflux disease)    Heartburn    Occ   History of radiation therapy 03/20/2019   02/14/2019 - 03/20/2019 Pelvis / 45 Gy in 25 fractions  Dr Gery Pray   History of radiation therapy 04/11/2019   03/27/2019, 04/04/2019, 04/11/2019 (HDR, [facility]): Vaginal Cuff (cylinder, 3 cm diameter) / 18 Gy in 3 fractions (3 cm tx length) Dr Gery Pray   Hypertension    Mixed hyperlipidemia    Morbid obesity ([place])    MS (multiple sclerosis) ([place])      PAST SURGICAL  HISTORY: Past Surgical History:  Procedure Laterality Date   BRAIN TUMOR EXCISION  09/2020   COLONOSCOPY     CYSTOSCOPY N/A 01/22/2020   Procedure: CYSTOSCOPY WITH SUPRA PUBIC TUBE CHANGE, BOTOX 200 UNITS, RETROGRADE PYELOGRAM BILATERAL. FULGERATION .5 CM-2CM;  Surgeon: Festus Aloe, MD;  [facility];  Service: Urology;  Laterality: N/A;   ESOPHAGOGASTRODUODENOSCOPY N/A 12/02/2017   Procedure: ESOPHAGOGASTRODUODENOSCOPY (EGD);  Surgeon: Ronnette Juniper, MD;  Location: [facility];  Service: Gastroenterology;  Laterality: N/A;   ROBOTIC ASSISTED TOTAL HYSTERECTOMY WITH BILATERAL SALPINGO OOPHERECTOMY Bilateral 12/19/2018   Procedure: XI ROBOTIC ASSISTED TOTAL HYSTERECTOMY (UTERUS GREATER THAN 250gr) WITH BILATERAL SALPINGO OOPHORECTOMY;  Surgeon: Everitt Amber, MD;  [facility];  Service: Gynecology;  Laterality: Bilateral;   SENTINEL NODE BIOPSY N/A 12/19/2018   Procedure: SENTINEL NODE BIOPSY;  Surgeon: Everitt Amber, MD;  [facility];  Service: Gynecology;  Laterality: N/A;   SUPRAPUBIC CATHETER INSERTION  2009   TONSILLECTOMY     TUBAL LIGATION       FAMILY HISTORY: Family History  Problem Relation Age of Onset   Hypertension Mother    Breast cancer Mother        spread all over   Heart disease Father    Diabetes Father    Hypertension Sister    Hypertension Brother    Colon cancer Neg Hx    Esophageal cancer Neg Hx    Rectal cancer Neg Hx      SOCIAL HISTORY: Social History   Socioeconomic History   Marital status: Single    Spouse name: Not on file   Number of children: 3   Years of education: Not on file   Highest education level: Not on file  Occupational History   Occupation: Disabled  Tobacco Use   Smoking status: Never   Smokeless tobacco: Never  Vaping Use   Vaping Use: Never used  Substance and Sexual Activity   Alcohol use: No   Drug use: No   Sexual activity: Not on file  Other Topics Concern   Not on file  Social History Narrative    Not on file   Social Determinants of Health   Financial Resource Strain: Not on file  Food Insecurity: Not on file  Transportation Needs: Not on file  Physical Activity: Not on file  Stress: Not on file  Social Connections: Not on file  Intimate Partner Violence: Not on file      PHYSICAL EXAM  There were no vitals filed for this visit.  There is no height or weight on file to calculate BMI.   Generalized: Well developed, in no acute distress  Cardiology: normal rate and rhythm, no murmur auscultated  Respiratory: clear to auscultation bilaterally    Neurological examination  Mentation: Alert oriented to time, place, history taking. Follows all commands speech and language fluent Cranial nerve II-XII: Pupils were equal round reactive to light. Extraocular movements were full, visual field were full on confrontational test. Facial sensation and strength were normal. Head turning and shoulder shrug  were normal and symmetric. Motor: The motor testing reveals 5 over 5 strength of bilateral upper extremities. 0/5 bilateral lower extremities.  Sensory: Sensory testing is intact to soft touch on all 4 extremities. No evidence of extinction is noted.  Coordination: Cerebellar testing reveals good finger-nose-finger bilaterally and unable to perform heel-to-shin bilaterally.  Gait and station: Patient is in motorized wheelchair today. Reflexes: Deep tendon reflexes are symmetric and normal bilaterally.     DIAGNOSTIC DATA (LABS, IMAGING, TESTING) - I reviewed patient records, labs, notes, testing and imaging myself where available.  Lab Results  Component Value Date   WBC 10.7 (H) 05/02/2022   HGB 8.8 (L) 05/02/2022   HCT 29.6 (L) 05/02/2022   MCV 86.0 05/02/2022   PLT 452 (H) 05/02/2022  Component Value Date/Time   NA 137 05/02/2022 0316   NA 141 12/24/2020 1512   K 4.5 05/02/2022 0316   CL 105 05/02/2022 0316   CO2 23 05/02/2022 0316   GLUCOSE 184 (H) 05/02/2022  0316   BUN 25 (H) 05/02/2022 0316   BUN 12 12/24/2020 1512   CREATININE 0.96 05/02/2022 0316   CALCIUM 9.2 05/02/2022 0316   PROT 7.2 12/24/2020 1512   ALBUMIN 4.6 12/24/2020 1512   AST 8 12/24/2020 1512   ALT 12 12/24/2020 1512   ALKPHOS 94 12/24/2020 1512   BILITOT <0.2 12/24/2020 1512   GFRNONAA >60 05/02/2022 0316   GFRAA 68 12/24/2020 1512   Lab Results  Component Value Date   CHOL 151 06/27/2019   HDL 62 06/27/2019   LDLCALC 72 06/27/2019   TRIG 83 06/27/2019   CHOLHDL 2.4 06/27/2019   Lab Results  Component Value Date   HGBA1C 6.9 (H) 05/01/2022   No results found for: "VITAMINB12" Lab Results  Component Value Date   TSH 1.140 12/19/2019        No data to display           ASSESSMENT AND PLAN  62 y.o. year old female  has a past medical history of Anemia, Arthritis, Decubitus ulcers, Diabetes mellitus without complication ([place]), Diabetic polyneuropathy ([place]), Endometrial cancer ([place]), Fibroids, GERD (gastroesophageal reflux disease), Heartburn, History of radiation therapy (03/20/2019), History of radiation therapy (04/11/2019), Hypertension, Mixed hyperlipidemia, Morbid obesity ([place]), and MS (multiple sclerosis) ([place]). here with   No diagnosis found.  Tykera is doing fairly well, today.  I am very happy to hear that she recovered nicely following tumor resection with [facility] in October.  Dysesthesias and right upper extremity weakness have resolved.  We will continue Betaseron.  I will update labs today.  She will continue baclofen as prescribed.  She will continue close follow-up with primary care and other care team members as directed.  Healthy lifestyle habits encouraged.  She will follow-up with Dr. Felecia Shelling in 1 year, sooner if needed.  She verbalizes understanding and agreement with this plan.  No orders of the defined types were placed in this encounter.   Debbora Presto, MSN, FNP-C 01/05/2023, 10:28 AM  [facility] [address], [place], [place] [address] [phone]

## 2023-02-04 HISTORY — PX: IRRIGATION AND DEBRIDEMENT BUTTOCKS: SHX6601

## 2023-02-07 DIAGNOSIS — D649 Anemia, unspecified: Secondary | ICD-10-CM | POA: Insufficient documentation

## 2023-02-07 DIAGNOSIS — Z8669 Personal history of other diseases of the nervous system and sense organs: Secondary | ICD-10-CM | POA: Insufficient documentation

## 2023-02-07 DIAGNOSIS — D75839 Thrombocytosis, unspecified: Secondary | ICD-10-CM | POA: Diagnosis present

## 2023-02-07 DIAGNOSIS — E785 Hyperlipidemia, unspecified: Secondary | ICD-10-CM | POA: Diagnosis present

## 2023-02-07 DIAGNOSIS — S31000D Unspecified open wound of lower back and pelvis without penetration into retroperitoneum, subsequent encounter: Secondary | ICD-10-CM | POA: Insufficient documentation

## 2023-02-11 ENCOUNTER — Other Ambulatory Visit: Payer: Self-pay

## 2023-02-11 ENCOUNTER — Emergency Department (HOSPITAL_COMMUNITY)
Admission: EM | Admit: 2023-02-11 | Discharge: 2023-02-11 | Disposition: A | Discharge status: Home / Self Care | Payer: 59 | Attending: Emergency Medicine | Admitting: Emergency Medicine

## 2023-02-11 DIAGNOSIS — T148XXA Other injury of unspecified body region, initial encounter: Secondary | ICD-10-CM

## 2023-02-11 DIAGNOSIS — L7622 Postprocedural hemorrhage and hematoma of skin and subcutaneous tissue following other procedure: Secondary | ICD-10-CM | POA: Insufficient documentation

## 2023-02-11 LAB — CBC
HCT: 24.4 % — ABNORMAL LOW (ref 36.0–46.0)
Hemoglobin: 7 g/dL — ABNORMAL LOW (ref 12.0–15.0)
MCH: 23 pg — ABNORMAL LOW (ref 26.0–34.0)
MCHC: 28.7 g/dL — ABNORMAL LOW (ref 30.0–36.0)
MCV: 80 fL (ref 80.0–100.0)
Platelets: 653 10*3/uL — ABNORMAL HIGH (ref 150–400)
RBC: 3.05 MIL/uL — ABNORMAL LOW (ref 3.87–5.11)
RDW: 16.7 % — ABNORMAL HIGH (ref 11.5–15.5)
WBC: 9.6 10*3/uL (ref 4.0–10.5)
nRBC: 0 % (ref 0.0–0.2)

## 2023-02-11 MED ORDER — LIDOCAINE-EPINEPHRINE (PF) 2 %-1:200000 IJ SOLN
INTRAMUSCULAR | Status: AC
  Filled 2023-02-11: qty 20

## 2023-02-11 NOTE — ED Triage Notes (Signed)
BIBA from home c/o chronic R sided buttock wound bleeding. Pt has has wound since 2007 with wound care through [facility] in [place]. Pt had wound debridement on Tuesday. Wound dressing changed around 2000 last night and daughter noted that wound was bleeding significantly so pt came in for evaluation. Pt has hx of MS and is wheelchair bound. Denies fevers/chills. Pt A&Ox4, VSS upon arrival.

## 2023-02-11 NOTE — Discharge Instructions (Signed)
Follow up with your surgeon.  Follow up with your family doc.

## 2023-02-11 NOTE — ED Provider Notes (Signed)
[place] [facility] AT [facility] Provider Note   CSN: [id number] Arrival date & time: 02/11/23  [id number]     History  Chief Complaint  Patient presents with   Wound Check    [name] is a 62 y.o. female.  62 yo F with a cc of a sacral ulcer bleeding.  Patient recently had this debrided in another hospital.  Changed dressing this evening and noted significant bleeding.  No fevers.    Wound Check       Home Medications Prior to Admission medications   Medication Sig Start Date End Date Taking? Authorizing Provider  ACCU-CHEK GUIDE test strip TEST 1 ONCE DAILY. 02/03/22   [provider]  atorvastatin (LIPITOR) 10 MG tablet Take 1 tablet by mouth daily. 02/03/22   [provider]  atorvastatin (LIPITOR) 20 MG tablet Take 20 mg by mouth every evening. 04/06/22   [provider]  baclofen (LIORESAL) 20 MG tablet TAKE ONE TABLET BY MOUTH THREE TIMES DAILY AND TAKE TWO TABLET BY MOUTH AT BEDTIME Patient taking differently: Take 20-60 mg by mouth See admin instructions. Take 20 mg in the morning and 60 mg at night. 03/22/22   Lomax, Amy, NP  Cholecalciferol (VITAMIN D3 PO) Take 5,000 Units by mouth daily.    [provider]  clotrimazole (LOTRIMIN) 1 % cream Apply to both feet and between toes twice daily for 4 weeks. 08/28/20   Marzetta Board, DPM  furosemide (LASIX) 20 MG tablet TAKE 1 TABLET BY MOUTH ONCE DAILY Patient taking differently: Take 20 mg by mouth daily. 10/20/19   Forrest Moron, MD  glipiZIDE (GLUCOTROL) 5 MG tablet Take 1 tablet (5 mg total) by mouth daily before lunch. Patient taking differently: Take 5 mg by mouth in the morning and at bedtime. 05/11/17   Forrest Moron, MD  hydrochlorothiazide (HYDRODIURIL) 25 MG tablet TAKE 1 TABLET BY MOUTH ONCE DAILY EVERY MORNING 04/21/20   Forrest Moron, MD  HYDROcodone-acetaminophen (NORCO) 10-325 MG tablet Take by mouth. 11/08/22 03/16/23  [provider]   levETIRAcetam (KEPPRA) 500 MG tablet Take 500 mg by mouth 2 (two) times daily. 02/14/22   [provider]  lisinopril (ZESTRIL) 2.5 MG tablet Take 1 tablet by mouth daily. 02/03/22   [provider]  metFORMIN (GLUCOPHAGE) 1000 MG tablet Take by mouth. 02/03/22   [provider]  NON FORMULARY Power wheelchair repairs  Diagnosis code z99.3 04/13/17   Forrest Moron, MD  NON FORMULARY Dispense catheter urine drainage bags 07/19/18   Forrest Moron, MD  Ostomy Supplies (NEW IMAGE SKIN/FLANGE/TAPE) MISC 1 application by Does not apply route daily as needed. 07/19/18   Forrest Moron, MD  potassium chloride (MICRO-K) 10 MEQ CR capsule Take 30 mEq by mouth 2 (two) times daily.  01/07/20   [provider]      Allergies    Ace inhibitors and Lisinopril    Review of Systems   Review of Systems  Physical Exam Updated Vital Signs BP (!) 168/82   Pulse 85   Temp 98.2 F (36.8 C)   Resp 18   Ht '5\' 7"'$  (1.702 m)   Wt 129.7 kg   SpO2 100%   BMI 44.79 kg/m  Physical Exam Vitals and nursing note reviewed.  Constitutional:      General: She is not in acute distress.    Appearance: She is well-developed. She is not diaphoretic.  HENT:     Head:  Normocephalic and atraumatic.  Eyes:     Pupils: Pupils are equal, round, and reactive to light.  Cardiovascular:     Rate and Rhythm: Normal rate and regular rhythm.     Heart sounds: No murmur heard.    No friction rub. No gallop.  Pulmonary:     Effort: Pulmonary effort is normal.     Breath sounds: No wheezing or rales.  Abdominal:     General: There is no distension.     Palpations: Abdomen is soft.     Tenderness: There is no abdominal tenderness.  Genitourinary:    Comments: Wound to the right buttock, c/d.  Area of pulsatile bleeding to the inferior aspect of the wound.  Musculoskeletal:        General: No tenderness.     Cervical back: Normal range of motion and neck supple.  Skin:    General: Skin  is warm and dry.  Neurological:     Mental Status: She is alert and oriented to person, place, and time.  Psychiatric:        Behavior: Behavior normal.     ED Results / Procedures / Treatments   Labs (all labs ordered are listed, but only abnormal results are displayed) Labs Reviewed  CBC - Abnormal; Notable for the following components:      Result Value   RBC 3.05 (*)    Hemoglobin 7.0 (*)    HCT 24.4 (*)    MCH 23.0 (*)    MCHC 28.7 (*)    RDW 16.7 (*)    Platelets 653 (*)    All other components within normal limits    EKG None  Radiology No results found.  Procedures Wound repair  Date/Time: 02/11/2023 3:06 AM  Performed by: Deno Etienne, DO Authorized by: Deno Etienne, DO  Consent: Verbal consent obtained. Consent given by: patient Patient understanding: patient does not state understanding of the procedure being performed Required items: required blood products, implants, devices, and special equipment available Patient identity confirmed: verbally with patient Time out: Immediately prior to procedure a "time out" was called to verify the correct patient, procedure, equipment, support staff and site/side marked as required. Preparation: Patient was prepped and draped in the usual sterile fashion. Local anesthesia used: yes Anesthesia: local infiltration  Anesthesia: Local anesthesia used: yes Local Anesthetic: lidocaine 2% with epinephrine Anesthetic total: 5 mL  Sedation: Patient sedated: no  Patient tolerance: patient tolerated the procedure well with no immediate complications Comments: Pulsatile bleeder, figure of 8 stitch 3-0 Nylon.        Medications Ordered in ED Medications  lidocaine-EPINEPHrine (XYLOCAINE W/EPI) 2 %-1:200000 (PF) injection (  Given 02/11/23 0251)    ED Course/ Medical Decision Making/ A&P                             Medical Decision Making Amount and/or Complexity of Data Reviewed Labs: ordered.   62 yo F with a cc  of bleeding from her sacral wound.  Just debrided 48 hours ago.  Bleeding controlled at bedside.  No signs of infection. Cbc without significant change to hgb when compared to labs from outside hospital.  3:58 AM:  I have discussed the diagnosis/risks/treatment options with the patient.  Evaluation and diagnostic testing in the emergency department does not suggest an emergent condition requiring admission or immediate intervention beyond what has been performed at this time.  They will follow up with PCP.  We also discussed returning to the ED immediately if new or worsening sx occur. We discussed the sx which are most concerning (e.g., sudden worsening pain, fever, inability to tolerate by mouth) that necessitate immediate return. Medications administered to the patient during their visit and any new prescriptions provided to the patient are listed below.  Medications given during this visit Medications  lidocaine-EPINEPHrine (XYLOCAINE W/EPI) 2 %-1:200000 (PF) injection (  Given 02/11/23 0251)     The patient appears reasonably screen and/or stabilized for discharge and I doubt any other medical condition or other [facility] requiring further screening, evaluation, or treatment in the ED at this time prior to discharge.            Final Clinical Impression(s) / ED Diagnoses Final diagnoses:  Bleeding from wound    Rx / DC Orders ED Discharge Orders     None         Deno Etienne, DO 02/11/23 [phone]

## 2023-02-11 NOTE — ED Notes (Signed)
Discharge instructions reviewed with patient and daughter. Advised to follow up with surgeon and wound care for further wound management and suture removal and return to ED if wound has significant bleeding again. Both state understanding. Pt transferred to [place] upon discharge. No s/s of acute distress upon leaving.

## 2023-03-09 ENCOUNTER — Encounter: Payer: Self-pay | Admitting: Neurology

## 2023-03-09 ENCOUNTER — Ambulatory Visit (INDEPENDENT_AMBULATORY_CARE_PROVIDER_SITE_OTHER): Payer: 59 | Admitting: Neurology

## 2023-03-09 VITALS — BP 110/53 | HR 101 | Ht 67.0 in | Wt 286.0 lb

## 2023-03-09 DIAGNOSIS — D329 Benign neoplasm of meninges, unspecified: Secondary | ICD-10-CM | POA: Diagnosis not present

## 2023-03-09 DIAGNOSIS — G35 Multiple sclerosis: Secondary | ICD-10-CM

## 2023-03-09 DIAGNOSIS — L89159 Pressure ulcer of sacral region, unspecified stage: Secondary | ICD-10-CM

## 2023-03-09 DIAGNOSIS — G8220 Paraplegia, unspecified: Secondary | ICD-10-CM | POA: Diagnosis not present

## 2023-03-09 NOTE — Progress Notes (Signed)
[facility]  PATIENT: [name] DOB: Jul 17, 1961  REFERRING DOCTOR OR PCP:  Delia Chimes SOURCE: patient, records from Dr. Nolon Rod  _________________________________   HISTORICAL  CHIEF COMPLAINT:  Chief Complaint  Patient presents with   Follow-up    RM 11, alone. Last seen 12/30/21. Off MS DMT.  No MS concerns. Has eye appt next week via [facility]. In electric wheelchair in office today.    HISTORY OF PRESENT ILLNESS:  [name] is a 62 y.o. womanoman with relapsing / active SPMS diagnosed in 2006.  Update 03/09/2023: She feels she is doing about the same as last year.     She stopped Betaseron January 2022.   She cannot wal and uses a wheelchair.  She feels strength is the same.   She needs a [employer] fast streamer to transfer.    Her daughters live with her and help her.      She has needed a wheelchair since 2008.  She has muscle spasticity in her legs., left = right, the same as last year  She has spasticity in her legs.   She is on baclofen with some benefit though hiher dose did not help further.   She has umbness in her feet and right leg > arm.    She denies numbness in her hands.     She has a suprapubic catheter.  She has had some UTs    She feels vision is the same with sequela of left optic neuritis.     She needs a [employer] fast streamer to transfer.     She notes some fatigue that is stable.  She is sleeping well most nights.  She denies depression or anxiety.    She denies cognitive issues.    She had a meningioma resection by Dr. Jalene Mullet in 2022 at [facility] and has done well.   A follow-up MRI 11/17/2021 showed no recurrence.    She had a complete hysterectomy in the 1980's due to endometrial cancer and has done well.   She did well during recovery.    She is currently seeing wound care due to sacral decubitus.  She reports that is doing about the same now as it did a few weeks ago.  She has not needed systemic antibiotics.    MS History:   In 2006, she  presented with tingling in her hands and right greater than left leg weakness.    She saw Dr. Luberta Robertson in [facility]. She was placed on Copaxone. Unfortunately,  she had a major exacerbation later that year and lost most of the use of her legs.  She was then placed on Betaseron and remains on Betaseron. She does not think she has had any exacerbations while she is on it. She tolerates it well and does not have any skin reactions.    Since 2007, after a relapse, she has been predominantly wheelchair bound strength has progressively worsened and she went from using a walker for short distances to being completely wheelchair bound a few years ago.    MRi brain 05/01/2020 showed Left parietal convexity meningioma at the vertex. Mass effect on the underlying left frontoparietal lobes without parenchymal edema.  Mild burden of supratentorial and infratentorial white matter lesions compatible with history of multiple sclerosis.  MR[facility] cervical spine in 05/01/2020 showed  Limited examination due to body habitus and poor signal to noise ratio.    No obvious cord lesions are identified. Small central disc protrusion at C3-4.  Broad-based disc protrusion at C4-5 with flattening of the ventral thecal sac and near obliteration of the ventral CSF space.  Disc osteophyte complexes bilaterally with right greater than left foraminal stenosis. Diffuse bulging annulus, osteophytic ridging and uncinate spurring at C5-6 with flattening of the ventral thecal sac and narrowing of the ventral CSF space. Mild foraminal encroachment bilaterally.  Diffuse bulging annulus and shallow central disc protrusion at C6-7.  REVIEW OF SYSTEMS: Constitutional: No fevers, chills, sweats, or change in appetite Eyes: Reduced left vision.  No eye pain Ear, nose and throat: No hearing loss, ear pain, nasal congestion, sore throat Cardiovascular: No chest pain, palpitations Respiratory:  No shortness of breath at rest or with exertion.   No  wheezes GastrointestinaI: No nausea, vomiting, diarrhea, abdominal pain, fecal incontinence Genitourinary:  She has a suprapubic catheter.. Musculoskeletal:  No neck pain, back pain Integumentary: No rash, pruritus, skin lesions Neurological: as above Psychiatric: Denies depression at this time.  No anxiety Endocrine: has NIDDM Hematologic/Lymphatic:  No anemia, purpura, petechiae. Allergic/Immunologic: No itchy/runny eyes, nasal congestion, recent allergic reactions, rashes  ALLERGIES: Allergies  Allergen Reactions   Ace Inhibitors Other (See Comments)    Angioedema    Lisinopril Other (See Comments)    angioedema    HOME MEDICATIONS:  Current Outpatient Medications:    ACCU-CHEK GUIDE test strip, TEST 1 ONCE DAILY., Disp: , Rfl:    atorvastatin (LIPITOR) 10 MG tablet, Take 1 tablet by mouth daily., Disp: , Rfl:    atorvastatin (LIPITOR) 20 MG tablet, Take 20 mg by mouth every evening., Disp: , Rfl:    baclofen (LIORESAL) 20 MG tablet, TAKE ONE TABLET BY MOUTH THREE TIMES DAILY AND TAKE TWO TABLET BY MOUTH AT BEDTIME (Patient taking differently: Take 20-60 mg by mouth See admin instructions. Take 20 mg in the morning and 60 mg at night.), Disp: 150 tablet, Rfl: 11   Cholecalciferol (VITAMIN D3 PO), Take 5,000 Units by mouth daily., Disp: , Rfl:    clotrimazole (LOTRIMIN) 1 % cream, Apply to both feet and between toes twice daily for 4 weeks., Disp: 60 g, Rfl: 1   furosemide (LASIX) 20 MG tablet, TAKE 1 TABLET BY MOUTH ONCE DAILY (Patient taking differently: Take 20 mg by mouth daily.), Disp: 90 tablet, Rfl: 3   glipiZIDE (GLUCOTROL) 5 MG tablet, Take 1 tablet (5 mg total) by mouth daily before lunch. (Patient taking differently: Take 5 mg by mouth in the morning and at bedtime.), Disp: 90 tablet, Rfl: 3   hydrochlorothiazide (HYDRODIURIL) 25 MG tablet, TAKE 1 TABLET BY MOUTH ONCE DAILY EVERY MORNING, Disp: 90 tablet, Rfl: 0   HYDROcodone-acetaminophen (NORCO) 10-325 MG tablet, Take  by mouth., Disp: , Rfl:    levETIRAcetam (KEPPRA) 500 MG tablet, Take 500 mg by mouth 2 (two) times daily., Disp: , Rfl:    lisinopril (ZESTRIL) 2.5 MG tablet, Take 1 tablet by mouth daily., Disp: , Rfl:    metFORMIN (GLUCOPHAGE) 1000 MG tablet, Take by mouth., Disp: , Rfl:    NON FORMULARY, Power wheelchair repairs  Diagnosis code z99.3, Disp: 1 each, Rfl: 0   NON FORMULARY, Dispense catheter urine drainage bags, Disp: 90 Bag, Rfl: 3   Ostomy Supplies (NEW IMAGE SKIN/FLANGE/TAPE) MISC, 1 application by Does not apply route daily as needed., Disp: 30 each, Rfl: 11   potassium chloride (MICRO-K) 10 MEQ CR capsule, Take 30 mEq by mouth 2 (two) times daily. , Disp: , Rfl:   PAST MEDICAL HISTORY: Past Medical History:  Diagnosis  Date   Anemia    history of   Arthritis    Decubitus ulcers    currently treated at wound center   Diabetes mellitus without complication    Diabetic polyneuropathy    Endometrial cancer    Grade 2   Fibroids    GERD (gastroesophageal reflux disease)    Heartburn    Occ   History of radiation therapy 03/20/2019   02/14/2019 - 03/20/2019 Pelvis / 45 Gy in 25 fractions  Dr Gery Pray   History of radiation therapy 04/11/2019   03/27/2019, 04/04/2019, 04/11/2019 (HDR, [facility]): Vaginal Cuff (cylinder, 3 cm diameter) / 18 Gy in 3 fractions (3 cm tx length) Dr Gery Pray   Hypertension    Mixed hyperlipidemia    Morbid obesity    MS (multiple sclerosis)     PAST SURGICAL HISTORY: Past Surgical History:  Procedure Laterality Date   BRAIN TUMOR EXCISION  09/2020   COLONOSCOPY     CYSTOSCOPY N/A 01/22/2020   Procedure: CYSTOSCOPY WITH SUPRA PUBIC TUBE CHANGE, BOTOX 200 UNITS, RETROGRADE PYELOGRAM BILATERAL. FULGERATION .5 CM-2CM;  Surgeon: Festus Aloe, MD;  Location: [facility];  Service: Urology;  Laterality: N/A;   ESOPHAGOGASTRODUODENOSCOPY N/A 12/02/2017   Procedure: ESOPHAGOGASTRODUODENOSCOPY (EGD);  Surgeon: Ronnette Juniper, MD;  Location: [facility];   Service: Gastroenterology;  Laterality: N/A;   IRRIGATION AND DEBRIDEMENT BUTTOCKS Right 02/2023   ROBOTIC ASSISTED TOTAL HYSTERECTOMY WITH BILATERAL SALPINGO OOPHERECTOMY Bilateral 12/19/2018   Procedure: XI ROBOTIC ASSISTED TOTAL HYSTERECTOMY (UTERUS GREATER THAN 250gr) WITH BILATERAL SALPINGO OOPHORECTOMY;  Surgeon: Everitt Amber, MD;  Location: [facility];  Service: Gynecology;  Laterality: Bilateral;   SENTINEL NODE BIOPSY N/A 12/19/2018   Procedure: SENTINEL NODE BIOPSY;  Surgeon: Everitt Amber, MD;  Location: [facility];  Service: Gynecology;  Laterality: N/A;   SUPRAPUBIC CATHETER INSERTION  2009   TONSILLECTOMY     TUBAL LIGATION      FAMILY HISTORY: Family History  Problem Relation Age of Onset   Hypertension Mother    Breast cancer Mother        spread all over   Heart disease Father    Diabetes Father    Hypertension Sister    Hypertension Brother    Colon cancer Neg Hx    Esophageal cancer Neg Hx    Rectal cancer Neg Hx     SOCIAL HISTORY:  Social History   Socioeconomic History   Marital status: Single    Spouse name: Not on file   Number of children: 3   Years of education: 14   Highest education level: Not on file  Occupational History   Occupation: Disabled  Tobacco Use   Smoking status: Never   Smokeless tobacco: Never  Vaping Use   Vaping Use: Never used  Substance and Sexual Activity   Alcohol use: No   Drug use: No   Sexual activity: Not on file  Other Topics Concern   Not on file  Social History Narrative   Right handed   Soda- 1 can per day   Social Determinants of Health   Financial Resource Strain: Not on file  Food Insecurity: Not on file  Transportation Needs: Not on file  Physical Activity: Not on file  Stress: Not on file  Social Connections: Not on file  Intimate Partner Violence: Not on file     PHYSICAL EXAM  Vitals:   03/09/23 1337  BP: (!) 110/53  Pulse: (!) 101  Weight: 286 lb (129.7 kg)  Height: 5'  7" (1.702 m)    Body  mass index is 44.79 kg/m.   General: The patient is well-developed and well-nourished and in no acute distress in a wheelchair  Neck:    The neck is nontender with a good range of motion.   Skin/Ext.: Arms/legs without rash.   She has mild pedal edema.  Neurologic Exam  Mental status: The patient is alert and oriented x 3 at the time of the examination. The patient has apparent normal recent and remote memory, with an apparently normal attention span and concentration ability.   Speech is normal.  Cranial nerves: Extraocular movements are full.  Color vision was symmetric but acuity was reduced in the left eye.  Facial strength was normal.  Facial sensation was normal.  Trapezius strength was normal.  Hearing was normal and symmetric.  Motor:  Muscle bulk is normal.   Tone is increased in both legs, slightly more on the right than the left. She has normal strength in both arms. Strength is 0/5 in right leg and 1/5 left iliopsoas and 2-/5 elsewhere in left leg   Sensory: She reported symmetric touch and vibration sensation in the arms and legs.  Coordination: Cerebellar testing reveals good finger-nose-finger .  RAM in hands is normal.   She is unable to do heel-to-shin  Gait and station: She is wheelchair bound and cannot stand   Reflexes: Deep tendon reflexes are normal in the arms. She has increased reflexes at the knees with spread. She has reduced reflexes at the ankles.  There is no ankle clonus..       ASSESSMENT AND PLAN  Multiple sclerosis  Meningioma  Diplegia of both lower extremities  Pressure injury of skin of sacral region, unspecified injury stage    1.  She has secondary progressive MS.   She is off Betaseron we discussed M[employer] as an option that may have long term benefit.  Because she has an active infection, we will hold off on this decision.  I gave her some additional information. 2.  Continue baclofen  3.  Continue her other medications.  4.   She  will follow-up with neurosurgery at [facility] for the meningioma.  She would have periodic MRIs there.  The last MRI did not show any new MS lesions. 5.   She will follow-up in 12 months or sooner if there are new or worsening neurologic symptoms   This visit is part of a comprehensive longitudinal care medical relationship regarding the patients primary diagnosis of multiple sclerosis and related concerns.  Jaelee Laughter A. Felecia Shelling, MD, PhD [id number], [id number] PM Certified in Neurology, Clinical Neurophysiology, Sleep Medicine, Pain Medicine and Neuroimaging  [facility] [address] [place], [place] [address] [phone]

## 2023-03-10 DIAGNOSIS — H43812 Vitreous degeneration, left eye: Secondary | ICD-10-CM | POA: Insufficient documentation

## 2023-03-10 DIAGNOSIS — Z7984 Long term (current) use of oral hypoglycemic drugs: Secondary | ICD-10-CM | POA: Insufficient documentation

## 2023-03-10 DIAGNOSIS — H35372 Puckering of macula, left eye: Secondary | ICD-10-CM | POA: Insufficient documentation

## 2023-03-10 DIAGNOSIS — H35363 Drusen (degenerative) of macula, bilateral: Secondary | ICD-10-CM | POA: Insufficient documentation

## 2023-03-12 ENCOUNTER — Other Ambulatory Visit: Payer: Self-pay

## 2023-03-12 ENCOUNTER — Inpatient Hospital Stay (HOSPITAL_COMMUNITY)
Admission: EM | Admit: 2023-03-12 | Discharge: 2023-03-14 | DRG: 391 | Disposition: A | Discharge status: Home / Self Care | Payer: 59 | Attending: Internal Medicine | Admitting: Internal Medicine

## 2023-03-12 ENCOUNTER — Emergency Department (HOSPITAL_COMMUNITY): Payer: 59

## 2023-03-12 ENCOUNTER — Encounter (HOSPITAL_COMMUNITY): Payer: Self-pay

## 2023-03-12 DIAGNOSIS — K5901 Slow transit constipation: Secondary | ICD-10-CM | POA: Diagnosis present

## 2023-03-12 DIAGNOSIS — K219 Gastro-esophageal reflux disease without esophagitis: Secondary | ICD-10-CM | POA: Diagnosis present

## 2023-03-12 DIAGNOSIS — K225 Diverticulum of esophagus, acquired: Secondary | ICD-10-CM | POA: Diagnosis present

## 2023-03-12 DIAGNOSIS — Z803 Family history of malignant neoplasm of breast: Secondary | ICD-10-CM

## 2023-03-12 DIAGNOSIS — E782 Mixed hyperlipidemia: Secondary | ICD-10-CM | POA: Diagnosis present

## 2023-03-12 DIAGNOSIS — Q396 Congenital diverticulum of esophagus: Secondary | ICD-10-CM | POA: Diagnosis not present

## 2023-03-12 DIAGNOSIS — K229 Disease of esophagus, unspecified: Secondary | ICD-10-CM | POA: Diagnosis not present

## 2023-03-12 DIAGNOSIS — D75839 Thrombocytosis, unspecified: Secondary | ICD-10-CM | POA: Diagnosis present

## 2023-03-12 DIAGNOSIS — Z7984 Long term (current) use of oral hypoglycemic drugs: Secondary | ICD-10-CM | POA: Diagnosis not present

## 2023-03-12 DIAGNOSIS — B3781 Candidal esophagitis: Secondary | ICD-10-CM | POA: Diagnosis present

## 2023-03-12 DIAGNOSIS — E0842 Diabetes mellitus due to underlying condition with diabetic polyneuropathy: Secondary | ICD-10-CM

## 2023-03-12 DIAGNOSIS — D649 Anemia, unspecified: Secondary | ICD-10-CM

## 2023-03-12 DIAGNOSIS — R1310 Dysphagia, unspecified: Principal | ICD-10-CM

## 2023-03-12 DIAGNOSIS — Z9189 Other specified personal risk factors, not elsewhere classified: Secondary | ICD-10-CM | POA: Diagnosis not present

## 2023-03-12 DIAGNOSIS — L89153 Pressure ulcer of sacral region, stage 3: Secondary | ICD-10-CM | POA: Diagnosis present

## 2023-03-12 DIAGNOSIS — K449 Diaphragmatic hernia without obstruction or gangrene: Secondary | ICD-10-CM | POA: Diagnosis present

## 2023-03-12 DIAGNOSIS — Z8542 Personal history of malignant neoplasm of other parts of uterus: Secondary | ICD-10-CM

## 2023-03-12 DIAGNOSIS — Z6841 Body Mass Index (BMI) 40.0 and over, adult: Secondary | ICD-10-CM

## 2023-03-12 DIAGNOSIS — K222 Esophageal obstruction: Secondary | ICD-10-CM | POA: Diagnosis present

## 2023-03-12 DIAGNOSIS — Z8711 Personal history of peptic ulcer disease: Secondary | ICD-10-CM | POA: Diagnosis not present

## 2023-03-12 DIAGNOSIS — Z90710 Acquired absence of both cervix and uterus: Secondary | ICD-10-CM

## 2023-03-12 DIAGNOSIS — D509 Iron deficiency anemia, unspecified: Secondary | ICD-10-CM | POA: Diagnosis present

## 2023-03-12 DIAGNOSIS — G35 Multiple sclerosis: Secondary | ICD-10-CM | POA: Diagnosis not present

## 2023-03-12 DIAGNOSIS — Z79899 Other long term (current) drug therapy: Secondary | ICD-10-CM

## 2023-03-12 DIAGNOSIS — Z8249 Family history of ischemic heart disease and other diseases of the circulatory system: Secondary | ICD-10-CM | POA: Diagnosis not present

## 2023-03-12 DIAGNOSIS — E871 Hypo-osmolality and hyponatremia: Secondary | ICD-10-CM | POA: Diagnosis present

## 2023-03-12 DIAGNOSIS — K5909 Other constipation: Secondary | ICD-10-CM | POA: Diagnosis not present

## 2023-03-12 DIAGNOSIS — Z923 Personal history of irradiation: Secondary | ICD-10-CM | POA: Diagnosis not present

## 2023-03-12 DIAGNOSIS — E119 Type 2 diabetes mellitus without complications: Secondary | ICD-10-CM | POA: Diagnosis not present

## 2023-03-12 DIAGNOSIS — I10 Essential (primary) hypertension: Secondary | ICD-10-CM | POA: Diagnosis present

## 2023-03-12 DIAGNOSIS — E785 Hyperlipidemia, unspecified: Secondary | ICD-10-CM | POA: Diagnosis present

## 2023-03-12 DIAGNOSIS — Z7401 Bed confinement status: Secondary | ICD-10-CM

## 2023-03-12 DIAGNOSIS — Z833 Family history of diabetes mellitus: Secondary | ICD-10-CM

## 2023-03-12 DIAGNOSIS — M1990 Unspecified osteoarthritis, unspecified site: Secondary | ICD-10-CM | POA: Diagnosis present

## 2023-03-12 DIAGNOSIS — Z888 Allergy status to other drugs, medicaments and biological substances status: Secondary | ICD-10-CM

## 2023-03-12 DIAGNOSIS — E1142 Type 2 diabetes mellitus with diabetic polyneuropathy: Secondary | ICD-10-CM | POA: Diagnosis present

## 2023-03-12 LAB — BPAM RBC
ISSUE DATE / TIME: 202404062116
Unit Type and Rh: 5100

## 2023-03-12 LAB — GLUCOSE, CAPILLARY
Glucose-Capillary: 174 mg/dL — ABNORMAL HIGH (ref 70–99)
Glucose-Capillary: 177 mg/dL — ABNORMAL HIGH (ref 70–99)

## 2023-03-12 LAB — COMPREHENSIVE METABOLIC PANEL
ALT: 25 U/L (ref 0–44)
AST: 13 U/L — ABNORMAL LOW (ref 15–41)
Albumin: 2.6 g/dL — ABNORMAL LOW (ref 3.5–5.0)
Alkaline Phosphatase: 80 U/L (ref 38–126)
Anion gap: 11 (ref 5–15)
BUN: 13 mg/dL (ref 8–23)
CO2: 23 mmol/L (ref 22–32)
Calcium: 8.7 mg/dL — ABNORMAL LOW (ref 8.9–10.3)
Chloride: 99 mmol/L (ref 98–111)
Creatinine, Ser: 0.75 mg/dL (ref 0.44–1.00)
GFR, Estimated: >60 mL/min (ref >60)
Glucose, Bld: 185 mg/dL — ABNORMAL HIGH (ref 70–99)
Potassium: 4 mmol/L (ref 3.5–5.1)
Sodium: 133 mmol/L — ABNORMAL LOW (ref 135–145)
Total Bilirubin: 0.5 mg/dL (ref 0.3–1.2)
Total Protein: 7.2 g/dL (ref 6.5–8.1)

## 2023-03-12 LAB — CBC WITH DIFFERENTIAL/PLATELET
Abs Immature Granulocytes: 0.04 10*3/uL (ref 0.00–0.07)
Basophils Absolute: 0 10*3/uL (ref 0.0–0.1)
Basophils Relative: 0 %
Eosinophils Absolute: 0.4 10*3/uL (ref 0.0–0.5)
Eosinophils Relative: 4 %
HCT: 23.4 % — ABNORMAL LOW (ref 36.0–46.0)
Hemoglobin: 6.6 g/dL — CL (ref 12.0–15.0)
Immature Granulocytes: 0 %
Lymphocytes Relative: 14 %
Lymphs Abs: 1.4 10*3/uL (ref 0.7–4.0)
MCH: 21.5 pg — ABNORMAL LOW (ref 26.0–34.0)
MCHC: 28.2 g/dL — ABNORMAL LOW (ref 30.0–36.0)
MCV: 76.2 fL — ABNORMAL LOW (ref 80.0–100.0)
Monocytes Absolute: 0.8 10*3/uL (ref 0.1–1.0)
Monocytes Relative: 8 %
Neutro Abs: 7.5 10*3/uL (ref 1.7–7.7)
Neutrophils Relative %: 74 %
Platelets: 1049 10*3/uL (ref 150–400)
RBC: 3.07 MIL/uL — ABNORMAL LOW (ref 3.87–5.11)
RDW: 17.1 % — ABNORMAL HIGH (ref 11.5–15.5)
WBC: 10.3 10*3/uL (ref 4.0–10.5)
nRBC: 0 % (ref 0.0–0.2)

## 2023-03-12 LAB — PREPARE RBC (CROSSMATCH)

## 2023-03-12 MED ORDER — SODIUM CHLORIDE 0.9% IV SOLUTION: Freq: Once | INTRAVENOUS | Status: AC

## 2023-03-12 MED ORDER — ONDANSETRON HCL 4 MG/2ML IJ SOLN
4.0000 mg | Freq: Once | INTRAMUSCULAR | Status: DC
  Filled 2023-03-12: qty 2

## 2023-03-12 MED ORDER — METHOCARBAMOL 1000 MG/10ML IJ SOLN
500.0000 mg | INTRAVENOUS | Status: AC | PRN
  Administered 2023-03-13: 500 mg via INTRAVENOUS
  Filled 2023-03-12: qty 500

## 2023-03-12 MED ORDER — PANTOPRAZOLE SODIUM 40 MG IV SOLR
40.0000 mg | Freq: Once | INTRAVENOUS | Status: AC
  Administered 2023-03-12: 40 mg via INTRAVENOUS
  Filled 2023-03-12: qty 10

## 2023-03-12 MED ORDER — ACETAMINOPHEN 325 MG PO TABS: 650.0000 mg | ORAL_TABLET | Freq: Four times a day (QID) | ORAL | Status: DC | PRN

## 2023-03-12 MED ORDER — ONDANSETRON HCL 4 MG/2ML IJ SOLN: 4.0000 mg | Freq: Four times a day (QID) | INTRAMUSCULAR | Status: DC | PRN

## 2023-03-12 MED ORDER — ACETAMINOPHEN 650 MG RE SUPP: 650.0000 mg | Freq: Four times a day (QID) | RECTAL | Status: DC | PRN

## 2023-03-12 MED ORDER — MORPHINE SULFATE (PF) 2 MG/ML IV SOLN
1.0000 mg | Freq: Once | INTRAVENOUS | Status: AC
  Administered 2023-03-12: 1 mg via INTRAVENOUS
  Filled 2023-03-12: qty 1

## 2023-03-12 MED ORDER — LEVETIRACETAM IN NACL 500 MG/100ML IV SOLN
500.0000 mg | Freq: Two times a day (BID) | INTRAVENOUS | Status: DC
  Administered 2023-03-12 – 2023-03-14 (×4): 500 mg via INTRAVENOUS
  Filled 2023-03-12 (×5): qty 100

## 2023-03-12 MED ORDER — ONDANSETRON HCL 4 MG PO TABS: 4.0000 mg | ORAL_TABLET | Freq: Four times a day (QID) | ORAL | Status: DC | PRN

## 2023-03-12 NOTE — H&P (Signed)
History and Physical    Patient: [name] ZOX:[id number] DOB: [birth year] DOA: 03/12/2023 DOS: the patient was seen and examined on 03/12/2023 PCP: Porfirio Oar, PA  Patient coming from: Home  Chief Complaint:  Chief Complaint  Patient presents with   Dysphagia   HPI: [name] is a 62 y.o. female with medical history significant of multiple sclerosis, morbid obesity, mixed hyperlipidemia, hypertension, endometrial cancer, history of hysterectomy and radiation therapy, GERD, fibroids, type 2 diabetes with diabetic polyneuropathy, class III obesity with a BMI of 44.79 kg/m, stage III lower back/sacral ulcers following at the wound center, osteoarthritis, chronic anemia, thrombocytosis, slow transit constipation who presented to the emergency department complaints of dysphagia for the past week she has been able to swallow liquids, but is unable to swallow solid foods.  Denied cough or any aspiration into the airways.  No fever, chills or night sweats. No sore throat, rhinorrhea, dyspnea, wheezing or hemoptysis.  No chest pain, palpitations, diaphoresis, PND, orthopnea, but occasionally has pitting edema of the lower extremities.  No abdominal pain, diarrhea,  melena or hematochezia.  No flank pain, dysuria, frequency or hematuria.  No polyuria, polydipsia, polyphagia or blurred vision.  Lab work: Her CBC showed a white count of 10.3, hemoglobin 6.6 g/dL with an MCV of 81.1 fL and platelets of 1049.  CMP showed normal electrolytes and renal function after sodium and calcium correction.  Glucose 185 mg/deciliter, albumin 2.6 g/dL and AST 13 units/L.  Imaging: Portable 1 view chest radiograph with no acute cardiopulmonary abnormality.  ED course: Initial vital signs were temperature 97.8 F, pulse 91, respirations 17, BP 130/62 mmHg O2 sat 100% on room air.   Review of Systems: As mentioned in the history of present illness. All other systems reviewed and are negative.  Past Medical History:   Diagnosis Date   Anemia    history of   Arthritis    Decubitus ulcers    currently treated at wound center   Diabetes mellitus without complication    Diabetic polyneuropathy    Endometrial cancer    Grade 2   Fibroids    GERD (gastroesophageal reflux disease)    Heartburn    Occ   History of radiation therapy 03/20/2019   02/14/2019 - 03/20/2019 Pelvis / 45 Gy in 25 fractions  Dr Antony Blackbird   History of radiation therapy 04/11/2019   03/27/2019, 04/04/2019, 04/11/2019 (HDR, [facility]): Vaginal Cuff (cylinder, 3 cm diameter) / 18 Gy in 3 fractions (3 cm tx length) Dr Antony Blackbird   Hypertension    Mixed hyperlipidemia    Morbid obesity    MS (multiple sclerosis)    Past Surgical History:  Procedure Laterality Date   BRAIN TUMOR EXCISION  09/2020   COLONOSCOPY     CYSTOSCOPY N/A 01/22/2020   Procedure: CYSTOSCOPY WITH SUPRA PUBIC TUBE CHANGE, BOTOX 200 UNITS, RETROGRADE PYELOGRAM BILATERAL. FULGERATION .5 CM-2CM;  Surgeon: Jerilee Field, MD;  Location: [facility];  Service: Urology;  Laterality: N/A;   ESOPHAGOGASTRODUODENOSCOPY N/A 12/02/2017   Procedure: ESOPHAGOGASTRODUODENOSCOPY (EGD);  Surgeon: Kerin Salen, MD;  Location: [facility];  Service: Gastroenterology;  Laterality: N/A;   IRRIGATION AND DEBRIDEMENT BUTTOCKS Right 02/2023   ROBOTIC ASSISTED TOTAL HYSTERECTOMY WITH BILATERAL SALPINGO OOPHERECTOMY Bilateral 12/19/2018   Procedure: XI ROBOTIC ASSISTED TOTAL HYSTERECTOMY (UTERUS GREATER THAN 250gr) WITH BILATERAL SALPINGO OOPHORECTOMY;  Surgeon: Adolphus Birchwood, MD;  Location: [facility];  Service: Gynecology;  Laterality: Bilateral;   SENTINEL NODE BIOPSY N/A 12/19/2018   Procedure: SENTINEL NODE BIOPSY;  Surgeon: Adolphus Birchwood, MD;  Location: [facility];  Service: Gynecology;  Laterality: N/A;   SUPRAPUBIC CATHETER INSERTION  2009   TONSILLECTOMY     TUBAL LIGATION     Social History:  reports that she has never smoked. She has never used smokeless tobacco. She reports that she  does not drink alcohol and does not use drugs.  Allergies  Allergen Reactions   Ace Inhibitors Other (See Comments)    Angioedema    Lisinopril Other (See Comments)    angioedema    Family History  Problem Relation Age of Onset   Hypertension Mother    Breast cancer Mother        spread all over   Heart disease Father    Diabetes Father    Hypertension Sister    Hypertension Brother    Colon cancer Neg Hx    Esophageal cancer Neg Hx    Rectal cancer Neg Hx     Prior to Admission medications   Medication Sig Start Date End Date Taking? Authorizing Provider  ACCU-CHEK GUIDE test strip TEST 1 ONCE DAILY. 02/03/22   [provider]  atorvastatin (LIPITOR) 10 MG tablet Take 1 tablet by mouth daily. 02/03/22   [provider]  atorvastatin (LIPITOR) 20 MG tablet Take 20 mg by mouth every evening. 04/06/22   [provider]  baclofen (LIORESAL) 20 MG tablet TAKE ONE TABLET BY MOUTH THREE TIMES DAILY AND TAKE TWO TABLET BY MOUTH AT BEDTIME Patient taking differently: Take 20-60 mg by mouth See admin instructions. Take 20 mg in the morning and 60 mg at night. 03/22/22   Lomax, Amy, NP  Cholecalciferol (VITAMIN D3 PO) Take 5,000 Units by mouth daily.    [provider]  clotrimazole (LOTRIMIN) 1 % cream Apply to both feet and between toes twice daily for 4 weeks. 08/28/20   Freddie Breech, DPM  furosemide (LASIX) 20 MG tablet TAKE 1 TABLET BY MOUTH ONCE DAILY Patient taking differently: Take 20 mg by mouth daily. 10/20/19   Doristine Bosworth, MD  glipiZIDE (GLUCOTROL) 5 MG tablet Take 1 tablet (5 mg total) by mouth daily before lunch. Patient taking differently: Take 5 mg by mouth in the morning and at bedtime. 05/11/17   Doristine Bosworth, MD  hydrochlorothiazide (HYDRODIURIL) 25 MG tablet TAKE 1 TABLET BY MOUTH ONCE DAILY EVERY MORNING 04/21/20   Doristine Bosworth, MD  HYDROcodone-acetaminophen (NORCO) 10-325 MG tablet Take by mouth. 11/08/22 03/16/23   [provider]  levETIRAcetam (KEPPRA) 500 MG tablet Take 500 mg by mouth 2 (two) times daily. 02/14/22   [provider]  lisinopril (ZESTRIL) 2.5 MG tablet Take 1 tablet by mouth daily. 02/03/22   [provider]  metFORMIN (GLUCOPHAGE) 1000 MG tablet Take by mouth. 02/03/22   [provider]  NON FORMULARY Power wheelchair repairs  Diagnosis code z99.3 04/13/17   Doristine Bosworth, MD  NON FORMULARY Dispense catheter urine drainage bags 07/19/18   Doristine Bosworth, MD  Ostomy Supplies (NEW IMAGE SKIN/FLANGE/TAPE) MISC 1 application by Does not apply route daily as needed. 07/19/18   Doristine Bosworth, MD  potassium chloride (MICRO-K) 10 MEQ CR capsule Take 30 mEq by mouth 2 (two) times daily.  01/07/20   [provider]    Physical Exam: Vitals:   03/12/23 1515 03/12/23 1519  BP: 130/62   Pulse: 91   Resp: 17   Temp: 97.8 F (36.6 C)   SpO2: 100%  Weight:  129.7 kg  Height:   (1.702 m)   Physical Exam Vitals and nursing note reviewed.  Constitutional:      General: She is awake. She is not in acute distress.    Appearance: Normal appearance. She is morbidly obese.  HENT:     Head: Normocephalic.     Nose: No rhinorrhea.     Mouth/Throat:     Mouth: Mucous membranes are moist.  Eyes:     General: No scleral icterus.    Pupils: Pupils are equal, round, and reactive to light.  Neck:     Vascular: No JVD.  Cardiovascular:     Rate and Rhythm: Normal rate and regular rhythm.     Heart sounds: S1 normal and S2 normal.  Pulmonary:     Effort: Pulmonary effort is normal.     Breath sounds: Normal breath sounds. No wheezing, rhonchi or rales.  Abdominal:     General: Abdomen is protuberant. Bowel sounds are normal.     Palpations: Abdomen is soft.     Tenderness: There is no abdominal tenderness. There is no right CVA tenderness or left CVA tenderness.  Musculoskeletal:     Cervical back: Neck supple.     Right lower leg: No edema.      Left lower leg: No edema.  Skin:    General: Skin is warm and dry.     Comments: Stage III decubiti pressure ulcers.  Unable to fully evaluate.  Neurological:     General: No focal deficit present.     Mental Status: She is alert and oriented to person, place, and time.  Psychiatric:        Mood and Affect: Mood normal.        Behavior: Behavior normal. Behavior is cooperative.    Data Reviewed:  Results are pending, will review when available.  Assessment and Plan: Principal Problem:   Dysphagia Inpatient/MedSurg. Keep n.p.o. for now. Antiemetics as needed. Pantoprazole 40 mg IVP daily. Follow CBC, CMP and lipase in AM. GI will be seeing in the morning.  Active Problems:   Thrombocytosis Monitor platelet count.    Microcytic anemia Transfuse 1 unit of PRBC. Follow-up hemoglobin in AM.    Essential hypertension Parenteral antihypertensives as needed.    Multiple sclerosis Supportive care.    Mixed hyperlipidemia Hold statin for now.    Type 2 diabetes mellitus with diabetic  polyneuropathy, without long-term current use of insulin Currently p.o. CBG monitoring every 6 hours.    Morbid obesity Current BMI 44.79 kg/m. Follow-up with PCP as an outpatient.    Pressure injury of sacral region, stage 3 Low gravity bed will be provided. Consult wound and ostomy care.    Advance Care Planning:   Code Status: Full Code   Consults: [facility] will be seeing in AM.  Family Communication:   Severity of Illness: The appropriate patient status for this patient is INPATIENT. Inpatient status is judged to be reasonable and necessary in order to provide the required intensity of service to ensure the patient's safety. The patient's presenting symptoms, physical exam findings, and initial radiographic and laboratory data in the context of their chronic comorbidities is felt to place them at high risk for further clinical deterioration. Furthermore, it is not  anticipated that the patient will be medically stable for discharge from the hospital within 2 midnights of admission.   * I certify that at the point of admission it is my clinical judgment that the  patient will require inpatient hospital care spanning beyond 2 midnights from the point of admission due to high intensity of service, high risk for further deterioration and high frequency of surveillance required.*  Author: Bobette Moavid Manuel Anasofia Micallef, MD 03/12/2023 6:09 PM  For on call review [email or web address].   This document was prepared using Dragon voice recognition software and may contain some unintended transcription errors.

## 2023-03-12 NOTE — ED Notes (Signed)
ED TO INPATIENT HANDOFF REPORT  Name/Age/Gender [name] 62 y.o. female  Code Status    Code Status Orders  (From admission, onward)           Start     Ordered   03/12/23 1813  Full code  Continuous       Question:  By:  Answer:  Consent: discussion documented in EHR   03/12/23 1813           Code Status History     Date Active Date Inactive Code Status Order ID Comments User Context   05/01/2022 1303 05/03/2022 0051 Full Code [id number]  Chilton GreathouseMannam, Praveen, MD ED   12/19/2018 1809 12/20/2018 1839 Full Code [id number]  Antionette CharJackson-Moore, Lisa, MD Inpatient   12/19/2018 0942 12/19/2018 1809 Full Code [id number]  Doylene Bodeross, Melissa D, NP Inpatient       Home/SNF/Other Home  Chief Complaint Dysphagia [R13.10]  Level of Care/Admitting Diagnosis ED Disposition     ED Disposition  Admit   Condition  --   Comment  [facility] [[id number]]  Level of Care: Med-Surg [16]  [facility] if equivalent level of care is available:: No  Covid Evaluation: Asymptomatic - no recent exposure (last 10 days) testing not required  Diagnosis: Dysphagia [401027][[id number]]  Admitting Physician: Bobette MoORTIZ, DAVID MANUEL [[id number]]  Attending Physician: Bobette MoORTIZ, DAVID MANUEL [[id number]]  Certification:: I certify this patient will need inpatient services for at least 2 midnights  Estimated Length of Stay: 2          Medical History Past Medical History:  Diagnosis Date   Anemia    history of   Arthritis    Decubitus ulcers    currently treated at wound center   Diabetes mellitus without complication    Diabetic polyneuropathy    Endometrial cancer    Grade 2   Fibroids    GERD (gastroesophageal reflux disease)    Heartburn    Occ   History of radiation therapy 03/20/2019   02/14/2019 - 03/20/2019 Pelvis / 45 Gy in 25 fractions  Dr Antony BlackbirdJames Kinard   History of radiation therapy 04/11/2019   03/27/2019, 04/04/2019, 04/11/2019 (HDR,  Brachy): Vaginal Cuff (cylinder, 3 cm diameter) / 18 Gy in 3 fractions (3 cm tx length) Dr Antony BlackbirdJames Kinard   Hypertension    Mixed hyperlipidemia    Morbid obesity    MS (multiple sclerosis)     Allergies Allergies  Allergen Reactions   Ace Inhibitors Other (See Comments)    Angioedema    Lisinopril Other (See Comments)    angioedema    IV Location/Drains/Wounds Patient Lines/Drains/Airways Status     Active Line/Drains/Airways     Name Placement date Placement time Site Days   Peripheral IV 03/12/23 20 G Left;Posterior Forearm 03/12/23  1643  Forearm  less than 1   Suprapubic Catheter Non-latex 18 Fr. 01/22/20  1236  Non-latex  1145   Pressure Injury 05/01/22 Buttocks Right;Left Stage 3 -  Full thickness tissue loss. Subcutaneous fat may be visible but bone, tendon or muscle are NOT exposed. 05/01/22  1400  -- 315            Labs/Imaging Results for orders placed or performed during the hospital encounter of 03/12/23 (from the past 48 hour(s))  Comprehensive metabolic panel     Status: Abnormal   Collection Time: 03/12/23  3:52 PM  Result Value Ref Range   Sodium 133 (L) 135 -  145 mmol/L   Potassium 4.0 3.5 - 5.1 mmol/L   Chloride 99 98 - 111 mmol/L   CO2 23 22 - 32 mmol/L   Glucose, Bld 185 (H) 70 - 99 mg/dL    Comment: Glucose reference range applies only to samples taken after fasting for at least 8 hours.   BUN 13 8 - 23 mg/dL   Creatinine, Ser 4.13 0.44 - 1.00 mg/dL   Calcium 8.7 (L) 8.9 - 10.3 mg/dL   Total Protein 7.2 6.5 - 8.1 g/dL   Albumin 2.6 (L) 3.5 - 5.0 g/dL   AST 13 (L) 15 - 41 U/L   ALT 25 0 - 44 U/L   Alkaline Phosphatase 80 38 - 126 U/L   Total Bilirubin 0.5 0.3 - 1.2 mg/dL   GFR, Estimated >24 >40 mL/min    Comment: (NOTE) Calculated using the CKD-EPI Creatinine Equation (2021)    Anion gap 11 5 - 15    Comment: Performed at [facility], [address] [place], [place] [address]  CBC with Differential     Status:  Abnormal   Collection Time: 03/12/23  3:52 PM  Result Value Ref Range   WBC 10.3 4.0 - 10.5 K/uL   RBC 3.07 (L) 3.87 - 5.11 MIL/uL   Hemoglobin 6.6 (LL) 12.0 - 15.0 g/dL    Comment: REPEATED TO VERIFY Reticulocyte Hemoglobin testing may be clinically indicated, consider ordering this additional test [id number] THIS CRITICAL RESULT HAS VERIFIED AND BEEN CALLED TO [facility]. RN BY NICOLE MCCOY ON 04 06 2024 AT 1639, AND HAS BEEN READ BACK.     HCT 23.4 (L) 36.0 - 46.0 %   MCV 76.2 (L) 80.0 - 100.0 fL   MCH 21.5 (L) 26.0 - 34.0 pg   MCHC 28.2 (L) 30.0 - 36.0 g/dL   RDW 47.4 (H) 25.9 - 56.3 %   Platelets 1,049 (HH) 150 - 400 K/uL    Comment: REPEATED TO VERIFY THIS CRITICAL RESULT HAS VERIFIED AND BEEN CALLED TO [facility]. RN BY NICOLE MCCOY ON 04 06 2024 AT 1639, AND HAS BEEN READ BACK.     nRBC 0.0 0.0 - 0.2 %   Neutrophils Relative % 74 %   Neutro Abs 7.5 1.7 - 7.7 K/uL   Lymphocytes Relative 14 %   Lymphs Abs 1.4 0.7 - 4.0 K/uL   Monocytes Relative 8 %   Monocytes Absolute 0.8 0.1 - 1.0 K/uL   Eosinophils Relative 4 %   Eosinophils Absolute 0.4 0.0 - 0.5 K/uL   Basophils Relative 0 %   Basophils Absolute 0.0 0.0 - 0.1 K/uL   Immature Granulocytes 0 %   Abs Immature Granulocytes 0.04 0.00 - 0.07 K/uL    Comment: Performed at [facility], [address] [place], [place] [address]   DG Chest Portable 1 View  Result Date: 03/12/2023 CLINICAL DATA:  dysphagia EXAM: PORTABLE CHEST 1 VIEW COMPARISON:  None Available. FINDINGS: Evaluation is limited by positioning. The cardiomediastinal silhouette is the upper limits of normal in contour. No pleural effusion. No pneumothorax. No acute pleuroparenchymal abnormality. IMPRESSION: 1. No acute cardiopulmonary abnormality. Electronically Signed   By: Meda Klinefelter M.D.   On: 03/12/2023 16:22    Pending Labs Unresulted Labs (From admission, onward)     Start     Ordered   03/13/23 0500  HIV Antibody (routine testing w  rflx)  (HIV Antibody (Routine testing w reflex) panel)  Tomorrow morning,   R  03/12/23 1813   03/13/23 0500  CBC  Tomorrow morning,   R        03/12/23 1813   03/13/23 0500  Comprehensive metabolic panel  Tomorrow morning,   R        03/12/23 1813   03/12/23 1815  Prepare RBC (crossmatch)  (Blood Administration Adult)  Once,   R       Question Answer Comment  # of Units 1 unit   Transfusion Indications Hemoglobin < 7 gm/dL and symptomatic   Number of Units to Keep Ahead NO units ahead   If emergent release call blood bank Not emergent release      03/12/23 1814   03/12/23 1705  Type and screen [facility]  Once,   STAT       Comments: [facility]    03/12/23 1705            Vitals/Pain Today's Vitals   03/12/23 1515 03/12/23 1519  BP: 130/62   Pulse: 91   Resp: 17   Temp: 97.8 F (36.6 C)   SpO2: 100%   Weight:  129.7 kg  Height:  5\' 7"  (1.702 m)  PainSc:  6     Isolation Precautions No active isolations  Medications Medications  ondansetron (ZOFRAN) injection 4 mg (has no administration in time range)  acetaminophen (TYLENOL) tablet 650 mg (has no administration in time range)    Or  acetaminophen (TYLENOL) suppository 650 mg (has no administration in time range)  ondansetron (ZOFRAN) tablet 4 mg (has no administration in time range)    Or  ondansetron (ZOFRAN) injection 4 mg (has no administration in time range)  pantoprazole (PROTONIX) injection 40 mg (has no administration in time range)  0.9 %  sodium chloride infusion (Manually program via Guardrails IV Fluids) (has no administration in time range)    Mobility non-ambulatory

## 2023-03-12 NOTE — ED Triage Notes (Signed)
Patient brought in by EMS due to difficulty swallowing. Patient reports that this started on Sunday. Reports that she is able to swallow liquids, but has not been able to swallow any solid food.

## 2023-03-12 NOTE — ED Provider Notes (Addendum)
[place] [facility] AT [facility] Provider Note   CSN: [id number] Arrival date & time: 03/12/23  1506     History  Chief Complaint  Patient presents with   Dysphagia    [name] is a 62 y.o. female.  HPI Patient presents with difficulty swallowing.  States food is getting caught in her lower throat.  Has had for about a week now.  States she can drink liquids and soft solids if she chews them really well but has had things getting caught and had to throw them up.  No fevers.  States she does not have a gastroenterologist but reviewing notes it appears she has seen Dr. Marca Ancona in the past.  Has a history of MS and is bedbound.   Past Medical History:  Diagnosis Date   Anemia    history of   Arthritis    Decubitus ulcers    currently treated at wound center   Diabetes mellitus without complication    Diabetic polyneuropathy    Endometrial cancer    Grade 2   Fibroids    GERD (gastroesophageal reflux disease)    Heartburn    Occ   History of radiation therapy 03/20/2019   02/14/2019 - 03/20/2019 Pelvis / 45 Gy in 25 fractions  Dr Antony Blackbird   History of radiation therapy 04/11/2019   03/27/2019, 04/04/2019, 04/11/2019 (HDR, [facility]): Vaginal Cuff (cylinder, 3 cm diameter) / 18 Gy in 3 fractions (3 cm tx length) Dr Antony Blackbird   Hypertension    Mixed hyperlipidemia    Morbid obesity    MS (multiple sclerosis)     Home Medications Prior to Admission medications   Medication Sig Start Date End Date Taking? Authorizing Provider  ACCU-CHEK GUIDE test strip TEST 1 ONCE DAILY. 02/03/22   [provider]  atorvastatin (LIPITOR) 10 MG tablet Take 1 tablet by mouth daily. 02/03/22   [provider]  atorvastatin (LIPITOR) 20 MG tablet Take 20 mg by mouth every evening. 04/06/22   [provider]  baclofen (LIORESAL) 20 MG tablet TAKE ONE TABLET BY MOUTH THREE TIMES DAILY AND TAKE TWO TABLET BY MOUTH AT BEDTIME Patient taking  differently: Take 20-60 mg by mouth See admin instructions. Take 20 mg in the morning and 60 mg at night. 03/22/22   Lomax, Amy, NP  Cholecalciferol (VITAMIN D3 PO) Take 5,000 Units by mouth daily.    [provider]  clotrimazole (LOTRIMIN) 1 % cream Apply to both feet and between toes twice daily for 4 weeks. 08/28/20   Freddie Breech, DPM  furosemide (LASIX) 20 MG tablet TAKE 1 TABLET BY MOUTH ONCE DAILY Patient taking differently: Take 20 mg by mouth daily. 10/20/19   Doristine Bosworth, MD  glipiZIDE (GLUCOTROL) 5 MG tablet Take 1 tablet (5 mg total) by mouth daily before lunch. Patient taking differently: Take 5 mg by mouth in the morning and at bedtime. 05/11/17   Doristine Bosworth, MD  hydrochlorothiazide (HYDRODIURIL) 25 MG tablet TAKE 1 TABLET BY MOUTH ONCE DAILY EVERY MORNING 04/21/20   Doristine Bosworth, MD  HYDROcodone-acetaminophen (NORCO) 10-325 MG tablet Take by mouth. 11/08/22 03/16/23  [provider]  levETIRAcetam (KEPPRA) 500 MG tablet Take 500 mg by mouth 2 (two) times daily. 02/14/22   [provider]  lisinopril (ZESTRIL) 2.5 MG tablet Take 1 tablet by mouth daily. 02/03/22   [provider]  metFORMIN (GLUCOPHAGE) 1000 MG tablet Take by mouth. 02/03/22   [provider]  NON FORMULARY Power wheelchair repairs  Diagnosis code z99.3 04/13/17   Doristine Bosworth, MD  NON FORMULARY Dispense catheter urine drainage bags 07/19/18   Doristine Bosworth, MD  Ostomy Supplies (NEW IMAGE SKIN/FLANGE/TAPE) MISC 1 application by Does not apply route daily as needed. 07/19/18   Doristine Bosworth, MD  potassium chloride (MICRO-K) 10 MEQ CR capsule Take 30 mEq by mouth 2 (two) times daily.  01/07/20   [provider]      Allergies    Ace inhibitors and Lisinopril    Review of Systems   Review of Systems  Physical Exam Updated Vital Signs BP 130/62   Pulse 91   Temp 97.8 F (36.6 C)   Resp 17   Ht 5\' 7"  (1.702 m)   Wt 129.7 kg   SpO2 100%    BMI 44.79 kg/m  Physical Exam Vitals and nursing note reviewed.  Constitutional:      Appearance: She is obese.  Cardiovascular:     Rate and Rhythm: Normal rate.  Pulmonary:     Breath sounds: No wheezing or rhonchi.  Abdominal:     Tenderness: There is no abdominal tenderness.  Musculoskeletal:     Cervical back: No tenderness.  Skin:    General: Skin is warm.     Capillary Refill: Capillary refill takes less than 2 seconds.  Neurological:     Mental Status: She is alert and oriented to person, place, and time.     ED Results / Procedures / Treatments   Labs (all labs ordered are listed, but only abnormal results are displayed) Labs Reviewed  COMPREHENSIVE METABOLIC PANEL - Abnormal; Notable for the following components:      Result Value   Sodium 133 (*)    Glucose, Bld 185 (*)    Calcium 8.7 (*)    Albumin 2.6 (*)    AST 13 (*)    All other components within normal limits  CBC WITH DIFFERENTIAL/PLATELET - Abnormal; Notable for the following components:   RBC 3.07 (*)    Hemoglobin 6.6 (*)    HCT 23.4 (*)    MCV 76.2 (*)    MCH 21.5 (*)    MCHC 28.2 (*)    RDW 17.1 (*)    Platelets 1,049 (*)    All other components within normal limits    EKG None  Radiology DG Chest Portable 1 View  Result Date: 03/12/2023 CLINICAL DATA:  dysphagia EXAM: PORTABLE CHEST 1 VIEW COMPARISON:  None Available. FINDINGS: Evaluation is limited by positioning. The cardiomediastinal silhouette is the upper limits of normal in contour. No pleural effusion. No pneumothorax. No acute pleuroparenchymal abnormality. IMPRESSION: 1. No acute cardiopulmonary abnormality. Electronically Signed   By: Meda Klinefelter M.D.   On: 03/12/2023 16:22    Procedures Procedures    Medications Ordered in ED Medications  ondansetron (ZOFRAN) injection 4 mg (has no administration in time range)    ED Course/ Medical Decision Making/ A&P                             Medical Decision  Making Amount and/or Complexity of Data Reviewed Labs: ordered. Radiology: ordered.  Risk Prescription drug management.   Patient presents with dysphagia, feels food getting caught in the lower throat.  Differential diagnosis includes mechanical obstruction, esophageal issues, potentially even MS related although I think this is less likely.  Will get basic blood work and  chest x-ray to start.  States she is really not been able to eat and drink much over the last week.  I reviewed report from previous EGD.  Lab work shows a mildly worsening anemia.  Hemoglobin down to 6.8.  Had recently been 7.  Platelets also going up.  I think with the anemia and unable to eat solids patient benefit from more urgent workup.  I think the edema is likely secondary to the oozing she has had from her chronic sacral wounds.  Will discuss with GI and will admit to internal medicine.  Discussed with Dr. Robb Matarrtiz from internal medicine.  He will admit.  Discussed with Dr. Bosie ClosSchooler from [facility].  He found a note from [facility] from around a year ago.  Discussed with Dr. Leonides Schanzorsey from [facility] and they will round on patient tomorrow.        Final Clinical Impression(s) / ED Diagnoses Final diagnoses:  Dysphagia, unspecified type  Anemia, unspecified type    Rx / DC Orders ED Discharge Orders     None         Benjiman CorePickering, Jlyn Cerros, MD 03/12/23 1700    Benjiman CorePickering, Anicka Stuckert, MD 03/12/23 1735

## 2023-03-13 ENCOUNTER — Encounter (HOSPITAL_COMMUNITY)
Admission: EM | Disposition: A | Discharge status: Home / Self Care | Payer: Self-pay | Source: Home / Self Care | Attending: Internal Medicine

## 2023-03-13 ENCOUNTER — Encounter (HOSPITAL_COMMUNITY): Payer: Self-pay

## 2023-03-13 ENCOUNTER — Inpatient Hospital Stay (HOSPITAL_COMMUNITY): Payer: 59 | Admitting: Certified Registered Nurse Anesthetist

## 2023-03-13 DIAGNOSIS — G35 Multiple sclerosis: Secondary | ICD-10-CM | POA: Diagnosis not present

## 2023-03-13 DIAGNOSIS — Z9189 Other specified personal risk factors, not elsewhere classified: Secondary | ICD-10-CM

## 2023-03-13 DIAGNOSIS — I10 Essential (primary) hypertension: Secondary | ICD-10-CM | POA: Diagnosis not present

## 2023-03-13 DIAGNOSIS — Q396 Congenital diverticulum of esophagus: Secondary | ICD-10-CM

## 2023-03-13 DIAGNOSIS — K449 Diaphragmatic hernia without obstruction or gangrene: Secondary | ICD-10-CM | POA: Diagnosis not present

## 2023-03-13 DIAGNOSIS — K229 Disease of esophagus, unspecified: Secondary | ICD-10-CM | POA: Diagnosis not present

## 2023-03-13 DIAGNOSIS — K222 Esophageal obstruction: Secondary | ICD-10-CM

## 2023-03-13 DIAGNOSIS — R1310 Dysphagia, unspecified: Secondary | ICD-10-CM | POA: Diagnosis not present

## 2023-03-13 DIAGNOSIS — E119 Type 2 diabetes mellitus without complications: Secondary | ICD-10-CM

## 2023-03-13 DIAGNOSIS — B3781 Candidal esophagitis: Secondary | ICD-10-CM | POA: Diagnosis not present

## 2023-03-13 DIAGNOSIS — Z8711 Personal history of peptic ulcer disease: Secondary | ICD-10-CM

## 2023-03-13 DIAGNOSIS — D649 Anemia, unspecified: Secondary | ICD-10-CM | POA: Diagnosis not present

## 2023-03-13 DIAGNOSIS — K5909 Other constipation: Secondary | ICD-10-CM

## 2023-03-13 DIAGNOSIS — Z7984 Long term (current) use of oral hypoglycemic drugs: Secondary | ICD-10-CM

## 2023-03-13 HISTORY — PX: ESOPHAGOGASTRODUODENOSCOPY (EGD) WITH PROPOFOL: SHX5813

## 2023-03-13 HISTORY — PX: BIOPSY: SHX5522

## 2023-03-13 HISTORY — PX: ESOPHAGEAL DILATION: SHX303

## 2023-03-13 LAB — COMPREHENSIVE METABOLIC PANEL
ALT: 20 U/L (ref 0–44)
AST: 9 U/L — ABNORMAL LOW (ref 15–41)
Albumin: 2.6 g/dL — ABNORMAL LOW (ref 3.5–5.0)
Alkaline Phosphatase: 76 U/L (ref 38–126)
Anion gap: 9 (ref 5–15)
BUN: 11 mg/dL (ref 8–23)
CO2: 23 mmol/L (ref 22–32)
Calcium: 8.8 mg/dL — ABNORMAL LOW (ref 8.9–10.3)
Chloride: 101 mmol/L (ref 98–111)
Creatinine, Ser: 0.65 mg/dL (ref 0.44–1.00)
GFR, Estimated: >60 mL/min (ref >60)
Glucose, Bld: 173 mg/dL — ABNORMAL HIGH (ref 70–99)
Potassium: 3.7 mmol/L (ref 3.5–5.1)
Sodium: 133 mmol/L — ABNORMAL LOW (ref 135–145)
Total Bilirubin: 0.6 mg/dL (ref 0.3–1.2)
Total Protein: 7 g/dL (ref 6.5–8.1)

## 2023-03-13 LAB — TYPE AND SCREEN
ABO/RH(D): O POS
Antibody Screen: NEGATIVE
Unit division: 0

## 2023-03-13 LAB — BPAM RBC: Blood Product Expiration Date: 202405132359

## 2023-03-13 LAB — GLUCOSE, CAPILLARY
Glucose-Capillary: 185 mg/dL — ABNORMAL HIGH (ref 70–99)
Glucose-Capillary: 168 mg/dL — ABNORMAL HIGH (ref 70–99)
Glucose-Capillary: 185 mg/dL — ABNORMAL HIGH (ref 70–99)

## 2023-03-13 LAB — CBC
HCT: 25.2 % — ABNORMAL LOW (ref 36.0–46.0)
Hemoglobin: 7.4 g/dL — ABNORMAL LOW (ref 12.0–15.0)
MCH: 22.8 pg — ABNORMAL LOW (ref 26.0–34.0)
MCHC: 29.4 g/dL — ABNORMAL LOW (ref 30.0–36.0)
MCV: 77.5 fL — ABNORMAL LOW (ref 80.0–100.0)
Platelets: 967 10*3/uL (ref 150–400)
RBC: 3.25 MIL/uL — ABNORMAL LOW (ref 3.87–5.11)
RDW: 18.3 % — ABNORMAL HIGH (ref 11.5–15.5)
WBC: 9.3 10*3/uL (ref 4.0–10.5)
nRBC: 0 % (ref 0.0–0.2)

## 2023-03-13 LAB — HIV ANTIBODY (ROUTINE TESTING W REFLEX): HIV Screen 4th Generation wRfx: NONREACTIVE

## 2023-03-13 SURGERY — ESOPHAGOGASTRODUODENOSCOPY (EGD) WITH PROPOFOL
Anesthesia: Monitor Anesthesia Care

## 2023-03-13 MED ORDER — LIDOCAINE 2% (20 MG/ML) 5 ML SYRINGE
INTRAMUSCULAR | Status: DC | PRN
  Administered 2023-03-13: 40 mg via INTRAVENOUS

## 2023-03-13 MED ORDER — PANTOPRAZOLE SODIUM 40 MG IV SOLR
40.0000 mg | Freq: Two times a day (BID) | INTRAVENOUS | Status: DC
  Administered 2023-03-13 – 2023-03-14 (×2): 40 mg via INTRAVENOUS
  Filled 2023-03-13 (×2): qty 10

## 2023-03-13 MED ORDER — CHLORHEXIDINE GLUCONATE CLOTH 2 % EX PADS
6.0000 | MEDICATED_PAD | Freq: Every day | CUTANEOUS | Status: DC
  Administered 2023-03-13 – 2023-03-14 (×2): 6 via TOPICAL

## 2023-03-13 MED ORDER — PROPOFOL 500 MG/50ML IV EMUL
INTRAVENOUS | Status: DC | PRN
  Administered 2023-03-13: 150 ug/kg/min via INTRAVENOUS

## 2023-03-13 MED ORDER — PROPOFOL 10 MG/ML IV BOLUS
INTRAVENOUS | Status: DC | PRN
  Administered 2023-03-13: 20 mg via INTRAVENOUS
  Administered 2023-03-13: 30 mg via INTRAVENOUS

## 2023-03-13 MED ORDER — METHOCARBAMOL 1000 MG/10ML IJ SOLN: 500.0000 mg | Freq: Four times a day (QID) | INTRAVENOUS | Status: DC | PRN

## 2023-03-13 MED ORDER — FLUCONAZOLE IN SODIUM CHLORIDE 200-0.9 MG/100ML-% IV SOLN
200.0000 mg | INTRAVENOUS | Status: DC
  Administered 2023-03-13: 200 mg via INTRAVENOUS
  Filled 2023-03-13 (×2): qty 100

## 2023-03-13 MED ORDER — PROPOFOL 500 MG/50ML IV EMUL
INTRAVENOUS | Status: AC
  Filled 2023-03-13: qty 50

## 2023-03-13 MED ORDER — LACTATED RINGERS IV SOLN: INTRAVENOUS | Status: DC | PRN

## 2023-03-13 MED ORDER — MORPHINE SULFATE (PF) 2 MG/ML IV SOLN
0.5000 mg | INTRAVENOUS | Status: DC | PRN
  Administered 2023-03-13 – 2023-03-14 (×5): 1 mg via INTRAVENOUS
  Filled 2023-03-13 (×5): qty 1

## 2023-03-13 SURGICAL SUPPLY — 15 items

## 2023-03-13 NOTE — Consult Note (Signed)
 Consult Note   Referring Provider: [facility] Primary Care Physician:  Jeffery, Chelle, PA Primary Gastroenterologist:  Dr. Henry Danis  Reason for Consultation: Dysphagia   Assessment    Dysphagia to solids and partially to liquids for 1 week.  History of GERD.  R/O esophagitis - infectious or reflux related, pill ulcer (Micro-K), stricture, neoplasm. History of gastric ulcers and erosive gastritis in 2018 Chronic constipation likely related to poor motility due to MS, immobility and opioids Microcytic anemia, R/O iron deficiency MS and multiple additional medical problems as outlined in HPI   Recommendations    Pantoprazole 40 mg IV twice daily for now EGD today. The risks (including bleeding, perforation, infection, missed lesions, medication reactions and possible hospitalization or surgery if complications occur), benefits, and alternatives to endoscopy with possible biopsy and possible dilation were discussed with the patient and they consent to proceed.   Fe, TIBC, ferritin - use pretransfusion blood specimen if possible    HPI: [name] is a 62 y.o. female with a past medical history of multiple sclerosis, morbid obesity, endometrial cancer status post hysterectomy and radiation therapy, GERD, chronic constipation, DM2, hyperlipidemia, hypertension, lower back/sacral ulcers, osteoarthritis, chronic anemia and thrombocytosis.  She presented to the ED relating inability to swallow solids and some of her medications.  She has limited ability to swallow liquids.  Symptoms began acutely 1 week ago.  She relates no recent medication changes or antibiotics. Denies weight loss, abdominal pain, constipation, diarrhea, change in stool caliber, melena, hematochezia, nausea, vomiting, chest pain.    Past Medical History:  Diagnosis Date   Anemia    history of   Arthritis    Decubitus ulcers    currently treated at wound center   Diabetes mellitus without complication     Diabetic polyneuropathy    Endometrial cancer    Grade 2   Fibroids    GERD (gastroesophageal reflux disease)    Heartburn    Occ   History of radiation therapy 03/20/2019   02/14/2019 - 03/20/2019 Pelvis / 45 Gy in 25 fractions  Dr James Kinard   History of radiation therapy 04/11/2019   03/27/2019, 04/04/2019, 04/11/2019 (HDR, [facility]): Vaginal Cuff (cylinder, 3 cm diameter) / 18 Gy in 3 fractions (3 cm tx length) Dr James Kinard   Hypertension    Mixed hyperlipidemia    Morbid obesity    MS (multiple sclerosis)     Past Surgical History:  Procedure Laterality Date   BRAIN TUMOR EXCISION  09/2020   COLONOSCOPY     CYSTOSCOPY N/A 01/22/2020   Procedure: CYSTOSCOPY WITH SUPRA PUBIC TUBE CHANGE, BOTOX 200 UNITS, RETROGRADE PYELOGRAM BILATERAL. FULGERATION .5 CM-2CM;  Surgeon: Eskridge, Matthew, MD;  Location: [facility];  Service: Urology;  Laterality: N/A;   ESOPHAGOGASTRODUODENOSCOPY N/A 12/02/2017   Procedure: ESOPHAGOGASTRODUODENOSCOPY (EGD);  Surgeon: Karki, Arya, MD;  Location: [facility];  Service: Gastroenterology;  Laterality: N/A;   IRRIGATION AND DEBRIDEMENT BUTTOCKS Right 02/2023   ROBOTIC ASSISTED TOTAL HYSTERECTOMY WITH BILATERAL SALPINGO OOPHERECTOMY Bilateral 12/19/2018   Procedure: XI ROBOTIC ASSISTED TOTAL HYSTERECTOMY (UTERUS GREATER THAN 250gr) WITH BILATERAL SALPINGO OOPHORECTOMY;  Surgeon: Rossi, Emma, MD;  Location: [facility];  Service: Gynecology;  Laterality: Bilateral;   SENTINEL NODE BIOPSY N/A 12/19/2018   Procedure: SENTINEL NODE BIOPSY;  Surgeon: Rossi, Emma, MD;  Location: [facility];  Service: Gynecology;  Laterality: N/A;   SUPRAPUBIC CATHETER INSERTION  2009   TONSILLECTOMY     TUBAL LIGATION      Prior to   Admission medications   Medication Sig Start Date End Date Taking? Authorizing Provider  ACCU-CHEK GUIDE test strip TEST 1 ONCE DAILY. 02/03/22   [provider]  atorvastatin (LIPITOR) 10 MG tablet Take 1 tablet by mouth daily. 02/03/22    [provider]  atorvastatin (LIPITOR) 20 MG tablet Take 20 mg by mouth every evening. 04/06/22   [provider]  baclofen (LIORESAL) 20 MG tablet TAKE ONE TABLET BY MOUTH THREE TIMES DAILY AND TAKE TWO TABLET BY MOUTH AT BEDTIME Patient taking differently: Take 20-60 mg by mouth See admin instructions. Take 20 mg in the morning and 60 mg at night. 03/22/22   Lomax, Amy, NP  Cholecalciferol (VITAMIN D3 PO) Take 5,000 Units by mouth daily.    [provider]  clotrimazole (LOTRIMIN) 1 % cream Apply to both feet and between toes twice daily for 4 weeks. 08/28/20   Freddie Breech, DPM  furosemide (LASIX) 20 MG tablet TAKE 1 TABLET BY MOUTH ONCE DAILY Patient taking differently: Take 20 mg by mouth daily. 10/20/19   Doristine Bosworth, MD  glipiZIDE (GLUCOTROL) 5 MG tablet Take 1 tablet (5 mg total) by mouth daily before lunch. Patient taking differently: Take 5 mg by mouth in the morning and at bedtime. 05/11/17   Doristine Bosworth, MD  hydrochlorothiazide (HYDRODIURIL) 25 MG tablet TAKE 1 TABLET BY MOUTH ONCE DAILY EVERY MORNING 04/21/20   Doristine Bosworth, MD  HYDROcodone-acetaminophen (NORCO) 10-325 MG tablet Take by mouth. 11/08/22 03/16/23  [provider]  levETIRAcetam (KEPPRA) 500 MG tablet Take 500 mg by mouth 2 (two) times daily. 02/14/22   [provider]  lisinopril (ZESTRIL) 2.5 MG tablet Take 1 tablet by mouth daily. 02/03/22   [provider]  metFORMIN (GLUCOPHAGE) 1000 MG tablet Take by mouth. 02/03/22   [provider]  NON FORMULARY Power wheelchair repairs  Diagnosis code z99.3 04/13/17   Doristine Bosworth, MD  NON FORMULARY Dispense catheter urine drainage bags 07/19/18   Doristine Bosworth, MD  Ostomy Supplies (NEW IMAGE SKIN/FLANGE/TAPE) MISC 1 application by Does not apply route daily as needed. 07/19/18   Doristine Bosworth, MD  potassium chloride (MICRO-K) 10 MEQ CR capsule Take 30 mEq by mouth 2 (two) times daily.  01/07/20    [provider]    Current Facility-Administered Medications  Medication Dose Route Frequency Provider Last Rate Last Admin   acetaminophen (TYLENOL) tablet 650 mg  650 mg Oral Q6H PRN Bobette Mo, MD       Or   acetaminophen (TYLENOL) suppository 650 mg  650 mg Rectal Q6H PRN Bobette Mo, MD       Chlorhexidine Gluconate Cloth 2 % PADS 6 each  6 each Topical Daily Glade Lloyd, MD   6 each at 03/13/23 0831   levETIRAcetam (KEPPRA) IVPB 500 mg/100 mL premix  500 mg Intravenous Q12H Bobette Mo, MD 400 mL/hr at 03/13/23 0804 500 mg at 03/13/23 0804   morphine (PF) 2 MG/ML injection 0.5-1 mg  0.5-1 mg Intravenous Q4H PRN Anthoney Harada, NP   1 mg at 03/13/23 0804   ondansetron (ZOFRAN) injection 4 mg  4 mg Intravenous Once Benjiman Core, MD       ondansetron [facility]) tablet 4 mg  4 mg Oral Q6H PRN Bobette Mo, MD       Or   ondansetron [facility]) injection 4 mg  4 mg Intravenous Q6H PRN Bobette Mo, MD  Allergies as of 03/12/2023 - Review Complete 03/12/2023  Allergen Reaction Noted   Ace inhibitors Other (See Comments) 05/01/2022   Lisinopril Other (See Comments) 05/01/2022    Family History  Problem Relation Age of Onset   Hypertension Mother    Breast cancer Mother        spread all over   Heart disease Father    Diabetes Father    Hypertension Sister    Hypertension Brother    Colon cancer Neg Hx    Esophageal cancer Neg Hx    Rectal cancer Neg Hx     Social History   Socioeconomic History   Marital status: Single    Spouse name: Not on file   Number of children: 3   Years of education: 14   Highest education level: Not on file  Occupational History   Occupation: Disabled  Tobacco Use   Smoking status: Never   Smokeless tobacco: Never  Vaping Use   Vaping Use: Never used  Substance and Sexual Activity   Alcohol use: No   Drug use: No   Sexual activity: Not on file  Other Topics Concern   Not on file   Social History Narrative   Right handed   Soda- 1 can per day   Social Determinants of Health   Financial Resource Strain: Not on file  Food Insecurity: Patient Declined (03/13/2023)   Hunger Vital Sign    Worried About Running Out of Food in the Last Year: Patient declined    Ran Out of Food in the Last Year: Patient declined  Transportation Needs: No Transportation Needs (03/13/2023)   PRAPARE - Administrator, Civil Service (Medical): No    Lack of Transportation (Non-Medical): No  Physical Activity: Not on file  Stress: Not on file  Social Connections: Not on file  Intimate Partner Violence: Not At Risk (03/13/2023)   Humiliation, Afraid, Rape, and Kick questionnaire    Fear of Current or Ex-Partner: No    Emotionally Abused: No    Physically Abused: No    Sexually Abused: No    Review of Systems: Gen: Denies any fever, chills, sweats, anorexia, fatigue, weakness, malaise, weight loss, and sleep disorder CV: Denies chest pain, angina, palpitations, syncope, orthopnea, PND, peripheral edema, and claudication. Resp: Denies dyspnea at rest, dyspnea with exercise, cough, sputum, wheezing, coughing up blood, and pleurisy. GI: Denies vomiting blood, jaundice, and fecal incontinence.   Denies dysphagia or odynophagia. GU : Denies urinary burning, blood in urine, urinary frequency, urinary hesitancy, nocturnal urination, and urinary incontinence. MS: Denies joint pain, limitation of movement, and swelling, stiffness, low back pain, extremity pain. Denies muscle weakness, cramps, atrophy.  Derm: Denies rash, itching, dry skin, hives, moles, warts, or unhealing ulcers.  Psych: Denies depression, anxiety, memory loss, suicidal ideation, hallucinations, paranoia, and confusion. Heme: Denies bruising, bleeding, and enlarged lymph nodes. Neuro:  Denies any headaches, dizziness, paresthesias. Endo:  Denies any problems with DM, thyroid, adrenal function.  Physical Exam: Vital  signs in last 24 hours: Temp:  [97.8 F (36.6 C)-99.9 F (37.7 C)] 98.9 F (37.2 C) (04/07 0534) Pulse Rate:  [91-104] 97 (04/07 0534) Resp:  [17-18] 18 (04/07 0534) BP: (116-158)/(57-123) 137/69 (04/07 0534) SpO2:  [95 %-100 %] 97 % (04/07 0534) Weight:  [129.7 kg] 129.7 kg (04/06 1519)    General:  Alert, well-developed, well-nourished, immobile, in NAD Head:  Normocephalic and atraumatic. Eyes:  Sclera clear, no icterus. Conjunctiva pink. Ears:  Normal auditory acuity.  Nose:  No deformity, discharge, or lesions. Mouth:  No deformity or lesions. Oropharynx pink & moist. Neck:  Supple; no masses or thyromegaly. Chest:  Clear throughout to auscultation. No wheezes, crackles, or rhonchi. No acute distress. Heart:  Regular rate and rhythm; no murmurs, clicks, rubs, or gallops. Abdomen:  Soft, nontender and nondistended. No masses, hepatosplenomegaly or hernias noted. Normal bowel sounds, without guarding, and without rebound.   Rectal:  Deferred, stool Hemoccults pending Msk:  Symmetrical without gross deformities. Normal posture. Pulses:  Normal pulses noted. Extremities:  Without clubbing or edema. Neurologic:  Alert and  oriented x4;   Skin:  Intact without significant lesions or rashes. Cervical Nodes:  No significant cervical adenopathy. Psych:  Alert and cooperative. Normal mood and affect.  Intake/Output from previous day: 04/06 0701 - 04/07 0700 In: 396.7 [Blood:241.7; IV Piggyback:155] Out: 450 [Urine:450] Intake/Output this shift: No intake/output data recorded.  Previous Endoscopies:  EGD Dr Marca AnconaKarki Dec 2018  Nonbleeding erosive gastropathy Nonbleeding small gastric antrum ulcers.  Biopsied. Normal duodenum.  Biopsied. Path: Chronic inactive gastritis, normal duodenum   Lab Results: Recent Labs    03/12/23 1552 03/13/23 0456  WBC 10.3 9.3  HGB 6.6* 7.4*  HCT 23.4* 25.2*  PLT 1,049* 967*   BMET Recent Labs    03/12/23 1552 03/13/23 0456  NA 133*  133*  K 4.0 3.7  CL 99 101  CO2 23 23  GLUCOSE 185* 173*  BUN 13 11  CREATININE 0.75 0.65  CALCIUM 8.7* 8.8*   LFT Recent Labs    03/13/23 0456  PROT 7.0  ALBUMIN 2.6*  AST 9*  ALT 20  ALKPHOS 76  BILITOT 0.6    Studies/Results: DG Chest Portable 1 View  Result Date: 03/12/2023 CLINICAL DATA:  dysphagia EXAM: PORTABLE CHEST 1 VIEW COMPARISON:  None Available. FINDINGS: Evaluation is limited by positioning. The cardiomediastinal silhouette is the upper limits of normal in contour. No pleural effusion. No pneumothorax. No acute pleuroparenchymal abnormality. IMPRESSION: 1. No acute cardiopulmonary abnormality. Electronically Signed   By: Meda KlinefelterStephanie  Peacock M.D.   On: 03/12/2023 16:22      LOS: 1 day   Juliya Magill T. Russella DarStark, MD  03/13/2023, 9:04 AM See Loretha StaplerAMION,  GI, to contact our on call provider

## 2023-03-13 NOTE — H&P (View-Only) (Signed)
Consult Note   Referring Provider: [facility] Primary Care Physician:  Porfirio Oar, PA Primary Gastroenterologist:  Dr. Amada Jupiter  Reason for Consultation: Dysphagia   Assessment    Dysphagia to solids and partially to liquids for 1 week.  History of GERD.  R/O esophagitis - infectious or reflux related, pill ulcer (Micro-K), stricture, neoplasm. History of gastric ulcers and erosive gastritis in 2018 Chronic constipation likely related to poor motility due to MS, immobility and opioids Microcytic anemia, R/O iron deficiency MS and multiple additional medical problems as outlined in HPI   Recommendations    Pantoprazole 40 mg IV twice daily for now EGD today. The risks (including bleeding, perforation, infection, missed lesions, medication reactions and possible hospitalization or surgery if complications occur), benefits, and alternatives to endoscopy with possible biopsy and possible dilation were discussed with the patient and they consent to proceed.   Fe, TIBC, ferritin - use pretransfusion blood specimen if possible    HPI: [name] is a 62 y.o. female with a past medical history of multiple sclerosis, morbid obesity, endometrial cancer status post hysterectomy and radiation therapy, GERD, chronic constipation, DM2, hyperlipidemia, hypertension, lower back/sacral ulcers, osteoarthritis, chronic anemia and thrombocytosis.  She presented to the ED relating inability to swallow solids and some of her medications.  She has limited ability to swallow liquids.  Symptoms began acutely 1 week ago.  She relates no recent medication changes or antibiotics. Denies weight loss, abdominal pain, constipation, diarrhea, change in stool caliber, melena, hematochezia, nausea, vomiting, chest pain.    Past Medical History:  Diagnosis Date   Anemia    history of   Arthritis    Decubitus ulcers    currently treated at wound center   Diabetes mellitus without complication     Diabetic polyneuropathy    Endometrial cancer    Grade 2   Fibroids    GERD (gastroesophageal reflux disease)    Heartburn    Occ   History of radiation therapy 03/20/2019   02/14/2019 - 03/20/2019 Pelvis / 45 Gy in 25 fractions  Dr Antony Blackbird   History of radiation therapy 04/11/2019   03/27/2019, 04/04/2019, 04/11/2019 (HDR, [facility]): Vaginal Cuff (cylinder, 3 cm diameter) / 18 Gy in 3 fractions (3 cm tx length) Dr Antony Blackbird   Hypertension    Mixed hyperlipidemia    Morbid obesity    MS (multiple sclerosis)     Past Surgical History:  Procedure Laterality Date   BRAIN TUMOR EXCISION  09/2020   COLONOSCOPY     CYSTOSCOPY N/A 01/22/2020   Procedure: CYSTOSCOPY WITH SUPRA PUBIC TUBE CHANGE, BOTOX 200 UNITS, RETROGRADE PYELOGRAM BILATERAL. FULGERATION .5 CM-2CM;  Surgeon: Jerilee Field, MD;  Location: [facility];  Service: Urology;  Laterality: N/A;   ESOPHAGOGASTRODUODENOSCOPY N/A 12/02/2017   Procedure: ESOPHAGOGASTRODUODENOSCOPY (EGD);  Surgeon: Kerin Salen, MD;  Location: [facility] END[facility]COPY;  Service: Gastroenterology;  Laterality: N/A;   IRRIGATION AND DEBRIDEMENT BUTTOCKS Right 02/2023   ROBOTIC ASSISTED TOTAL HYSTERECTOMY WITH BILATERAL SALPINGO OOPHERECTOMY Bilateral 12/19/2018   Procedure: XI ROBOTIC ASSISTED TOTAL HYSTERECTOMY (UTERUS GREATER THAN 250gr) WITH BILATERAL SALPINGO OOPHORECTOMY;  Surgeon: Adolphus Birchwood, MD;  Location: [facility];  Service: Gynecology;  Laterality: Bilateral;   SENTINEL NODE BIOPSY N/A 12/19/2018   Procedure: SENTINEL NODE BIOPSY;  Surgeon: Adolphus Birchwood, MD;  Location: [facility];  Service: Gynecology;  Laterality: N/A;   SUPRAPUBIC CATHETER INSERTION  2009   TONSILLECTOMY     TUBAL LIGATION      Prior to  Admission medications   Medication Sig Start Date End Date Taking? Authorizing Provider  ACCU-CHEK GUIDE test strip TEST 1 ONCE DAILY. 02/03/22   [provider]  atorvastatin (LIPITOR) 10 MG tablet Take 1 tablet by mouth daily. 02/03/22    [provider]  atorvastatin (LIPITOR) 20 MG tablet Take 20 mg by mouth every evening. 04/06/22   [provider]  baclofen (LIORESAL) 20 MG tablet TAKE ONE TABLET BY MOUTH THREE TIMES DAILY AND TAKE TWO TABLET BY MOUTH AT BEDTIME Patient taking differently: Take 20-60 mg by mouth See admin instructions. Take 20 mg in the morning and 60 mg at night. 03/22/22   Lomax, Amy, NP  Cholecalciferol (VITAMIN D3 PO) Take 5,000 Units by mouth daily.    [provider]  clotrimazole (LOTRIMIN) 1 % cream Apply to both feet and between toes twice daily for 4 weeks. 08/28/20   Freddie Breech, DPM  furosemide (LASIX) 20 MG tablet TAKE 1 TABLET BY MOUTH ONCE DAILY Patient taking differently: Take 20 mg by mouth daily. 10/20/19   Doristine Bosworth, MD  glipiZIDE (GLUCOTROL) 5 MG tablet Take 1 tablet (5 mg total) by mouth daily before lunch. Patient taking differently: Take 5 mg by mouth in the morning and at bedtime. 05/11/17   Doristine Bosworth, MD  hydrochlorothiazide (HYDRODIURIL) 25 MG tablet TAKE 1 TABLET BY MOUTH ONCE DAILY EVERY MORNING 04/21/20   Doristine Bosworth, MD  HYDROcodone-acetaminophen (NORCO) 10-325 MG tablet Take by mouth. 11/08/22 03/16/23  [provider]  levETIRAcetam (KEPPRA) 500 MG tablet Take 500 mg by mouth 2 (two) times daily. 02/14/22   [provider]  lisinopril (ZESTRIL) 2.5 MG tablet Take 1 tablet by mouth daily. 02/03/22   [provider]  metFORMIN (GLUCOPHAGE) 1000 MG tablet Take by mouth. 02/03/22   [provider]  NON FORMULARY Power wheelchair repairs  Diagnosis code z99.3 04/13/17   Doristine Bosworth, MD  NON FORMULARY Dispense catheter urine drainage bags 07/19/18   Doristine Bosworth, MD  Ostomy Supplies (NEW IMAGE SKIN/FLANGE/TAPE) MISC 1 application by Does not apply route daily as needed. 07/19/18   Doristine Bosworth, MD  potassium chloride (MICRO-K) 10 MEQ CR capsule Take 30 mEq by mouth 2 (two) times daily.  01/07/20    [provider]    Current Facility-Administered Medications  Medication Dose Route Frequency Provider Last Rate Last Admin   acetaminophen (TYLENOL) tablet 650 mg  650 mg Oral Q6H PRN Bobette Mo, MD       Or   acetaminophen (TYLENOL) suppository 650 mg  650 mg Rectal Q6H PRN Bobette Mo, MD       Chlorhexidine Gluconate Cloth 2 % PADS 6 each  6 each Topical Daily Glade Lloyd, MD   6 each at 03/13/23 0831   levETIRAcetam (KEPPRA) IVPB 500 mg/100 mL premix  500 mg Intravenous Q12H Bobette Mo, MD 400 mL/hr at 03/13/23 0804 500 mg at 03/13/23 0804   morphine (PF) 2 MG/ML injection 0.5-1 mg  0.5-1 mg Intravenous Q4H PRN Anthoney Harada, NP   1 mg at 03/13/23 0804   ondansetron (ZOFRAN) injection 4 mg  4 mg Intravenous Once Benjiman Core, MD       ondansetron [facility]) tablet 4 mg  4 mg Oral Q6H PRN Bobette Mo, MD       Or   ondansetron [facility]) injection 4 mg  4 mg Intravenous Q6H PRN Bobette Mo, MD  Allergies as of 03/12/2023 - Review Complete 03/12/2023  Allergen Reaction Noted   Ace inhibitors Other (See Comments) 05/01/2022   Lisinopril Other (See Comments) 05/01/2022    Family History  Problem Relation Age of Onset   Hypertension Mother    Breast cancer Mother        spread all over   Heart disease Father    Diabetes Father    Hypertension Sister    Hypertension Brother    Colon cancer Neg Hx    Esophageal cancer Neg Hx    Rectal cancer Neg Hx     Social History   Socioeconomic History   Marital status: Single    Spouse name: Not on file   Number of children: 3   Years of education: 14   Highest education level: Not on file  Occupational History   Occupation: Disabled  Tobacco Use   Smoking status: Never   Smokeless tobacco: Never  Vaping Use   Vaping Use: Never used  Substance and Sexual Activity   Alcohol use: No   Drug use: No   Sexual activity: Not on file  Other Topics Concern   Not on file   Social History Narrative   Right handed   Soda- 1 can per day   Social Determinants of Health   Financial Resource Strain: Not on file  Food Insecurity: Patient Declined (03/13/2023)   Hunger Vital Sign    Worried About Running Out of Food in the Last Year: Patient declined    Ran Out of Food in the Last Year: Patient declined  Transportation Needs: No Transportation Needs (03/13/2023)   PRAPARE - Administrator, Civil Service (Medical): No    Lack of Transportation (Non-Medical): No  Physical Activity: Not on file  Stress: Not on file  Social Connections: Not on file  Intimate Partner Violence: Not At Risk (03/13/2023)   Humiliation, Afraid, Rape, and Kick questionnaire    Fear of Current or Ex-Partner: No    Emotionally Abused: No    Physically Abused: No    Sexually Abused: No    Review of Systems: Gen: Denies any fever, chills, sweats, anorexia, fatigue, weakness, malaise, weight loss, and sleep disorder CV: Denies chest pain, angina, palpitations, syncope, orthopnea, PND, peripheral edema, and claudication. Resp: Denies dyspnea at rest, dyspnea with exercise, cough, sputum, wheezing, coughing up blood, and pleurisy. GI: Denies vomiting blood, jaundice, and fecal incontinence.   Denies dysphagia or odynophagia. GU : Denies urinary burning, blood in urine, urinary frequency, urinary hesitancy, nocturnal urination, and urinary incontinence. MS: Denies joint pain, limitation of movement, and swelling, stiffness, low back pain, extremity pain. Denies muscle weakness, cramps, atrophy.  Derm: Denies rash, itching, dry skin, hives, moles, warts, or unhealing ulcers.  Psych: Denies depression, anxiety, memory loss, suicidal ideation, hallucinations, paranoia, and confusion. Heme: Denies bruising, bleeding, and enlarged lymph nodes. Neuro:  Denies any headaches, dizziness, paresthesias. Endo:  Denies any problems with DM, thyroid, adrenal function.  Physical Exam: Vital  signs in last 24 hours: Temp:  [97.8 F (36.6 C)-99.9 F (37.7 C)] 98.9 F (37.2 C) (04/07 0534) Pulse Rate:  [91-104] 97 (04/07 0534) Resp:  [17-18] 18 (04/07 0534) BP: (116-158)/(57-123) 137/69 (04/07 0534) SpO2:  [95 %-100 %] 97 % (04/07 0534) Weight:  [129.7 kg] 129.7 kg (04/06 1519)    General:  Alert, well-developed, well-nourished, immobile, in NAD Head:  Normocephalic and atraumatic. Eyes:  Sclera clear, no icterus. Conjunctiva pink. Ears:  Normal auditory acuity.  Nose:  No deformity, discharge, or lesions. Mouth:  No deformity or lesions. Oropharynx pink & moist. Neck:  Supple; no masses or thyromegaly. Chest:  Clear throughout to auscultation. No wheezes, crackles, or rhonchi. No acute distress. Heart:  Regular rate and rhythm; no murmurs, clicks, rubs, or gallops. Abdomen:  Soft, nontender and nondistended. No masses, hepatosplenomegaly or hernias noted. Normal bowel sounds, without guarding, and without rebound.   Rectal:  Deferred, stool Hemoccults pending Msk:  Symmetrical without gross deformities. Normal posture. Pulses:  Normal pulses noted. Extremities:  Without clubbing or edema. Neurologic:  Alert and  oriented x4;   Skin:  Intact without significant lesions or rashes. Cervical Nodes:  No significant cervical adenopathy. Psych:  Alert and cooperative. Normal mood and affect.  Intake/Output from previous day: 04/06 0701 - 04/07 0700 In: 396.7 [Blood:241.7; IV Piggyback:155] Out: 450 [Urine:450] Intake/Output this shift: No intake/output data recorded.  Previous Endoscopies:  EGD Dr Marca AnconaKarki Dec 2018  Nonbleeding erosive gastropathy Nonbleeding small gastric antrum ulcers.  Biopsied. Normal duodenum.  Biopsied. Path: Chronic inactive gastritis, normal duodenum   Lab Results: Recent Labs    03/12/23 1552 03/13/23 0456  WBC 10.3 9.3  HGB 6.6* 7.4*  HCT 23.4* 25.2*  PLT 1,049* 967*   BMET Recent Labs    03/12/23 1552 03/13/23 0456  NA 133*  133*  K 4.0 3.7  CL 99 101  CO2 23 23  GLUCOSE 185* 173*  BUN 13 11  CREATININE 0.75 0.65  CALCIUM 8.7* 8.8*   LFT Recent Labs    03/13/23 0456  PROT 7.0  ALBUMIN 2.6*  AST 9*  ALT 20  ALKPHOS 76  BILITOT 0.6    Studies/Results: DG Chest Portable 1 View  Result Date: 03/12/2023 CLINICAL DATA:  dysphagia EXAM: PORTABLE CHEST 1 VIEW COMPARISON:  None Available. FINDINGS: Evaluation is limited by positioning. The cardiomediastinal silhouette is the upper limits of normal in contour. No pleural effusion. No pneumothorax. No acute pleuroparenchymal abnormality. IMPRESSION: 1. No acute cardiopulmonary abnormality. Electronically Signed   By: Meda KlinefelterStephanie  Peacock M.D.   On: 03/12/2023 16:22      LOS: 1 day   Cathe Bilger T. Russella DarStark, MD  03/13/2023, 9:04 AM See Loretha StaplerAMION,  GI, to contact our on call provider

## 2023-03-13 NOTE — Anesthesia Postprocedure Evaluation (Signed)
Anesthesia Post Note  Patient: Samyukta Orren  Procedure(s) Performed: ESOPHAGOGASTRODUODENOSCOPY (EGD) WITH PROPOFOL BIOPSY ESOPHAGEAL DILATION     Patient location during evaluation: PACU Anesthesia Type: MAC Level of consciousness: awake and alert and oriented Pain management: pain level controlled Vital Signs Assessment: post-procedure vital signs reviewed and stable Respiratory status: spontaneous breathing, nonlabored ventilation and respiratory function stable Cardiovascular status: stable and blood pressure returned to baseline Postop Assessment: no apparent nausea or vomiting Anesthetic complications: no   No notable events documented.  Last Vitals:  Vitals:   03/13/23 1100 03/13/23 1135  BP: 109/68 (!) 113/58  Pulse: 93 95  Resp: 16 15  Temp:    SpO2: 98% 98%    Last Pain:  Vitals:   03/13/23 1135  TempSrc:   PainSc: Asleep                 Khaidyn Staebell A.

## 2023-03-13 NOTE — Anesthesia Preprocedure Evaluation (Signed)
Anesthesia Evaluation  Patient identified by MRN, date of birth, ID band Patient awake    Reviewed: Allergy & Precautions, NPO status , Patient's Chart, lab work & pertinent test results  Airway Mallampati: II  TM Distance: >3 FB Neck ROM: Full    Dental no notable dental hx. (+) Teeth Intact   Pulmonary neg pulmonary ROS   Pulmonary exam normal breath sounds clear to auscultation       Cardiovascular hypertension, Pt. on medications Normal cardiovascular exam Rhythm:Regular Rate:Normal     Neuro/Psych Diabetic neuropathy Multiple sclerosis  Neuromuscular disease  negative psych ROS   GI/Hepatic Neg liver ROS,GERD  Medicated,,Dysphagia   Endo/Other  diabetes, Well Controlled, Type 2, Oral Hypoglycemic Agents  Morbid obesityHyperlipidemia  Renal/GU negative Renal ROS   Neurogenic blader Incontinence    Musculoskeletal  (+) Arthritis , Osteoarthritis,  Decubitus ulcers    Abdominal  (+) + obese  Peds  Hematology  (+) Blood dyscrasia, anemia Thrombocytosis   Anesthesia Other Findings   Reproductive/Obstetrics Hx/o endometrial Ca S/P RT                              Anesthesia Physical Anesthesia Plan  ASA: 3  Anesthesia Plan: MAC   Post-op Pain Management:    Induction: Intravenous  PONV Risk Score and Plan: 2 and Propofol infusion  Airway Management Planned: Natural Airway and Nasal Cannula  Additional Equipment:   Intra-op Plan:   Post-operative Plan:   Informed Consent: I have reviewed the patients History and Physical, chart, labs and discussed the procedure including the risks, benefits and alternatives for the proposed anesthesia with the patient or authorized representative who has indicated his/her understanding and acceptance.     Dental advisory given  Plan Discussed with: CRNA and Anesthesiologist  Anesthesia Plan Comments:          Anesthesia  Quick Evaluation

## 2023-03-13 NOTE — Anesthesia Procedure Notes (Signed)
Procedure Name: MAC Date/Time: 03/13/2023 11:03 AM  Performed by: Vanessa [place], CRNAPre-anesthesia Checklist: Patient identified, Emergency Drugs available, Suction available and Patient being monitored Patient Re-evaluated:Patient Re-evaluated prior to induction Oxygen Delivery Method: Simple face mask

## 2023-03-13 NOTE — Progress Notes (Signed)
PROGRESS NOTE    [name]  ENI:[id number] DOB: 06-23-1961 DOA: 03/12/2023 PCP: Porfirio Oar, PA   Brief Narrative:  62 y.o. female with medical history significant of multiple sclerosis, morbid obesity, mixed hyperlipidemia, hypertension, endometrial cancer, history of hysterectomy and radiation therapy, GERD, fibroids, type 2 diabetes with diabetic polyneuropathy, class III obesity/morbid obesity, chronic sacral ulcers who follows up at wound care center, chronic anemia, thrombocytosis, slow transit constipation presented with worsening dysphagia with inability to swallow solid foods.  GI was consulted.  Assessment & Plan:   Dysphagia -Progressively worsening dysphagia to solid foods.  NPO.  Awaiting GI evaluation.  Thrombocytosis -Possibly reactive.  Monitor intermittently  Microcytic anemia -Hemoglobin 6.6 presentation.  Status post 1 unit packed red cells transfusion.  Hemoglobin 7.4 this morning.  Transfuse if hemoglobin is less than 7.  Monitor H&H  Hyponatremia -Mild.  Monitor  History of multiple sclerosis with diplegia of lower extremities and spasticity -Follows up with neurology as an outpatient.  Cannot walk at baseline and uses a wheelchair.  Continue Keppra.  Outpatient follow-up with neurology. -OT eval.  Resume baclofen once able to take orally  Essential hypertension -Monitor blood pressure.  Oral antihypertensives on hold.  Morbid obesity Outpatient follow-up  Diabetes mellitus type 2 with diabetic polyneuropathy -Continue CBGs monitoring along with SSI  Hyperlipidemia -Statin on hold for now  Stage III sacral ulcer: Present on admission -Has chronic sacral wound for which patient follows up with wound care as an outpatient.  Consult wound care.   DVT prophylaxis: SCDs Code Status: Full Family Communication: None at bedside Disposition Plan: Status is: Inpatient Remains inpatient appropriate because: Of severity of illness    Consultants:  GI  Procedures: None  Antimicrobials: None   Subjective: Patient seen and examined at bedside.  No fever, vomiting, worsening shortness of breath reported.  Objective: Vitals:   03/12/23 2139 03/12/23 2351 03/13/23 0048 03/13/23 0534  BP: (!) 136/93 126/60 (!) 141/57 137/69  Pulse: (!) 103 98 94 97  Resp:  18 18 18   Temp: 99.1 F (37.3 C) 99.9 F (37.7 C) 99.5 F (37.5 C) 98.9 F (37.2 C)  TempSrc: Oral Oral Oral Oral  SpO2: 100% 100% 97% 97%  Weight:      Height:        Intake/Output Summary (Last 24 hours) at 03/13/2023 0958 Last data filed at 03/13/2023 6144 Gross per 24 hour  Intake 396.67 ml  Output 450 ml  Net -53.33 ml   Filed Weights   03/12/23 1519  Weight: 129.7 kg    Examination:  General exam: Appears calm and comfortable.  Chronically ill and deconditioned.  Currently on room air. Respiratory system: Bilateral decreased breath sounds at bases, no wheezing Cardiovascular system: S1 & S2 heard, Rate controlled Gastrointestinal system: Abdomen is morbidly obese, nondistended, soft and nontender. Normal bowel sounds heard. Extremities: No cyanosis, clubbing; trace lower extremity edema Central nervous system: Alert and oriented.  Slow to respond.  Bilateral lower extremity weakness present.   Skin: No obvious rashes/ecchymosis  psychiatry: Flat affect.  Not agitated.    Data Reviewed: I have personally reviewed following labs and imaging studies  CBC: Recent Labs  Lab 03/12/23 1552 03/13/23 0456  WBC 10.3 9.3  NEUTROABS 7.5  --   HGB 6.6* 7.4*  HCT 23.4* 25.2*  MCV 76.2* 77.5*  PLT 1,049* 967*   Basic Metabolic Panel: Recent Labs  Lab 03/12/23 1552 03/13/23 0456  NA 133* 133*  K 4.0 3.7  CL 99  101  CO2 23 23  GLUCOSE 185* 173*  BUN 13 11  CREATININE 0.75 0.65  CALCIUM 8.7* 8.8*   GFR: Estimated Creatinine Clearance: 100.9 mL/min (by C-G formula based on SCr of 0.65 mg/dL). Liver Function Tests: Recent Labs  Lab 03/12/23 1552  03/13/23 0456  AST 13* 9*  ALT 25 20  ALKPHOS 80 76  BILITOT 0.5 0.6  PROT 7.2 7.0  ALBUMIN 2.6* 2.6*   No results for input(s): "LIPASE", "AMYLASE" in the last 168 hours. No results for input(s): "AMMONIA" in the last 168 hours. Coagulation Profile: No results for input(s): "INR", "PROTIME" in the last 168 hours. Cardiac Enzymes: No results for input(s): "CKTOTAL", "CKMB", "CKMBINDEX", "TROPONINI" in the last 168 hours. BNP (last 3 results) No results for input(s): "PROBNP" in the last 8760 hours. HbA1C: No results for input(s): "HGBA1C" in the last 72 hours. CBG: Recent Labs  Lab 03/12/23 2122 03/12/23 2353 03/13/23 0531  GLUCAP 174* 177* 185*   Lipid Profile: No results for input(s): "CHOL", "HDL", "LDLCALC", "TRIG", "CHOLHDL", "LDLDIRECT" in the last 72 hours. Thyroid Function Tests: No results for input(s): "TSH", "T4TOTAL", "FREET4", "T3FREE", "THYROIDAB" in the last 72 hours. Anemia Panel: No results for input(s): "VITAMINB12", "FOLATE", "FERRITIN", "TIBC", "IRON", "RETICCTPCT" in the last 72 hours. Sepsis Labs: No results for input(s): "PROCALCITON", "LATICACIDVEN" in the last 168 hours.  No results found for this or any previous visit (from the past 240 hour(s)).       Radiology Studies: DG Chest Portable 1 View  Result Date: 03/12/2023 CLINICAL DATA:  dysphagia EXAM: PORTABLE CHEST 1 VIEW COMPARISON:  None Available. FINDINGS: Evaluation is limited by positioning. The cardiomediastinal silhouette is the upper limits of normal in contour. No pleural effusion. No pneumothorax. No acute pleuroparenchymal abnormality. IMPRESSION: 1. No acute cardiopulmonary abnormality. Electronically Signed   By: Meda KlinefelterStephanie  Peacock M.D.   On: 03/12/2023 16:22        Scheduled Meds:  Chlorhexidine Gluconate Cloth  6 each Topical Daily   ondansetron (ZOFRAN) IV  4 mg Intravenous Once   Continuous Infusions:  levETIRAcetam 500 mg (03/13/23 0804)          Glade LloydKshitiz  Delmy Holdren, MD [facility] 03/13/2023, 9:58 AM

## 2023-03-13 NOTE — Op Note (Addendum)
[facility] Patient Name: [name] Procedure Date: 03/13/2023 MRN: [id number] Attending MD: Meryl Dare , MD, [phone] Date of Birth: [birth year] CSN: [id number] Age: 62 Admit Type: Inpatient Procedure:                Upper GI endoscopy Indications:              Dysphagia Providers:                Venita Lick. Russella Dar, MD, Pollie Friar RN, RN, Harrington Challenger, Technician Referring MD:             [facility] Medicines:                Monitored Anesthesia Care Complications:            No immediate complications. Estimated Blood Loss:     Estimated blood loss was minimal. Procedure:                Pre-Anesthesia Assessment:                           - Prior to the procedure, a History and Physical                            was performed, and patient medications and                            allergies were reviewed. The patient's tolerance of                            previous anesthesia was also reviewed. The risks                            and benefits of the procedure and the sedation                            options and risks were discussed with the patient.                            All questions were answered, and informed consent                            was obtained. Prior Anticoagulants: The patient has                            taken no anticoagulant or antiplatelet agents. ASA                            Grade Assessment: III - A patient with severe                            systemic disease. After reviewing the risks and  benefits, the patient was deemed in satisfactory                            condition to undergo the procedure.                           After obtaining informed consent, the endoscope was                            passed under direct vision. Throughout the                            procedure, the patient's blood pressure, pulse, and                            oxygen saturations were  monitored continuously. The                            [id number]-H190 ([id number]) Olympus endoscope was introduced                            through the mouth, and advanced to the second part                            of duodenum. The upper GI endoscopy was                            accomplished without difficulty. The patient                            tolerated the procedure well. EGD was performed in                            the supine position. Scope In: Scope Out: Findings:      One benign-appearing, intrinsic moderate stenosis was found 20 cm from       the incisors. This stenosis measured 9 mm (inner diameter) x less than       one cm (in length). The stenosis was traversed. A guidewire was placed       and the scope was withdrawn. Dilations were performed with Savary       dilators with mild resistance at 14 mm and 16 mm. No heme on dilators.      A non-bleeding diverticulum with a moderate-sized opening and no       stigmata of recent bleeding was found at the upper esophageal sphincter.      Localized, white plaques were found in the mid esophagus. Biopsies were       taken with a cold forceps for histology.      The exam of the esophagus was otherwise normal.      A small hiatal hernia was present.      The exam of the stomach was otherwise normal.      The duodenal bulb and second portion of the duodenum were normal. Impression:               - Benign-appearing proximal esophageal stenosis -  stricture/web. Dilated.                           - Zenker's diverticulum at the upper esophageal                            sphincter.                           - Esophageal plaques were found, consistent with                            candidiasis. Biopsied.                           - Small hiatal hernia.                           - Normal duodenal bulb and second portion of the                            duodenum. Moderate Sedation:      Not Applicable -  Patient had care per Anesthesia. Recommendation:           - Return patient to hospital ward for ongoing care.                           - Clear liquid diet today, advance as tolerated to                            soft diet tomorrow.                           - Continue present medications.                           - Diflucan 100 mg qd for 1 week.                           - Barium esophagram to further evaluate Zenker's.                           - Consider ENT consult for Zenker's after                            esophagram.                           - Await pathology results.                           - Outpatient GI follow-up with Dr. Myrtie Neitheranis Procedure Code(s):        --- Professional ---                           [phone], Esophagogastroduodenoscopy, flexible,  transoral; with insertion of guide wire followed by                            passage of dilator(s) through esophagus over guide                            wire                           43239, 59, Esophagogastroduodenoscopy, flexible,                            transoral; with biopsy, single or multiple Diagnosis Code(s):        --- Professional ---                           K22.2, Esophageal obstruction                           Q39.6, Congenital diverticulum of esophagus                           K22.9, Disease of esophagus, unspecified                           K44.9, Diaphragmatic hernia without obstruction or                            gangrene                           R13.10, Dysphagia, unspecified CPT copyright 2022 American Medical Association. All rights reserved. The codes documented in this report are preliminary and upon coder review may  be revised to meet current compliance requirements. Meryl Dare, MD 03/13/2023 11:26:29 AM This report has been signed electronically. Number of Addenda: 0

## 2023-03-13 NOTE — Transfer of Care (Signed)
Immediate Anesthesia Transfer of Care Note  Patient: [name]  Procedure(s) Performed: ESOPHAGOGASTRODUODENOSCOPY (EGD) WITH PROPOFOL BIOPSY ESOPHAGEAL DILATION  Patient Location: Endoscopy Unit  Anesthesia Type:MAC  Level of Consciousness: awake and patient cooperative  Airway & Oxygen Therapy: Patient Spontanous Breathing and Patient connected to face mask  Post-op Assessment: Report given to RN and Post -op Vital signs reviewed and stable  Post vital signs: Reviewed and stable  Last Vitals:  Vitals Value Taken Time  BP    Temp    Pulse    Resp    SpO2      Last Pain:  Vitals:   03/13/23 1049  TempSrc: Temporal  PainSc: 5          Complications: No notable events documented.

## 2023-03-13 NOTE — Progress Notes (Signed)
Messaged on call provider- J. Garner Nash, NP and informed him of dressing that was applied during first shift, reported as being NS wet to dry packing to wounds. He stated it was okay to continue with doing this until wound care see the patient tomorrow.

## 2023-03-13 NOTE — Interval H&P Note (Signed)
History and Physical Interval Note:  03/13/2023 11:00 AM  [name]  has presented today for surgery, with the diagnosis of dysphagia.  The various methods of treatment have been discussed with the patient and family. After consideration of risks, benefits and other options for treatment, the patient has consented to  Procedure(s): ESOPHAGOGASTRODUODENOSCOPY (EGD) WITH PROPOFOL (N/A) as a surgical intervention.  The patient's history has been reviewed, patient examined, no change in status, stable for surgery.  I have reviewed the patient's chart and labs.  Questions were answered to the patient's satisfaction.     Venita Lick. Russella Dar

## 2023-03-14 ENCOUNTER — Telehealth: Payer: Self-pay

## 2023-03-14 DIAGNOSIS — K222 Esophageal obstruction: Secondary | ICD-10-CM | POA: Diagnosis not present

## 2023-03-14 DIAGNOSIS — D509 Iron deficiency anemia, unspecified: Secondary | ICD-10-CM | POA: Diagnosis not present

## 2023-03-14 DIAGNOSIS — M1990 Unspecified osteoarthritis, unspecified site: Secondary | ICD-10-CM

## 2023-03-14 DIAGNOSIS — R1310 Dysphagia, unspecified: Secondary | ICD-10-CM | POA: Diagnosis not present

## 2023-03-14 DIAGNOSIS — G35 Multiple sclerosis: Secondary | ICD-10-CM | POA: Diagnosis not present

## 2023-03-14 LAB — GLUCOSE, CAPILLARY
Glucose-Capillary: 172 mg/dL — ABNORMAL HIGH (ref 70–99)
Glucose-Capillary: 180 mg/dL — ABNORMAL HIGH (ref 70–99)
Glucose-Capillary: 186 mg/dL — ABNORMAL HIGH (ref 70–99)

## 2023-03-14 LAB — CBC WITH DIFFERENTIAL/PLATELET
Abs Immature Granulocytes: 0.02 10*3/uL (ref 0.00–0.07)
Basophils Absolute: 0 10*3/uL (ref 0.0–0.1)
Basophils Relative: 0 %
Eosinophils Absolute: 0.3 10*3/uL (ref 0.0–0.5)
Eosinophils Relative: 5 %
HCT: 25.3 % — ABNORMAL LOW (ref 36.0–46.0)
Hemoglobin: 7.3 g/dL — ABNORMAL LOW (ref 12.0–15.0)
Immature Granulocytes: 0 %
Lymphocytes Relative: 18 %
Lymphs Abs: 1.3 10*3/uL (ref 0.7–4.0)
MCH: 22.7 pg — ABNORMAL LOW (ref 26.0–34.0)
MCHC: 28.9 g/dL — ABNORMAL LOW (ref 30.0–36.0)
MCV: 78.6 fL — ABNORMAL LOW (ref 80.0–100.0)
Monocytes Absolute: 0.7 10*3/uL (ref 0.1–1.0)
Monocytes Relative: 10 %
Neutro Abs: 4.6 10*3/uL (ref 1.7–7.7)
Neutrophils Relative %: 67 %
Platelets: 851 10*3/uL — ABNORMAL HIGH (ref 150–400)
RBC: 3.22 MIL/uL — ABNORMAL LOW (ref 3.87–5.11)
RDW: 18.5 % — ABNORMAL HIGH (ref 11.5–15.5)
WBC: 7 10*3/uL (ref 4.0–10.5)
nRBC: 0 % (ref 0.0–0.2)

## 2023-03-14 LAB — BASIC METABOLIC PANEL
Anion gap: 8 (ref 5–15)
BUN: 7 mg/dL — ABNORMAL LOW (ref 8–23)
CO2: 24 mmol/L (ref 22–32)
Calcium: 8.6 mg/dL — ABNORMAL LOW (ref 8.9–10.3)
Chloride: 103 mmol/L (ref 98–111)
Creatinine, Ser: 0.65 mg/dL (ref 0.44–1.00)
GFR, Estimated: >60 mL/min (ref >60)
Glucose, Bld: 177 mg/dL — ABNORMAL HIGH (ref 70–99)
Potassium: 3.7 mmol/L (ref 3.5–5.1)
Sodium: 135 mmol/L (ref 135–145)

## 2023-03-14 LAB — MAGNESIUM: Magnesium: 1.6 mg/dL — ABNORMAL LOW (ref 1.7–2.4)

## 2023-03-14 MED ORDER — FLUCONAZOLE 200 MG PO TABS: 200.0000 mg | ORAL_TABLET | Freq: Every day | ORAL | 0 refills | Status: AC

## 2023-03-14 MED ORDER — PANTOPRAZOLE SODIUM 40 MG PO TBEC: 40.0000 mg | DELAYED_RELEASE_TABLET | Freq: Every day | ORAL | 0 refills | Status: AC

## 2023-03-14 MED ORDER — BACLOFEN 20 MG PO TABS: 20.0000 mg | ORAL_TABLET | ORAL | Status: DC

## 2023-03-14 MED ORDER — MAGNESIUM SULFATE 2 GM/50ML IV SOLN
2.0000 g | Freq: Once | INTRAVENOUS | Status: AC
  Administered 2023-03-14: 2 g via INTRAVENOUS
  Filled 2023-03-14: qty 50

## 2023-03-14 NOTE — Consult Note (Addendum)
WOC Nurse Consult Note: Reason for Consult: Consult requested for right buttock.  Pt is planning to discharge today and states she is followed by the outpatient wound care center.  She has a home health aid who is planning to perform her bath and dressing change today and is familiar with her plan of care. She recently had a wound assessment performed at the outpatient wound canter on 3/27 and had sharp debridement performed, so I did not assess the wound today. Pt is well-informed regarding the plan of care.  She has Dakins dressings performed at home. Pt plans to discharge today so I will not order Dakins, but Aquacel, which is a dressing the patient was using previously.  She is on a low airloss mattress to reduce pressure.  According to the wound care center notes; wound is a chronic Stage 3 pressure injury, 18X15X5cm with some necrotic tissue. It has been present 20 years and there is minimal healing; there are multiple systemic factors which can impair healing. A colostomy has been recommended to keep the affected area from becoming soiled, but the patient has declined.  Pressure Injury POA: Yes Topical treatment orders provided for bedside nurses to perform as follows: Pack right buttock Q day with Aquacel, using swab to fill, then cover with ABD pads and tape. Pt plans to follow-up with the outpatient wound care center on Wed. Please re-consult if further assistance is needed.  Thank-you,  Cammie Mcgee MSN, RN, CWOCN, [place], CNS [phone]

## 2023-03-14 NOTE — Telephone Encounter (Signed)
-----   Message from Sammuel Cooper, PA-C sent at 03/14/2023 11:20 AM EDT ----- Regarding: office follow up This patient was hospitalized over the weekend, with acute dysphagia, had EGD with esophageal stricture and mild candidiasis Being discharged home today  Please call her at some point this week with a follow-up appointment with Dr. Myrtie Neither  6 to 8 weeks out is okay- thank you Plan afternoon appointment, she has very limited mobility

## 2023-03-14 NOTE — Discharge Summary (Signed)
Physician Discharge Summary  [name] ZOX:[id number] DOB: [birth year] DOA: 03/12/2023  PCP: Porfirio Oar, PA  Admit date: 03/12/2023 Discharge date: 03/14/2023  Admitted From: Home Disposition: Home  Recommendations for Outpatient Follow-up:  Follow up with PCP in 1 week  Outpatient follow-up with GI  Outpatient follow-up with neurology  follow up in ED if symptoms worsen or new appear   Home Health: No Equipment/Devices: None  Discharge Condition: Stable CODE STATUS: Full Diet recommendation: Heart healthy/carb modified/soft diet  Brief/Interim Summary: 62 y.o. female with medical history significant of multiple sclerosis, morbid obesity, mixed hyperlipidemia, hypertension, endometrial cancer, history of hysterectomy and radiation therapy, GERD, fibroids, type 2 diabetes with diabetic polyneuropathy, class III obesity/morbid obesity, chronic sacral ulcers who follows up at wound care center, chronic anemia, thrombocytosis, slow transit constipation presented with worsening dysphagia with inability to swallow solid foods.  GI was consulted.  She underwent EGD on 03/13/2023 which showed proximal esophageal stenosis which was dilated; Zenker's diverticulum at the upper esophageal sphincter and esophageal plaques consistent with candidiasis.  She was started on Diflucan.  Patient subsequently is feeling better and tolerating diet.  She wants to go home today.  GI has cleared the patient for discharge.  Discharge patient home today with outpatient follow-up with GI.  Discharge Diagnoses:   Dysphagia Proximal esophageal stenosis s/p dilatation Gencardia diverticulum at the upper esophageal sphincter Possible esophageal candidiasis -Presented with progressively worsening dysphagia to solid foods.  -She underwent EGD on 03/13/2023 which showed proximal esophageal stenosis which was dilated; Zenker's diverticulum at the upper esophageal sphincter and esophageal plaques consistent with candidiasis.   She was started on Diflucan.  Patient subsequently is feeling better and tolerating diet.  She wants to go home today.  GI has cleared the patient for discharge.  Discharge patient home today with outpatient follow-up with GI. -GI also recommended barium esophagram and ENT consultation to evaluate for Zenker's diverticulum: These can happen as an outpatient.    Thrombocytosis -Possibly reactive.  Monitor intermittently   Microcytic anemia -Hemoglobin 6.6 presentation.  Status post 1 unit packed red cells transfusion.  Hemoglobin 7.3 this morning.  Outpatient follow-up.   Hyponatremia -Improved  History of multiple sclerosis with diplegia of lower extremities and spasticity -Follows up with neurology as an outpatient.  Cannot walk at baseline and uses a wheelchair.  Continue Keppra.  Outpatient follow-up with neurology. -Resume baclofen    Essential hypertension -Monitor blood pressure.  Resume home oral regimen.  Outpatient follow-up.  Morbid obesity -Outpatient follow-up   Diabetes mellitus type 2 with diabetic polyneuropathy -Continue carb modified diet and home regimen.  Hyperlipidemia -Resume statin  Stage III sacral ulcer: Present on admission -Has chronic sacral wound for which patient follows up with wound care as an outpatient.  Wound care consultation pending.  Outpatient follow-up with wound care.  Continue wound care as per outpatient wound care recommendations.   Discharge Instructions  Discharge Instructions     Ambulatory referral to Gastroenterology   Complete by: As directed    Hospital followup   What is the reason for referral?: Other   Diet - low sodium heart healthy   Complete by: As directed    Diet Carb Modified   Complete by: As directed    Soft diet   Discharge wound care:   Complete by: As directed    As per outpatient wound care recommendations   Increase activity slowly   Complete by: As directed       Allergies as of  03/14/2023        Reactions   Ace Inhibitors Swelling, Other (See Comments)   Angioedema   Lisinopril Swelling, Other (See Comments)   Angioedema        Medication List     TAKE these medications    Accu-Chek Guide test strip Generic drug: glucose blood TEST 1 ONCE DAILY.   atorvastatin 20 MG tablet Commonly known as: LIPITOR Take 20 mg by mouth at bedtime.   baclofen 20 MG tablet Commonly known as: LIORESAL Take 1-2 tablets (20-40 mg total) by mouth See admin instructions. Take 20 mg by mouth once a day and 40 mg at bedtime   clotrimazole 1 % cream Commonly known as: LOTRIMIN Apply to both feet and between toes twice daily for 4 weeks.   fluconazole 200 MG tablet Commonly known as: Diflucan Take 1 tablet (200 mg total) by mouth daily for 7 days.   furosemide 20 MG tablet Commonly known as: LASIX TAKE 1 TABLET BY MOUTH ONCE DAILY What changed: when to take this   glipiZIDE 5 MG tablet Commonly known as: GLUCOTROL Take 1 tablet (5 mg total) by mouth daily before lunch. What changed: when to take this   hydrochlorothiazide 25 MG tablet Commonly known as: HYDRODIURIL TAKE 1 TABLET BY MOUTH ONCE DAILY EVERY MORNING What changed: when to take this   levETIRAcetam 500 MG tablet Commonly known as: KEPPRA Take 500 mg by mouth 2 (two) times daily.   lisinopril 2.5 MG tablet Commonly known as: ZESTRIL Take 2.5 mg by mouth daily.   metFORMIN 1000 MG tablet Commonly known as: GLUCOPHAGE Take 1,000 mg by mouth 2 (two) times daily with a meal.   New Image Skin/Flange/Tape Misc 1 application by Does not apply route daily as needed.   NON FORMULARY Power wheelchair repairs  Diagnosis code z99.3   NON FORMULARY Dispense catheter urine drainage bags   pantoprazole 40 MG tablet Commonly known as: Protonix Take 1 tablet (40 mg total) by mouth daily.   Percocet 10-325 MG tablet Generic drug: oxyCODONE-acetaminophen Take 1 tablet by mouth every 8 (eight) hours as needed for  pain.   potassium chloride 10 MEQ CR capsule Commonly known as: MICRO-K Take 30 mEq by mouth 2 (two) times daily.   prochlorperazine 10 MG tablet Commonly known as: COMPAZINE Take 10 mg by mouth 2 (two) times daily as needed for nausea or vomiting.   sodium hypochlorite external solution Commonly known as: DAKIN'S 1/2 STRENGTH Irrigate with 1 Application as directed See admin instructions. APPLY TOPICALLY, THEN LIGHTLY MOISTEN GAUZE AND APPLY TO WOUND AT BEDTIME DAILY   VITAMIN D3 PO Take 5,000 Units by mouth daily.               Discharge Care Instructions  (From admission, onward)           Start     Ordered   03/14/23 0000  Discharge wound care:       Comments: As per outpatient wound care recommendations   03/14/23 0956            Follow-up Information     Porfirio Oar, PA. Schedule an appointment as soon as possible for a visit in 1 week(s).   Specialty: Family Medicine Contact information: [address] [place] [address] [phone]                Allergies  Allergen Reactions   Ace Inhibitors Swelling and Other (See Comments)  Angioedema    Lisinopril Swelling and Other (See Comments)    Angioedema     Consultations: GI   Procedures/Studies: DG Chest Portable 1 View  Result Date: 03/12/2023 CLINICAL DATA:  dysphagia EXAM: PORTABLE CHEST 1 VIEW COMPARISON:  None Available. FINDINGS: Evaluation is limited by positioning. The cardiomediastinal silhouette is the upper limits of normal in contour. No pleural effusion. No pneumothorax. No acute pleuroparenchymal abnormality. IMPRESSION: 1. No acute cardiopulmonary abnormality. Electronically Signed   By: Meda Klinefelter M.D.   On: 03/12/2023 16:22    EGD on 03/13/2023 Impression:               - Benign-appearing proximal esophageal stenosis -                            stricture/web. Dilated.                           - Zenker's diverticulum at the upper  esophageal                            sphincter.                           - Esophageal plaques were found, consistent with                            candidiasis. Biopsied.                           - Small hiatal hernia.                           - Normal duodenal bulb and second portion of the                            duodenum. Moderate Sedation:      Not Applicable - Patient had care per Anesthesia. Recommendation:           - Return patient to hospital ward for ongoing care.                           - Clear liquid diet today, advance as tolerated to                            soft diet tomorrow.                           - Continue present medications.                           - Diflucan 100 mg qd for 1 week.                           - Barium esophagram to further evaluate Zenker's.                           - Consider ENT consult for Zenker's after  esophagram.                           - Await pathology results.                           - Outpatient GI follow-up with Dr. Myrtie Neither  Subjective: Patient seen and examined at bedside.  Feels much better and wants to go home today.  Denies any fever, nausea or vomiting.  Discharge Exam: Vitals:   03/14/23 0014 03/14/23 0644  BP: (!) 153/51 (!) 138/58  Pulse: 90 93  Resp: 18 18  Temp: 98.6 F (37 C) 98.6 F (37 C)  SpO2: 100% 96%    General: Pt is alert, awake, not in acute distress.  Looks chronically ill and deconditioned.  On room air. Cardiovascular: rate controlled, S1/S2 + Respiratory: bilateral decreased breath sounds at bases Abdominal: Soft, morbidly obese, NT, ND, bowel sounds + Extremities: Mild lower extremity edema present; no cyanosis    The results of significant diagnostics from this hospitalization (including imaging, microbiology, ancillary and laboratory) are listed below for reference.     Microbiology: No results found for this or any previous visit (from the past 240  hour(s)).   Labs: BNP (last 3 results) No results for input(s): "BNP" in the last 8760 hours. Basic Metabolic Panel: Recent Labs  Lab 03/12/23 1552 03/13/23 0456 03/14/23 0405  NA 133* 133* 135  K 4.0 3.7 3.7  CL 99 101 103  CO2 23 23 24   GLUCOSE 185* 173* 177*  BUN 13 11 7*  CREATININE 0.75 0.65 0.65  CALCIUM 8.7* 8.8* 8.6*  MG  --   --  1.6*   Liver Function Tests: Recent Labs  Lab 03/12/23 1552 03/13/23 0456  AST 13* 9*  ALT 25 20  ALKPHOS 80 76  BILITOT 0.5 0.6  PROT 7.2 7.0  ALBUMIN 2.6* 2.6*   No results for input(s): "LIPASE", "AMYLASE" in the last 168 hours. No results for input(s): "AMMONIA" in the last 168 hours. CBC: Recent Labs  Lab 03/12/23 1552 03/13/23 0456 03/14/23 0405  WBC 10.3 9.3 7.0  NEUTROABS 7.5  --  4.6  HGB 6.6* 7.4* 7.3*  HCT 23.4* 25.2* 25.3*  MCV 76.2* 77.5* 78.6*  PLT 1,049* 967* 851*   Cardiac Enzymes: No results for input(s): "CKTOTAL", "CKMB", "CKMBINDEX", "TROPONINI" in the last 168 hours. BNP: Invalid input(s): "POCBNP" CBG: Recent Labs  Lab 03/13/23 0531 03/13/23 1214 03/13/23 1749 03/14/23 0005 03/14/23 0637  GLUCAP 185* 168* 185* 186* 172*   D-Dimer No results for input(s): "DDIMER" in the last 72 hours. Hgb A1c No results for input(s): "HGBA1C" in the last 72 hours. Lipid Profile No results for input(s): "CHOL", "HDL", "LDLCALC", "TRIG", "CHOLHDL", "LDLDIRECT" in the last 72 hours. Thyroid function studies No results for input(s): "TSH", "T4TOTAL", "T3FREE", "THYROIDAB" in the last 72 hours.  Invalid input(s): "FREET3" Anemia work up No results for input(s): "VITAMINB12", "FOLATE", "FERRITIN", "TIBC", "IRON", "RETICCTPCT" in the last 72 hours. Urinalysis    Component Value Date/Time   COLORURINE AMBER (A) 12/12/2018 1536   APPEARANCEUR CLOUDY (A) 12/12/2018 1536   LABSPEC 1.023 12/12/2018 1536   PHURINE 8.0 12/12/2018 1536   GLUCOSEU NEGATIVE 12/12/2018 1536   HGBUR SMALL (A) 12/12/2018 1536    BILIRUBINUR NEGATIVE 12/12/2018 1536   BILIRUBINUR negative 03/04/2017 1837   KETONESUR 5 (A) 12/12/2018 1536   PROTEINUR 100 (A) 12/12/2018 1536  UROBILINOGEN 0.2 03/04/2017 1837   NITRITE NEGATIVE 12/12/2018 1536   LEUKOCYTESUR SMALL (A) 12/12/2018 1536   Sepsis Labs Recent Labs  Lab 03/12/23 1552 03/13/23 0456 03/14/23 0405  WBC 10.3 9.3 7.0   Microbiology No results found for this or any previous visit (from the past 240 hour(s)).   Time coordinating discharge: 35 minutes  SIGNED:   Glade LloydKshitiz Chandler Stofer, MD  [facility] 03/14/2023, 10:05 AM

## 2023-03-14 NOTE — Progress Notes (Signed)
Discharge instructions given to patient and all questions were answered.  

## 2023-03-14 NOTE — TOC Transition Note (Signed)
Transition of Care [facility]) - CM/SW Discharge Note   Patient Details  Name: [name] MRN: [id number] Date of Birth: 1961-10-14  Transition of Care [facility] CM/SW Contact:  Amada Jupiter, LCSW Phone Number: 03/14/2023, 11:57 AM   Clinical Narrative:     Met with pt and daughter this morning to review for any dc planning needs.  Pt request ambulance assistance with dc home - [facility] called at 12noon.  Pt denies any additional needs.  [facility] and daughter provides daily wound care.  No further TOC needs.   Final next level of care: Home/Self Care Barriers to Discharge: No Barriers Identified   Patient Goals and CMS Choice      Discharge Placement                         Discharge Plan and Services Additional resources added to the After Visit Summary for                  DME Arranged: N/A DME Agency: NA                  Social Determinants of Health (SDOH) Interventions SDOH Screenings   Food Insecurity: Patient Declined (03/13/2023)  Housing: Low Risk  (03/13/2023)  Transportation Needs: No Transportation Needs (03/13/2023)  Utilities: Not At Risk (03/13/2023)  Alcohol Screen: Low Risk  (02/26/2020)  Depression (PHQ2-9): Low Risk  (02/25/2020)  Tobacco Use: Low Risk  (03/13/2023)     Readmission Risk Interventions    03/14/2023   11:56 AM  Readmission Risk Prevention Plan  Transportation Screening Complete  PCP or Specialist Appt within 3-5 Days Complete  HRI or Home Care Consult Complete  Social Work Consult for Recovery Care Planning/Counseling Complete  Palliative Care Screening Not Applicable  Medication Review Oceanographer) Complete

## 2023-03-14 NOTE — Progress Notes (Addendum)
Patient ID: [name], female   DOB: [birth year], 62 y.o.   MRN: [id number]    Progress Note   Subjective   Day # 2 CC: Dysphagia  Diflucan p.o.  Biopsy pending  EGD 4/7-1 benign moderate stricture time 20 cm from the incisors measured 9 mm-Savary dilated from 14-16, nonbleeding diverticulum with moderate-sized opening and no stigmata of recent bleeding at the upper esophageal sphincter, small hiatal hernia, localized white plaques in the midesophagus biopsies taken.  Patient says she feels better, tolerating liquids without any difficulty, can already tell that she is able to swallow better, would like to go home today   Objective   Vital signs in last 24 hours: Temp:  [97.7 F (36.5 C)-99 F (37.2 C)] 98.6 F (37 C) (04/08 0644) Pulse Rate:  [90-96] 93 (04/08 0644) Resp:  [11-32] 18 (04/08 0644) BP: (109-153)/(50-76) 138/58 (04/08 0644) SpO2:  [95 %-100 %] 96 % (04/08 0644) Weight:  [129.7 kg] 129.7 kg (04/07 1049)   General:    African-American female in NAD Heart:  Regular rate and rhythm; no murmurs Lungs: Respirations even and unlabored, lungs CTA bilaterally Abdomen:  Soft, obese, nontender and nondistended. Normal bowel sounds. Extremities:  Without edema. Neurologic:  Alert and oriented,  grossly normal neurologically. Psych:  Cooperative. Normal mood and affect.  Intake/Output from previous day: 04/07 0701 - 04/08 0700 In: 593.3 [P.O.:120; I.V.:200; IV Piggyback:273.3] Out: 850 [Urine:850] Intake/Output this shift: No intake/output data recorded.  Lab Results: Recent Labs    03/12/23 1552 03/13/23 0456 03/14/23 0405  WBC 10.3 9.3 7.0  HGB 6.6* 7.4* 7.3*  HCT 23.4* 25.2* 25.3*  PLT 1,049* 967* 851*   BMET Recent Labs    03/12/23 1552 03/13/23 0456 03/14/23 0405  NA 133* 133* 135  K 4.0 3.7 3.7  CL 99 101 103  CO2 23 23 24   GLUCOSE 185* 173* 177*  BUN 13 11 7*  CREATININE 0.75 0.65 0.65  CALCIUM 8.7* 8.8* 8.6*   LFT Recent Labs     03/13/23 0456  PROT 7.0  ALBUMIN 2.6*  AST 9*  ALT 20  ALKPHOS 76  BILITOT 0.6   PT/INR No results for input(s): "LABPROT", "INR" in the last 72 hours.  Studies/Results: DG Chest Portable 1 View  Result Date: 03/12/2023 CLINICAL DATA:  dysphagia EXAM: PORTABLE CHEST 1 VIEW COMPARISON:  None Available. FINDINGS: Evaluation is limited by positioning. The cardiomediastinal silhouette is the upper limits of normal in contour. No pleural effusion. No pneumothorax. No acute pleuroparenchymal abnormality. IMPRESSION: 1. No acute cardiopulmonary abnormality. Electronically Signed   By: Meda Klinefelter M.D.   On: 03/12/2023 16:22       Assessment / Plan:    #39 62 year old African-American female admitted with dysphagia to solids and medications.  Onset of symptoms about a week prior to admission.  No prior history of esophageal stricture or chronic GERD.  EGD yesterday with finding of esophageal stricture as outlined above status post Savary dilation to 18 mm and also noted what appears to be mild candidiasis and a distal esophageal diverticulum.  #2 multiple sclerosis-very limited mobility/wheelchair #3 morbid obesity #4 history of endometrial cancer status post hysterectomy and radiation therapy #5 chronic anemia and thrombocytosis #6 history of sacral ulcers 7.  Osteoarthritis  Plan; advance to soft diet, I discussed continuing soft diet over the next few days with the patient with gradual advancement and avoidance of any larger pieces of meat or chunks of harder foods.  We discussed cutting food  into smaller pieces chewing carefully and sips between bites  She is okay from GI perspective to be discharged home today Complete a 1 week course of Diflucan 100 mg p.o. daily Start daily PPI Protonix 40 mg or omeprazole 40 mg p.o. every morning  Will arrange for outpatient follow-up with Dr. Myrtie Neitheranis in 6 to 8 weeks.  Patient advised to call should she have any difficulty in the  interim.     Principal Problem:   Dysphagia Active Problems:   Essential hypertension   Multiple sclerosis   Mixed hyperlipidemia   Type 2 diabetes mellitus with diabetic polyneuropathy, without long-term current use of insulin   Morbid obesity   Pressure injury of sacral region, stage 3   Dyslipidemia   Thrombocytosis   Microcytic anemia   Esophageal stricture     LOS: 2 days   Amy Esterwood PA-C 03/14/2023, 9:17 AM

## 2023-03-15 NOTE — Telephone Encounter (Signed)
Called and spoke with patient to schedule hospital follow up appt. Pt requested a Wednesday or Friday appt and Dr. Myrtie Neither is only in the office in the mornings on those days. Patient has been scheduled for a f/u with Dr. Myrtie Neither on Wednesday, 05/11/23 at 11 am. Pt requested that I mail her appt information. She has been advised to contact the office if she needs to reschedule for any reason. Pt verbalized understanding and had no concerns at the end of the call.

## 2023-03-16 ENCOUNTER — Encounter (HOSPITAL_COMMUNITY): Payer: Self-pay | Admitting: Gastroenterology

## 2023-03-16 LAB — SURGICAL PATHOLOGY

## 2023-03-17 ENCOUNTER — Encounter: Payer: Self-pay | Admitting: Gastroenterology

## 2023-04-06 ENCOUNTER — Encounter: Payer: Self-pay | Admitting: Podiatry

## 2023-04-06 ENCOUNTER — Ambulatory Visit (INDEPENDENT_AMBULATORY_CARE_PROVIDER_SITE_OTHER): Payer: 59 | Admitting: Podiatry

## 2023-04-06 VITALS — BP 117/66

## 2023-04-06 DIAGNOSIS — L84 Corns and callosities: Secondary | ICD-10-CM

## 2023-04-06 DIAGNOSIS — B351 Tinea unguium: Secondary | ICD-10-CM

## 2023-04-06 DIAGNOSIS — E0842 Diabetes mellitus due to underlying condition with diabetic polyneuropathy: Secondary | ICD-10-CM

## 2023-04-06 NOTE — Progress Notes (Signed)
  Subjective:  Patient ID: [name], female    DOB: [birth year],  MRN: [id number]  [name] presents to clinic today for at risk foot care with history of diabetic neuropathy  Chief Complaint  Patient presents with   Nail Problem    [name] BS-did not check today A1C-6.? PCP-[name] PCP VST-02/2023    New problem(s): None.   PCP is Alexandria Oar, PA.  Allergies  Allergen Reactions   Ace Inhibitors Swelling and Other (See Comments)    Angioedema    Lisinopril Swelling and Other (See Comments)    Angioedema     Review of Systems: Negative except as noted in the HPI.  Objective: No changes noted in today's physical examination. Vitals:   04/06/23 1322  BP: 117/66   [name] is a pleasant 62 y.o. female morbidly obese in NAD. AAO x 3.  Vascular Examination: CFT <4 seconds b/l LE. Palpable pedal pulses b/l LE. Pedal hair absent. No pain with calf compression b/l. Lower extremity skin temperature gradient within normal limits. Dependent edema noted b/l LE. No varicosities noted. No ischemia or gangrene noted b/l LE. No cyanosis or clubbing noted b/l LE.  Dermatological Examination: Pedal skin is warm and supple b/l LE. No open wounds b/l LE. No interdigital macerations noted b/l LE.   Toenails 1-5 bilaterally elongated, discolored, dystrophic, thickened, and crumbly with subungual debris and tenderness to dorsal palpation.   Hyperkeratotic lesion(s) submet head 5 left foot.  No erythema, no edema, no drainage, no fluctuance.  Neurological Examination: Pt has subjective symptoms of neuropathy. Protective sensation intact 5/5 intact bilaterally with 10g monofilament b/l. Vibratory sensation intact b/l.  Musculoskeletal Examination: Lower extremity muscles are noted to be flaccid bilaterally. No pain, crepitus or joint limitation noted with ROM bilateral LE. Utilizes motorized chair for mobility assistance.  Assessment/Plan: 1. Onychomycosis   2. Callus   3.  Diabetic polyneuropathy associated with diabetes mellitus due to underlying condition (HCC)   -Consent given for treatment as described below: -Examined patient. -Patient to continue soft, supportive shoe gear daily. -Toenails 1-5 b/l were debrided in length and girth with sterile nail nippers and dremel without iatrogenic bleeding.  -Callus(es) submet head 5 left foot pared utilizing rotary bur without complication or incident. Total number pared =1. -Patient/POA to call should there be question/concern in the interim.   Return in about 3 months (around 07/07/2023).  Freddie Breech, DPM

## 2023-04-13 ENCOUNTER — Other Ambulatory Visit: Payer: Self-pay

## 2023-04-13 MED ORDER — BACLOFEN 20 MG PO TABS: 20.0000 mg | ORAL_TABLET | ORAL | 2 refills | Status: DC

## 2023-05-11 ENCOUNTER — Encounter: Payer: Self-pay | Admitting: Gastroenterology

## 2023-05-11 ENCOUNTER — Ambulatory Visit (INDEPENDENT_AMBULATORY_CARE_PROVIDER_SITE_OTHER): Payer: 59 | Admitting: Gastroenterology

## 2023-05-11 VITALS — BP 130/76 | HR 93 | Ht 67.0 in | Wt 288.0 lb

## 2023-05-11 DIAGNOSIS — R1313 Dysphagia, pharyngeal phase: Secondary | ICD-10-CM

## 2023-05-11 DIAGNOSIS — K225 Diverticulum of esophagus, acquired: Secondary | ICD-10-CM | POA: Diagnosis not present

## 2023-05-11 NOTE — Progress Notes (Signed)
[facility] Consult Note:  History: [name] 05/11/2023  Referring provider: Porfirio Oar, PA  Reason for consult/chief complaint: Dysphagia (8 week follow up: still having trouble swallowing)   Subjective  HPI: [name] saw me in the office once in March 2023 for chronic constipation, bloating and nausea. She returns today for follow-up after a hospitalization 2 months ago, discharge summary with the following: "62 y.o. female with medical history significant of multiple sclerosis, morbid obesity, mixed hyperlipidemia, hypertension, endometrial cancer, history of hysterectomy and radiation therapy, GERD, fibroids, type 2 diabetes with diabetic polyneuropathy, class III obesity/morbid obesity, chronic sacral ulcers who follows up at wound care center, chronic anemia, thrombocytosis, slow transit constipation presented with worsening dysphagia with inability to swallow solid foods.  GI was consulted.  She underwent EGD on 03/13/2023 which showed proximal esophageal stenosis which was dilated; Zenker's diverticulum at the upper esophageal sphincter and esophageal plaques consistent with candidiasis.  She was started on Diflucan.  Patient subsequently is feeling better and tolerating diet.  She wants to go home today.  GI has cleared the patient for discharge.  Discharge patient home today with outpatient follow-up with GI. "  She was hospitalized at a local health system a few weeks back for systemic infection related to chronic sacral decubitus ulcer and osteomyelitis.  She received antibiotics and surgical debridement.  Report of inpatient EGD by Dr. Russella Dar reviewed-the proximal esophageal stricture was a result of an adjacent to a sizable Zenker's diverticular opening.  Barium study and outpatient ENT referral recommended, no barium study report found in the [facility] health epic system.  She reports having had it done during the subsequent hospital stay (see report  below).  Discharge summary indicates this patient was prescribed a course of fluconazole. ___________________________________   [name] reports ongoing difficulty with dysphagia to some of her medicines and many solid foods.  They feel stuck in the neck and she may have to bring it back up.  Things improved very little after the endoscopic dilation and fluconazole therapy noted above.  She is clearly frustrated by this and hopes to have it taken care of soon as possible.  She tells me that her primary care provider [name], [place]) referred her to an ENT practice and she is waiting to hear from them for an appointment.   ROS:  Review of Systems Denies chest pain or dyspnea Ongoing pelvic/sacral pain from decubitus ulcer osteomyelitis noted above  Past Medical History: Past Medical History:  Diagnosis Date   Anemia    history of   Arthritis    Decubitus ulcers    currently treated at wound center   Diabetes mellitus without complication (HCC)    Diabetic polyneuropathy (HCC)    Endometrial cancer (HCC)    Grade 2   Fibroids    GERD (gastroesophageal reflux disease)    Heartburn    Occ   History of radiation therapy 03/20/2019   02/14/2019 - 03/20/2019 Pelvis / 45 Gy in 25 fractions  Dr Antony Blackbird   History of radiation therapy 04/11/2019   03/27/2019, 04/04/2019, 04/11/2019 (HDR, [facility]achy): Vaginal Cuff (cylinder, 3 cm diameter) / 18 Gy in 3 fractions (3 cm tx length) Dr Antony Blackbird   Hypertension    Mixed hyperlipidemia    Morbid obesity (HCC)    MS (multiple sclerosis) (HCC)      Past Surgical History: Past Surgical History:  Procedure Laterality Date   BIOPSY  03/13/2023   Procedure: BIOPSY;  Surgeon: Claudette Head  T, MD;  Location: [facility];  Service: Gastroenterology;;   BRAIN TUMOR EXCISION  09/2020   COLONOSCOPY     CYSTOSCOPY N/A 01/22/2020   Procedure: CYSTOSCOPY WITH SUPRA PUBIC TUBE CHANGE, BOTOX 200 UNITS, RETROGRADE PYELOGRAM BILATERAL.  FULGERATION .5 CM-2CM;  Surgeon: Jerilee Field, MD;  Location: [facility];  Service: Urology;  Laterality: N/A;   ESOPHAGEAL DILATION  03/13/2023   Procedure: ESOPHAGEAL DILATION;  Surgeon: Meryl Dare, MD;  Location: [facility];  Service: Gastroenterology;;  savory   ESOPHAGOGASTRODUODENOSCOPY N/A 12/02/2017   Procedure: ESOPHAGOGASTRODUODENOSCOPY (EGD);  Surgeon: Kerin Salen, MD;  Location: [facility];  Service: Gastroenterology;  Laterality: N/A;   ESOPHAGOGASTRODUODENOSCOPY (EGD) WITH PROPOFOL N/A 03/13/2023   Procedure: ESOPHAGOGASTRODUODENOSCOPY (EGD) WITH PROPOFOL;  Surgeon: Meryl Dare, MD;  Location: [facility];  Service: Gastroenterology;  Laterality: N/A;   IRRIGATION AND DEBRIDEMENT BUTTOCKS Right 02/2023   ROBOTIC ASSISTED TOTAL HYSTERECTOMY WITH BILATERAL SALPINGO OOPHERECTOMY Bilateral 12/19/2018   Procedure: XI ROBOTIC ASSISTED TOTAL HYSTERECTOMY (UTERUS GREATER THAN 250gr) WITH BILATERAL SALPINGO OOPHORECTOMY;  Surgeon: Adolphus Birchwood, MD;  Location: [facility];  Service: Gynecology;  Laterality: Bilateral;   SENTINEL NODE BIOPSY N/A 12/19/2018   Procedure: SENTINEL NODE BIOPSY;  Surgeon: Adolphus Birchwood, MD;  Location: [facility];  Service: Gynecology;  Laterality: N/A;   SUPRAPUBIC CATHETER INSERTION  2009   TONSILLECTOMY     TUBAL LIGATION       Family History: Family History  Problem Relation Age of Onset   Hypertension Mother    Breast cancer Mother        spread all over   Heart disease Father    Diabetes Father    Hypertension Sister    Hypertension Brother    Colon cancer Neg Hx    Esophageal cancer Neg Hx    Rectal cancer Neg Hx     Social History: Social History   Socioeconomic History   Marital status: Single    Spouse name: Not on file   Number of children: 3   Years of education: 14   Highest education level: Not on file  Occupational History   Occupation: Disabled  Tobacco Use   Smoking status: Never   Smokeless tobacco: Never  Vaping Use    Vaping Use: Never used  Substance and Sexual Activity   Alcohol use: No   Drug use: No   Sexual activity: Not on file  Other Topics Concern   Not on file  Social History Narrative   Right handed   Soda- 1 can per day   Social Determinants of Health   Financial Resource Strain: Not on file  Food Insecurity: Patient Declined (03/13/2023)   Hunger Vital Sign    Worried About Running Out of Food in the Last Year: Patient declined    Ran Out of Food in the Last Year: Patient declined  Transportation Needs: No Transportation Needs (03/13/2023)   PRAPARE - Administrator, Civil Service (Medical): No    Lack of Transportation (Non-Medical): No  Physical Activity: Not on file  Stress: Not on file  Social Connections: Not on file    Allergies: Allergies  Allergen Reactions   Ace Inhibitors Swelling and Other (See Comments)    Angioedema    Lisinopril Swelling and Other (See Comments)    Angioedema     Outpatient Meds: Current Outpatient Medications  Medication Sig Dispense Refill   ACCU-CHEK GUIDE test strip TEST 1 ONCE DAILY.     atorvastatin (LIPITOR) 20 MG tablet  Take 20 mg by mouth at bedtime.     baclofen (LIORESAL) 20 MG tablet Take 1-2 tablets (20-40 mg total) by mouth See admin instructions. Take 20 mg by mouth once a day and 40 mg at bedtime 30 each 2   Cholecalciferol (VITAMIN D3 PO) Take 5,000 Units by mouth daily.     clotrimazole (LOTRIMIN) 1 % cream Apply to both feet and between toes twice daily for 4 weeks. 60 g 1   furosemide (LASIX) 20 MG tablet TAKE 1 TABLET BY MOUTH ONCE DAILY (Patient taking differently: Take 20 mg by mouth in the morning.) 90 tablet 3   glipiZIDE (GLUCOTROL) 5 MG tablet Take 1 tablet (5 mg total) by mouth daily before lunch. (Patient taking differently: Take 5 mg by mouth 2 (two) times daily before a meal.) 90 tablet 3   hydrochlorothiazide (HYDRODIURIL) 25 MG tablet TAKE 1 TABLET BY MOUTH ONCE DAILY EVERY MORNING (Patient taking  differently: Take 25 mg by mouth daily.) 90 tablet 0   levETIRAcetam (KEPPRA) 500 MG tablet Take 500 mg by mouth 2 (two) times daily.     lisinopril (ZESTRIL) 2.5 MG tablet Take 2.5 mg by mouth daily.     metFORMIN (GLUCOPHAGE) 1000 MG tablet Take 1,000 mg by mouth 2 (two) times daily with a meal.     NON FORMULARY Power wheelchair repairs  Diagnosis code z99.3 1 each 0   NON FORMULARY Dispense catheter urine drainage bags 90 Bag 3   Ostomy Supplies (NEW IMAGE SKIN/FLANGE/TAPE) MISC 1 application by Does not apply route daily as needed. 30 each 11   oxyCODONE-acetaminophen (PERCOCET) 10-325 MG tablet Take 1 tablet by mouth every 8 (eight) hours as needed for pain.     potassium chloride (MICRO-K) 10 MEQ CR capsule Take 30 mEq by mouth 2 (two) times daily.      prochlorperazine (COMPAZINE) 10 MG tablet Take 10 mg by mouth 2 (two) times daily as needed for nausea or vomiting.     sodium hypochlorite (DAKIN'S 1/2 STRENGTH) external solution Irrigate with 1 Application as directed See admin instructions. APPLY TOPICALLY, THEN LIGHTLY MOISTEN GAUZE AND APPLY TO WOUND AT BEDTIME DAILY     pantoprazole (PROTONIX) 40 MG tablet Take 1 tablet (40 mg total) by mouth daily. 30 tablet 0   No current facility-administered medications for this visit.      ___________________________________________________________________ Objective   Exam:  BP 130/76   Pulse 93   Ht 5\' 7"  (1.702 m)   Wt 288 lb (130.6 kg)   SpO2 97%   BMI 45.11 kg/m  Wt Readings from Last 3 Encounters:  05/11/23 288 lb (130.6 kg)  03/13/23 285 lb 15 oz (129.7 kg)  03/09/23 286 lb (129.7 kg)   Wheelchair-bound, visibly uncomfortable, nontoxic-appearing= Eyes: sclera anicteric, no redness ENT: oral mucosa moist without lesions, no cervical or supraclavicular lymphadenopathy CV: Regular without appreciable murmur, + peripheral edema Resp: clear to auscultation bilaterally, normal RR and effort noted GI: soft, no tenderness,  with active bowel sounds.  Limited exam in a wheelchair   Dr. Darcella Gasman upper endoscopy report was reviewed  Radiologic Studies:  Barium esophagram done in the [facility] during hospitalization on 04/21/2023  Limited study, as the patient could not stand and fully move for the exam. Within the limits of this study:  1.  Posterior diverticulum in the cervicothoracic region of the upper esophagus or hypopharynx.  2.  The 13 mm barium pill passed into the diverticulum and remained there despite  multiple administrations of water and barium. It should dissolve with time, and the patient was encouraged to drink liquid today to help it dissolve/pass.  3.  Suggestion of narrowing at the gastroesophageal junction.  4.  Severe esophageal dysmotility.   Assessment: Encounter Diagnoses  Name Primary?   Pharyngeal dysphagia Yes   Zenker's diverticulum     This patient's ongoing symptoms are clearly related to the Zenker's diverticulum based on her location of symptoms, endoscopic and radiographic findings. The EGD reported a proximal esophageal stricture, but looking at the photographs, this is clearly a compression of the esophageal lumen from the Zenker's diverticulum rather than a fibrotic stricture.  While in endoscopic wire-guided bougie dilation was performed resulting in expected mucosal rent, this clearly did not significantly improve her symptoms because of the ongoing anatomic abnormality.  There is no further endoscopic therapy to offer for that.  Despite the barium study results noted above, there was no distal stricture seen.  There is a concomitant nonspecific dysmotility seen on the barium study, not surprising given her medical condition, and that is probably worsening this issue as well.  However, her Zenker's diverticulum needs surgical or advanced endoscopic therapy by ENT (hopefully they will be able to do it endoscopically to cut the diverticular septum and open the  lumen.)  Meanwhile, it would be very helpful for her primary care provider to review all her meds and convert any to liquid form if possible, opening the capsules for sprinkling on fluid if appropriate and crush medicines that can be.  Even better would be if PCP can communicate with the ENT practice to which they referred [name] to convey the ASAP nature of this in order to prevent a food impaction that would risk aspiration and be very challenging to manage endoscopically.  Thank you for the courtesy of this consult.  Please call me with any questions or concerns.  [name] III  CC: Referring provider noted above

## 2023-05-11 NOTE — Patient Instructions (Signed)
_______________________________________________________  If your blood pressure at your visit was 140/90 or greater, please contact your primary care physician to follow up on this.  _______________________________________________________  If you are age 63 or older, your body mass index should be between 23-30. Your Body mass index is 45.11 kg/m. If this is out of the aforementioned range listed, please consider follow up with your Primary Care Provider.  If you are age 65 or younger, your body mass index should be between 19-25. Your Body mass index is 45.11 kg/m. If this is out of the aformentioned range listed, please consider follow up with your Primary Care Provider.   ________________________________________________________  The [facility] providers would like to encourage you to use [facility] to communicate with providers for non-urgent requests or questions.  Due to long hold times on the telephone, sending your provider a message by [facility] may be a faster and more efficient way to get a response.  Please allow 48 business hours for a response.  Please remember that this is for non-urgent requests.  _______________________________________________________  It was a pleasure to see you today!  Thank you for trusting me with your gastrointestinal care!

## 2023-06-27 ENCOUNTER — Ambulatory Visit
Admission: RE | Admit: 2023-06-27 | Discharge: 2023-06-27 | Disposition: A | Discharge status: Home / Self Care | Payer: 59 | Source: Ambulatory Visit | Attending: Radiation Oncology | Admitting: Radiation Oncology

## 2023-07-16 DIAGNOSIS — L89154 Pressure ulcer of sacral region, stage 4: Secondary | ICD-10-CM | POA: Diagnosis not present

## 2023-07-16 DIAGNOSIS — D649 Anemia, unspecified: Secondary | ICD-10-CM | POA: Diagnosis not present

## 2023-07-16 DIAGNOSIS — R079 Chest pain, unspecified: Secondary | ICD-10-CM | POA: Diagnosis not present

## 2023-07-17 DIAGNOSIS — L89154 Pressure ulcer of sacral region, stage 4: Secondary | ICD-10-CM | POA: Diagnosis not present

## 2023-07-17 DIAGNOSIS — R079 Chest pain, unspecified: Secondary | ICD-10-CM | POA: Diagnosis not present

## 2023-07-17 DIAGNOSIS — D649 Anemia, unspecified: Secondary | ICD-10-CM | POA: Diagnosis not present

## 2023-07-25 DIAGNOSIS — G894 Chronic pain syndrome: Secondary | ICD-10-CM

## 2023-07-25 DIAGNOSIS — J9621 Acute and chronic respiratory failure with hypoxia: Secondary | ICD-10-CM

## 2023-07-25 DIAGNOSIS — I4891 Unspecified atrial fibrillation: Secondary | ICD-10-CM | POA: Diagnosis not present

## 2023-07-25 DIAGNOSIS — L8994 Pressure ulcer of unspecified site, stage 4: Secondary | ICD-10-CM

## 2023-08-01 ENCOUNTER — Encounter: Payer: 59 | Admitting: Podiatry

## 2023-08-01 NOTE — Progress Notes (Signed)
 Patient was a no-show for today's scheduled appointment.  She will need to call and reschedule

## 2023-08-07 DEATH — deceased

## 2024-03-14 ENCOUNTER — Ambulatory Visit: Payer: 59 | Admitting: Neurology
